# Patient Record
Sex: Male | Born: 1935 | Race: White | Hispanic: No | Smoking: Former smoker
Health system: Southern US, Community
[De-identification: ages and names within clinical notes are randomized; demographics above are authoritative.]

## PROBLEM LIST (undated history)

## (undated) DIAGNOSIS — K219 Gastro-esophageal reflux disease without esophagitis: Secondary | ICD-10-CM

## (undated) DIAGNOSIS — I639 Cerebral infarction, unspecified: Secondary | ICD-10-CM

## (undated) DIAGNOSIS — E559 Vitamin D deficiency, unspecified: Secondary | ICD-10-CM

## (undated) DIAGNOSIS — M1990 Unspecified osteoarthritis, unspecified site: Secondary | ICD-10-CM

## (undated) DIAGNOSIS — G459 Transient cerebral ischemic attack, unspecified: Secondary | ICD-10-CM

## (undated) DIAGNOSIS — E291 Testicular hypofunction: Secondary | ICD-10-CM

## (undated) DIAGNOSIS — E785 Hyperlipidemia, unspecified: Secondary | ICD-10-CM

## (undated) DIAGNOSIS — R410 Disorientation, unspecified: Secondary | ICD-10-CM

## (undated) DIAGNOSIS — I10 Essential (primary) hypertension: Secondary | ICD-10-CM

## (undated) DIAGNOSIS — Z87898 Personal history of other specified conditions: Secondary | ICD-10-CM

## (undated) DIAGNOSIS — I6340 Cerebral infarction due to embolism of unspecified cerebral artery: Secondary | ICD-10-CM

## (undated) HISTORY — PX: BACK SURGERY: SHX140

## (undated) HISTORY — DX: Hyperlipidemia, unspecified: E78.5

## (undated) HISTORY — DX: Testicular hypofunction: E29.1

## (undated) HISTORY — DX: Vitamin D deficiency, unspecified: E55.9

## (undated) HISTORY — PX: HERNIA REPAIR: SHX51

---
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
## 2002-02-09 ENCOUNTER — Encounter: Admission: RE | Admit: 2002-02-09 | Discharge: 2002-02-09 | Payer: Self-pay | Admitting: Orthopedic Surgery

## 2002-02-09 ENCOUNTER — Encounter: Payer: Self-pay | Admitting: Orthopedic Surgery

## 2002-02-16 ENCOUNTER — Ambulatory Visit (HOSPITAL_COMMUNITY): Admission: RE | Admit: 2002-02-16 | Discharge: 2002-02-16 | Payer: Self-pay | Admitting: Gastroenterology

## 2002-06-02 ENCOUNTER — Inpatient Hospital Stay (HOSPITAL_COMMUNITY): Admission: RE | Admit: 2002-06-02 | Discharge: 2002-06-03 | Payer: Self-pay | Admitting: Neurosurgery

## 2003-07-06 ENCOUNTER — Encounter: Admission: RE | Admit: 2003-07-06 | Discharge: 2003-07-06 | Payer: Self-pay | Admitting: Orthopedic Surgery

## 2004-05-01 ENCOUNTER — Ambulatory Visit (HOSPITAL_COMMUNITY): Admission: RE | Admit: 2004-05-01 | Discharge: 2004-05-01 | Payer: Self-pay | Admitting: Orthopedic Surgery

## 2004-05-01 ENCOUNTER — Ambulatory Visit (HOSPITAL_BASED_OUTPATIENT_CLINIC_OR_DEPARTMENT_OTHER): Admission: RE | Admit: 2004-05-01 | Discharge: 2004-05-01 | Payer: Self-pay | Admitting: Orthopedic Surgery

## 2005-03-18 ENCOUNTER — Encounter: Admission: RE | Admit: 2005-03-18 | Discharge: 2005-03-18 | Payer: Self-pay | Admitting: Orthopedic Surgery

## 2005-03-31 ENCOUNTER — Encounter: Admission: RE | Admit: 2005-03-31 | Discharge: 2005-03-31 | Payer: Self-pay | Admitting: Orthopedic Surgery

## 2005-04-02 ENCOUNTER — Ambulatory Visit (HOSPITAL_BASED_OUTPATIENT_CLINIC_OR_DEPARTMENT_OTHER): Admission: RE | Admit: 2005-04-02 | Discharge: 2005-04-02 | Payer: Self-pay | Admitting: Orthopedic Surgery

## 2006-10-31 IMAGING — CR DG CHEST 2V
2 series · 2 of 2 positions shown · non-contrast
Comparison: [HOSPITAL], 05/31/02.

CLINICAL DATA: Hypertension.  Preop respiratory exam for tendinopathy.
 TWO VIEW CHEST:

[w chest pa]
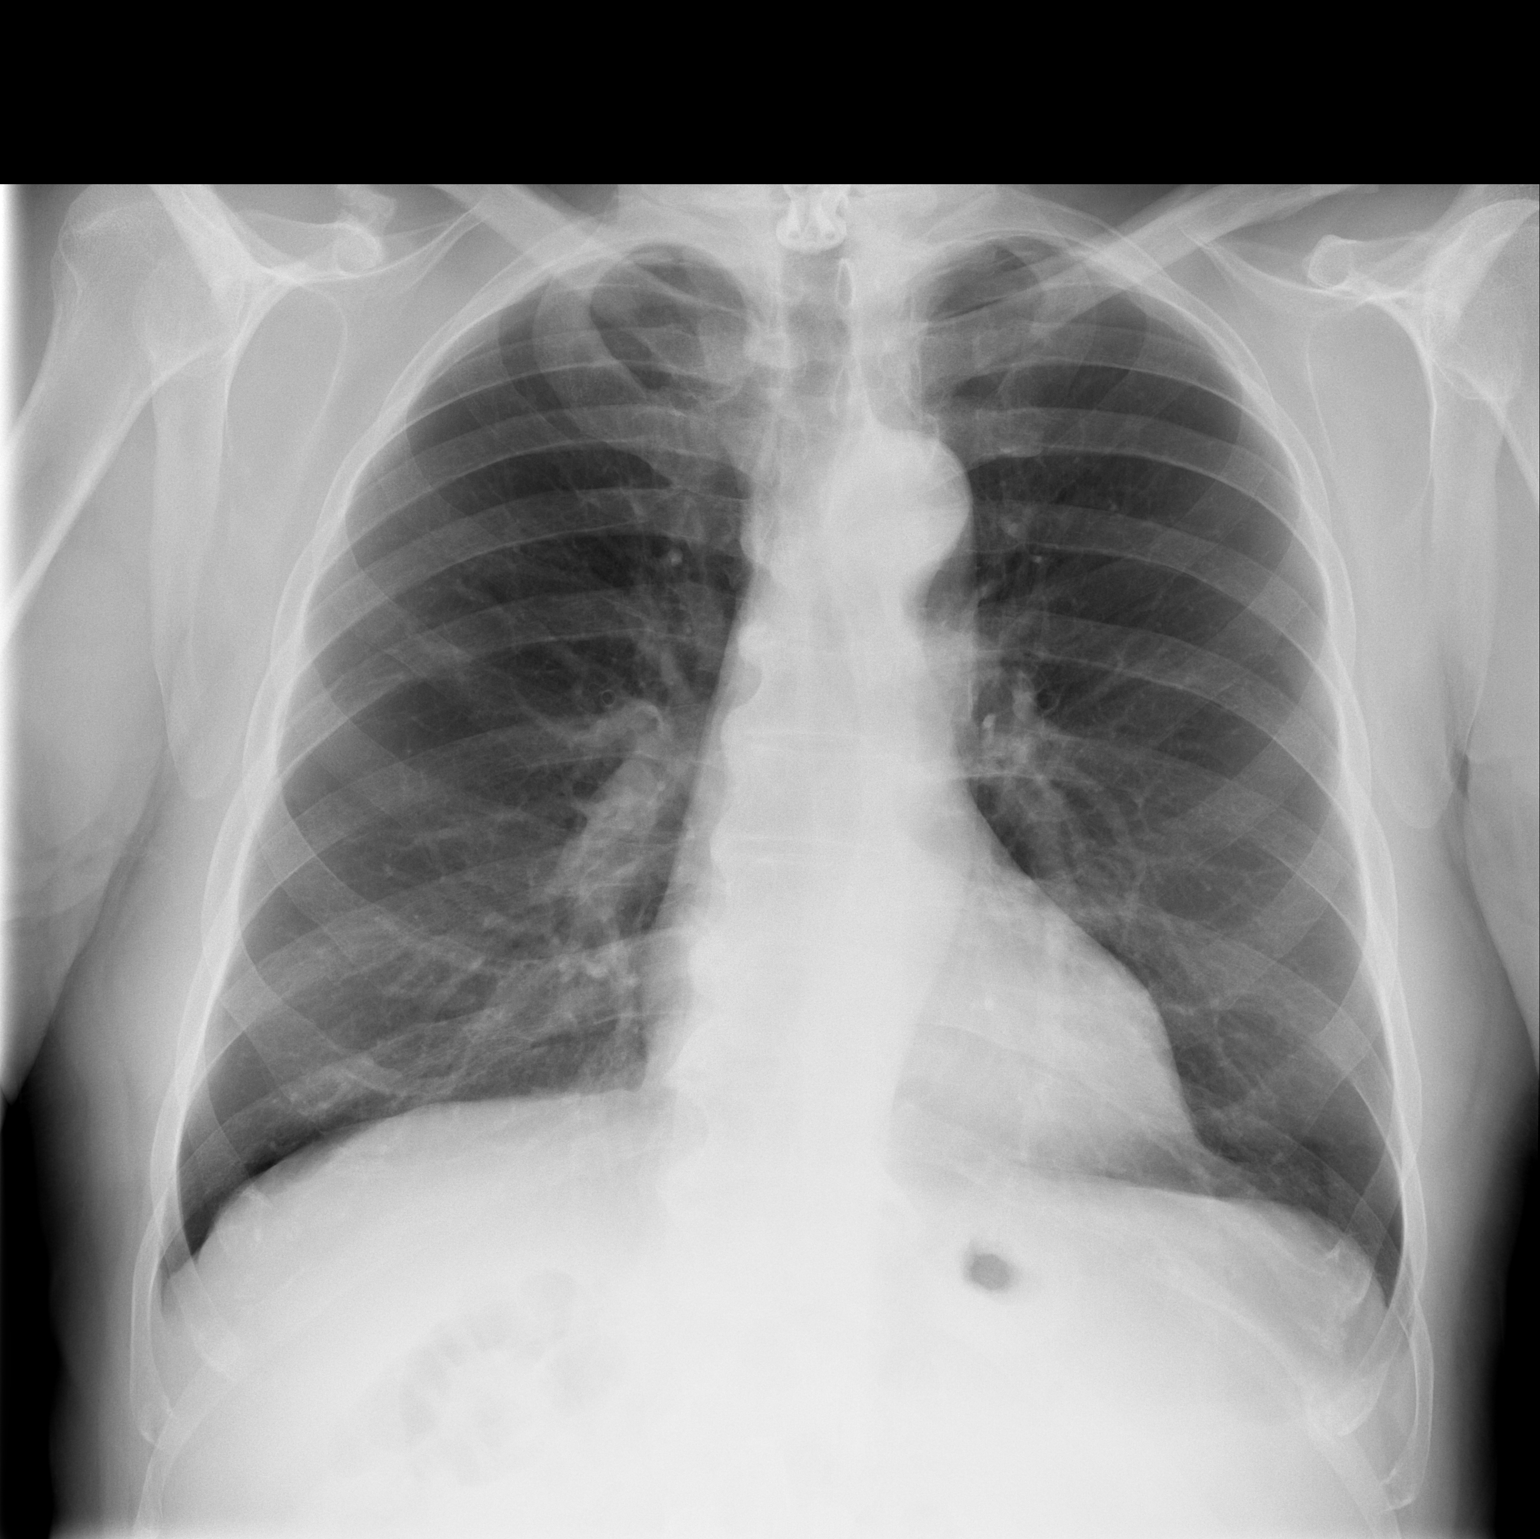

[w chest lat]
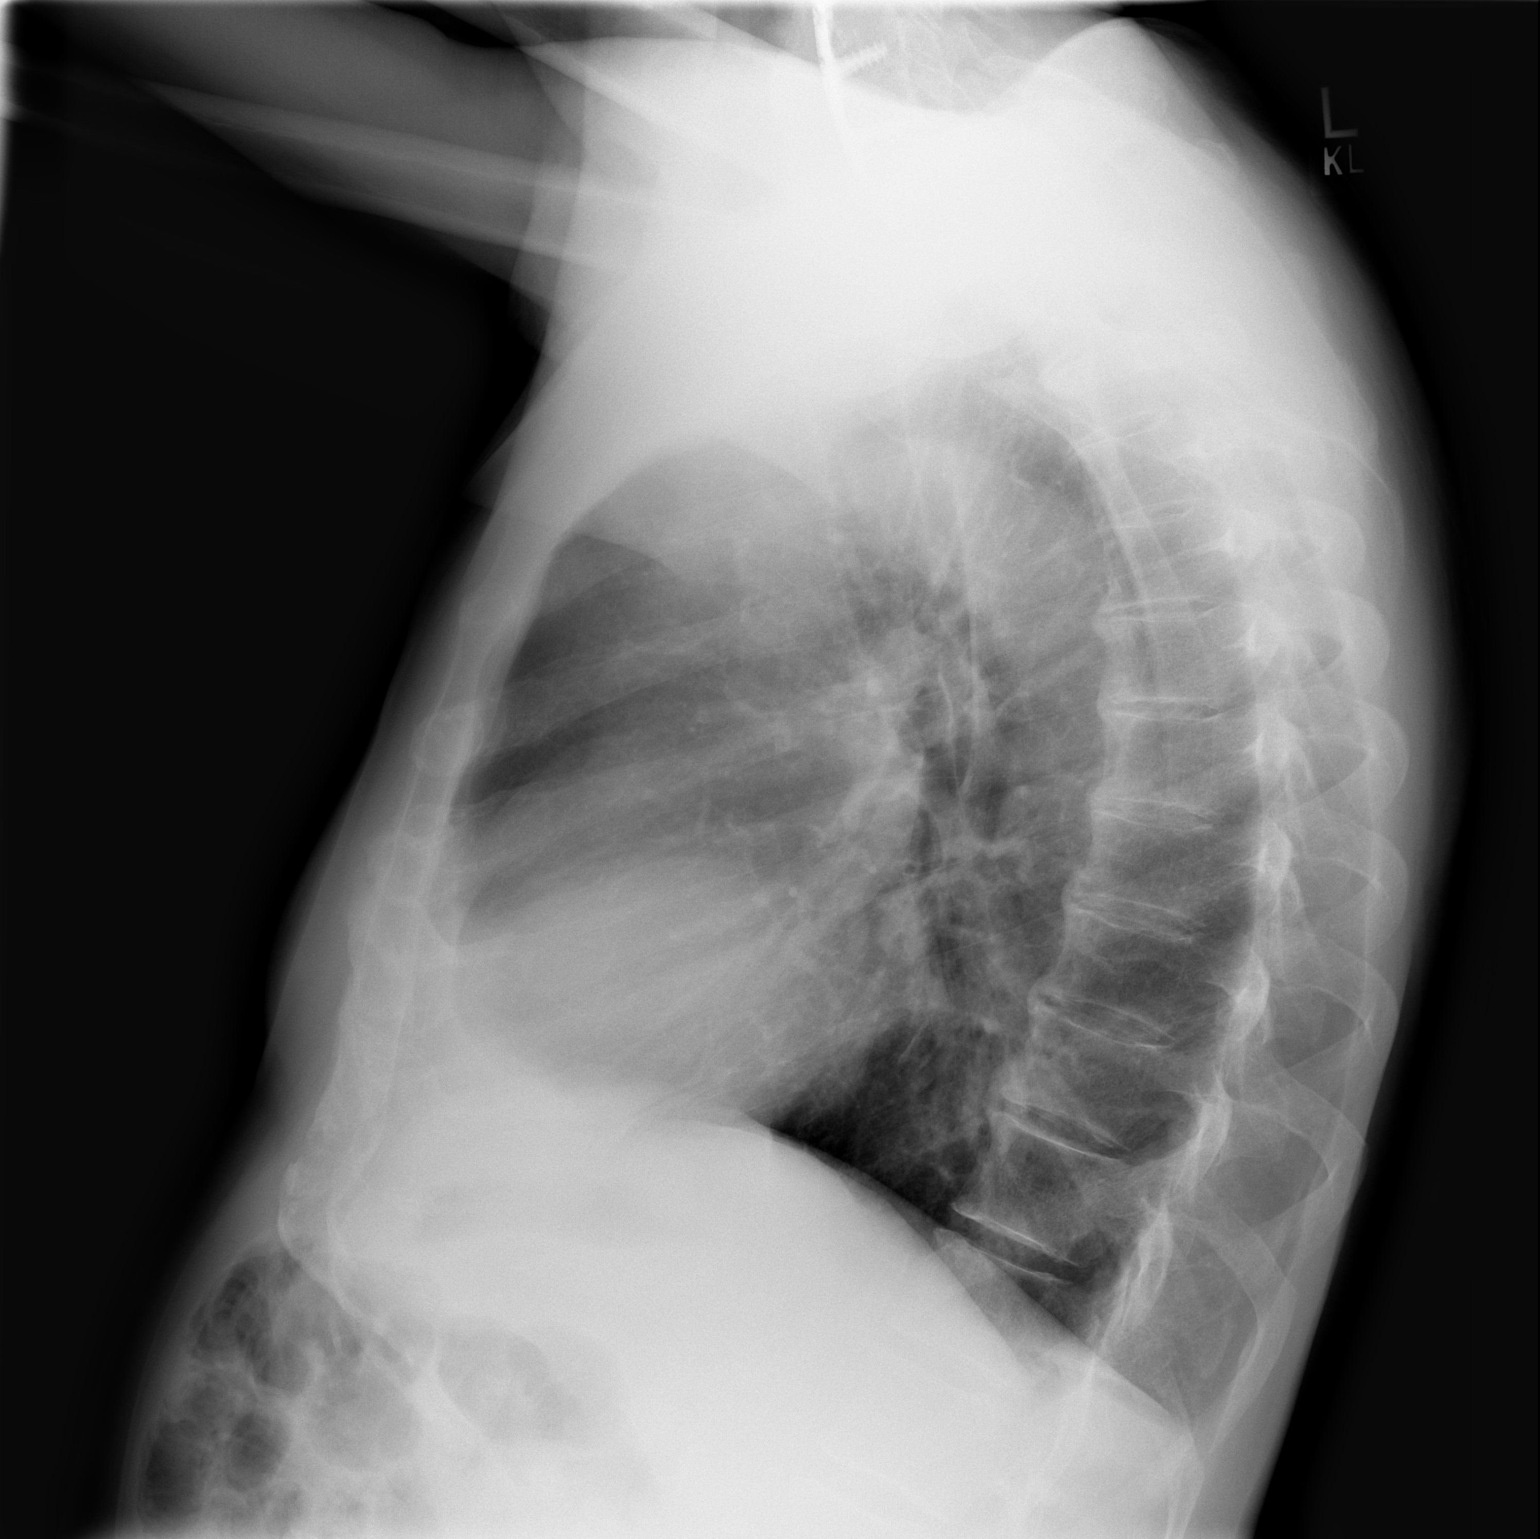

[2 of 2 positions shown; findings below may reference images not displayed]

The lungs are clear, and the heart and mediastinal structures are normal.  There has been a previous lower cervical fusion intervally.
IMPRESSION: No evidence for active chest disease.

## 2007-06-29 LAB — HM COLONOSCOPY

## 2009-08-16 ENCOUNTER — Encounter: Admission: RE | Admit: 2009-08-16 | Discharge: 2009-08-16 | Payer: Self-pay | Admitting: Orthopaedic Surgery

## 2010-03-31 ENCOUNTER — Encounter: Payer: Self-pay | Admitting: Orthopaedic Surgery

## 2010-07-26 NOTE — Op Note (Signed)
[name], Lance Ramirez               ACCOUNT NO.:  [id number]   MEDICAL RECORD NO.:  [id number]          PATIENT TYPE:  AMB   LOCATION:  [place]                          FACILITY:  [facility]   PHYSICIAN:  Estill Bamberg. Ronnie Derby, M.D. DATE OF BIRTH:  1933-05-30   DATE OF PROCEDURE:  05/01/2004  DATE OF DISCHARGE:                                 OPERATIVE REPORT   PREOPERATIVE DIAGNOSIS:  Right shoulder impingement syndrome with labral  tearing.   POSTOPERATIVE DIAGNOSIS:  Right shoulder impingement syndrome with labral  tearing.   OPERATION PERFORMED:  Right shoulder arthroscopy with glenohumeral  debridement, subacromial decompression and distal clavicle resection.   SURGEON:  Estill Bamberg. Ronnie Derby, M.D.   ASSISTANT:  None.   ANESTHESIA:  General plus preoperative interscalene block.   INDICATIONS FOR PROCEDURE:  The patient is a 77 year old white male with  failure of conservative management for shoulder impingement syndrome and MRI  evidence of shoulder pathology including labral tearing, rotator cuff  tendinosis and acromioclavicular joint changes.  Informed consent was  obtained.   DESCRIPTION OF PROCEDURE:  The patient was taken to the operating room and  administered general anesthesia after a preop interscalene block.  She was  then placed in the beach chair position.  After sterile prep and drape,  anterior, posterior and direct lateral portals were created with a #11  blade, blunt trocar and cannula.  Diagnostic arthroscopy revealed some  anterior labral tearing and detachment.  This was debrided through the  anterior portal and an ArthroCare debridement wand was used to __________  this back to a stable rim.  I did not feel a repair was necessary.  It was  more degenerative and torn from its attachment.  The undersurface of the  rotator cuff appeared normal.  There was some interval tearing which I  debrided as well.  I then redirected the scope from the posterior portal  into the  subacromial space and from the direct lateral portal performed a  bursectomy.  There was an incredible amount of impingement and spurring at  both the acromioclavicular joint and the anterolateral acromion.  A 4.0 mm  cylindrical bur was used to perform an anterolateral acromioplasty and a  distal clavicle resection.  This was very, very aggressive.  He had a very,  very broad coracoacromial ligament as well and this was debrided as well.  This made for an extreme amount of decompression.  After completing the  bursectomy and CA ligament resection and adequate burring, I then evacuated  the fluid and instruments and closed with interrupted nylon, dressed with  Xeroform dressing sponges, sterile Webril and 2 inch silk tape and simple  sling.   COMPLICATIONS:  None.   DRAINS:  None.      SDL/MEDQ  D:  05/01/2004  T:  05/01/2004  Job:  [id number]

## 2010-07-26 NOTE — Op Note (Signed)
Lance Ramirez, Lance Ramirez               ACCOUNT NO.:  [id number]   MEDICAL RECORD NO.:  [id number]          PATIENT TYPE:  AMB   LOCATION:  [place]                          FACILITY:  [facility]   PHYSICIAN:  Estill Bamberg. Ronnie Derby, M.D. DATE OF BIRTH:  1933-04-30   DATE OF PROCEDURE:  04/02/2005  DATE OF DISCHARGE:                                 OPERATIVE REPORT   SURGEON:  Estill Bamberg. Ronnie Derby, M.D.   ASSISTANT:  None.   PREOPERATIVE DIAGNOSIS:  Left shoulder impingement syndrome,  acromioclavicular arthritis, labral tear.   POSTOPERATIVE DIAGNOSIS:  Left shoulder impingement syndrome,  acromioclavicular arthritis, labral tear.   PROCEDURE:  Left shoulder arthroscopy, glenohumeral debridement, subacromial  decompression, distal clavicle resection.   INDICATIONS FOR PROCEDURE:  The patient is a 77 year old with an MRI showing  tendinosus, labral tearing, and radiographic evidence of AC joint arthritis  and clinical evidence of shoulder impingement syndrome.  Informed consent  was obtained.   DESCRIPTION OF PROCEDURE:  The patient was laid supine, administered general  anesthesia, then placed in the beach chair position.  He was administered  interscalene block in the pre-anesthesia holding area, as well.  The left  shoulder was prepped and draped in the usual sterile fashion.  Anterior, posterior, and direct portals were created with a number 11 blade,  blunt trocar, and cannula.  Diagnostic arthroscopy of the glenohumeral joint  revealed minimal arthritis, however, large anterior labral tear which was  very, very degenerative and not repairable.  The great white shaver was used  for the anterior portal to debride it and then the 3 mm capture wand was  used to further debride the tissue back to a blunt, stable rim.  I then went  into the subacromial space from posterior.  I performed a bursectomy, which  there was a lot of bursitis, and I did this with the 3.2 Cuda shaver.  I  then used the  ArthroCare debridement wand to debride the under surface of  the acromion, release the CA ligament, and debride the under surface of the  [facility] joint, as well.  I then used the 4 mm cylindrical bur to perform  aggressive anterolateral acromioplasty as well as a distal clavicle  resection.  I then completed the bursectomy, evaluated the rotator cuff,  there were no full thickness tears, just evidence of some partial tearing.  I then evacuated the subacromial space of fluid and instruments.  I closed  with 4-0 nylon sutures and dressed with Xeroform, dressing sponges, ABDs, 2  inch silk tape, and a simple sling.  Complications none.  Drains none.           ______________________________  Estill Bamberg. Ronnie Derby, M.D.     SDL/MEDQ  D:  04/02/2005  T:  04/02/2005  Job:  [id number]

## 2010-07-26 NOTE — Op Note (Signed)
NAMEBASILIO, SCHLICHTING NO.:  [id number]   MEDICAL RECORD NO.:  [id number]                   PATIENT TYPE:  INP   LOCATION:  3010                                 FACILITY:  [facility]   PHYSICIAN:  Ophelia Charter, M.D.            DATE OF BIRTH:  27-Feb-1934   DATE OF PROCEDURE:  DATE OF DISCHARGE:  06/03/2002                                 OPERATIVE REPORT   BRIEF HISTORY:  The patient is a 77 year old white male who has suffered  from neck and left shoulder and arm pain.  He failed medical management and  was worked up by cervical MRI which demonstrated spondylosis, herniated  disk, stenosis centered at C4-5, C5-6 and C6-7.  I discussed the various  treatment options with him including surgery.  The patient weighed the  risks, benefits and alternatives of surgery and decided to proceed with  anterior cervical discectomy, fusion and plating.   PREOPERATIVE DIAGNOSIS:  C4-5, C5-6 and C6-7 herniated nucleus pulposus with  spondylosis, stenosis, cervical radiculopathy and cervicalgia.   POSTOPERATIVE DIAGNOSIS:  C4-5, C5-6 and C6-7 herniated nucleus pulposus  with spondylosis, stenosis, cervical radiculopathy and cervicalgia.   PROCEDURE:  C4-5, C5-6 and C6-7 extensive anterior cervical  diskectomy/decompression; interbody iliac crest Allograft arthrodesis;  anterior cervical plating C4 to C7 with Premier titanium plate and screws.   SURGEON:  Ophelia Charter, M.D.   ASSISTANT:  None.   ANESTHESIA:  General endotracheal anesthesia.   ESTIMATED BLOOD LOSS:  150 mL.   SPECIMENS:  None.   DRAINS:  None.   COMPLICATIONS:  None.   DESCRIPTION OF PROCEDURE:  The patient was brought to the operating room by  the anesthesia team.  General endotracheal anesthesia was induced.  The  patient remained in the supine position.  His anterior cervical region was  then prepared with Betadine scrub and Betadine solution and sterile drapes  were applied.  I  then injected the area to be incised with Marcaine with  epinephrine solution. I used a scalpel to make a transverse incision in the  patient's left anterior neck.  I then used Metzenbaum scissors to divide the  platysma muscle and then dissected medial to the sternocleidomastoid muscle,  the jugular vein and carotid artery.  I bluntly dissected down towards the  anterior cervical spine.  I carefully identified the esophagus and retracted  it medially and then probed the soft tissue of the anterior cervical spine  using Kitner swabs.  I then inserted a bent spinal needle at the uppermost  interspace and took an intraoperative radiograph to confirm my location.   I then used electrocautery to detach the medial border of the longus coli  muscle bilaterally and entered the disk space at C4-5, C5-6 and C6-7 and  then inserted the Caspar self retaining retractor for exposure.  I began at  C5-6 and I incised the C5-6 intervertebral disk and performed  a partial  diskectomy using pituitary forceps and the Carlens curettes.  I then  inserted distraction screws at C5 and C6 and distracted the interspace and  then used a high speed drill to drill away some retrospondylosis, drill away  the intervertebral disk, decorticate the vertebral end plates, and then to  thin out the posterior longitudinal ligament and remove some post  spondylosis.  I then incised the ligament with the arachnoid knife and then  removed it with the Kerrison punch.  After cutting the vertebral end plates  at 075-GRM  and decompressing the thecal sac at this level, I then performed a  generous foraminotomy about the bilateral  5-6 nerve root completing  decompression at this level.   This procedure was repeated in an identical fashion first at C6-7 and at C4-  5 decompressing both of these levels.   I now turned my attention to the arthrodesis.  I obtained iliac crest  Allograft bone graft and fashioned it to these approximate  dimensions:  7 mm  in height, 1 cm in depth.  I inserted the first bone graft into the  distracted C4-5 interspace and then removed the distraction screw from C4  and placed it to C6, distracted the C5-6, placed the second bone graft at C5-  6 and then removed the distraction screw from C5, placed it in C7,  distracted the C6-7, placed a third bone graft in the C6-7 interspace.  I  then removed the distraction screws and there was a good snug fit of the  bone graft at each level.   I now turned my attention to the anterior __________.  I used the drill to  draw away some retrospondylosis from the vertebral end plates so that the  plate would lay down flat.  I then selected the appropriate size Premier  anterior cervical plate, laid it along the anterior aspect to lie from C4 to  C7 with two holes at each level and then secured the plate to the vertebral  bodies by placing two 14 mm screws at each level, i.e., to 4, 5, 6 and 7.  I  then slid the locking mechanism over the screws and secured it in place. I  then obtained an intraoperative radiograph that demonstrated good position  of the plates and screws.  There was limited visualization at the bottom  based screws because of the patient's body habitus, but looked good in vivo.  I then achieved hemostasis with bipolar electrocautery and Gelfoam.  I  copiously irrigated the wound out with antibiotic solution and removed the  solution and removed the Caspar self retaining retractor.  I then  reapproximated the patient's platysma muscle with interrupted 3-0 Vicryl  sutures, subcutaneous tissues with interrupted 3-0 Vicryl suture and the  skin with Steri-Strips and Benzoin.  The wound was then covered with  Bacitracin ointment and a sterile dressing was applied.  Anesthesia was  removed and the patient was subsequently extubated by the anesthesia team  and transferred to the post-anesthesia care unit in stable condition. Sponge, instrument  and needle counts were correct at the end of the case.                                                Ophelia Charter, M.D.    JDJ/MEDQ  D:  06/02/2002  T:  06/03/2002  Job:  [id number]

## 2010-07-26 NOTE — Op Note (Signed)
   Lance Ramirez, Lance Ramirez                           ACCOUNT NO.:  [id number]   MEDICAL RECORD NO.:  [id number]                   PATIENT TYPE:  AMB   LOCATION:  [place]                                 FACILITY:  [facility]   PHYSICIAN:  Ronald Lobo, M.D.                DATE OF BIRTH:  [birth year]   DATE OF PROCEDURE:  02/16/2002  DATE OF DISCHARGE:                                 OPERATIVE REPORT   PROCEDURE:  Colonoscopy.   INDICATIONS:  Follow-up of small adenomatous polyp removed five years ago.   FINDINGS:  Normal exam to the cecum.   DESCRIPTION OF PROCEDURE:  The nature, purpose, and risks of the procedure  were familiar to the patient from prior examination, and he provided written  consent.  Sedation was fentanyl 80 mcg and Versed 10 mg IV without  arrhythmias or desaturation.  The Olympus adult video colonoscope was  advanced to the cecum without significant difficulty, turning the patient  into the supine position to drop the tip of the scope into the cecum.   Digital exam of the prostate was unremarkable.   Sedation for this procedure was fentanyl 80 mg and Versed 10 mg IV without  arrhythmias or desaturation.   The exam was normal.  The quality of the prep was excellent, so it is felt  that all areas were well-seen, and I did not detect any polyps, cancer,  colitis, vascular malformations, or diverticulosis.  Retroflexion in the  rectum as well as reinspection of the rectum was unremarkable.   No biopsies were obtained.  The patient tolerated the procedure well, and  there were no apparent complications.   IMPRESSION:  Normal surveillance colonoscopy in a patient with a prior  history of a small colic adenoma.   PLAN:  Follow up colonoscopy in five years.                                               Ronald Lobo, M.D.    RB/MEDQ  D:  02/16/2002  T:  02/16/2002  Job:  [id number]   cc:   Unk Pinto, M.D.  [address], [place]  [place], [place] [address]  Fax: [phone]

## 2010-07-26 NOTE — Op Note (Signed)
Lance Ramirez, Lance Ramirez               ACCOUNT NO.:  [id number]   MEDICAL RECORD NO.:  [id number]          PATIENT TYPE:  AMB   LOCATION:  [place]                          FACILITY:  [facility]   PHYSICIAN:  Estill Bamberg. Ronnie Derby, M.D. DATE OF BIRTH:  [birth year]   DATE OF PROCEDURE:  04/21/2005  DATE OF DISCHARGE:  04/02/2005                                 OPERATIVE REPORT   PREOPERATIVE DIAGNOSIS:  Left shoulder impingement syndrome.   POSTOPERATIVE DIAGNOSIS:  Left shoulder impingement syndrome.   OPERATION PERFORMED:  Left shoulder arthroscopy, subacromial decompression  and debridement.   SURGEON:  Estill Bamberg. Ronnie Derby, M.D.   ASSISTANT:  None.   ANESTHESIA:  General.   INDICATIONS FOR PROCEDURE:  The patient is a 77 year old white male with  failure of conservative measures for shoulder impingement syndrome.  Informed consent was obtained.   DESCRIPTION OF PROCEDURE:  The patient was laid supine and administered  general anesthesia after preop interscalene block.  He was then set in the  beach chair position and the shoulder was prepped and draped in the usual  sterile fashion.  Anterior and posterior and direct lateral portals were  created after a sterile prep and drape.  This was done with a knife and  blunt trocars.  Glenohumeral arthroscopy revealed some chondromalacia with  some degenerative labral tearing and at the anterior portal, a small Cuda  shaver was inserted and debridement was performed.  I then went into the  subacromial space from the posterior port.  I performed bursectomy from the  lateral portal.  I then performed aggressive anterolateral acromioplasty and  CA ligament release with a 4.0 mm cylindrical bur and the ArthroCare  debridement wand sequentially.  I then debrided the undersurface of the [facility]  joint as well and this afforded excellent decompression.  I then irrigated  and closed with 4-0 nylon suture, dressed with Adaptic, 4 x 4s, ABDs, 2 inch  silk tape and a  simple sling.   COMPLICATIONS:  None.   DRAINS:  None.           ______________________________  Estill Bamberg. Ronnie Derby, M.D.     SDL/MEDQ  D:  04/21/2005  T:  04/21/2005  Job:  [id number]

## 2011-02-10 ENCOUNTER — Emergency Department (HOSPITAL_COMMUNITY): Payer: Medicare Other

## 2011-02-10 ENCOUNTER — Inpatient Hospital Stay (HOSPITAL_COMMUNITY)
Admission: EM | Admit: 2011-02-10 | Discharge: 2011-02-13 | DRG: 063 | Disposition: A | Discharge status: Home / Self Care | Payer: Medicare Other | Source: Ambulatory Visit | Attending: Neurology | Admitting: Neurology

## 2011-02-10 ENCOUNTER — Encounter: Payer: Self-pay | Admitting: Emergency Medicine

## 2011-02-10 ENCOUNTER — Other Ambulatory Visit: Payer: Self-pay | Admitting: Internal Medicine

## 2011-02-10 ENCOUNTER — Inpatient Hospital Stay (HOSPITAL_COMMUNITY): Payer: Medicare Other

## 2011-02-10 ENCOUNTER — Other Ambulatory Visit: Payer: Self-pay

## 2011-02-10 DIAGNOSIS — Z8673 Personal history of transient ischemic attack (TIA), and cerebral infarction without residual deficits: Secondary | ICD-10-CM

## 2011-02-10 DIAGNOSIS — I6340 Cerebral infarction due to embolism of unspecified cerebral artery: Secondary | ICD-10-CM

## 2011-02-10 DIAGNOSIS — I6350 Cerebral infarction due to unspecified occlusion or stenosis of unspecified cerebral artery: Principal | ICD-10-CM | POA: Diagnosis present

## 2011-02-10 DIAGNOSIS — E785 Hyperlipidemia, unspecified: Secondary | ICD-10-CM | POA: Diagnosis present

## 2011-02-10 DIAGNOSIS — R4701 Aphasia: Secondary | ICD-10-CM | POA: Diagnosis present

## 2011-02-10 DIAGNOSIS — Z7982 Long term (current) use of aspirin: Secondary | ICD-10-CM

## 2011-02-10 DIAGNOSIS — R200 Anesthesia of skin: Secondary | ICD-10-CM

## 2011-02-10 DIAGNOSIS — I10 Essential (primary) hypertension: Secondary | ICD-10-CM | POA: Diagnosis present

## 2011-02-10 DIAGNOSIS — E782 Mixed hyperlipidemia: Secondary | ICD-10-CM | POA: Diagnosis present

## 2011-02-10 HISTORY — DX: Unspecified osteoarthritis, unspecified site: M19.90

## 2011-02-10 HISTORY — DX: Cerebral infarction, unspecified: I63.9

## 2011-02-10 HISTORY — DX: Cerebral infarction due to embolism of unspecified cerebral artery: I63.40

## 2011-02-10 HISTORY — DX: Essential (primary) hypertension: I10

## 2011-02-10 HISTORY — DX: Transient cerebral ischemic attack, unspecified: G45.9

## 2011-02-10 HISTORY — DX: Gastro-esophageal reflux disease without esophagitis: K21.9

## 2011-02-10 LAB — COMPREHENSIVE METABOLIC PANEL
AST: 17 U/L (ref 0–37)
Albumin: 3.2 g/dL — ABNORMAL LOW (ref 3.5–5.2)
BUN: 16 mg/dL (ref 6–23)
CO2: 26 mEq/L (ref 19–32)
Calcium: 8.8 mg/dL (ref 8.4–10.5)
Chloride: 107 mEq/L (ref 96–112)
Creatinine, Ser: 1.14 mg/dL (ref 0.50–1.35)
GFR calc Af Amer: 71 mL/min — ABNORMAL LOW (ref >90)
Glucose, Bld: 150 mg/dL — ABNORMAL HIGH (ref 70–99)
Potassium: 3.6 mEq/L (ref 3.5–5.1)
Total Bilirubin: 0.4 mg/dL (ref 0.3–1.2)
Total Protein: 5.9 g/dL — ABNORMAL LOW (ref 6.0–8.3)

## 2011-02-10 LAB — PROTIME-INR: INR: 1.15 (ref 0.00–1.49)

## 2011-02-10 LAB — MRSA PCR SCREENING: MRSA by PCR: NEGATIVE

## 2011-02-10 LAB — DIFFERENTIAL
Basophils Absolute: 0 10*3/uL (ref 0.0–0.1)
Eosinophils Relative: 1 % (ref 0–5)
Lymphocytes Relative: 17 % (ref 12–46)
Monocytes Absolute: 0.4 10*3/uL (ref 0.1–1.0)
Monocytes Relative: 6 % (ref 3–12)
Neutrophils Relative %: 77 % (ref 43–77)

## 2011-02-10 LAB — CBC
HCT: 40.6 % (ref 39.0–52.0)
RBC: 4.75 MIL/uL (ref 4.22–5.81)
RDW: 14.2 % (ref 11.5–15.5)
WBC: 7.5 10*3/uL (ref 4.0–10.5)

## 2011-02-10 LAB — APTT: aPTT: 28 seconds (ref 24–37)

## 2011-02-10 MED ORDER — ACETAMINOPHEN 650 MG RE SUPP: 650.0000 mg | RECTAL | Status: DC | PRN

## 2011-02-10 MED ORDER — SENNOSIDES-DOCUSATE SODIUM 8.6-50 MG PO TABS
1.0000 | ORAL_TABLET | Freq: Every evening | ORAL | Status: DC | PRN
  Filled 2011-02-10: qty 1

## 2011-02-10 MED ORDER — LABETALOL HCL 5 MG/ML IV SOLN: 10.0000 mg | INTRAVENOUS | Status: DC | PRN

## 2011-02-10 MED ORDER — SENNOSIDES-DOCUSATE SODIUM 8.6-50 MG PO TABS: 1.0000 | ORAL_TABLET | Freq: Every evening | ORAL | Status: DC | PRN

## 2011-02-10 MED ORDER — SODIUM CHLORIDE 0.9 % IV SOLN: INTRAVENOUS | Status: DC

## 2011-02-10 MED ORDER — ALTEPLASE (STROKE) FULL DOSE INFUSION
0.9000 mg/kg | Freq: Once | INTRAVENOUS | Status: AC
  Administered 2011-02-10: 73 mg via INTRAVENOUS
  Filled 2011-02-10: qty 100

## 2011-02-10 MED ORDER — SODIUM CHLORIDE 0.9 % IV SOLN
INTRAVENOUS | Status: DC
  Administered 2011-02-10 – 2011-02-11 (×2): via INTRAVENOUS

## 2011-02-10 MED ORDER — ONDANSETRON HCL 4 MG/2ML IJ SOLN: 4.0000 mg | Freq: Four times a day (QID) | INTRAMUSCULAR | Status: DC | PRN

## 2011-02-10 MED ORDER — PANTOPRAZOLE SODIUM 40 MG IV SOLR: 40.0000 mg | Freq: Every day | INTRAVENOUS | Status: DC

## 2011-02-10 MED ORDER — LABETALOL HCL 5 MG/ML IV SOLN
10.0000 mg | INTRAVENOUS | Status: DC | PRN
  Filled 2011-02-10: qty 8

## 2011-02-10 MED ORDER — PANTOPRAZOLE SODIUM 40 MG IV SOLR
40.0000 mg | Freq: Every day | INTRAVENOUS | Status: DC
  Administered 2011-02-10 – 2011-02-11 (×2): 40 mg via INTRAVENOUS
  Filled 2011-02-10 (×3): qty 40

## 2011-02-10 MED ORDER — ACETAMINOPHEN 325 MG PO TABS: 650.0000 mg | ORAL_TABLET | ORAL | Status: DC | PRN

## 2011-02-10 NOTE — ED Notes (Signed)
ekg given to dr. Roxanne Mins.

## 2011-02-10 NOTE — H&P (Signed)
Admission H&P    Chief Complaint: Right sided weakness/difficulty with speech HPI: Lance Ramirez is an 77 y.o. male who one month ago had a spell of difficulty with speech and right upper extremity complaints while in the basement that resolved spontaneously.   Has a recurrent spell this am and went to see his PCP.  Patient was scheduled for a CD and when he returned home had recurrent symptoms.  EMS was called and while EMS was revaluating the patient the symptoms completely resolved.  Within a few minutes they recurred again and the patient was brought in as a code stroke.  Initial NIHSS was 16.  LSN: [id number] tPA Given: Yes  Past Medical History: Hyperlipidemia, Hypertension  Past Surgical History: Cervical disc surgery Right shoulder surgery  Family History: No history of stroke  Social History:  Quit smoking over 40 year s ago.  No history of alcohol or illicit drug abuse   Allergies: NKDA  Medications Prior to Admission:  Atenolol, Vitamin D, Omega-3, Flax seed, Cozaar, Loniten, Testosterone   ROS: History obtained from the patient and family  General ROS: negative for - chills, fatigue, fever, night sweats, weight gain or weight loss Psychological ROS: negative for - behavioral disorder, hallucinations, memory difficulties, mood swings or suicidal ideation Ophthalmic ROS: complains of vision loss intermittently with watching TV ENT ROS: negative for - epistaxis, nasal discharge, oral lesions, sore throat, tinnitus or vertigo Allergy and Immunology ROS: negative for - hives or itchy/watery eyes Hematological and Lymphatic ROS: negative for - bleeding problems, bruising or swollen lymph nodes Endocrine ROS: negative for - galactorrhea, hair pattern changes, polydipsia/polyuria or temperature intolerance Respiratory ROS: negative for - cough, hemoptysis, shortness of breath or wheezing Cardiovascular ROS: negative for - chest pain, dyspnea on exertion, edema or irregular  heartbeat Gastrointestinal ROS: negative for - abdominal pain, diarrhea, hematemesis, nausea/vomiting or stool incontinence Genito-Urinary ROS: negative for - dysuria, hematuria, incontinence or urinary frequency/urgency Musculoskeletal ROS: negative for - joint swelling or muscular weakness Neurological ROS: as noted in HPI Dermatological ROS: negative for rash and skin lesion changes  Physical Examination: Blood pressure 154/75, pulse 74, temperature 97.7 F (36.5 C), temperature source Oral, resp. rate 19, weight 81.647 kg (180 lb), SpO2 100.00%.  HEENT-  Normocephalic, no lesions, without obvious abnormality.  Normal external eye and conjunctiva.  Normal TM's bilaterally.  Normal auditory canals and external ears. Normal external nose, mucus membranes and septum.  Normal pharynx. Neck supple with no masses, nodes, nodules or enlargement. Cardiovascular - S1, S2 normal Lungs - chest clear, no wheezing, rales, normal symmetric air entry Abdomen - soft, non-tender; bowel sounds normal; no masses,  no organomegaly Extremities - no edema  Neurologic Examination: Mental Status: Alert. Speech unintelligible.  Follows simple commands.  Possibly some right neglect.  Able to follow 3 step commands without difficulty. Cranial Nerves: II: RHH, pupils equal, round, reactive to light and accommodation III,IV, VI: ptosis not present, patient unable to cross midline to the right V,VII: right facial droop, facial light touch sensation normal bilaterally VIII: hearing normal bilaterally IX,X: gag reflex present XI: decreased trapezius strength on the right XII: tongue strength normal  Motor: Right : Upper extremity   0/5    Left:     Upper extremity   5/5  Lower extremity   5-/5     Lower extremity   5/5 Tone and bulk:normal tone throughout; no atrophy noted Sensory: Pinprick and light touch decreased in the right upper extremity.  Intact otherwise  Deep Tendon Reflexes: 2+ and symmetric with  absent AJ's bilaterally Plantars: Right: upgoing   Left: upgoing Cerebellar: normal finger-to-nose on the left upper extemity.  Unable to test in other extremities.  Results for orders placed during the hospital encounter of 02/10/11 (from the past 48 hour(s))  CBC     Status: Abnormal   Collection Time   02/10/11  4:14 PM      Component Value Range Comment   WBC 7.5  4.0 - 10.5 (K/uL)    RBC 4.75  4.22 - 5.81 (MIL/uL)    Hemoglobin 13.5  13.0 - 17.0 (g/dL)    HCT 40.6  39.0 - 52.0 (%)    MCV 85.5  78.0 - 100.0 (fL)    MCH 28.4  26.0 - 34.0 (pg)    MCHC 33.3  30.0 - 36.0 (g/dL)    RDW 14.2  11.5 - 15.5 (%)    Platelets 121 (*) 150 - 400 (K/uL)   DIFFERENTIAL     Status: Normal   Collection Time   02/10/11  4:14 PM      Component Value Range Comment   Neutrophils Relative 77  43 - 77 (%)    Neutro Abs 5.8  1.7 - 7.7 (K/uL)    Lymphocytes Relative 17  12 - 46 (%)    Lymphs Abs 1.3  0.7 - 4.0 (K/uL)    Monocytes Relative 6  3 - 12 (%)    Monocytes Absolute 0.4  0.1 - 1.0 (K/uL)    Eosinophils Relative 1  0 - 5 (%)    Eosinophils Absolute 0.0  0.0 - 0.7 (K/uL)    Basophils Relative 0  0 - 1 (%)    Basophils Absolute 0.0  0.0 - 0.1 (K/uL)    Ct Head Wo Contrast  02/10/2011  *RADIOLOGY REPORT*  Clinical Data: Code stroke.  Sudden onset right-sided weakness and slurred speech.  CT HEAD WITHOUT CONTRAST  Technique:  Contiguous axial images were obtained from the base of the skull through the vertex without contrast.  Comparison: None.  Findings: No acute cortical infarct, hemorrhage, or mass lesion is evident.  Mild generalized atrophy is present.  Hypoattenuation within the pons and midbrain likely reflects white matter disease. This area is prone to artifact.  Mild periventricular white matter hypoattenuation is noted bilaterally adjacent to the frontal horns.  The ventricles are proportionate to the degree of atrophy.  No significant extra-axial fluid collections are present.  The  paranasal sinuses and mastoid air cells are clear.  The osseous skull is intact.  The  IMPRESSION:  1.  No acute intracranial abnormality. 2.  Mild atrophy and white matter disease. 3.  Hypoattenuation the brainstem likely reflects chronic white matter changes.  These results were called by telephone on 02/10/2011  at  04:24 p.m. to  Dr. Doy Mince, who verbally acknowledged these results.  Original Report Authenticated By: Resa Miner. MATTERN, M.D.    Assessment: 77 y.o. male who presents with crescendo TIA's.  Symptoms recurred in transit and id not resolve of difficulty with speech, right sided numbness/weakness, right neglect, right facial and [facility].  CT unremarkable.  TPA administration (risks and benefits) discussed with wife and verbal consent given. TPA administered.    Stroke Risk Factors - hyperlipidemia and hypertension  Plan: 1. HgbA1c, fasting lipid panel 2. MRI, MRA  of the brain without contrast 3. PT consult, OT consult, Speech consult 4. Echocardiogram 5. CTA of the head and neck to be performed to rule  out the possibility of large vessel occlusion and determine the possible benefit of thrombectomy. 6. Risk factor modification 7. Repeat head CT in 24 hours  This patient is critically ill and at significant risk of neurological worsening, death and care requires constant monitoring of vital signs, hemodynamics,respiratory and cardiac monitoring, neurological assessment, discussion with family, other specialists and medical decision making of high complexity. I spent 90 minutes of neurocritical care time  in the care of  this patient.   Alexis Goodell, MD [facility] [phone] 02/10/2011, 4:43 PM

## 2011-02-10 NOTE — ED Provider Notes (Signed)
History     CSN: [id number] Arrival date & time: 02/10/2011  4:04 PM   First MD Initiated Contact with Patient 02/10/11 1610      Chief Complaint  Patient presents with  . Cerebrovascular Accident    (Consider location/radiation/quality/duration/timing/severity/associated sxs/prior treatment) Patient is a 77 y.o. male presenting with neurologic complaint. The history is provided by the patient and the EMS personnel.  Neurologic Problem The primary symptoms include focal weakness and speech change. Primary symptoms do not include headaches, syncope, loss of consciousness, seizures, dizziness, visual change, fever, nausea or vomiting. The symptoms began less than 1 hour ago (at 1530). The episode lasted 20 minutes. The symptoms are waxing and waning. The neurological symptoms are focal.  Weakness began less than 1 hour ago. The weakness is worsening. There is decreased muscle function with maximum physical effort.  There is weakness in these regions/motions: facial muscles (R arm). There is impairment of the following actions: moving eyes and articulating words.  Change in speech began less than 1 hour ago. The speech change is unchanged. Features of the speech change include inability to speak fluently.  Additional symptoms include weakness. Additional symptoms do not include neck stiffness. Medical issues also include hypertension.    No past medical history on file.  No past surgical history on file.  No family history on file.  History  Substance Use Topics  . Smoking status: Not on file  . Smokeless tobacco: Not on file  . Alcohol Use: Not on file      Review of Systems  Unable to perform ROS Constitutional: Negative for fever and chills.  HENT: Negative for neck pain and neck stiffness.   Eyes: Negative for visual disturbance.  Respiratory: Negative for cough and shortness of breath.   Cardiovascular: Negative for chest pain, palpitations and syncope.    Gastrointestinal: Negative for nausea and vomiting.  Musculoskeletal: Negative for myalgias and arthralgias.  Skin: Negative for color change and rash.  Neurological: Positive for speech change, focal weakness, facial asymmetry and weakness. Negative for dizziness, seizures, loss of consciousness, light-headedness and headaches.  Psychiatric/Behavioral: Negative for confusion.  All other systems reviewed and are negative.    Allergies  Review of patient's allergies indicates not on file.  Home Medications  No current outpatient prescriptions on file.  Wt 180 lb (81.647 kg)  Physical Exam  Nursing note and vitals reviewed. Constitutional: He appears well-developed and well-nourished.  HENT:  Head: Normocephalic and atraumatic.  Eyes: EOM are normal. Pupils are equal, round, and reactive to light.  Cardiovascular: Normal rate and regular rhythm.   Pulmonary/Chest: Effort normal and breath sounds normal. No respiratory distress.  Abdominal: Soft. There is no tenderness.  Neurological: He is alert. A cranial nerve deficit (R facial droop, unable to look past midline towards R) is present. He exhibits abnormal muscle tone (No strength R arm, mildly decreased R leg). GCS eye subscore is 4. GCS verbal subscore is 4. GCS motor subscore is 6.  Skin: Skin is warm and dry.  Psychiatric: He has a normal mood and affect.    ED Course  Procedures (including critical care time)  Labs Reviewed  CBC - Abnormal; Notable for the following:    Platelets 121 (*)    All other components within normal limits  COMPREHENSIVE METABOLIC PANEL - Abnormal; Notable for the following:    Glucose, Bld 150 (*)    Total Protein 5.9 (*)    Albumin 3.2 (*)    GFR calc non  Af Amer 61 (*)    GFR calc Af Amer 71 (*)    All other components within normal limits  PROTIME-INR  APTT  DIFFERENTIAL  MRSA PCR SCREENING  HEMOGLOBIN A1C  LIPID PANEL   Ct Head Wo Contrast  02/10/2011  *RADIOLOGY REPORT*   Clinical Data: Headache following TPA  CT HEAD WITHOUT CONTRAST  Technique:  Contiguous axial images were obtained from the base of the skull through the vertex without contrast.  Comparison: Repeat exam 1826 hours compared to earlier study of 12/03/2012at 1608 hours  Findings: Generalized atrophy. Normal ventricular morphology. No midline shift or mass effect. Minimal small vessel chronic ischemic changes of deep cerebral white matter. No intracranial hemorrhage, mass lesion, or evidence of acute infarction. No new extra-axial fluid collections. Atherosclerotic calcification within internal carotid and vertebral arteries at skull base. Visualized paranasal sinuses and mastoid air cells clear. Bones unremarkable peri  IMPRESSION: Atrophy with minimal small vessel chronic ischemic changes of deep cerebral white matter. No acute intracranial abnormalities.  Original Report Authenticated By: Burnetta Sabin, M.D.   Ct Head Wo Contrast  02/10/2011  *RADIOLOGY REPORT*  Clinical Data: Code stroke.  Sudden onset right-sided weakness and slurred speech.  CT HEAD WITHOUT CONTRAST  Technique:  Contiguous axial images were obtained from the base of the skull through the vertex without contrast.  Comparison: None.  Findings: No acute cortical infarct, hemorrhage, or mass lesion is evident.  Mild generalized atrophy is present.  Hypoattenuation within the pons and midbrain likely reflects white matter disease. This area is prone to artifact.  Mild periventricular white matter hypoattenuation is noted bilaterally adjacent to the frontal horns.  The ventricles are proportionate to the degree of atrophy.  No significant extra-axial fluid collections are present.  The paranasal sinuses and mastoid air cells are clear.  The osseous skull is intact.  The  IMPRESSION:  1.  No acute intracranial abnormality. 2.  Mild atrophy and white matter disease. 3.  Hypoattenuation the brainstem likely reflects chronic white matter changes.  These  results were called by telephone on 02/10/2011  at  04:24 p.m. to  Dr. Doy Mince, who verbally acknowledged these results.  Original Report Authenticated By: Resa Miner. MATTERN, M.D.   Dg Chest Portable 1 View  02/11/2011  *RADIOLOGY REPORT*  Clinical Data: Stroke.  PORTABLE CHEST - 1 VIEW  Comparison: 03/31/2005 and 05/31/2002.  Findings: 2200 hours.  There are low lung volumes with mild resulting bibasilar atelectasis.  No edema, confluent airspace opacity, pleural effusion or pneumothorax is present.  The heart size and mediastinal contours are stable.  There are stable postsurgical changes status post lower cervical fusion.  There are stable post-traumatic findings at the right coracoclavicular ligament.  IMPRESSION: Mild bibasilar atelectasis.  No acute cardiopulmonary process identified.  Original Report Authenticated By: Vivia Ewing, M.D.     Date: 02/10/2011  Rate:77  Rhythm: normal sinus rhythm  QRS Axis: normal  Intervals: normal  ST/T Wave abnormalities: nonspecific T wave changes  Conduction Disutrbances:none  Narrative Interpretation: NSR, no signs acute ischemia  Old EKG Reviewed: changes noted some T wave flattening in V5/V6  Diagnosis: Stroke     MDM  77 yo M presents with sudden onset R sided weakness, facial droop, dysphasia at 1530. States he has had several similar episodes in the past, the first one approximately one month ago, which resolved after several minutes. He had sudden onset of the symptoms at about 1530 today, and according to EMS had near  complete resolution of his symptoms, for approximately 5-10 minutes, before having recurrence of his deficits. Since that time, he has not had any improvement in his symptoms. Nerve exam is remarkable for right-sided facial droop, no strength in the right upper extremity, mildly decreased strength in the right lower extremity, and significant dysarthria, making it hard to understand the patient's speech. CT scan was  done and showed no signs of acute intracranial bleed. The neurology team was at bedside, and had a discussion with the patient and family regarding TPA, and the decision was made to proceed with this treatment. The patient was admitted to the neurology service.        Marcelino Scot, MD 02/11/11 [phone]

## 2011-02-10 NOTE — Code Documentation (Signed)
77 year old patient arrived to ED via EMS with stuttering neuro changes  - LSW 1545 patient was at restaurant and had sudden onset right side weakness - EMS stated on way to ED patient had stuttering symptoms of difficult speech and right side weakness - patient arrived to ED 1601  EDP exam [place]  Stroke team arrival 1602 patient to CT scan 1606. tPA delivered to bedside at 1645     .  Patients wife in room - speaking with DR. Reynolds. Wife wishes to wait for more family members to make decision re: tPA.  tPA started at 1658.

## 2011-02-10 NOTE — ED Notes (Signed)
Pt eating at [employer] with his wife when he started slurring his speech and having right sided weakness, went away and then came back 10 minutes later.  ems arrival was not moving at all and then in the ambulance said that it was weird to them that he could hear them but not respond.  Normal for 5- 10 minutes then went back to slurred speech and right sided deficit

## 2011-02-10 NOTE — Progress Notes (Signed)
77 year old male who presented to the emergency department as a code stroke go with onset about 30 minutes prior to arrival of a aphasia and right-sided sided weakness. He was evaluated by the acute stroke team and given thrombolytics and admitted to the hospital.  CRITICAL CARE Performed by: [id number]   Total critical care time: 35 minutes  Critical care time was exclusive of separately billable procedures and treating other patients.  Critical care was necessary to treat or prevent imminent or life-threatening deterioration.  Critical care was time spent personally by me on the following activities: development of treatment plan with patient and/or surrogate as well as nursing, discussions with consultants, evaluation of patient's response to treatment, examination of patient, obtaining history from patient or surrogate, ordering and performing treatments and interventions, ordering and review of laboratory studies, ordering and review of radiographic studies, pulse oximetry and re-evaluation of patient's condition.

## 2011-02-11 ENCOUNTER — Encounter (HOSPITAL_COMMUNITY): Payer: Self-pay | Admitting: Radiology

## 2011-02-11 ENCOUNTER — Inpatient Hospital Stay (HOSPITAL_COMMUNITY): Payer: Medicare Other

## 2011-02-11 DIAGNOSIS — G459 Transient cerebral ischemic attack, unspecified: Secondary | ICD-10-CM

## 2011-02-11 LAB — LIPID PANEL
HDL: 51 mg/dL (ref >39)
LDL Cholesterol: 87 mg/dL (ref 0–99)
Triglycerides: 197 mg/dL — ABNORMAL HIGH (ref <150)
VLDL: 39 mg/dL (ref 0–40)

## 2011-02-11 LAB — HEMOGLOBIN A1C: Hgb A1c MFr Bld: 6 % — ABNORMAL HIGH (ref <5.7)

## 2011-02-11 MED ORDER — ASPIRIN 325 MG PO TABS
325.0000 mg | ORAL_TABLET | Freq: Every day | ORAL | Status: DC
  Administered 2011-02-11 – 2011-02-13 (×3): 325 mg via ORAL
  Filled 2011-02-11 (×3): qty 1

## 2011-02-11 MED ORDER — GADOBENATE DIMEGLUMINE 529 MG/ML IV SOLN
10.0000 mL | Freq: Once | INTRAVENOUS | Status: AC | PRN
  Administered 2011-02-11: 10 mL via INTRAVENOUS

## 2011-02-11 NOTE — Progress Notes (Signed)
0908: Pt's diet recently canceled d/t neuro changes and pt was educated regarding NPO status; however tray received in error. Pt ate small amount. RN removed tray immediately upon recognition, confirmed diet cancellation, and educated pt regarding NPO status.  SLP to perform swallow evaluation. Pt reported no difficulty with eating. Will continue to monitor. NP aware.   Latrelle Dodrill

## 2011-02-11 NOTE — Progress Notes (Signed)
PRELIMINARY  PRELIMINARY  PRELIMINARY  PRELIMINARY  Carotid Duplex completed.    Preliminary report:  Bilateral:  No evidence of hemodynamically significant internal carotid artery stenosis.  Vertebral artery flow is antegrade.     Ralene Cork [facility] 02/11/2011 10:29 AM

## 2011-02-11 NOTE — Progress Notes (Signed)
Speech Language/Pathology Clinical/Bedside Swallow Evaluation Patient Details  Name: Lance Ramirez MRN: [id number] DOB: 08/03/1933 Today's Date: 02/11/2011  Past Medical History:  Past Medical History  Diagnosis Date  . Hypertension   . Stroke   . TIA (transient ischemic attack)     last event approx 3wks ago  . GERD (gastroesophageal reflux disease)   . Arthritis    Past Surgical History:  Past Surgical History  Procedure Date  . Hernia repair   . Back surgery   . 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 did not exhibit any overt s/s of aspiration during assessment.  Observed  throat clear suggestive of decreased airway protection, however, pt. impulsive, and taking large sips.  Wet voicing x1 and slight throat clears due to premature vocalization immediately after swallow.  Pt. appears safe with Regular diet, thin liquids with full supervision to ensure small bites/sips and a slow rate due to impulsivity  Risk for Aspiration: Mild Other Related Risk Factors: Lethargy;Cognitive impairment  Recommendations Solid Consistency: Regular Liquid Consistency: Thin Liquid Administration via: No straw;Cup Medication Administration: Whole meds with puree Supervision: Full supervision/cueing for compensatory strategies Compensations: Small sips/bites;Slow rate Postural Changes and/or Swallow Maneuvers: Seated upright 90 degrees  Prognosis Prognosis for Safe Diet Advancement: Good Barriers to Reach Goals: Cognitive deficits;Language deficits;Behavior  Individuals Consulted Consulted and Agree with Results and Recommendations: Patient;Family member/caregiver  Swallow Study Goals   Pt. Will consume a regular texture diet and thin liquids with min verbal cues for strategies  Swallow Study Prior Functional Status   Pt./family did not report swallow difficulties during last  month's episodes of physical changes/TIA's  General  Date of Onset: 02/10/11 Type of Study: Bedside swallow evaluation Diet Prior to this Study: NPO Behavior/Cognition: Alert;Confused;Lethargic;Cooperative;Impulsive Oral Cavity - Dentition: Adequate natural dentition Vision: Functional for self-feeding Patient Positioning: Upright in bed Baseline Vocal Quality: Normal Volitional Cough: Strong Volitional Swallow: Able to elicit Ice chips: Not tested  Oral Motor/Sensory Function  Labial ROM: Within Functional Limits Labial Symmetry: Within Functional Limits Labial Strength: Within Functional Limits Lingual ROM: Reduced left Lingual Symmetry: Abnormal symmetry left Lingual Strength: Reduced Facial ROM: Within Functional Limits Facial Symmetry: Within Functional Limits Facial Strength: Within Functional Limits Velum: Within Functional Limits Mandible: Within Functional Limits  Consistency Results  Ice Chips Ice chips: Not tested  Thin Liquid Thin Liquid: Impaired Presentation: Self Fed;Cup Pharyngeal  Phase Impairments: Throat Clearing - Immediate  Nectar Thick Liquid Nectar Thick Liquid: Not tested  Honey Thick Liquid Honey Thick Liquid: Not tested  Puree Puree: Within functional limits  Solid Solid: Within functional limits   Dolphus Jenny, SLP student  Orbie Pyo Colvin Caroli.Ed [employer] [phone]  02/11/2011

## 2011-02-11 NOTE — Progress Notes (Signed)
Stroke Team Progress Note  SUBJECTIVE Lance Ramirez is an 77 y.o. male who one month ago had a spell of difficulty with speech and right upper extremity complaints while in the basement that resolved spontaneously. Has a recurrent spell this am and went to see his PCP. Patient was scheduled for a CD and when he returned home had recurrent symptoms. EMS was called and while EMS was revaluating the patient the symptoms completely resolved. Within a few minutes they recurred again and the patient was brought in as a code stroke. Initial NIHSS was 16. And recovered very well after iv TPA. He had recurrent symptoms around 4am today and had stat head CT which was negative for bleed.. No family at the bedside. Overall he feels his condition has worsened this am.. He was made NPO after neuro worsening. Will need ST swallow eval.  OBJECTIVE Most recent Vital Signs: Temp: 97.3 F (36.3 C) (12/04 0400) Temp src: Oral (12/04 0400) BP: 140/61 mmHg (12/04 0800) Pulse Rate: 70  (12/04 0800) Respiratory Rate: 15 O2 Saturdation: 99%  CBG (last 3)  No results found for this basename: GLUCAP:3 in the last 72 hours Intake/Output from previous day: 12/03 0701 - 12/04 0700 In: 1044.7 [I.V.:1044.7] Out: 1300 [Urine:1300]  IV Fluid Intake:     . sodium chloride 75 mL/hr at 02/11/11 0800  . DISCONTD: sodium chloride     Diet:  Cardiac thin liquids  Activity:  Bedrest  DVT Prophylaxis:  SCDs   Studies: CBC    Component Value Date/Time   WBC 7.5 02/10/2011 1614   RBC 4.75 02/10/2011 1614   HGB 13.5 02/10/2011 1614   HCT 40.6 02/10/2011 1614   PLT 121* 02/10/2011 1614   MCV 85.5 02/10/2011 1614   MCH 28.4 02/10/2011 1614   MCHC 33.3 02/10/2011 1614   RDW 14.2 02/10/2011 1614   LYMPHSABS 1.3 02/10/2011 1614   MONOABS 0.4 02/10/2011 1614   EOSABS 0.0 02/10/2011 1614   BASOSABS 0.0 02/10/2011 1614   CMP    Component Value Date/Time   NA 142 02/10/2011 1614   K 3.6 02/10/2011 1614   CL 107 02/10/2011 1614   CO2 26 02/10/2011 1614   GLUCOSE 150* 02/10/2011 1614   BUN 16 02/10/2011 1614   CREATININE 1.14 02/10/2011 1614   CALCIUM 8.8 02/10/2011 1614   PROT 5.9* 02/10/2011 1614   ALBUMIN 3.2* 02/10/2011 1614   AST 17 02/10/2011 1614   ALT 16 02/10/2011 1614   ALKPHOS 43 02/10/2011 1614   BILITOT 0.4 02/10/2011 1614   GFRNONAA 61* 02/10/2011 1614   GFRAA 71* 02/10/2011 1614   COAGS Lab Results  Component Value Date   INR 1.15 02/10/2011   Lipid Panel    Component Value Date/Time   CHOL 177 02/11/2011 0455   TRIG 197* 02/11/2011 0455   HDL 51 02/11/2011 0455   CHOLHDL 3.5 02/11/2011 0455   VLDL 39 02/11/2011 0455   LDLCALC 87 02/11/2011 0455   HgbA1C  No results found for this basename: HGBA1C   Urine Drug Screen  No results found for this basename: labopia, cocainscrnur, labbenz, amphetmu, thcu, labbarb    Alcohol Level No results found for this basename: eth   CT of the brain  02/11/2011   1.  No acute intracranial pathology seen on CT. 2.  Mild cortical volume loss and scattered small vessel ischemic microangiopathy.   02/10/2011 Atrophy with minimal small vessel chronic ischemic changes of deep cerebral white matter. No acute intracranial abnormalities.  02/10/2011 1.  No acute intracranial abnormality. 2.  Mild atrophy and white matter disease. 3.  Hypoattenuation the brainstem likely reflects chronic white matter changes.   MRI of the brain  ordered  MRA of the brain  ordered   2D Echocardiogram  ordered   Carotid Doppler  ordered   CXR   Mild bibasilar atelectasis.  No acute cardiopulmonary process identified.   EKG  Not ordered   Physical Exam    Present middle-aged Caucasian male currently not in distress. Afebrile. Cardiac exam no murmur or gallop. Lungs clear to auscultation. Neck is supple without bruit. Hearing appears normal. Distal pulses are well felt. There is no pedal edema.  Neurological exam awake alert oriented x3. Moderate expressive aphasia with significant word  finding difficulties and nonfluent speech. Good comprehension and repetition. Not able to name parts of objects. I moments are full range without nystagmus. Visual fields seem full to confrontational testing. There is mild right lower facial weakness. Tongue is midline. Motor system exam reveals no upper or lower extremity drift but fine finger movements are diminished on the right. There is mild right with weakness. Orbits left or right upper extremity. Sensation appears intact. There is mild right sensory inattention. Gait was not tested. ASSESSMENT [name] is a 77 y.o. male with crescendo TIA's and likely small left middle cerebral artery branch infarct status post IV TPA with initial improvement but now return of deficits and  CT shows no post TPA hemorrhage.. TPA administered 02/10/11 at 1658.   Stroke risk factors:  hyperlipidemia and hypertension  Hospital day # 1  TREATMENT/PLAN MRI/MRA now and echocardiogram, Doppler studies later . NPO. ST check swallowing. Keep on Bedrest until MRI reviewed. Check EKG. Will need aspirin prior to 12 midnight if MRI negative for hemorrhage.I had a long discussion with the patient's wife about his condition and treatment plan and answered questions.This patient is critically ill and at significant risk of neurological worsening, death and care requires constant monitoring of vital signs, hemodynamics,respiratory and cardiac monitoring, neurological assessment, discussion with family, other specialists and medical decision making of high complexity. I spent 30 minutes of neurocritical care time  in the care of  this patient.   Steffanie Rainwater, ANP-BC, GNP-BC [facility] Pager: [phone] 02/11/2011 8:27 AM  Dr. Antony Contras, [place] Director, has personally reviewed chart, pertinent data, examined the patient and developed the plan of care.

## 2011-02-11 NOTE — Plan of Care (Signed)
Problem: Phase II Progression Outcomes Goal: Tolerating diet Completed Bedside Swallow Evaluation. Recommend Regular, Thin diet no straws

## 2011-02-11 NOTE — Progress Notes (Signed)
02/11/11  9:50 PT Note: Spoke with Burnetta Sabin, NP, who reports pt to remain on bedrest until MRI reviewed.  Will hold PT evaluation for now and follow-up as able.  02/11/2011 Cyndia Bent, PT, DPT [phone]

## 2011-02-11 NOTE — Clinical Documentation Improvement (Signed)
GENERIC DOCUMENTATION CLARIFICATION QUERY  THIS DOCUMENT IS NOT A PERMANENT PART OF THE MEDICAL RECORD  TO RESPOND TO THE THIS QUERY, FOLLOW THE INSTRUCTIONS BELOW:  1. If needed, update documentation for the patient's encounter via the notes activity.  2. Access this query again and click edit on the [employer].  3. After updating, or not, click F2 to complete all highlighted (required) fields concerning your review. Select "additional documentation in the medical record" OR "no additional documentation provided".  4. Click Sign note button.  5. The deficiency will fall out of your InBasket *Please let us know if you are not able to compete this workflow by phone or e-mail (listed below).  Please update your documentation within the medical record to reflect your response to this query.                                                                                        02/11/11   Dear Dr.Elantra Caprara / Associates,  In a better effort to capture your patient's severity of illness, reflect appropriate length of stay and utilization of resources, a review of the patient medical record has revealed the following indicators.   Based on your clinical judgment, please clarify and document in a progress note and/or discharge summary the clinical condition associated with the following supporting information: In responding to this query please exercise your independent judgment.  The fact that a query is asked, does not imply that any particular answer is desired or expected.   "right-sided sided weakness" is documented throughout the patient's record/prgress notes. Please consider the below (if your clinical findings/judgment agree) as you document the patient's diagnosis/condition(s) in the progress note and discharge summary. Thank you!  Possible Clinical Conditions?                                            ("resolved" - may be added when appropriate)  - RT Hemiparesis  - RT  Hemiplegia  - Other condition (please document in the progress notes and/or discharge summary)  - Cannot Clinically determine at this time   Supporting Information:  - Diagnosis: left middle cerebral artery branch infarct  (progress note 12/4)  - H&P: Motor:  Right : Upper extremity 0/5        Left: Upper extremity 5/5              Lower extremity 5-/5              Lower extremity 5/5  - "status post IV TPA with initial improvement but now return of deficits" progress note 12/4   **You may use possible, probable, or suspect with inpatient documentation. possible, probable, suspected diagnoses MUST be documented at the time of discharge   Reviewed: additional documentation in the medical record   Thank You,  Hartley Barefoot ,RN  Clinical Documentation Specialist Health Information Management [place] Email: [email or web address]

## 2011-02-11 NOTE — Plan of Care (Signed)
MD reviewed post-tpa MRI. Per MD, ok to give aspirin.   Lance Ramirez

## 2011-02-11 NOTE — Progress Notes (Signed)
Pt with new neuro changes of slurred/garbled speech, RUE drift, and lethargic hard to arouse. Dr Dohmeier notified and orders given for stat head CT. Pt taken and tolerated well. Will continue to monitor. [place], [place] M

## 2011-02-11 NOTE — ED Provider Notes (Signed)
I saw and evaluated the patient, reviewed the resident's note and I agree with the findings and plan. I reviewed the resident's ECG interpretation and agree with it.  Delora Fuel, MD [id number] [phone]

## 2011-02-12 DIAGNOSIS — I319 Disease of pericardium, unspecified: Secondary | ICD-10-CM

## 2011-02-12 LAB — GLUCOSE, CAPILLARY

## 2011-02-12 MED ORDER — PANTOPRAZOLE SODIUM 40 MG PO TBEC
40.0000 mg | DELAYED_RELEASE_TABLET | Freq: Every day | ORAL | Status: DC
  Administered 2011-02-12: 40 mg via ORAL
  Filled 2011-02-12 (×2): qty 1

## 2011-02-12 MED ORDER — MINOXIDIL 10 MG PO TABS
10.0000 mg | ORAL_TABLET | Freq: Every day | ORAL | Status: DC
  Administered 2011-02-12: 10 mg via ORAL
  Filled 2011-02-12 (×2): qty 1

## 2011-02-12 MED ORDER — LOSARTAN POTASSIUM 50 MG PO TABS
100.0000 mg | ORAL_TABLET | Freq: Every day | ORAL | Status: DC
  Administered 2011-02-12 – 2011-02-13 (×2): 100 mg via ORAL
  Filled 2011-02-12 (×2): qty 2

## 2011-02-12 MED ORDER — ATENOLOL 100 MG PO TABS
100.0000 mg | ORAL_TABLET | Freq: Every day | ORAL | Status: DC
  Administered 2011-02-12 – 2011-02-13 (×2): 100 mg via ORAL
  Filled 2011-02-12 (×2): qty 1

## 2011-02-12 MED ORDER — STROKE: EARLY STAGES OF RECOVERY BOOK
Freq: Once | Status: AC
  Administered 2011-02-12: 10:00:00
  Filled 2011-02-12: qty 1

## 2011-02-12 MED ORDER — OMEGA-3 FATTY ACIDS 1000 MG PO CAPS
2.0000 g | ORAL_CAPSULE | Freq: Every day | ORAL | Status: DC
  Administered 2011-02-12: 2 g via ORAL

## 2011-02-12 MED ORDER — OMEGA-3-ACID ETHYL ESTERS 1 G PO CAPS
1.0000 g | ORAL_CAPSULE | Freq: Every day | ORAL | Status: DC
  Administered 2011-02-12 – 2011-02-13 (×2): 1 g via ORAL
  Filled 2011-02-12 (×2): qty 1

## 2011-02-12 NOTE — Progress Notes (Signed)
Physical Therapy Evaluation Patient Details Name: Keino Wates MRN: [id number] DOB: [birth year] Today's Date: 02/12/2011  PT Assessment/Plan  PT - Assessment/Plan PT Frequency: Min 4X/week Follow Up Recommendations: ? Outpatient PT (will further assess 12/6) Equipment Recommended: None recommended by PT PT Goals  Acute Rehab PT Goals PT Goal Formulation: With patient Time For Goal Achievement: 7 days Pt will Ambulate: >150 feet;Independently;with gait velocity >(comment) ft/second (velocity > 1.9 ft/sec) PT Goal: Ambulate - Progress: Other (comment) Pt will Go Up / Down Stairs: 3-5 stairs;with supervision (no UE assist) PT Goal: Up/Down Stairs - Progress: Other (comment) Additional Goals Additional Goal #1: Score >36 on complete Berg and/or >19 on DGI to demonstrate decr fall risk PT Goal: Additional Goal #1 - Progress: Other (comment)  PT Treatment Precautions/Restrictions  Precautions Precautions: Fall Restrictions Other Position/Activity Restrictions: Instructed pt to call for assist prior to getting up/walking.  Vital Signs See also Vital Signs section; BP max 177/78 after ambulation Mobility (including Balance) Bed Mobility Bed Mobility: Yes Supine to Sit: 6: Modified independent (Device/Increase time);HOB flat ("pumps" legs to gain momentum to come to sit) Transfers Transfers: Yes Sit to Stand: 7: Independent;From bed;From chair/3-in-1;Without upper extremity assist Stand to Sit: 7: Independent;Without upper extremity assist;To chair/3-in-1 Ambulation/Gait Ambulation/Gait: Yes Ambulation/Gait Assistance: 4: Min assist Ambulation/Gait Assistance Details (indicate cue type and reason): min-guard assist; slight veering to left when performing head turns Ambulation Distance (Feet): 175 Feet Assistive device: None Gait Pattern: Within Functional Limits  Posture/Postural Control Posture/Postural Control: Postural limitations Postural Limitations: Static standing pt  initially with majority of weight through his heels/slight posterior lean Berg Balance Test Sit to Stand: Able to stand without using hands and stabilize independently Standing Unsupported: Able to stand safely 2 minutes Sitting with Back Unsupported but Feet Supported on Floor or Stool: Able to sit safely and securely 2 minutes Stand to Sit: Sits safely with minimal use of hands Transfers: Able to transfer safely, minor use of hands Standing Unsupported with Eyes Closed: Able to stand 10 seconds with supervision (slight incr anterior-posterior sway) Standing Ubsupported with Feet Together: Able to place feet together independently and stand 1 minute safely Standing Unsupported, Alternately Place Feet on Step/Stool: Able to complete >2 steps/needs minimal assist (pt with very slow velocity & with LOB post. when lifting LLE) Standing Unsupported, One Foot in Front: Needs help to step but can hold 15 seconds Exercise    End of Session PT - End of Session Equipment Utilized During Treatment: Gait belt Activity Tolerance: Treatment limited secondary to medical complications (Comment) (elevated BP with incr activity) Patient left: in chair;with call bell in reach;with family/visitor present Nurse Communication: Mobility status for ambulation General Behavior During Session: [facility] for tasks performed  Aleysha Meckler 02/12/2011, 11:47 AM Pager [phone]

## 2011-02-12 NOTE — Progress Notes (Signed)
  Echocardiogram 2D Echocardiogram has been performed.  Wyatt Mage, RDCS 02/12/2011, 12:09 PM

## 2011-02-12 NOTE — Plan of Care (Signed)
Problem: Phase II Progression Outcomes Goal: Able to communicate Speech-language-cognitive eval completed.  See full report for details  Cranford Mon.Ed [employer] [phone]  02/12/2011

## 2011-02-12 NOTE — Progress Notes (Signed)
Speech Language/Pathology Speech Language Pathology Evaluation Patient Details Name: Lance Ramirez MRN: [id number] DOB: 30-Jan-1934 Today's Date: 02/12/2011  Problem List:  Patient Active Problem List  Diagnoses  . Cerebral embolism with cerebral infarction  . Hypertension  . Hyperlipidemia    Past Medical History:  Past Medical History  Diagnosis Date  . Hypertension   . Stroke   . TIA (transient ischemic attack)     last event approx 3wks ago  . GERD (gastroesophageal reflux disease)   . Arthritis    Past Surgical History:  Past Surgical History  Procedure Date  . Hernia repair   . Back surgery   . 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  This impairment impacts verbal expression resulted in semantic paraphasias and anomia.  Cognitive deficits marked by impairments in problem solving, working memory, and awareness.   Pt's demonstrates mild awareness of expressive impairments.  Pt. will benefit from ST in the acute care setting to improve verbal expression and cognition. SLP Recommendation/Assessment: Patient will need skilled Speech Lanaguage Pathology Services in the acute care venue to address identified deficits Problem List: Verbal expression;Memory;Thought organization;Problem Solving;Reasoning Therapy Diagnosis: Aphasia;Cognitive Impairments;Speech and Language deficits Type of Aphasia: Broca's (expressive) Plan Speech Therapy Frequency: min 3x week Duration: 2 weeks Treatment/Interventions: Language facilitation;Environmental controls;Cueing hierarchy;Cognitive reorganization;Internal/external aids;Functional tasks;SLP instruction and feedback;Compensatory strategies;Patient/family education Potential to Achieve Goals: Good Recommendation Recommendations for Other  Services: OT consult;PT consult Follow up Recommendations: Inpatient Rehab Equipment Recommended: None recommended by PT Individuals Consulted Consulted and Agree with Results and Recommendations: Patient;Family member/caregiver  SLP Goals    SLP Evaluation Prior Functioning  Prior Functional Status Cognitive/Linguistic Baseline: Within functional limits Type of Home: House Lives With: Spouse Receives Help From: Family Vocation: Retired Public librarian Overall Cognitive Status: Impaired Arousal/Alertness: Awake/alert Orientation Level: Oriented X4 Attention: Focused;Sustained Focused Attention: Appears intact Sustained Attention: Appears intact Memory: Impaired Memory Impairment: Retrieval deficit;Decreased long term memory;Decreased recall of new information;Decreased short term memory Decreased Short Term Memory: Verbal basic;Verbal complex Awareness: Impaired Awareness Impairment: Intellectual impairment;Emergent impairment;Anticipatory impairment Problem Solving: Impaired Problem Solving Impairment: Verbal basic;Functional complex Executive Function: Self Monitoring;Self Correcting;Decision Making Self Monitoring: Impaired Self Monitoring Impairment: Verbal basic Self Correcting: Impaired Self Correcting Impairment: Verbal basic Behaviors: Impulsive Safety/Judgment: Impaired Comprehension  Auditory Comprehension Yes/No Questions: Not tested Commands: Within Functional Limits Conversation: Simple Interfering Components: Processing speed;Working Field seismologist: Barista: Not tested Reading Comprehension Reading Status: Not tested Expression  Expression Primary Mode of Expression: Verbal Verbal Expression Initiation: No impairment Level of Generative/Spontaneous Verbalization: Conversation Naming: Impairment Responsive: 0-25% accurate Confrontation:  Impaired Verbal Errors: Semantic paraphasias Pragmatics: No impairment Effective Techniques: Semantic cues;Phonemic cues Non-Verbal Means of Communication: Not applicable Written Expression Written Expression: Not tested Oral/Motor  Oral Motor/Sensory Function Labial ROM: Within Functional Limits Labial Symmetry: Within Functional Limits Labial Strength: Within Functional Limits Lingual ROM: Reduced left Lingual Symmetry: Abnormal symmetry left Lingual Strength: Reduced Facial ROM: Within Functional Limits Facial Symmetry: Within Functional Limits Facial Strength: Within Functional Limits Velum: Within Functional Limits Mandible: Within Functional Limits  Dolphus Jenny, SLP student  Orbie Pyo Colvin Caroli.Ed [employer] [phone]  02/12/2011

## 2011-02-12 NOTE — Progress Notes (Signed)
Occupational Therapy Evaluation Patient Details Name: Lance Ramirez MRN: [id number] DOB: 1933-05-13 Today's Date: 02/12/2011  Problem List:  Patient Active Problem List  Diagnoses  . Cerebral embolism with cerebral infarction  . Hypertension  . Hyperlipidemia    Past Medical History:  Past Medical History  Diagnosis Date  . Hypertension   . Stroke   . TIA (transient ischemic attack)     last event approx 3wks ago  . GERD (gastroesophageal reflux disease)   . Arthritis    Past Surgical History:  Past Surgical History  Procedure Date  . Hernia repair   . Back surgery   . Amputation 1952    shot himself in toe on accident    OT Assessment/Plan/Recommendation OT Assessment Clinical Impression Statement: Patient physically functioning at or near baseline but continues with expressive difficulties and possibly cognitive deficits impacting safety. Will see patient again tomorrow. OT Recommendation/Assessment: Patient will need skilled OT in the acute care venue OT Problem List: Decreased activity tolerance;Impaired balance (sitting and/or standing);Decreased safety awareness;Decreased cognition OT Therapy Diagnosis : Cognitive deficits OT Plan OT Frequency: Min 2X/week OT Treatment/Interventions: Self-care/ADL training;Cognitive remediation/compensation;Patient/family education OT Recommendation Follow Up Recommendations: None Equipment Recommended: None recommended by OT Individuals Consulted Consulted and Agree with Results and Recommendations: Patient;Family member/caregiver Family Member Consulted: son OT Goals Acute Rehab OT Goals OT Goal Formulation: With patient/family Time For Goal Achievement: 7 days ADL Goals Pt Will Transfer to Toilet: Independently;Regular height toilet ADL Goal: Toilet Transfer - Progress: Other (comment) Pt Will Perform Tub/Shower Transfer: Tub transfer;with modified independence;Ambulation ADL Goal: Tub/Shower Transfer - Progress: Other  (comment) ADL Goal: Additional Goal #1 - Progress: Other (comment) Additional ADL Goal #2: Pt. will independently respond appropriately to 3/3 safety scenarios.  OT Evaluation Precautions/Restrictions  Precautions Precautions: Fall Restrictions Other Position/Activity Restrictions: Instructed pt to call for assist prior to getting up/walking. ADL ADL Eating/Feeding: Simulated Where Assessed - Eating/Feeding: Chair Grooming: Simulated;Supervision/safety Grooming Details (indicate cue type and reason): supervision for safety Where Assessed - Grooming: Standing at sink Upper Body Dressing: Set up;Performed Where Assessed - Upper Body Dressing: Sitting, chair Lower Body Dressing: Supervision/safety;Simulated Where Assessed - Lower Body Dressing: Sit to stand from chair Toilet Transfer: Simulated Toilet Transfer Method: Ambulating Toilet Transfer Equipment:  (simulated EOB to chair with armrests) ADL Comments: patient physically near baseline functioning with mild balance defictis (see PT note for more details). Question full impact of cognitive deficts on safety. Vision/Perception  Vision - History Patient Visual Report: No change from baseline Vision - Assessment Eye Alignment: Within Functional Limits Vision Assessment: Vision tested Additional Comments: Tracking and saccades [facility] Cognition Cognition Arousal/Alertness: Awake/alert Overall Cognitive Status:  (question safety?) Orientation Level: Oriented X4 Sensation/Coordination Sensation Light Touch: Appears Intact Coordination Gross Motor Movements are Fluid and Coordinated: Yes Fine Motor Movements are Fluid and Coordinated: Yes Extremity Assessment RUE Assessment RUE Assessment: Within Functional Limits LUE Assessment LUE Assessment: Within Functional Limits Mobility  Bed Mobility Supine to Sit: 6: Modified independent (Device/Increase time);HOB flat Transfers Sit to Stand: 7: Independent;From bed;From  chair/3-in-1 Stand to Sit: 7: Independent;To chair/3-in-1;Without upper extremity assist End of Session OT - End of Session Equipment Utilized During Treatment: Gait belt Activity Tolerance: Patient limited by fatigue Patient left: in chair;with call bell in reach;with family/visitor present Nurse Communication: Mobility status for transfers;Mobility status for ambulation General Behavior During Session: [facility] for tasks performed   Clemma Johnsen 02/12/2011, 3:34 PM

## 2011-02-12 NOTE — Progress Notes (Signed)
Stroke Team Progress Note  SUBJECTIVE  Lance Ramirez is a 77 y.o. male whose stroke symptoms occurred 2 days ago. He presented with symptoms of difficulty understanding and extremity weakness .  His wife is at the bedside. Overall he feels his condition is gradually improving.   OBJECTIVE Most recent Vital Signs: Temp: 98 F (36.7 C) (12/05 0700) Temp src: Oral (12/05 0700) BP: 150/63 mmHg (12/05 0700) Pulse Rate: 58  (12/05 0700) Respiratory Rate: 20 O2 Saturdation: 96%  CBG (last 3)  No results found for this basename: GLUCAP:3 in the last 72 hours Intake/Output from previous day: 12/04 0701 - 12/05 0700 In: 2357.5 [P.O.:520; I.V.:1837.5] Out: 2560 [Urine:2560]  IV Fluid Intake:     . sodium chloride 75 mL/hr at 02/12/11 0700   Diet:  Cardiac thin liquids  Activity:  bedrest  DVT Prophylaxis:  SCDs   Studies: CBC    Component Value Date/Time   WBC 7.5 02/10/2011 1614   RBC 4.75 02/10/2011 1614   HGB 13.5 02/10/2011 1614   HCT 40.6 02/10/2011 1614   PLT 121* 02/10/2011 1614   MCV 85.5 02/10/2011 1614   MCH 28.4 02/10/2011 1614   MCHC 33.3 02/10/2011 1614   RDW 14.2 02/10/2011 1614   LYMPHSABS 1.3 02/10/2011 1614   MONOABS 0.4 02/10/2011 1614   EOSABS 0.0 02/10/2011 1614   BASOSABS 0.0 02/10/2011 1614   CMP    Component Value Date/Time   NA 142 02/10/2011 1614   K 3.6 02/10/2011 1614   CL 107 02/10/2011 1614   CO2 26 02/10/2011 1614   GLUCOSE 150* 02/10/2011 1614   BUN 16 02/10/2011 1614   CREATININE 1.14 02/10/2011 1614   CALCIUM 8.8 02/10/2011 1614   PROT 5.9* 02/10/2011 1614   ALBUMIN 3.2* 02/10/2011 1614   AST 17 02/10/2011 1614   ALT 16 02/10/2011 1614   ALKPHOS 43 02/10/2011 1614   BILITOT 0.4 02/10/2011 1614   GFRNONAA 61* 02/10/2011 1614   GFRAA 71* 02/10/2011 1614   COAGS Lab Results  Component Value Date   INR 1.15 02/10/2011   Lipid Panel    Component Value Date/Time   CHOL 177 02/11/2011 0455   TRIG 197* 02/11/2011 0455   HDL 51 02/11/2011 0455   CHOLHDL 3.5  02/11/2011 0455   VLDL 39 02/11/2011 0455   LDLCALC 87 02/11/2011 0455   HgbA1C  Lab Results  Component Value Date   HGBA1C 6.0* 02/11/2011   Urine Drug Screen  No results found for this basename: labopia, cocainscrnur, labbenz, amphetmu, thcu, labbarb    Alcohol Level No results found for this basename: eth   CT of the brain  02/11/2011 1. No acute intracranial pathology seen on CT. 2. Mild cortical volume loss and scattered small vessel ischemic microangiopathy.  02/10/2011 Atrophy with minimal small vessel chronic ischemic changes of deep cerebral white matter. No acute intracranial abnormalities.  02/10/2011 1. No acute intracranial abnormality. 2. Mild atrophy and white matter disease. 3. Hypoattenuation the brainstem likely reflects chronic white matter changes.   MRI of the brain   1 x 2 cm area of acute infarction affects the left thalamus and posterior limb internal capsule on the left.  No visible hemorrhage.  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 Severe multifocal disease elsewhere as described.   MRA of the neck  Unremarkable MR angiography of the extracranial circulation.   2D Echocardiogram  ordered  Carotid Doppler  No internal carotid artery stenosis bilaterally. Vertebrals  with antegrade flow bilaterally.  CXR  Mild bibasilar atelectasis.  No acute cardiopulmonary process identified.  EKG  ordered.   Physical Exam  Present middle-aged Caucasian male currently not in distress. Afebrile. Cardiac exam no murmur or gallop. Lungs clear to auscultation. Neck is supple without bruit. Hearing appears normal. Distal pulses are well felt. There is no pedal edema.  Neurological exam awake alert oriented x3. Mild expressive aphasia with now only occasional word finding difficulties and nonfluent speech. Good comprehension and repetition. Not able to name parts of objects. Eye moments are full range without nystagmus. Visual fields seem full to  confrontational testing. There is minimal right lower facial weakness. Tongue is midline. Motor system exam reveals no upper or lower extremity drift but fine finger movements are diminished on the right. There is mild right with weakness. Orbits left or right upper extremity. Sensation appears intact. There is mild right sensory inattention. Gait was not tested.  ASSESSMENT [name] is a 77 y.o. male with a left thalamic infarct, secondary to small vessel disease , on aspirin 325 mg orally every day for secondary stroke prevention.  Stroke risk factors:  hyperlipidemia and hypertension  Hospital day # 2  TREATMENT/PLAN Continue aspirin 325 mg orally every day for secondary stroke prevention. Complete 2D. Transfer to the floor. OOB. Therapy evals. D/C IVF. Consider IRIS trial. D/W patient and wife.DC home if ok with therapy.  Steffanie Rainwater, ANP-BC, GNP-BC [facility] Pager: [phone] 02/12/2011 8:06 AM  Dr. Antony Contras, [facility] Director, has personally reviewed chart, pertinent data, examined the patient and developed the plan of care.

## 2011-02-13 ENCOUNTER — Other Ambulatory Visit: Payer: Self-pay

## 2011-02-13 LAB — GLUCOSE, CAPILLARY: Glucose-Capillary: 88 mg/dL (ref 70–99)

## 2011-02-13 MED ORDER — ASPIRIN 325 MG PO TABS: 325.0000 mg | ORAL_TABLET | Freq: Every day | ORAL | Status: AC

## 2011-02-13 NOTE — Discharge Summary (Signed)
Stroke Discharge Summary  Patient ID: Lance Ramirez   MRN: [id number]      DOB: 1933-10-12  Date of Admission: 02/10/2011 Date of Discharge: 02/13/2011  Attending Physician:  Suzzanne Cloud, MD  Consulting Physician(s):     none  Patient's PCP:  No primary provider on file.  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 Hypertension   . Stroke   . TIA (transient ischemic attack)     last event approx 3wks ago  . GERD (gastroesophageal reflux disease)   . Arthritis    Past Surgical History  Procedure Date  . Hernia repair   . Back surgery   . 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   Current Discharge Medication List    START taking these medications   Details  aspirin 325 MG tablet Take 1 tablet (325 mg total) by mouth daily. Qty: 30 tablet, Refills: 12      CONTINUE these medications which have NOT CHANGED   Details  atenolol (TENORMIN) 100 MG tablet Take 100 mg by mouth daily.      Cholecalciferol (VITAMIN D PO) Take 1 tablet by mouth daily.      fish oil-omega-3 fatty acids 1000 MG capsule Take 1 g by mouth daily.      losartan (COZAAR) 100 MG tablet Take 100 mg by mouth daily.      minoxidil (LONITEN) 10 MG tablet Take 10 mg by mouth at bedtime.      testosterone cypionate (DEPOTESTOTERONE CYPIONATE) 100 MG/ML injection Inject 200 mg into the muscle every 6 (six) weeks. For IM use only     Flaxseed, Linseed, (FLAX SEED OIL PO) Take 1 tablet by mouth daily.          Significant Diagnostic Studies:   CT of the brain  02/11/2011 1. No acute intracranial pathology seen on CT. 2. Mild cortical volume loss and scattered small vessel ischemic microangiopathy.    02/10/2011 Atrophy with minimal small vessel chronic ischemic changes of deep cerebral white matter. No acute intracranial abnormalities.  02/10/2011 1. No acute intracranial abnormality. 2. Mild atrophy and white matter disease. 3. Hypoattenuation the brainstem likely reflects chronic white matter changes.   MRI of the brain 1 x 2 cm area of acute infarction affects the left thalamus and posterior limb internal capsule on the left. No visible hemorrhage.   MRA of the brain Complete occlusion of the left posterior cerebral artery. Severe multifocal disease elsewhere as described.   MRA of the neck Unremarkable MR angiography of the extracranial circulation.   2D Echocardiogram EF 55-60% with no source of embolus.   Carotid Doppler No internal carotid artery stenosis bilaterally. Vertebrals with antegrade flow bilaterally.   CXR Mild bibasilar atelectasis. No acute cardiopulmonary process identified.   EKG normal sinus rhythm  Laboratory Studies CBC    Component Value Date/Time   WBC 7.5 02/10/2011 1614   RBC 4.75 02/10/2011 1614   HGB 13.5 02/10/2011 1614   HCT 40.6 02/10/2011 1614   PLT 121* 02/10/2011 1614   MCV 85.5 02/10/2011 1614   MCH 28.4 02/10/2011 1614   MCHC 33.3 02/10/2011 1614   RDW 14.2 02/10/2011 1614   LYMPHSABS  1.3 02/10/2011 1614   MONOABS 0.4 02/10/2011 1614   EOSABS 0.0 02/10/2011 1614   BASOSABS 0.0 02/10/2011 1614   CMP    Component Value Date/Time   NA 142 02/10/2011 1614   K 3.6 02/10/2011 1614   CL 107 02/10/2011 1614   CO2 26 02/10/2011 1614   GLUCOSE 150* 02/10/2011 1614   BUN 16 02/10/2011 1614   CREATININE 1.14 02/10/2011 1614   CALCIUM 8.8 02/10/2011 1614   PROT 5.9* 02/10/2011 1614   ALBUMIN 3.2* 02/10/2011 1614   AST 17 02/10/2011 1614   ALT 16 02/10/2011 1614   ALKPHOS 43 02/10/2011 1614   BILITOT 0.4 02/10/2011 1614   GFRNONAA 61* 02/10/2011 1614   GFRAA 71* 02/10/2011 1614   COAGS Lab Results  Component Value Date   INR 1.15 02/10/2011   Lipid Panel     Component Value Date/Time   CHOL 177 02/11/2011 0455   TRIG 197* 02/11/2011 0455   HDL 51 02/11/2011 0455   CHOLHDL 3.5 02/11/2011 0455   VLDL 39 02/11/2011 0455   LDLCALC 87 02/11/2011 0455   HgbA1C  Lab Results  Component Value Date   HGBA1C 6.0* 02/11/2011   Urine Drug Screen  No results found for this basename: labopia, cocainscrnur, labbenz, amphetmu, thcu, labbarb    Alcohol Level No results found for this basename: eth   History of Present Illness  Lance Ramirez is an 77 y.o. male who one month ago had a spell of difficulty with speech and right upper extremity complaints while in the basement that resolved spontaneously. Has a recurrent spell this am and went to see his PCP. Patient was scheduled for a CD and when he returned home had recurrent symptoms. EMS was called and while EMS was revaluating the patient the symptoms completely resolved. Within a few minutes they recurred again and the patient was brought in as a code stroke. Initial NIHSS was 16. Patient qualified for TPA and it was administered. He was admitted to the neuro ICU for further evaluation.    Hospital Course: The patient tolerated TPA without difficulty. Post tPA imaging showed no acute hemorrhage. He was started on aspirin for secondary stroke prevention. MRI revealed a left thalamic infarct which was felt to be secondary to small vessel disease.   PT OT and speech therapy all evaluated the patient. He has resultant mild expressive aphasia as well as right fine motor and coordination. He has no physical therapy needs post hospitalization. Will arrange outpatient PT and OT.  Patient has stroke risk factors of hyperlipidemia and hypertension (his LDL is within normal limits, his triglycerides are high. He was on Lovaza prior to admission and will be continued on at discharge)  Discharge Exam  Blood pressure 146/81, pulse 68, temperature 97.8 F (36.6 C), temperature source Oral, resp. rate 18, height 5\' 9"  (1.753  m), weight 91 kg (200 lb 9.9 oz), SpO2 93.00%.Present middle-aged Caucasian male currently not in distress. Afebrile. Cardiac exam no murmur or gallop. Lungs clear to auscultation. Neck is supple without bruit. Hearing appears normal. Distal pulses are well felt. There is no pedal edema.  Neurological exam awake alert oriented x3. Mild expressive aphasia with now only occasional word finding difficulties and nonfluent speech. Good comprehension and repetition. Not able to name parts of objects. Eye moments are full range without nystagmus. Visual fields seem full to confrontational testing. There is minimal right lower facial weakness. Tongue is midline. Motor system exam reveals no upper or lower extremity drift  but fine finger movements are diminished on the right. There is mild right with weakness. Orbits left or right upper extremity. Sensation appears intact. There is mild right sensory inattention. Gait steady.  Discharge Diet   Cardiac thin liquids  Discharge Plan   - Disposition:  home - aspirin 325 mg orally every day for secondary stroke prevention. - Ongoing risk factor control by Primary Care Physician. - Risk factor recommendations:  Hypertension target range 130-140/70-80  - Follow-up primary care physician in 1 month. Obtain a primary care physician if there is none - Follow-up with Dr. Antony Contras in 2 months.  Signed Burnetta Sabin, AVNP, ANP-BC, [facility] Nurse Practitioner 02/13/2011, 12:11 PM  Dr. Antony Contras, [place] Director, has personally reviewed chart, pertinent data, examined the patient and developed the plan of care.

## 2011-02-13 NOTE — Progress Notes (Signed)
Occupational Therapy Treatment and Discharge Patient Details Name: Lance Ramirez MRN: [id number] DOB: [birth year] Today's Date: 02/13/2011  OT Assessment/Plan OT Assessment/Plan OT Plan: All goals met and education completed, patient discharged from OT services Equipment Recommended: None recommended by OT OT Goals ADL Goals ADL Goal: Toilet Transfer - Progress: Met ADL Goal: Tub/Shower Transfer - Progress: Met ADL Goal: Additional Goal #1 - Progress: Met  OT Treatment ADL ADL Grooming: Performed;Wash/dry hands;Independent Where Assessed - Grooming: Standing at sink Toilet Transfer: Performed;Independent Toilet Transfer Method: Counselling psychologist: Regular height toilet Toileting - Clothing Manipulation: Performed;Independent Where Assessed - Toileting Clothing Manipulation: Standing Toileting - Hygiene: Performed;Independent Where Assessed - Toileting Hygiene: Standing Tub/Shower Transfer: Performed;Modified independent (steady self on wall) Tub/Shower Transfer Method: Ambulating Tub/Shower Transfer Equipment: Walk in shower Mobility  Bed Mobility Supine to Sit: 7: Independent;HOB flat Transfers Sit to Stand: 7: Independent;From bed;From toilet Stand to Sit: 7: Independent;To bed;To toilet Exercises Patient able to locate map, locate various areas on map and verbalize to therapist how best to get to these areas.   End of Session OT - End of Session Equipment Utilized During Treatment: Gait belt Activity Tolerance: Patient tolerated treatment well (c/o stiffness in bilateral hips- states is from dec activity) Patient left: in bed;with call bell in reach General Behavior During Session: [facility] for tasks performed Cognition: [facility] for tasks performed (required min vc for problem solving when donning socks)  Lance Ramirez  02/13/2011, 1:23 PM

## 2011-02-13 NOTE — Progress Notes (Signed)
D/C instructions provided. Pt verbalized understanding. Pt under no s/s distress.

## 2011-02-24 ENCOUNTER — Ambulatory Visit: Payer: Medicare Other

## 2011-02-24 ENCOUNTER — Ambulatory Visit: Payer: Medicare Other | Admitting: Occupational Therapy

## 2012-02-09 ENCOUNTER — Ambulatory Visit: Payer: Medicare Other | Admitting: Cardiology

## 2012-02-13 ENCOUNTER — Other Ambulatory Visit: Payer: Self-pay | Admitting: Physician Assistant

## 2012-02-13 DIAGNOSIS — I10 Essential (primary) hypertension: Secondary | ICD-10-CM

## 2012-02-19 ENCOUNTER — Ambulatory Visit
Admission: RE | Admit: 2012-02-19 | Discharge: 2012-02-19 | Disposition: A | Discharge status: Home / Self Care | Payer: Medicare Other | Source: Ambulatory Visit | Attending: Physician Assistant | Admitting: Physician Assistant

## 2012-02-19 DIAGNOSIS — I10 Essential (primary) hypertension: Secondary | ICD-10-CM

## 2012-05-08 DIAGNOSIS — I639 Cerebral infarction, unspecified: Secondary | ICD-10-CM

## 2012-05-08 HISTORY — DX: Cerebral infarction, unspecified: I63.9

## 2012-05-14 ENCOUNTER — Encounter (HOSPITAL_COMMUNITY): Payer: Self-pay | Admitting: *Deleted

## 2012-05-14 ENCOUNTER — Emergency Department (HOSPITAL_COMMUNITY): Payer: Medicare Other

## 2012-05-14 ENCOUNTER — Inpatient Hospital Stay (HOSPITAL_COMMUNITY)
Admission: EM | Admit: 2012-05-14 | Discharge: 2012-05-17 | DRG: 066 | Disposition: A | Discharge status: Home / Self Care | Payer: Medicare Other | Attending: Internal Medicine | Admitting: Internal Medicine

## 2012-05-14 DIAGNOSIS — I639 Cerebral infarction, unspecified: Secondary | ICD-10-CM

## 2012-05-14 DIAGNOSIS — I10 Essential (primary) hypertension: Secondary | ICD-10-CM | POA: Diagnosis present

## 2012-05-14 DIAGNOSIS — Z8673 Personal history of transient ischemic attack (TIA), and cerebral infarction without residual deficits: Secondary | ICD-10-CM

## 2012-05-14 DIAGNOSIS — E785 Hyperlipidemia, unspecified: Secondary | ICD-10-CM | POA: Diagnosis present

## 2012-05-14 DIAGNOSIS — F0390 Unspecified dementia without behavioral disturbance: Secondary | ICD-10-CM | POA: Diagnosis present

## 2012-05-14 DIAGNOSIS — R29898 Other symptoms and signs involving the musculoskeletal system: Secondary | ICD-10-CM | POA: Diagnosis present

## 2012-05-14 DIAGNOSIS — I6350 Cerebral infarction due to unspecified occlusion or stenosis of unspecified cerebral artery: Principal | ICD-10-CM | POA: Diagnosis present

## 2012-05-14 DIAGNOSIS — Z87891 Personal history of nicotine dependence: Secondary | ICD-10-CM

## 2012-05-14 DIAGNOSIS — K219 Gastro-esophageal reflux disease without esophagitis: Secondary | ICD-10-CM | POA: Diagnosis present

## 2012-05-14 DIAGNOSIS — IMO0002 Reserved for concepts with insufficient information to code with codable children: Secondary | ICD-10-CM

## 2012-05-14 HISTORY — DX: Cerebral infarction due to embolism of unspecified cerebral artery: I63.40

## 2012-05-14 LAB — COMPREHENSIVE METABOLIC PANEL
ALT: 15 U/L (ref 0–53)
AST: 16 U/L (ref 0–37)
Albumin: 3.6 g/dL (ref 3.5–5.2)
Alkaline Phosphatase: 58 U/L (ref 39–117)
CO2: 28 mEq/L (ref 19–32)
Chloride: 104 mEq/L (ref 96–112)
Creatinine, Ser: 1.19 mg/dL (ref 0.50–1.35)
GFR calc non Af Amer: 58 mL/min — ABNORMAL LOW (ref >90)
Potassium: 3.7 mEq/L (ref 3.5–5.1)
Sodium: 142 mEq/L (ref 135–145)
Total Bilirubin: 0.5 mg/dL (ref 0.3–1.2)

## 2012-05-14 LAB — GLUCOSE, CAPILLARY: Glucose-Capillary: 114 mg/dL — ABNORMAL HIGH (ref 70–99)

## 2012-05-14 LAB — CBC
HCT: 40.3 % (ref 39.0–52.0)
Hemoglobin: 14.1 g/dL (ref 13.0–17.0)
MCHC: 35 g/dL (ref 30.0–36.0)
MCV: 83.4 fL (ref 78.0–100.0)
Platelets: 165 10*3/uL (ref 150–400)
RBC: 4.81 MIL/uL (ref 4.22–5.81)
RDW: 13.4 % (ref 11.5–15.5)
WBC: 4.3 10*3/uL (ref 4.0–10.5)

## 2012-05-14 LAB — CREATININE, SERUM
Creatinine, Ser: 1.23 mg/dL (ref 0.50–1.35)
GFR calc non Af Amer: 55 mL/min — ABNORMAL LOW (ref >90)

## 2012-05-14 MED ORDER — ATORVASTATIN CALCIUM 10 MG PO TABS
10.0000 mg | ORAL_TABLET | Freq: Every day | ORAL | Status: DC
  Administered 2012-05-15 – 2012-05-16 (×2): 10 mg via ORAL
  Filled 2012-05-14 (×3): qty 1

## 2012-05-14 MED ORDER — SENNOSIDES-DOCUSATE SODIUM 8.6-50 MG PO TABS
1.0000 | ORAL_TABLET | Freq: Every evening | ORAL | Status: DC | PRN
  Administered 2012-05-14: 1 via ORAL
  Filled 2012-05-14: qty 1

## 2012-05-14 MED ORDER — ONDANSETRON HCL 4 MG/2ML IJ SOLN: 4.0000 mg | Freq: Four times a day (QID) | INTRAMUSCULAR | Status: DC | PRN

## 2012-05-14 MED ORDER — ASPIRIN 300 MG RE SUPP
300.0000 mg | Freq: Every day | RECTAL | Status: DC
  Filled 2012-05-14 (×2): qty 1

## 2012-05-14 MED ORDER — BUPROPION HCL 75 MG PO TABS
75.0000 mg | ORAL_TABLET | Freq: Every day | ORAL | Status: DC
  Administered 2012-05-15 – 2012-05-17 (×3): 75 mg via ORAL
  Filled 2012-05-14 (×4): qty 1

## 2012-05-14 MED ORDER — ATENOLOL 100 MG PO TABS
100.0000 mg | ORAL_TABLET | Freq: Every day | ORAL | Status: DC
  Administered 2012-05-14 – 2012-05-17 (×4): 100 mg via ORAL
  Filled 2012-05-14 (×4): qty 1

## 2012-05-14 MED ORDER — ASPIRIN 325 MG PO TABS
325.0000 mg | ORAL_TABLET | Freq: Every day | ORAL | Status: DC
  Administered 2012-05-14 – 2012-05-15 (×2): 325 mg via ORAL
  Filled 2012-05-14 (×3): qty 1

## 2012-05-14 MED ORDER — GEMFIBROZIL 600 MG PO TABS
600.0000 mg | ORAL_TABLET | Freq: Two times a day (BID) | ORAL | Status: DC
  Administered 2012-05-15 (×2): 600 mg via ORAL
  Filled 2012-05-14 (×3): qty 1

## 2012-05-14 MED ORDER — HEPARIN SODIUM (PORCINE) 5000 UNIT/ML IJ SOLN
5000.0000 [IU] | Freq: Three times a day (TID) | INTRAMUSCULAR | Status: DC
  Administered 2012-05-14 – 2012-05-17 (×8): 5000 [IU] via SUBCUTANEOUS
  Filled 2012-05-14 (×11): qty 1

## 2012-05-14 MED ORDER — SODIUM CHLORIDE 0.9 % IV SOLN: 250.0000 mL | INTRAVENOUS | Status: DC | PRN

## 2012-05-14 MED ORDER — SODIUM CHLORIDE 0.9 % IJ SOLN: 3.0000 mL | INTRAMUSCULAR | Status: DC | PRN

## 2012-05-14 MED ORDER — BUPROPION HCL 75 MG PO TABS
37.5000 mg | ORAL_TABLET | Freq: Every day | ORAL | Status: DC
  Administered 2012-05-15 – 2012-05-17 (×3): 37.5 mg via ORAL
  Filled 2012-05-14 (×3): qty 0.5

## 2012-05-14 MED ORDER — TESTOSTERONE CYPIONATE 100 MG/ML IM SOLN: 200.0000 mg | INTRAMUSCULAR | Status: DC

## 2012-05-14 MED ORDER — CLOPIDOGREL BISULFATE 75 MG PO TABS
75.0000 mg | ORAL_TABLET | Freq: Every day | ORAL | Status: DC
  Administered 2012-05-14 – 2012-05-17 (×4): 75 mg via ORAL
  Filled 2012-05-14 (×5): qty 1

## 2012-05-14 MED ORDER — LOSARTAN POTASSIUM 50 MG PO TABS
100.0000 mg | ORAL_TABLET | Freq: Every day | ORAL | Status: DC
  Administered 2012-05-14 – 2012-05-16 (×3): 100 mg via ORAL
  Filled 2012-05-14 (×4): qty 2

## 2012-05-14 MED ORDER — ONDANSETRON HCL 4 MG/2ML IJ SOLN: 4.0000 mg | Freq: Three times a day (TID) | INTRAMUSCULAR | Status: DC | PRN

## 2012-05-14 MED ORDER — SODIUM CHLORIDE 0.9 % IJ SOLN
3.0000 mL | Freq: Two times a day (BID) | INTRAMUSCULAR | Status: DC
  Administered 2012-05-14 – 2012-05-16 (×5): 3 mL via INTRAVENOUS

## 2012-05-14 MED ORDER — AMPHETAMINE-DEXTROAMPHETAMINE 10 MG PO TABS
20.0000 mg | ORAL_TABLET | Freq: Two times a day (BID) | ORAL | Status: DC
  Administered 2012-05-15 – 2012-05-17 (×5): 20 mg via ORAL
  Filled 2012-05-14 (×5): qty 2

## 2012-05-14 NOTE — Consult Note (Signed)
Referring Physician: Dr. Sabra Heck    Chief Complaint: visual changes, mental status changes, left sided weakness  HPI: Lance Ramirez is an 79 y.o. male who LKW Tuesday 05/11/2012. That evening he started having visual changes bilaterally that lasted about a half hour. He then went to bed. The next day he remained sleepy all day. He did go to his doctor and was told that his exam was good. Later that evening he showed signs of confusion. At baseline, he does have mild dementia according to the family. On 05/12/04 he was upset that his family did not tell him that a dear friend had died 2 weeks earlier; however the family had told him. Again today, he did not remember this as we discussed this in the room. He knows his age, but not the month. History of previous stroke with question of mild chronic left sided weakness per family. Most of history provided by family. Patient very tearful. Family denies any seizure type events.  Date last known well: 05/11/2012 Time last known well: 2100  tPA Given: No: out of window  Past Medical History  Diagnosis Date  . Hypertension   . Stroke   . TIA (transient ischemic attack)     last event approx 3wks ago  . GERD (gastroesophageal reflux disease)   . Arthritis   . Cerebral embolism with cerebral infarction 02/10/2011    Past Surgical History  Procedure Laterality Date  . Hernia repair    . Back surgery    . 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 Social History:  reports that he quit smoking about 34 years ago. His smoking use included Cigarettes. He smoked 0.00 packs per day. He quit smokeless tobacco use about 34 years ago. His smokeless tobacco use included Chew. He reports that he does not drink alcohol or use illicit drugs.  Allergies: No Known Allergies  No current facility-administered medications for this encounter.   Current Outpatient Prescriptions  Medication Sig Dispense Refill  .  amphetamine-dextroamphetamine (ADDERALL) 20 MG tablet Take 20 mg by mouth 2 (two) times daily.      Marland Kitchen aspirin EC 81 MG tablet Take 81 mg by mouth daily.      Marland Kitchen atenolol (TENORMIN) 100 MG tablet Take 100 mg by mouth daily.        . BUPROPION HCL PO Take 0.5-1 tablets by mouth 2 (two) times daily. 1 tablet in the morning and 0.5 tablet with lunch      . Cholecalciferol (VITAMIN D PO) Take 5,000 Units by mouth daily.       . clopidogrel (PLAVIX) 75 MG tablet Take 75 mg by mouth daily.      Marland Kitchen doxazosin (CARDURA) 4 MG tablet Take 4 mg by mouth at bedtime.      Marland Kitchen gemfibrozil (LOPID) 600 MG tablet Take 600 mg by mouth 2 (two) times daily before a meal.      . hydrochlorothiazide (HYDRODIURIL) 25 MG tablet Take 25 mg by mouth daily.      Marland Kitchen losartan (COZAAR) 100 MG tablet Take 100 mg by mouth at bedtime.       . minoxidil (LONITEN) 10 MG tablet Take 10 mg by mouth at bedtime.       Marland Kitchen testosterone cypionate (DEPOTESTOTERONE CYPIONATE) 100 MG/ML injection Inject 200 mg into the muscle every 6 (six) weeks. For IM use only          ROS:  History obtained from spouse and child, chart review and the patient however he has history of memory problems and is currently tearful  General ROS: negative for - chills, fatigue, fever, night sweats, weight gain or weight loss Psychological ROS: negative for - behavioral disorder, hallucinations, , mood swings or suicidal ideation, +memory difficulties Ophthalmic ROS: negative for - blurry vision, double vision, eye pain,  + visual changes on 05/11/2012 ENT ROS: negative for - epistaxis, nasal discharge, oral lesions, sore throat, tinnitus or vertigo Allergy and Immunology ROS: negative for - hives or itchy/watery eyes Hematological and Lymphatic ROS: negative for - bleeding problems, bruising or swollen lymph nodes Endocrine ROS: negative for - galactorrhea, hair pattern changes, polydipsia/polyuria or temperature intolerance Respiratory ROS: negative for - cough,  hemoptysis, shortness of breath or wheezing Cardiovascular ROS: negative for - chest pain, dyspnea on exertion, edema or irregular heartbeat Gastrointestinal ROS: negative for - abdominal pain, diarrhea, hematemesis, nausea/vomiting or stool incontinence Genito-Urinary ROS: negative for - dysuria, hematuria, incontinence or urinary frequency/urgency Musculoskeletal ROS: negative for - joint swelling or muscular weakness Neurological ROS: as noted in HPI Dermatological ROS: negative for rash and skin lesion changes   Physical Examination: Blood pressure 134/52, pulse 72, temperature 98.8 F (37.1 C), temperature source Oral, resp. rate 18, SpO2 98.00%.  Neurologic Examination: Mental Status: Alert. Knows age. Does not know month.  Speech fluent without evidence of aphasia.  Able to follow 3 step commands without difficulty. Cranial Nerves: II: visual fields grossly normal, pupils equal, round, reactive to light and accommodation III,IV, VI: ptosis not present, extra-ocular motions intact bilaterally V,VII: smile symmetric, facial light touch sensation normal bilaterally VIII: hearing normal bilaterally IX,X: gag reflex present XI: trapezius strength/neck flexion strength normal bilaterally XII: tongue strength normal  Motor: Right : Upper extremity   5/5    Left:     Upper extremity   5/5  Lower extremity   5/5     Lower extremity   5/5 Tone and bulk:normal tone throughout; no atrophy noted Sensory: Pinprick and light touch intact throughout, bilaterally Deep Tendon Reflexes: 2+ and symmetric throughout Plantars: Right: downgoing   Left: downgoing Cerebellar:  normal heel-to-shin test   Results for orders placed during the hospital encounter of 05/14/12 (from the past 48 hour(s))  GLUCOSE, CAPILLARY     Status: Abnormal   Collection Time    05/14/12  1:51 PM      Result Value Range   Glucose-Capillary 114 (*) 70 - 99 mg/dL   Comment 1 Documented in Chart     Comment 2 Notify  RN    CBC     Status: None   Collection Time    05/14/12  2:00 PM      Result Value Range   WBC 4.3  4.0 - 10.5 K/uL   RBC 5.00  4.22 - 5.81 MIL/uL   Hemoglobin 14.8  13.0 - 17.0 g/dL   HCT 41.7  39.0 - 52.0 %   MCV 83.4  78.0 - 100.0 fL   MCH 29.6  26.0 - 34.0 pg   MCHC 35.5  30.0 - 36.0 g/dL   RDW 13.4  11.5 - 15.5 %   Platelets 165  150 - 400 K/uL  COMPREHENSIVE METABOLIC PANEL     Status: Abnormal   Collection Time    05/14/12  2:00 PM      Result Value Range   Sodium 142  135 - 145 mEq/L   Potassium 3.7  3.5 - 5.1  mEq/L   Chloride 104  96 - 112 mEq/L   CO2 28  19 - 32 mEq/L   Glucose, Bld 116 (*) 70 - 99 mg/dL   BUN 16  6 - 23 mg/dL   Creatinine, Ser 1.19  0.50 - 1.35 mg/dL   Calcium 9.5  8.4 - 10.5 mg/dL   Total Protein 6.5  6.0 - 8.3 g/dL   Albumin 3.6  3.5 - 5.2 g/dL   AST 16  0 - 37 U/L   ALT 15  0 - 53 U/L   Alkaline Phosphatase 58  39 - 117 U/L   Total Bilirubin 0.5  0.3 - 1.2 mg/dL   GFR calc non Af Amer 58 (*) >90 mL/min   GFR calc Af Amer 67 (*) >90 mL/min   Comment:            The eGFR has been calculated     using the CKD EPI equation.     This calculation has not been     validated in all clinical     situations.     eGFR's persistently     <90 mL/min signify     possible Chronic Kidney Disease.  PROTIME-INR     Status: None   Collection Time    05/14/12  2:00 PM      Result Value Range   Prothrombin Time 13.8  11.6 - 15.2 seconds   INR 1.07  0.00 - 1.49  POCT I-STAT TROPONIN I     Status: None   Collection Time    05/14/12  2:23 PM      Result Value Range   Troponin i, poc 0.01  0.00 - 0.08 ng/mL   Comment 3            Comment: Due to the release kinetics of cTnI,     a negative result within the first hours     of the onset of symptoms does not rule out     myocardial infarction with certainty.     If myocardial infarction is still suspected,     repeat the test at appropriate intervals.   Ct Head Wo Contrast (only If Suspected Head  Trauma And/or Pt Is On Anticoagulant)  05/14/2012  *RADIOLOGY REPORT*  Clinical Data: Left-sided numbness, history of TIA/stroke  CT HEAD WITHOUT CONTRAST  Technique:  Contiguous axial images were obtained from the base of the skull through the vertex without contrast.  Comparison: MRI brain dated 02/11/2011  Findings: No evidence of parenchymal hemorrhage or extra-axial fluid collection. No mass lesion, mass effect, or midline shift.  No CT evidence of acute infarction.  Old left thalamic lacunar infarct.  Subcortical white matter and periventricular small vessel ischemic changes.  Intracranial atherosclerosis.  Mild global cortical atrophy.  No ventriculomegaly.  The visualized paranasal sinuses are essentially clear. The mastoid air cells are unopacified.  No evidence of calvarial fracture.  IMPRESSION: No evidence of acute intracranial abnormality.  Old left thalamic lacunar infarct.  Atrophy with small vessel ischemic changes and intracranial atherosclerosis.   Original Report Authenticated By: Julian Hy, M.D.     Assessment: 79 y.o. male prob. TIA, as well as confusion. Recommend stroke workup as described. EEG.   Stroke Risk Factors - hypertension  Plan: 1. HgbA1c, fasting lipid panel ( unless done within past month) 2. MRI, MRA  of the brain without contrast 3. PT consult, OT consult, Speech consult 4. Echocardiogram 5. Carotid dopplers 6. Prophylactic therapy-Antiplatelet med: Plavix -  dose 75mg  daily + Aspirin 325mg  daily 7. Risk factor modification 8. Telemetry monitoring 9. Frequent neuro checks  Joesphine Bare PA-C, MBA, MHA [facility] Pager [phone]  And personally participated in this patient's care, including formulation of the above clinical assessment and management recommendations.  Rush Farmer M.D. [facility] [phone] 05/14/2012, 3:49 PM

## 2012-05-14 NOTE — ED Notes (Signed)
Patient transported to CT 

## 2012-05-14 NOTE — H&P (Signed)
[facility] History and Physical  Aron Wolffe [id number] DOB: [birth year] DOA: 05/14/2012  Referring physician: Dr. Sabra Heck PCP: Alesia Richards, MD  Specialists: Neurohospitalist  Chief Complaint: Mental status change, and left arm numbness  HPI: Lance Ramirez is a 79 y.o. 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 Went and saw his primary care doctor who examined him and found no acute findings on physical exam. On the day of admission he started developing mild confusion and a mild facial droop noticed by his family. He also started complaining of hand numbness. So he was brought here into the emergency room. In the emergency room CT scan of the head was done that was negative for any bleed. Neurology was consulted they recommended admission for further evaluation and he is not a candidate for TPA.  Review of Systems: The patient denies anorexia, fever, weight loss, decreased hearing, hoarseness, chest pain, syncope, dyspnea on exertion, peripheral edema, balance deficits, hemoptysis, abdominal pain, melena, hematochezia, severe indigestion/heartburn, hematuria, incontinence, genital sores, muscle weakness, suspicious skin lesions, transient blindness, difficulty walking, depression, unusual weight change, abnormal bleeding, enlarged lymph nodes, angioedema, and breast masses.    Past Medical History  Diagnosis Date  . Hypertension   . Stroke   . TIA (transient ischemic attack)     last event approx 3wks ago  . GERD (gastroesophageal reflux disease)   . Arthritis   . Cerebral embolism with cerebral infarction 02/10/2011   Past Surgical History  Procedure Laterality Date  . Hernia repair    . Back surgery    . 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 His smoking use included Cigarettes. He has a 30 pack-year smoking history. He quit smokeless tobacco use about 34 years ago. His smokeless tobacco use included Chew. He reports that he does not drink alcohol or use illicit drugs. Lives at home with wife can perform most of his ADLs  No Known Allergies  Family History  Problem Relation Age of Onset  . Sudden death Mother   . Other Father     Prior to Admission medications   Medication Sig Start Date End Date Taking? Authorizing Provider  amphetamine-dextroamphetamine (ADDERALL) 20 MG tablet Take 20 mg by mouth 2 (two) times daily.   Yes Historical Provider, MD  aspirin EC 81 MG tablet Take 81 mg by mouth daily.   Yes Historical Provider, MD  atenolol (TENORMIN) 100 MG tablet Take 100 mg by mouth daily.     Yes Historical Provider, MD  BUPROPION HCL PO Take 0.5-1 tablets by mouth 2 (two) times daily. 1 tablet in the morning and 0.5 tablet with lunch   Yes Historical Provider, MD  Cholecalciferol (VITAMIN D PO) Take 5,000 Units by mouth daily.    Yes Historical Provider, MD  clopidogrel (PLAVIX) 75 MG tablet Take 75 mg by mouth daily.   Yes Historical Provider, MD  doxazosin (CARDURA) 4 MG tablet Take 4 mg by mouth at bedtime.   Yes Historical Provider, MD  gemfibrozil (LOPID) 600 MG tablet Take 600 mg by mouth 2 (two) times daily before a meal.   Yes Historical Provider, MD  hydrochlorothiazide (HYDRODIURIL) 25 MG tablet Take 25 mg by mouth daily.  Yes Historical Provider, MD  losartan (COZAAR) 100 MG tablet Take 100 mg by mouth at bedtime.    Yes Historical Provider, MD  minoxidil (LONITEN) 10 MG tablet Take 10 mg by mouth at bedtime.    Yes Historical Provider, MD  testosterone cypionate (DEPOTESTOTERONE CYPIONATE) 100 MG/ML injection Inject 200 mg into the muscle every 6 (six) weeks. For IM use only     Historical Provider, MD   Physical Exam: Filed Vitals:    05/14/12 1348 05/14/12 1523  BP: 144/73 134/52  Pulse: 81 72  Temp: 98.3 F (36.8 C) 98.8 F (37.1 C)  TempSrc: Oral Oral  Resp: 20 18  SpO2: 98% 98%    BP 134/52  Pulse 72  Temp(Src) 98.8 F (37.1 C) (Oral)  Resp 18  SpO2 98%  General Appearance:    Alert, cooperative, no distress, appears stated age  Head:    Normocephalic, without obvious abnormality, atraumatic  Eyes:    PERRL, conjunctiva/corneas clear, EOM's intact, fundi    benign, both eyes             Throat:   Lips, mucosa, and tongue normal; teeth and gums normal  Neck:   Supple, symmetrical, trachea midline, no adenopathy;       thyroid:  No enlargement/tenderness/nodules; no carotid   bruit or JVD  Back:     Symmetric, no curvature, ROM normal, no CVA tenderness  Lungs:     Clear to auscultation bilaterally, respirations unlabored  Chest wall:    No tenderness or deformity  Heart:    Regular rate and rhythm, S1 and S2 normal, no murmur, rub   or gallop  Abdomen:     Soft, non-tender, bowel sounds active all four quadrants,    no masses, no organomegaly        Extremities:   Extremities normal, atraumatic, no cyanosis or edema  Pulses:   2+ and symmetric all extremities  Skin:   Skin color, texture, turgor normal, no rashes or lesions  Lymph nodes:   Cervical, supraclavicular, and axillary nodes normal  Neurologic:   III,IV, VI: extra-ocular motions intact bilaterally,V,VII: smile symmetric, facial light touch asymmetrical no sensation on the left side of his face. VIII: hearing normal bilaterally, IX,X: gag reflex present  XI: trapezius strength/neck flexion strength normal bilaterally  XII: tongue strength normal, Right : Upper extremity 5/5 Left: Upper extremity 5/5 Lower extremity 5/5 Lower extremity 5/5, sensation on his left side upper extremity is decreased to light touch compared to the right. Fortunately sensation is intact to light touch. Tone is normal deep tendon reflexes 2+ bilaterally and plantar  reflexes are down going bilaterally and symmetrical. Finger to nose is normal.     Labs on Admission:  Basic Metabolic Panel:  Recent Labs Lab 05/14/12 1400  NA 142  K 3.7  CL 104  CO2 28  GLUCOSE 116*  BUN 16  CREATININE 1.19  CALCIUM 9.5   Liver Function Tests:  Recent Labs Lab 05/14/12 1400  AST 16  ALT 15  ALKPHOS 58  BILITOT 0.5  PROT 6.5  ALBUMIN 3.6   No results found for this basename: LIPASE, AMYLASE,  in the last 168 hours No results found for this basename: AMMONIA,  in the last 168 hours CBC:  Recent Labs Lab 05/14/12 1400  WBC 4.3  HGB 14.8  HCT 41.7  MCV 83.4  PLT 165   Cardiac Enzymes: No results found for this basename: CKTOTAL, CKMB, CKMBINDEX, TROPONINI,  in the  last 168 hours  BNP (last 3 results) No results found for this basename: PROBNP,  in the last 8760 hours CBG:  Recent Labs Lab 05/14/12 1351  GLUCAP 114*    Radiological Exams on Admission: Ct Head Wo Contrast (only If Suspected Head Trauma And/or Pt Is On Anticoagulant)  05/14/2012  *RADIOLOGY REPORT*  Clinical Data: Left-sided numbness, history of TIA/stroke  CT HEAD WITHOUT CONTRAST  Technique:  Contiguous axial images were obtained from the base of the skull through the vertex without contrast.  Comparison: MRI brain dated 02/11/2011  Findings: No evidence of parenchymal hemorrhage or extra-axial fluid collection. No mass lesion, mass effect, or midline shift.  No CT evidence of acute infarction.  Old left thalamic lacunar infarct.  Subcortical white matter and periventricular small vessel ischemic changes.  Intracranial atherosclerosis.  Mild global cortical atrophy.  No ventriculomegaly.  The visualized paranasal sinuses are essentially clear. The mastoid air cells are unopacified.  No evidence of calvarial fracture.  IMPRESSION: No evidence of acute intracranial abnormality.  Old left thalamic lacunar infarct.  Atrophy with small vessel ischemic changes and intracranial  atherosclerosis.   Original Report Authenticated By: Julian Hy, M.D.     EKG: Independently reviewed. Sinus rhythm no T wave inversions extrasystole normal axis  Assessment/Plan  CVA (cerebral infarction): - HgbA1c, fasting lipid panel  - MRI, MRA of the brain without contrast  - PT consult, OT consult, Speech consult  - Echocardiogram  - Carotid dopplers  - Prophylactic therapy-we'll have to discuss with neurology probably a good candidate for Aggrenox as he has been - previously on aspirin and Plavix. - risk factor modification  - Cardiac Monitoring  - Neurochecks q4h  - Keep MAP 70   Hypertension: - Rule out some degree of permissiv hypertension. I will go ahead and hold his minoxidil. Continues his hydrochlorothiazide and will start him to    Hyperlipidemia: - Continue gemfibrozil, start him on statin low-dose. A fasting lipid panel.  Neuro hospitalist  Code Status: Full code Family Communication: Granddaughter and wife Disposition Plan: inpatient  Time spent: 72 minutes  Charlynne Cousins [facility] Pager [phone]  If 7PM-7AM, please contact night-coverage [email or web address] Password [facility] 05/14/2012, 4:38 PM

## 2012-05-14 NOTE — ED Provider Notes (Signed)
History     CSN: [id number]  Arrival date & time 05/14/12  1346   First MD Initiated Contact with Patient 05/14/12 1441      Chief Complaint  Patient presents with  . Numbness    (Consider location/radiation/quality/duration/timing/severity/associated sxs/prior treatment) HPI Comments: 79 year old male with a history of stroke, frequent TIAs, hypertension who presents with a complaint of new onset left upper extremity numbness, intermittent stool changes, some difficulty ambulating and some memory problems. He seemed to wax and wane with his symptoms, but becomes very emotional during the exam. The symptoms are mild to moderate, they have almost completely resolved at this time and are not associated with fevers chills nausea vomiting chest pain cough or shortness of breath. The patient takes aspirin and Plavix. Several years ago he had a stroke that required thrombolytic therapy with good resolution of the left-sided weakness at that time  The history is provided by the patient and a relative.    Past Medical History  Diagnosis Date  . Hypertension   . Stroke   . TIA (transient ischemic attack)     last event approx 3wks ago  . GERD (gastroesophageal reflux disease)   . Arthritis   . Cerebral embolism with cerebral infarction 02/10/2011    Past Surgical History  Procedure Laterality Date  . Hernia repair    . Back surgery    . 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  History  Substance Use Topics  . Smoking status: Former Smoker    Types: Cigarettes    Quit date: 03/12/1978  . Smokeless tobacco: Former Systems developer    Types: Chew    Quit date: 03/12/1978  . Alcohol Use: No      Review of Systems  All other systems reviewed and are negative.    Allergies  Review of patient's allergies indicates no known allergies.  Home Medications   Current Outpatient Rx  Name  Route  Sig  Dispense  Refill  .  amphetamine-dextroamphetamine (ADDERALL) 20 MG tablet   Oral   Take 20 mg by mouth 2 (two) times daily.         Marland Kitchen aspirin EC 81 MG tablet   Oral   Take 81 mg by mouth daily.         Marland Kitchen atenolol (TENORMIN) 100 MG tablet   Oral   Take 100 mg by mouth daily.           . BUPROPION HCL PO   Oral   Take 0.5-1 tablets by mouth 2 (two) times daily. 1 tablet in the morning and 0.5 tablet with lunch         . Cholecalciferol (VITAMIN D PO)   Oral   Take 5,000 Units by mouth daily.          . clopidogrel (PLAVIX) 75 MG tablet   Oral   Take 75 mg by mouth daily.         Marland Kitchen doxazosin (CARDURA) 4 MG tablet   Oral   Take 4 mg by mouth at bedtime.         Marland Kitchen gemfibrozil (LOPID) 600 MG tablet   Oral   Take 600 mg by mouth 2 (two) times daily before a meal.         . hydrochlorothiazide (HYDRODIURIL) 25 MG tablet   Oral   Take 25 mg by mouth daily.         Marland Kitchen  losartan (COZAAR) 100 MG tablet   Oral   Take 100 mg by mouth at bedtime.          . minoxidil (LONITEN) 10 MG tablet   Oral   Take 10 mg by mouth at bedtime.          Marland Kitchen testosterone cypionate (DEPOTESTOTERONE CYPIONATE) 100 MG/ML injection   Intramuscular   Inject 200 mg into the muscle every 6 (six) weeks. For IM use only            BP 134/52  Pulse 72  Temp(Src) 98.8 F (37.1 C) (Oral)  Resp 18  SpO2 98%  Physical Exam  Nursing note and vitals reviewed. Constitutional: He appears well-developed and well-nourished. No distress.  HENT:  Head: Normocephalic and atraumatic.  Mouth/Throat: Oropharynx is clear and moist. No oropharyngeal exudate.  Eyes: Conjunctivae and EOM are normal. Pupils are equal, round, and reactive to light. Right eye exhibits no discharge. Left eye exhibits no discharge. No scleral icterus.  Neck: Normal range of motion. Neck supple. No JVD present. No thyromegaly present.  Cardiovascular: Normal rate, regular rhythm, normal heart sounds and intact distal pulses.  Exam  reveals no gallop and no friction rub.   No murmur heard. No carotid bruit  Pulmonary/Chest: Effort normal and breath sounds normal. No respiratory distress. He has no wheezes. He has no rales.  Abdominal: Soft. Bowel sounds are normal. He exhibits no distension and no mass. There is no tenderness.  Musculoskeletal: Normal range of motion. He exhibits no edema and no tenderness.  Lymphadenopathy:    He has no cervical adenopathy.  Neurological: He is alert. Coordination normal.  Cranial nerves III through XII are intact, speech is clear, memory is clear, moves all extremities x4 with normal strength in all 4 extremities, normal reflexes at the patellar tendons bilaterally, decreased sensation to light touch and pinprick to the left upper extremity.  Skin: Skin is warm and dry. No rash noted. No erythema.  Psychiatric: He has a normal mood and affect. His behavior is normal.    ED Course  Procedures (including critical care time)  Labs Reviewed  GLUCOSE, CAPILLARY - Abnormal; Notable for the following:    Glucose-Capillary 114 (*)    All other components within normal limits  COMPREHENSIVE METABOLIC PANEL - Abnormal; Notable for the following:    Glucose, Bld 116 (*)    GFR calc non Af Amer 58 (*)    GFR calc Af Amer 67 (*)    All other components within normal limits  CBC  PROTIME-INR  URINALYSIS, ROUTINE W REFLEX MICROSCOPIC  POCT I-STAT TROPONIN I   Ct Head Wo Contrast (only If Suspected Head Trauma And/or Pt Is On Anticoagulant)  05/14/2012  *RADIOLOGY REPORT*  Clinical Data: Left-sided numbness, history of TIA/stroke  CT HEAD WITHOUT CONTRAST  Technique:  Contiguous axial images were obtained from the base of the skull through the vertex without contrast.  Comparison: MRI brain dated 02/11/2011  Findings: No evidence of parenchymal hemorrhage or extra-axial fluid collection. No mass lesion, mass effect, or midline shift.  No CT evidence of acute infarction.  Old left thalamic  lacunar infarct.  Subcortical white matter and periventricular small vessel ischemic changes.  Intracranial atherosclerosis.  Mild global cortical atrophy.  No ventriculomegaly.  The visualized paranasal sinuses are essentially clear. The mastoid air cells are unopacified.  No evidence of calvarial fracture.  IMPRESSION: No evidence of acute intracranial abnormality.  Old left thalamic lacunar infarct.  Atrophy with  small vessel ischemic changes and intracranial atherosclerosis.   Original Report Authenticated By: Julian Hy, M.D.      1. TIA (transient ischemic attack)       MDM  Overall the patient appears well at this time though he has been having waxing and waning symptoms are concerning for TIAs which appear more frequently. I have discussed his care with the neurologist Dr. Nicole Kindred who will see the patient in consultation and requests admission to medical service. EKG did not show any arrhythmias, no atrial fibrillation and no ischemia  CT scan shows old infarct but nothing new, normal troponin, normal CBC, coagulation studies and a compressive metabolic panel which is unremarkable. The patient will be admitted to medical service   ED ECG REPORT  I personally interpreted this EKG   Date: 05/14/2012   Rate: 73  Rhythm: normal sinus rhythm  QRS Axis: normal  Intervals: normal  ST/T Wave abnormalities: normal  Conduction Disutrbances:none  Narrative Interpretation:   Old EKG Reviewed: Compared with 02/10/2011, no significant changes  Discussed with neurology, Dr. Nicole Kindred agrees with admission to the hospital.  Discussed with Dr. Venetia Constable who will admit.       Johnna Acosta, MD 05/14/12 1556

## 2012-05-14 NOTE — ED Notes (Signed)
MD at bedside. Neurology

## 2012-05-14 NOTE — ED Notes (Signed)
PT with hx of tia's and stroke last Thanksgiving to ED c/o L sided numbness at 1320.  Pt AO per norm.  No neuro deficits noted.  Pt c/o L arm numbness at this time.  Pt on plavix and asa.  Dr Sabra Heck notified and stated not to call code stroke.  CBG 114.

## 2012-05-15 ENCOUNTER — Inpatient Hospital Stay (HOSPITAL_COMMUNITY): Payer: Medicare Other

## 2012-05-15 LAB — LIPID PANEL
Cholesterol: 191 mg/dL (ref 0–200)
LDL Cholesterol: 126 mg/dL — ABNORMAL HIGH (ref 0–99)
Triglycerides: 75 mg/dL (ref <150)
VLDL: 15 mg/dL (ref 0–40)

## 2012-05-15 LAB — GLUCOSE, CAPILLARY

## 2012-05-15 MED ORDER — ASPIRIN EC 81 MG PO TBEC
81.0000 mg | DELAYED_RELEASE_TABLET | Freq: Every day | ORAL | Status: DC
  Administered 2012-05-16 – 2012-05-17 (×2): 81 mg via ORAL
  Filled 2012-05-15 (×2): qty 1

## 2012-05-15 MED ORDER — FENOFIBRATE 160 MG PO TABS
160.0000 mg | ORAL_TABLET | Freq: Every day | ORAL | Status: DC
  Administered 2012-05-15 – 2012-05-17 (×3): 160 mg via ORAL
  Filled 2012-05-15 (×3): qty 1

## 2012-05-15 NOTE — Progress Notes (Signed)
Physical Therapy Evaluation Patient Details Name: Lance Ramirez MRN: [id number] DOB: Jun 15, 1933 Today's Date: 05/15/2012 Time: [id number] PT Time Calculation (min): 35 min  PT Assessment / Plan / Recommendation Clinical Impression  Pt is a pleasent 79 y.o. male s/p possible CVA with hx of CVA.  Patient demonstrates deficits in cognition and safety at this time. Patient may benefit from continued PT to ensure safety and maximize mobility for discharge home.  Rec continued PT acutely to perform stair negotiation and recommend 24/7 supervision when discharged secondary to cognitive deficits.     PT Assessment  Patient needs continued PT services    Follow Up Recommendations  Supervision/Assistance - 24 hour    Does the patient have the potential to tolerate intense rehabilitation      Barriers to Discharge None      Equipment Recommendations  None recommended by PT       Frequency Min 2X/week    Precautions / Restrictions Restrictions Weight Bearing Restrictions: No   Pertinent Vitals/Pain No pain      Mobility  Bed Mobility Bed Mobility: Supine to Sit;Sitting - Scoot to Edge of Bed Supine to Sit: 5: Supervision Sitting - Scoot to [employer] of Bed: 5: Supervision Transfers Transfers: Sit to Stand;Stand to Sit Sit to Stand: 5: Supervision Stand to Sit: 5: Supervision Details for Transfer Assistance: Very impulsive Ambulation/Gait Ambulation/Gait Assistance: 5: Supervision;4: Min guard Ambulation Distance (Feet): 300 Feet Assistive device: None Ambulation/Gait Assistance Details: Guard for turns secondary to increased baseline speed of gait Gait Pattern: Within Functional Limits Gait velocity: increased General Gait Details: patient ambulated quickly at baseline; narrow base of support Stairs: No Modified Rankin (Stroke Patients Only) Pre-Morbid Rankin Score: Slight disability Modified Rankin: Slight disability    Exercises     PT Diagnosis: Altered mental status  PT  Problem List: Decreased mobility;Decreased cognition PT Treatment Interventions: Gait training;Stair training;Functional mobility training;Therapeutic activities;Therapeutic exercise   PT Goals Acute Rehab PT Goals PT Goal Formulation: With patient/family Time For Goal Achievement: 05/22/12 Potential to Achieve Goals: Good Pt will Ambulate: >150 feet;with supervision PT Goal: Ambulate - Progress: Goal set today Pt will Go Up / Down Stairs: 3-5 stairs;with supervision PT Goal: Up/Down Stairs - Progress: Goal set today  Visit Information  Last PT Received On: 05/15/12 Assistance Needed: +1    Subjective Data  [facility] 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   Prior [place] Lives With: Spouse Available Help at Discharge: Family Type of Home: House Home Access: Stairs to enter Technical brewer of Steps: 3 Entrance Stairs-Rails: Right Home Layout: One level Bathroom Shower/Tub: Multimedia programmer: [place]: Environmental consultant - rolling Prior Function Level of Independence: Independent Able to Take Stairs?: Yes Driving: Yes Vocation: Retired Corporate investment banker: No difficulties    Solicitor Overall Cognitive Status: Impaired Area of Impairment: Memory;Safety/judgement;Awareness of errors;Problem solving Arousal/Alertness: Awake/alert Orientation Level: Other (comment) (knew in hospital but said he was at [facility]; knew yr not day) Behavior During Session: [facility] for tasks performed Memory Deficits: unable to recall home setup; who lives with him, events (wife there to confirm)  Safety/Judgement: Impulsive Awareness of Errors: Assistance required to identify errors made;Assistance required to correct errors made    Extremity/Trunk Assessment Right Upper Extremity Assessment RUE ROM/Strength/Tone: [facility] for tasks assessed Left Upper Extremity Assessment LUE ROM/Strength/Tone:  [facility] for tasks assessed Right Lower Extremity Assessment RLE ROM/Strength/Tone: [facility] for tasks assessed RLE Sensation: [facility] -  Light Touch;WFL - Proprioception Left Lower Extremity Assessment LLE ROM/Strength/Tone: WFL for tasks assessed LLE Sensation: WFL - Light Touch;WFL - Proprioception   Balance Balance Balance Assessed: Yes Static Standing Balance Static Standing - Balance Support: No upper extremity supported Static Standing - Level of Assistance: 7: Independent Static Standing - Comment/# of Minutes: 4  End of Session PT - End of Session Equipment Utilized During Treatment: Gait belt Activity Tolerance: Patient tolerated treatment well Patient left: in chair;with call bell/phone within reach;with family/visitor present Nurse Communication: Mobility status  GP     Duncan Dull 05/15/2012, 3:07 PM Alben Deeds, [place] DPT  [phone]

## 2012-05-15 NOTE — Evaluation (Signed)
Occupational Therapy Evaluation Patient Details Name: Lance Ramirez MRN: [id number] DOB: [birth year] Today's Date: 05/15/2012 Time: [id number] OT Time Calculation (min): 34 min  OT Assessment / Plan / Recommendation Clinical Impression  Pt admitted with confusion. Stroke work up under way.  Pt will benefit from acute OT services to address below problem list. Recommending 24/7 supervision at home from family.    OT Assessment  Patient needs continued OT Services    Follow Up Recommendations  No OT follow up;Supervision/Assistance - 24 hour    Barriers to Discharge None    Equipment Recommendations  None recommended by OT    Recommendations for Other Services Speech consult (speech for cognition)  Frequency  Min 3X/week    Precautions / Restrictions Restrictions Weight Bearing Restrictions: No   Pertinent Vitals/Pain See vitals    ADL  Eating/Feeding: Performed;Independent Where Assessed - Eating/Feeding: Edge of bed Grooming: Performed;Wash/dry hands;Supervision/safety Where Assessed - Grooming: Unsupported standing Toilet Transfer: Chartered loss adjuster Method: Sit to Loss adjuster, chartered: Comfort height toilet (stood to urinate at toilet) [place] and Hygiene: Performed;Independent Where Assessed - Toileting Clothing Manipulation and Hygiene: Standing Equipment Used: Gait belt Transfers/Ambulation Related to ADLs: Pt is supervision within room.  When ambulating in hallway, pt required close min guard due to increased gait speed and making sharp turns.  Wife reports he walks briskly at baseline. ADL Comments: Educated pt and wife on need for 24/7 supervision intially due to cognitive deficits. Also recommended that pt wait until cleared by MD to resume driving. Pt and wife verbalized understanding.    OT Diagnosis:    OT Problem List: Decreased cognition;Decreased safety awareness OT Treatment Interventions:  Self-care/ADL training;Cognitive remediation/compensation;Patient/family education   OT Goals Acute Rehab OT Goals OT Goal Formulation: With patient/family Time For Goal Achievement: 05/22/12 Potential to Achieve Goals: Good Miscellaneous OT Goals Miscellaneous OT Goal #1: Pt will locate 5/5 objects in unit/hallway independently using mapping strategies and environmental cues OT Goal: Miscellaneous Goal #1 - Progress: Goal set today Miscellaneous OT Goal #2: Pt will perform functional mobility at mod I level. 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   Prior [place] Lives With: Spouse Available Help at Discharge: Family Type of Home: House Home Access: Stairs to enter Technical brewer of Steps: 3 Entrance Stairs-Rails: Right Home Layout: One level Bathroom Shower/Tub: Multimedia programmer: [place]: Environmental consultant - rolling Prior Function Level of Independence: Independent Able to Take Stairs?: Yes Driving: Yes Vocation: Retired Corporate investment banker: No difficulties Dominant Hand: Right         Vision/Perception Vision - History Baseline Vision: Wears glasses only for reading Patient Visual Report: No change from baseline   Cognition  Cognition Overall Cognitive Status: Impaired Area of Impairment: Memory;Safety/judgement;Awareness of errors;Problem solving Arousal/Alertness: Awake/alert Orientation Level: Other (comment) (knew in hospital but said he was at [facility]; knew yr not day) Behavior During Session: [facility] for tasks performed Memory Deficits: unable to recall home setup; who lives with him, events (wife there to confirm)  Safety/Judgement:  Impulsive Awareness of Errors: Assistance required to identify errors made;Assistance required to correct errors made Cognition - Other Comments: Required min-mod verbal cues during cognitive activities/mapping tasks in hall to  locate rooms in unit,    Extremity/Trunk Assessment Right Upper Extremity Assessment RUE ROM/Strength/Tone: Within functional levels RUE Sensation: WFL - Light Touch;WFL - Proprioception RUE Coordination: WFL - gross/fine motor Left Upper Extremity Assessment LUE ROM/Strength/Tone: WFL for tasks assessed (4/5 throughout) LUE Sensation: Deficits LUE Sensation Deficits: numbness on dorsum of hand LUE Coordination: WFL - gross/fine motor Right Lower Extremity Assessment RLE ROM/Strength/Tone: WFL for tasks assessed RLE Sensation: WFL - Light Touch;WFL - Proprioception Left Lower Extremity Assessment LLE ROM/Strength/Tone: WFL for tasks assessed LLE Sensation: WFL - Light Touch;WFL - Proprioception     Mobility Bed Mobility Bed Mobility: Supine to Sit;Sitting - Scoot to Edge of Bed Supine to Sit: 5: Supervision Sitting - Scoot to Edge of Bed: 5: Supervision Transfers Transfers: Sit to Stand;Stand to Sit Sit to Stand: 5: Supervision Stand to Sit: 5: Supervision Details for Transfer Assistance: Very impulsive     Exercise     Balance Balance Balance Assessed: Yes Static Standing Balance Static Standing - Balance Support: No upper extremity supported Static Standing - Level of Assistance: 7: Independent Static Standing - Comment/# of Minutes: 4   End of Session OT - End of Session Equipment Utilized During Treatment: Gait belt Activity Tolerance: Patient tolerated treatment well Patient left: in chair;with call bell/phone within reach;with family/visitor present Nurse Communication: Mobility status  GO   05/15/2012 Darrol Jump OTR/L Pager [phone] Office [phone]   Darrol Jump 05/15/2012, 3:37 PM

## 2012-05-15 NOTE — Progress Notes (Signed)
[facility] Acute Rehabilitation [phone] (pager)

## 2012-05-15 NOTE — Progress Notes (Signed)
[facility] PROGRESS NOTE  Lance Ramirez [id number] DOB: February 22, 1934 DOA: 05/14/2012 PCP: Alesia Richards, MD  Assessment/Plan:  CVA (cerebral infarction):  - HgbA1c, fasting lipid panel  - MRI, MRA of the brain without contrast  Showed PCA artery infarct.  - PT consult, OT consult, Speech consult  - Echocardiogram pending - Carotid dopplers pending.  - Prophylactic therapy-we'll have to discuss with neurology probably a good candidate for Aggrenox as he has been - previously on aspirin and Plavix.  - risk factor modification  - Cardiac Monitoring  - Neurochecks q4h  - Keep MAP 70  Hypertension:  - Rule out some degree of permissiv hypertension. I will go ahead and hold his minoxidil. Continues his hydrochlorothiazide and will start him to  Hyperlipidemia:  - Continue gemfibrozil, start him on statin low-dose. A fasting lipid panel.  Neuro hospitalist  Code Status: Full code  Family Communication: Granddaughter and wife  Disposition Plan: inpatient   Consultants:  neuro  HPI/Subjective: Comfortable lying in bed no new complaints .   Objective: Filed Vitals:   05/15/12 0036 05/15/12 0243 05/15/12 0602 05/15/12 1028  BP: 121/57 136/71 116/74 135/69  Pulse: 68 68 70 72  Temp: 97.9 F (36.6 C) 98.6 F (37 C) 98.3 F (36.8 C) 98.2 F (36.8 C)  TempSrc: Oral Oral Oral Oral  Resp: 20 20 16 18   Height:      Weight:      SpO2: 98% 98% 97% 98%    Intake/Output Summary (Last 24 hours) at 05/15/12 1801 Last data filed at 05/15/12 1300  Gross per 24 hour  Intake      0 ml  Output      1 ml  Net     -1 ml   Filed Weights   05/14/12 2056  Weight: 83.462 kg (184 lb)    Lungs: Clear to auscultation bilaterally, respirations unlabored Heart: Regular rate and rhythm, S1 and S2 normal, no murmur, rub or gallop  Abdomen: Soft, non-tender, bowel sounds active all four quadrants,  no masses, no organomegaly  Extremities: Extremities normal, atraumatic, no  cyanosis or edema    Data Reviewed: Basic Metabolic Panel:  Recent Labs Lab 05/14/12 1400 05/14/12 2051  NA 142  --   K 3.7  --   CL 104  --   CO2 28  --   GLUCOSE 116*  --   BUN 16  --   CREATININE 1.19 1.23  CALCIUM 9.5  --    Liver Function Tests:  Recent Labs Lab 05/14/12 1400  AST 16  ALT 15  ALKPHOS 58  BILITOT 0.5  PROT 6.5  ALBUMIN 3.6   No results found for this basename: LIPASE, AMYLASE,  in the last 168 hours No results found for this basename: AMMONIA,  in the last 168 hours CBC:  Recent Labs Lab 05/14/12 1400 05/14/12 2051  WBC 4.3 4.9  HGB 14.8 14.1  HCT 41.7 40.3  MCV 83.4 83.8  PLT 165 155   Cardiac Enzymes: No results found for this basename: CKTOTAL, CKMB, CKMBINDEX, TROPONINI,  in the last 168 hours BNP (last 3 results) No results found for this basename: PROBNP,  in the last 8760 hours CBG:  Recent Labs Lab 05/14/12 1351 05/14/12 2212 05/15/12 0644  GLUCAP 114* 131* 102*    No results found for this or any previous visit (from the past 240 hour(s)).   Studies: Dg Chest 2 View  05/15/2012  *RADIOLOGY REPORT*  Clinical Data: Stroke  CHEST -  2 VIEW  Comparison: Prior study 02/10/2011  Findings: The lungs are clear.  Unchanged cardiomegaly with left ventricular prominence.  The thoracic aorta is tortuous, ectatic and atherosclerotic.  Coarse calcifications are noted on the mitral valve annulus.  Rounded density in the right paratracheal region likely reflects an underlying vascular structure.  This is similar to prior.  Incompletely imaged anterior cervical fusion hardware. Degenerative changes of the right acromioclavicular joint with calcification of the coracoclavicular ligament suggesting prior AC joint injury.  No acute osseous abnormality.  IMPRESSION: No acute cardiopulmonary disease.   Original Report Authenticated By: Jacqulynn Cadet, M.D.    Ct Head Wo Contrast (only If Suspected Head Trauma And/or Pt Is On  Anticoagulant)  05/14/2012  *RADIOLOGY REPORT*  Clinical Data: Left-sided numbness, history of TIA/stroke  CT HEAD WITHOUT CONTRAST  Technique:  Contiguous axial images were obtained from the base of the skull through the vertex without contrast.  Comparison: MRI brain dated 02/11/2011  Findings: No evidence of parenchymal hemorrhage or extra-axial fluid collection. No mass lesion, mass effect, or midline shift.  No CT evidence of acute infarction.  Old left thalamic lacunar infarct.  Subcortical white matter and periventricular small vessel ischemic changes.  Intracranial atherosclerosis.  Mild global cortical atrophy.  No ventriculomegaly.  The visualized paranasal sinuses are essentially clear. The mastoid air cells are unopacified.  No evidence of calvarial fracture.  IMPRESSION: No evidence of acute intracranial abnormality.  Old left thalamic lacunar infarct.  Atrophy with small vessel ischemic changes and intracranial atherosclerosis.   Original Report Authenticated By: Julian Hy, M.D.    Mr Brain Wo Contrast  05/15/2012  *RADIOLOGY REPORT*  Clinical Data:  Mental status change.  Left arm numbness  MRI HEAD WITHOUT CONTRAST MRA HEAD WITHOUT CONTRAST  Technique:  Multiplanar, multiecho pulse sequences of the brain and surrounding structures were obtained without intravenous contrast. Angiographic images of the head were obtained using MRA technique without contrast.  Comparison:  CT 05/14/2012  MRI HEAD  Findings:  Acute infarct in the right posterior cerebral artery territory.  Acute infarct in the right hippocampus, right occipital white matter, and a small area of acute infarct in the right thalamus.  Generalized atrophy.  Chronic microvascular ischemic changes in the cerebral white matter.  Chronic lacunar infarction left thalamus. Brainstem and cerebellum are intact.  Negative for hemorrhage or mass.  Ventricle size is not enlarged and there is no midline shift.  IMPRESSION: Acute infarct right  PCA territory  MRA HEAD  Findings: Both vertebral arteries are patent to the basilar.  The basilar is patent.  Superior cerebellar arteries are patent bilaterally.  There is occlusion of the right posterior cerebral artery at the origin.  There is a high-grade stenosis of the proximal left PCA which then occludes.  This is unchanged from 02/11/2011.  Atherosclerotic irregularity in the cavernous carotid bilaterally without significant stenosis.  Atherosclerotic disease is present in the M1 segment bilaterally without flow limiting stenosis. Middle cerebral artery branches are patent bilaterally.  Both anterior cerebral arteries are patent.  Negative for aneurysm.  IMPRESSION: Occlusion of the right posterior cerebral artery at the origin compatible with acute infarct.  High-grade stenosis followed by occlusion of the left posterior cerebral artery, unchanged from 2012.  Mild atherosclerotic disease in the cavernous carotid and M1 segments bilaterally.   Original Report Authenticated By: Carl Best, M.D.    Mr Mra Head/brain Wo Cm  05/15/2012  *RADIOLOGY REPORT*  Clinical Data:  Mental status change.  Left  arm numbness  MRI HEAD WITHOUT CONTRAST MRA HEAD WITHOUT CONTRAST  Technique:  Multiplanar, multiecho pulse sequences of the brain and surrounding structures were obtained without intravenous contrast. Angiographic images of the head were obtained using MRA technique without contrast.  Comparison:  CT 05/14/2012  MRI HEAD  Findings:  Acute infarct in the right posterior cerebral artery territory.  Acute infarct in the right hippocampus, right occipital white matter, and a small area of acute infarct in the right thalamus.  Generalized atrophy.  Chronic microvascular ischemic changes in the cerebral white matter.  Chronic lacunar infarction left thalamus. Brainstem and cerebellum are intact.  Negative for hemorrhage or mass.  Ventricle size is not enlarged and there is no midline shift.  IMPRESSION: Acute infarct  right PCA territory  MRA HEAD  Findings: Both vertebral arteries are patent to the basilar.  The basilar is patent.  Superior cerebellar arteries are patent bilaterally.  There is occlusion of the right posterior cerebral artery at the origin.  There is a high-grade stenosis of the proximal left PCA which then occludes.  This is unchanged from 02/11/2011.  Atherosclerotic irregularity in the cavernous carotid bilaterally without significant stenosis.  Atherosclerotic disease is present in the M1 segment bilaterally without flow limiting stenosis. Middle cerebral artery branches are patent bilaterally.  Both anterior cerebral arteries are patent.  Negative for aneurysm.  IMPRESSION: Occlusion of the right posterior cerebral artery at the origin compatible with acute infarct.  High-grade stenosis followed by occlusion of the left posterior cerebral artery, unchanged from 2012.  Mild atherosclerotic disease in the cavernous carotid and M1 segments bilaterally.   Original Report Authenticated By: Carl Best, M.D.     Scheduled Meds: . amphetamine-dextroamphetamine  20 mg Oral BID  . [START ON 05/16/2012] aspirin EC  81 mg Oral Daily  . atenolol  100 mg Oral Daily  . atorvastatin  10 mg Oral q1800  . buPROPion  37.5 mg Oral Q1200  . buPROPion  75 mg Oral QAC breakfast  . clopidogrel  75 mg Oral Daily  . gemfibrozil  600 mg Oral BID AC  . heparin  5,000 Units Subcutaneous Q8H  . losartan  100 mg Oral QHS  . sodium chloride  3 mL Intravenous Q12H  . testosterone cypionate  200 mg Intramuscular Q6 weeks   Continuous Infusions:   Principal Problem:   CVA (cerebral infarction) Active Problems:   Hypertension   Hyperlipidemia       Maylin [place]burg  [facility] Pager [phone]. If 7PM-7AM, please contact night-coverage at [email or web address], password [facility] 05/15/2012, 6:01 PM  LOS: 1 day

## 2012-05-15 NOTE — Progress Notes (Signed)
Stroke Team Progress Note  HISTORY Lance Ramirez is a 79 y.o. male who LKW Tuesday 05/11/2012. That evening he started having visual changes bilaterally that lasted about a half hour. He then went to bed. The next day he remained sleepy all day. He did go to his doctor and was told that his exam was good. Later that evening he showed signs of confusion. At baseline, he does have mild dementia according to the family. On 05/12/04 he was upset that his family did not tell him that a dear friend had died 2 weeks earlier; however the family had told him. Again on 05/14/12, he did not remember this as Dr. Nicole Kindred discussed this in the room. He knew his age, but not the month. History of previous stroke with question of mild chronic left sided weakness per family. Most of history provided by family. Patient very tearful. Family denies any seizure type events.   Date last known well: 05/11/2012  Time last known well: 2100  tPA Given: No: out of window  SUBJECTIVE The patient's granddaughter is in the room this morning. She reports that other family members are on the way. The patient remains confused but is without complaints.  OBJECTIVE Most recent Vital Signs: Filed Vitals:   05/14/12 2236 05/15/12 0036 05/15/12 0243 05/15/12 0602  BP: 128/68 121/57 136/71 116/74  Pulse: 75 68 68 70  Temp: 98.3 F (36.8 C) 97.9 F (36.6 C) 98.6 F (37 C) 98.3 F (36.8 C)  TempSrc: Oral Oral Oral Oral  Resp: 20 20 20 16   Height:      Weight:      SpO2: 99% 98% 98% 97%   CBG (last 3)   Recent Labs  05/14/12 1351 05/14/12 2212 05/15/12 0644  GLUCAP 114* 131* 102*    IV Fluid Intake:     MEDICATIONS  . amphetamine-dextroamphetamine  20 mg Oral BID  . aspirin  300 mg Rectal Daily   Or  . aspirin  325 mg Oral Daily  . atenolol  100 mg Oral Daily  . atorvastatin  10 mg Oral q1800  . buPROPion  37.5 mg Oral Q1200  . buPROPion  75 mg Oral QAC breakfast  . clopidogrel  75 mg Oral Daily  . gemfibrozil   600 mg Oral BID AC  . heparin  5,000 Units Subcutaneous Q8H  . losartan  100 mg Oral QHS  . sodium chloride  3 mL Intravenous Q12H  . testosterone cypionate  200 mg Intramuscular Q6 weeks   PRN:  sodium chloride, ondansetron (ZOFRAN) IV, senna-docusate, sodium chloride  Diet:  Carb Control with thin liquids. Activity:  Bedrest with bathroom privileges DVT Prophylaxis:  Subcutaneous heparin  CLINICALLY SIGNIFICANT STUDIES Basic Metabolic Panel:  Recent Labs Lab 05/14/12 1400 05/14/12 2051  NA 142  --   K 3.7  --   CL 104  --   CO2 28  --   GLUCOSE 116*  --   BUN 16  --   CREATININE 1.19 1.23  CALCIUM 9.5  --    Liver Function Tests:  Recent Labs Lab 05/14/12 1400  AST 16  ALT 15  ALKPHOS 58  BILITOT 0.5  PROT 6.5  ALBUMIN 3.6   CBC:  Recent Labs Lab 05/14/12 1400 05/14/12 2051  WBC 4.3 4.9  HGB 14.8 14.1  HCT 41.7 40.3  MCV 83.4 83.8  PLT 165 155   Coagulation:  Recent Labs Lab 05/14/12 1400  LABPROT 13.8  INR 1.07   Cardiac Enzymes:  No results found for this basename: CKTOTAL, CKMB, CKMBINDEX, TROPONINI,  in the last 168 hours Urinalysis: No results found for this basename: COLORURINE, APPERANCEUR, LABSPEC, PHURINE, GLUCOSEU, HGBUR, BILIRUBINUR, KETONESUR, PROTEINUR, UROBILINOGEN, NITRITE, LEUKOCYTESUR,  in the last 168 hours Lipid Panel    Component Value Date/Time   CHOL 191 05/15/2012 0555   TRIG 75 05/15/2012 0555   HDL 50 05/15/2012 0555   CHOLHDL 3.8 05/15/2012 0555   VLDL 15 05/15/2012 0555   LDLCALC 126* 05/15/2012 0555   HgbA1C  Lab Results  Component Value Date   HGBA1C 6.0* 02/11/2011    Urine Drug Screen:   No results found for this basename: labopia, cocainscrnur, labbenz, amphetmu, thcu, labbarb    Alcohol Level: No results found for this basename: ETH,  in the last 168 hours  Dg Chest 2 View 05/15/12 IMPRESSION: No acute cardiopulmonary disease.    Ct Head Wo Contrast (only If Suspected Head Trauma And/or Pt Is On  Anticoagulant) 05/14/12 IMPRESSION: No evidence of acute intracranial abnormality.  Old left thalamic lacunar infarct.  Atrophy with small vessel ischemic changes and intracranial atherosclerosis.    MRI of the brain  Acute infarct right PCA territory.   MRA of the brain  Occlusion of the right posterior cerebral artery at the origin  compatible with acute infarct. High-grade stenosis followed by occlusion of the left posterior  cerebral artery, unchanged from 2012.  2D Echocardiogram  pending  Carotid Doppler  pending  EKG  sinus rhythm rate 73 beats per minute.  Therapy Recommendations pending  Physical Exam   General - this is a pleasant 79 year old male in bed in no acute distress. Heart - Regular rate and rhythm - no murmer Lungs - Clear to auscultation Abdomen - Soft - non tender Extremities - Distal pulses intact - no edema Skin - Warm and dry  NEUROLOGIC:   MENTAL STATUS: awake, alert, Language fluent Follows simple commands. The patient knows he is in the hospital with a stroke. He is not sure which hospital and does not know the date. CRANIAL NERVES: pupils equal and reactive to light,extraocular muscles intact, facial sensation and strength symmetric, uvula midlinec, tongue midline MOTOR: normal bulk and tone - motor strength 5 over 5 throughout. SENSORY: normal and symmetric to light touch  COORDINATION: finger-nose-finger normal     ASSESSMENT [name] is a 79 y.o. male presenting with visual changes and altered mental status. No t-PA as the patient was out of the therapeutic window. A CT scan showed an old thalamic infarct but no acute infarct. An MRI is pending.   On clopidogrel 75 mg orally every day and ASA per family.prior to admission. Now on clopidogrel 75 mg orally every day and ASA for secondary stroke prevention.  Patient with resultant continued confusion. Work up underway.   History of dementia  LDL 126 - on Lipitor and  Lopid.  Hemoglobin A1c - 6.0  Hypertension history  Previous CVA 02/10/2011  Previous TIA  Hospital day # 1  TREATMENT/PLAN  Continue Plavix for secondary stroke prevention.  Await therapists evaluations  Await MRI  Await 2-D echo and carotid Dopplers.  Mikey Bussing PA-C [facility] Pager [phone] 05/15/2012, 9:35 AM  I have personally obtained a history, examined the patient, evaluated imaging results, and formulated the assessment and plan of care. I agree with the above.

## 2012-05-15 NOTE — Progress Notes (Signed)
  Echocardiogram 2D Echocardiogram has been performed.  Lance Ramirez 05/15/2012, 11:42 AM

## 2012-05-15 NOTE — Progress Notes (Signed)
VASCULAR LAB PRELIMINARY  PRELIMINARY  PRELIMINARY  PRELIMINARY  Carotid Dopplers completed.    Preliminary report:  There is no ICA stenosis.  Vertebral artery flow is antegrade.  KANADY, CANDACE, RVT 05/15/2012, 10:07 AM

## 2012-05-15 NOTE — Progress Notes (Addendum)
PT/OT Cancellation Note  Patient Details Name: Lance Ramirez MRN: [id number] DOB: [birth year]   Cancelled Treatment:    Reason Eval/Treat Not Completed: Patient at procedure or test/ unavailable (MRI). Will re-attempt next date.  05/15/2012 Darrol Jump OTR/L Pager [phone] Office [phone]

## 2012-05-16 LAB — HEMOGLOBIN A1C: Hgb A1c MFr Bld: 6.2 % — ABNORMAL HIGH (ref <5.7)

## 2012-05-16 NOTE — Progress Notes (Signed)
[facility] PROGRESS NOTE  Lance Ramirez [id number] DOB: 30-Mar-1933 DOA: 05/14/2012 PCP: Alesia Richards, MD  Assessment/Plan:  CVA (cerebral infarction):  - HgbA1c, fasting lipid panel  - MRI, MRA of the brain without contrast  Showed PCA artery infarct.  - PT consult, OT consult, Speech consult recommended no outpatient follow up.  - Echocardiogram pending - Carotid dopplers negative for significant stenosis.  - Prophylactic therapy-we'll have to discuss with neurology probably a good candidate for Aggrenox as he has been - previously on aspirin and Plavix.  - risk factor modification  - Cardiac Monitoring  - Neurochecks q4h  - Keep MAP 70  Hypertension:  Allow some degree of permissive hypertension. Hyperlipidemia:  - statin and fenofibrate .  Neuro hospitalist  Code Status: Full code  Family Communication: none at bedside.  Disposition Plan: can discharge home if ECHO is normal.    Consultants:  neuro  HPI/Subjective: Comfortable lying in bed no new complaints .   Objective: Filed Vitals:   05/16/12 0345 05/16/12 0621 05/16/12 1020 05/16/12 1837  BP: 130/65 126/60 148/75 146/75  Pulse: 66 65 70 76  Temp: 97.5 F (36.4 C) 97.8 F (36.6 C) 98.2 F (36.8 C) 98.7 F (37.1 C)  TempSrc: Oral Oral    Resp: 20 18 20 20   Height:      Weight:      SpO2: 99% 98% 100% 100%    Intake/Output Summary (Last 24 hours) at 05/16/12 1911 Last data filed at 05/15/12 2100  Gross per 24 hour  Intake    120 ml  Output      0 ml  Net    120 ml   Filed Weights   05/14/12 2056  Weight: 83.462 kg (184 lb)    Lungs: Clear to auscultation bilaterally, respirations unlabored Heart: Regular rate and rhythm, S1 and S2 normal, no murmur, rub or gallop  Abdomen: Soft, non-tender, bowel sounds active all four quadrants,  no masses, no organomegaly  Extremities: Extremities normal, atraumatic, no cyanosis or edema    Data Reviewed: Basic Metabolic  Panel:  Recent Labs Lab 05/14/12 1400 05/14/12 2051  NA 142  --   K 3.7  --   CL 104  --   CO2 28  --   GLUCOSE 116*  --   BUN 16  --   CREATININE 1.19 1.23  CALCIUM 9.5  --    Liver Function Tests:  Recent Labs Lab 05/14/12 1400  AST 16  ALT 15  ALKPHOS 58  BILITOT 0.5  PROT 6.5  ALBUMIN 3.6   No results found for this basename: LIPASE, AMYLASE,  in the last 168 hours No results found for this basename: AMMONIA,  in the last 168 hours CBC:  Recent Labs Lab 05/14/12 1400 05/14/12 2051  WBC 4.3 4.9  HGB 14.8 14.1  HCT 41.7 40.3  MCV 83.4 83.8  PLT 165 155   Cardiac Enzymes: No results found for this basename: CKTOTAL, CKMB, CKMBINDEX, TROPONINI,  in the last 168 hours BNP (last 3 results) No results found for this basename: PROBNP,  in the last 8760 hours CBG:  Recent Labs Lab 05/14/12 1351 05/14/12 2212 05/15/12 0644  GLUCAP 114* 131* 102*    No results found for this or any previous visit (from the past 240 hour(s)).   Studies: Dg Chest 2 View  05/15/2012  *RADIOLOGY REPORT*  Clinical Data: Stroke  CHEST - 2 VIEW  Comparison: Prior study 02/10/2011  Findings: The lungs are clear.  Unchanged cardiomegaly with left ventricular prominence.  The thoracic aorta is tortuous, ectatic and atherosclerotic.  Coarse calcifications are noted on the mitral valve annulus.  Rounded density in the right paratracheal region likely reflects an underlying vascular structure.  This is similar to prior.  Incompletely imaged anterior cervical fusion hardware. Degenerative changes of the right acromioclavicular joint with calcification of the coracoclavicular ligament suggesting prior AC joint injury.  No acute osseous abnormality.  IMPRESSION: No acute cardiopulmonary disease.   Original Report Authenticated By: Jacqulynn Cadet, M.D.    Mr Brain Wo Contrast  05/15/2012  *RADIOLOGY REPORT*  Clinical Data:  Mental status change.  Left arm numbness  MRI HEAD WITHOUT CONTRAST MRA  HEAD WITHOUT CONTRAST  Technique:  Multiplanar, multiecho pulse sequences of the brain and surrounding structures were obtained without intravenous contrast. Angiographic images of the head were obtained using MRA technique without contrast.  Comparison:  CT 05/14/2012  MRI HEAD  Findings:  Acute infarct in the right posterior cerebral artery territory.  Acute infarct in the right hippocampus, right occipital white matter, and a small area of acute infarct in the right thalamus.  Generalized atrophy.  Chronic microvascular ischemic changes in the cerebral white matter.  Chronic lacunar infarction left thalamus. Brainstem and cerebellum are intact.  Negative for hemorrhage or mass.  Ventricle size is not enlarged and there is no midline shift.  IMPRESSION: Acute infarct right PCA territory  MRA HEAD  Findings: Both vertebral arteries are patent to the basilar.  The basilar is patent.  Superior cerebellar arteries are patent bilaterally.  There is occlusion of the right posterior cerebral artery at the origin.  There is a high-grade stenosis of the proximal left PCA which then occludes.  This is unchanged from 02/11/2011.  Atherosclerotic irregularity in the cavernous carotid bilaterally without significant stenosis.  Atherosclerotic disease is present in the M1 segment bilaterally without flow limiting stenosis. Middle cerebral artery branches are patent bilaterally.  Both anterior cerebral arteries are patent.  Negative for aneurysm.  IMPRESSION: Occlusion of the right posterior cerebral artery at the origin compatible with acute infarct.  High-grade stenosis followed by occlusion of the left posterior cerebral artery, unchanged from 2012.  Mild atherosclerotic disease in the cavernous carotid and M1 segments bilaterally.   Original Report Authenticated By: Carl Best, M.D.    Mr Mra Head/brain Wo Cm  05/15/2012  *RADIOLOGY REPORT*  Clinical Data:  Mental status change.  Left arm numbness  MRI HEAD WITHOUT  CONTRAST MRA HEAD WITHOUT CONTRAST  Technique:  Multiplanar, multiecho pulse sequences of the brain and surrounding structures were obtained without intravenous contrast. Angiographic images of the head were obtained using MRA technique without contrast.  Comparison:  CT 05/14/2012  MRI HEAD  Findings:  Acute infarct in the right posterior cerebral artery territory.  Acute infarct in the right hippocampus, right occipital white matter, and a small area of acute infarct in the right thalamus.  Generalized atrophy.  Chronic microvascular ischemic changes in the cerebral white matter.  Chronic lacunar infarction left thalamus. Brainstem and cerebellum are intact.  Negative for hemorrhage or mass.  Ventricle size is not enlarged and there is no midline shift.  IMPRESSION: Acute infarct right PCA territory  MRA HEAD  Findings: Both vertebral arteries are patent to the basilar.  The basilar is patent.  Superior cerebellar arteries are patent bilaterally.  There is occlusion of the right posterior cerebral artery at the origin.  There is a high-grade stenosis of the proximal left  PCA which then occludes.  This is unchanged from 02/11/2011.  Atherosclerotic irregularity in the cavernous carotid bilaterally without significant stenosis.  Atherosclerotic disease is present in the M1 segment bilaterally without flow limiting stenosis. Middle cerebral artery branches are patent bilaterally.  Both anterior cerebral arteries are patent.  Negative for aneurysm.  IMPRESSION: Occlusion of the right posterior cerebral artery at the origin compatible with acute infarct.  High-grade stenosis followed by occlusion of the left posterior cerebral artery, unchanged from 2012.  Mild atherosclerotic disease in the cavernous carotid and M1 segments bilaterally.   Original Report Authenticated By: Carl Best, M.D.     Scheduled Meds: . amphetamine-dextroamphetamine  20 mg Oral BID  . aspirin EC  81 mg Oral Daily  . atenolol  100 mg Oral  Daily  . atorvastatin  10 mg Oral q1800  . buPROPion  37.5 mg Oral Q1200  . buPROPion  75 mg Oral QAC breakfast  . clopidogrel  75 mg Oral Daily  . fenofibrate  160 mg Oral Daily  . heparin  5,000 Units Subcutaneous Q8H  . losartan  100 mg Oral QHS  . sodium chloride  3 mL Intravenous Q12H  . testosterone cypionate  200 mg Intramuscular Q6 weeks   Continuous Infusions:   Principal Problem:   CVA (cerebral infarction) Active Problems:   Hypertension   Hyperlipidemia       Vedha Tercero  [facility] Pager [phone]. If 7PM-7AM, please contact night-coverage at [email or web address], password [facility] 05/16/2012, 7:11 PM  LOS: 2 days

## 2012-05-16 NOTE — Progress Notes (Signed)
Stroke Team Progress Note  HISTORY Lance Ramirez is a 79 y.o. male who LKW Tuesday 05/11/2012. That evening he started having visual changes bilaterally that lasted about a half hour. He then went to bed. The next day he remained sleepy all day. He did go to his doctor and was told that his exam was good. Later that evening he showed signs of confusion. At baseline, he does have mild dementia according to the family. On 05/12/04 he was upset that his family did not tell him that a dear friend had died 2 weeks earlier; however the family had told him. Again on 05/14/12, he did not remember this as Dr. Nicole Ramirez discussed this in the room. He knew his age, but not the month. History of previous stroke with question of mild chronic left sided weakness per family. Most of history provided by family. Patient very tearful. Family denies any seizure type events.   Date last known well: 05/11/2012  Time last known well: 2100  tPA Given: No: out of window  SUBJECTIVE There no family members present this morning. The patient remains very confused and demonstrates unsafe behaviors in moving about the room.   OBJECTIVE Most recent Vital Signs: Filed Vitals:   05/15/12 1816 05/15/12 2102 05/16/12 0345 05/16/12 0621  BP: 143/64 153/79 130/65 126/60  Pulse: 76 76 66 65  Temp: 98.7 F (37.1 C) 98.5 F (36.9 C) 97.5 F (36.4 C) 97.8 F (36.6 C)  TempSrc: Oral Oral Oral Oral  Resp: 18 18 20 18   Height:      Weight:      SpO2: 97% 98% 99% 98%   CBG (last 3)   Recent Labs  05/14/12 1351 05/14/12 2212 05/15/12 0644  GLUCAP 114* 131* 102*    IV Fluid Intake:     MEDICATIONS  . amphetamine-dextroamphetamine  20 mg Oral BID  . aspirin EC  81 mg Oral Daily  . atenolol  100 mg Oral Daily  . atorvastatin  10 mg Oral q1800  . buPROPion  37.5 mg Oral Q1200  . buPROPion  75 mg Oral QAC breakfast  . clopidogrel  75 mg Oral Daily  . fenofibrate  160 mg Oral Daily  . heparin  5,000 Units Subcutaneous  Q8H  . losartan  100 mg Oral QHS  . sodium chloride  3 mL Intravenous Q12H  . testosterone cypionate  200 mg Intramuscular Q6 weeks   PRN:  sodium chloride, ondansetron (ZOFRAN) IV, senna-docusate, sodium chloride  Diet:  Cardiac with thin liquids. Activity:  Bedrest with bathroom privileges DVT Prophylaxis:  Subcutaneous heparin  CLINICALLY SIGNIFICANT STUDIES Basic Metabolic Panel:   Recent Labs Lab 05/14/12 1400 05/14/12 2051  NA 142  --   K 3.7  --   CL 104  --   CO2 28  --   GLUCOSE 116*  --   BUN 16  --   CREATININE 1.19 1.23  CALCIUM 9.5  --    Liver Function Tests:   Recent Labs Lab 05/14/12 1400  AST 16  ALT 15  ALKPHOS 58  BILITOT 0.5  PROT 6.5  ALBUMIN 3.6   CBC:   Recent Labs Lab 05/14/12 1400 05/14/12 2051  WBC 4.3 4.9  HGB 14.8 14.1  HCT 41.7 40.3  MCV 83.4 83.8  PLT 165 155   Coagulation:   Recent Labs Lab 05/14/12 1400  LABPROT 13.8  INR 1.07   Cardiac Enzymes: No results found for this basename: CKTOTAL, CKMB, CKMBINDEX, TROPONINI,  in the  last 168 hours Urinalysis: No results found for this basename: COLORURINE, APPERANCEUR, LABSPEC, [place], GLUCOSEU, HGBUR, BILIRUBINUR, KETONESUR, PROTEINUR, UROBILINOGEN, NITRITE, LEUKOCYTESUR,  in the last 168 hours Lipid Panel    Component Value Date/Time   CHOL 191 05/15/2012 0555   TRIG 75 05/15/2012 0555   HDL 50 05/15/2012 0555   CHOLHDL 3.8 05/15/2012 0555   VLDL 15 05/15/2012 0555   LDLCALC 126* 05/15/2012 0555   HgbA1C  Lab Results  Component Value Date   HGBA1C 6.0* 02/11/2011    Urine Drug Screen:   No results found for this basename: labopia,  cocainscrnur,  labbenz,  amphetmu,  thcu,  labbarb    Alcohol Level: No results found for this basename: ETH,  in the last 168 hours  Dg Chest 2 View 05/15/12 IMPRESSION: No acute cardiopulmonary disease.    Ct Head Wo Contrast (only If Suspected Head Trauma And/or Pt Is On Anticoagulant) 05/14/12 IMPRESSION: No evidence of acute intracranial  abnormality.  Old left thalamic lacunar infarct.  Atrophy with small vessel ischemic changes and intracranial atherosclerosis.    MRI of the brain  Acute infarct right PCA territory.   MRA of the brain  Occlusion of the right posterior cerebral artery at the origin  compatible with acute infarct. High-grade stenosis followed by occlusion of the left posterior  cerebral artery, unchanged from 2012.  2D Echocardiogram  pending  Carotid Doppler  Preliminary report: There is no ICA stenosis. Vertebral artery flow is antegrade.  EKG  sinus rhythm rate 73 beats per minute.  Therapy Recommendations no followup therapy - 24-hour per day supervision recommended  Physical Exam   General -  Heart - Regular rate and rhythm - no murmer Lungs - Clear to auscultation Abdomen - Soft - non tender Extremities - Distal pulses intact - no edema Skin - Warm and dry  NEUROLOGIC:   MENTAL STATUS: awake, alert, Language fluent Follows simple commands. CRANIAL NERVES: pupils equal and reactive to light,extraocular muscles intact, facial sensation and strength symmetric, uvula midlinec, tongue midline MOTOR: normal bulk and tone - motor strength 5 over 5 throughout. SENSORY: normal and symmetric to light touch  COORDINATION: finger-nose-finger normal     ASSESSMENT  [name] is a 79 y.o. male presenting with visual changes and altered mental status.  No t-PA as the patient was out of the therapeutic window.  A CT scan showed an old thalamic infarct but no acute infarct.   MRI - Acute infarct right PCA territory.  MRA - Occlusion of the right posterior cerebral artery at the origin        compatible with acute infarct. High-grade stenosis followed by occlusion of the left posterior cerebral artery.  On clopidogrel 75 mg orally every day and ASA per family.prior to admission. Now on clopidogrel 75 mg orally every day and ASA 81 mg for secondary stroke prevention.   Patient with  resultant continued confusion.  Work up completed except for pending 2-D echo   History of dementia  LDL 126 - on Lipitor and Lopid.  Hemoglobin A1c - 6.0  Hypertension history  Previous CVA 02/10/2011  Previous TIA  Hospital day # 2  TREATMENT/PLAN  Therapy recommendations - 24-hour per day supervision but no further physical or occupational therapy.  Continue Plavix and aspirin 81 mg for secondary stroke prevention.  Await 2-D echo  Pt okay for discharge today.  Followup Dr. Leonie Man in one month.  Mikey Bussing PA-C [facility] Pager [phone] 05/16/2012, 9:19  AM  I have personally obtained a history, examined the patient, evaluated imaging results, and formulated the assessment and plan of care. I agree with the above.

## 2012-05-17 MED ORDER — ATORVASTATIN CALCIUM 10 MG PO TABS: 10.0000 mg | ORAL_TABLET | Freq: Every day | ORAL | Status: DC

## 2012-05-17 MED ORDER — FENOFIBRATE 160 MG PO TABS: 160.0000 mg | ORAL_TABLET | Freq: Every day | ORAL | Status: DC

## 2012-05-17 MED ORDER — SENNOSIDES-DOCUSATE SODIUM 8.6-50 MG PO TABS: 1.0000 | ORAL_TABLET | Freq: Every evening | ORAL | Status: DC | PRN

## 2012-05-17 NOTE — Discharge Summary (Signed)
Physician Discharge Summary  Lance Ramirez [id number] DOB: 1933/06/22 DOA: 05/14/2012  PCP: Alesia Richards, MD  Admit date: 05/14/2012 Discharge date: 05/17/2012  Time spent:  32 minutes  Recommendations for Outpatient Follow-up:  1. Follow up with neurology as recommended.  Discharge Diagnoses:  Principal Problem:   CVA (cerebral infarction) Active Problems:   Hypertension   Hyperlipidemia   Discharge Condition: fair.   Diet recommendation: low salt diet   Filed Weights   05/14/12 2056  Weight: 83.462 kg (184 lb)    History of present illness:  Lance Ramirez is a 79 y.o. male who LKW Tuesday 05/11/2012. That evening he started having visual changes bilaterally that lasted about a half hour. He then went to bed. The next day he remained sleepy all day. He did go to his doctor and was told that his exam was good. Later that evening he showed signs of confusion. At baseline, he does have mild dementia according to the family. On 05/12/04 he was upset that his family did not tell him that a dear friend had died 2 weeks earlier; however the family had told him. Again on 05/14/12, he did not remember this as Dr. Nicole Kindred discussed this in the room. He knew his age, but not the month. History of previous stroke with question of mild chronic left sided weakness per family.   Hospital Course:    CVA (cerebral infarction): - she was admitted to telemetry and  MRI, MRA of the brain without contrast showed Showed PCA artery infarct.  - HgbA1c is 6 , fasting lipid panel showed LDL of 126 on statin and fenofibrate.   - PT consult, OT consult, Speech consult recommended no outpatient follow up.  - Echocardiogram was insignificant for intracardiac embolus.  - Carotid dopplers negative for significant stenosis.  She was started on plavix and discharged on the same.    Hypertension: resume home medications.   Consultations:  Neurology consult.   Discharge Exam: Filed Vitals:    05/16/12 2154 05/17/12 0151 05/17/12 0503 05/17/12 1040  BP: 147/83 155/67 149/64 175/72  Pulse: 75 67 61 67  Temp: 98.7 F (37.1 C) 97.4 F (36.3 C) 97.7 F (36.5 C) 97.7 F (36.5 C)  TempSrc: Oral Oral Oral Oral  Resp: 18 18 17 18   Height:      Weight:      SpO2: 100% 98% 100% 99%   Lungs: Clear to auscultation bilaterally, respirations unlabored  Heart: Regular rate and rhythm, S1 and S2 normal, no murmur, rub or gallop  Abdomen: Soft, non-tender, bowel sounds active all four quadrants,  no masses, no organomegaly  Extremities: Extremities normal, atraumatic, no cyanosis or edema    Discharge Instructions  Discharge Orders   Future Appointments [facility] Department Dept Phone   05/19/2012 3:45 PM Wellington Hampshire, MD [place]) [phone]   Future Orders Complete By Expires     Activity as tolerated - No restrictions  As directed     Call MD for:  extreme fatigue  As directed     Call MD for:  persistant dizziness or light-headedness  As directed     Call MD for:  severe uncontrolled pain  As directed     Diet - low sodium heart healthy  As directed     Discharge instructions  As directed     Comments:      Follow up with PCP AND cardiology as scheduled.        Medication List  STOP taking these medications       gemfibrozil 600 MG tablet  Commonly known as:  LOPID      TAKE these medications       amphetamine-dextroamphetamine 20 MG tablet  Commonly known as:  ADDERALL  Take 20 mg by mouth 2 (two) times daily.     aspirin EC 81 MG tablet  Take 81 mg by mouth daily.     atenolol 100 MG tablet  Commonly known as:  TENORMIN  Take 100 mg by mouth daily.     atorvastatin 10 MG tablet  Commonly known as:  LIPITOR  Take 1 tablet (10 mg total) by mouth daily at 6 PM.     BUPROPION HCL PO  Take 0.5-1 tablets by mouth 2 (two) times daily. 1 tablet in the morning and 0.5 tablet with lunch     clopidogrel 75 MG tablet  Commonly  known as:  PLAVIX  Take 75 mg by mouth daily.     doxazosin 4 MG tablet  Commonly known as:  CARDURA  Take 4 mg by mouth at bedtime.     fenofibrate 160 MG tablet  Take 1 tablet (160 mg total) by mouth daily.     hydrochlorothiazide 25 MG tablet  Commonly known as:  HYDRODIURIL  Take 25 mg by mouth daily.     losartan 100 MG tablet  Commonly known as:  COZAAR  Take 100 mg by mouth at bedtime.     minoxidil 10 MG tablet  Commonly known as:  LONITEN  Take 10 mg by mouth at bedtime.     senna-docusate 8.6-50 MG per tablet  Commonly known as:  Senokot-S  Take 1 tablet by mouth at bedtime as needed.     testosterone cypionate 100 MG/ML injection  Commonly known as:  DEPOTESTOTERONE CYPIONATE  Inject 200 mg into the muscle every 6 (six) weeks. For IM use only     VITAMIN D PO  Take 5,000 Units by mouth daily.          The results of significant diagnostics from this hospitalization (including imaging, microbiology, ancillary and laboratory) are listed below for reference.    Significant Diagnostic Studies: Dg Chest 2 View  05/15/2012  *RADIOLOGY REPORT*  Clinical Data: Stroke  CHEST - 2 VIEW  Comparison: Prior study 02/10/2011  Findings: The lungs are clear.  Unchanged cardiomegaly with left ventricular prominence.  The thoracic aorta is tortuous, ectatic and atherosclerotic.  Coarse calcifications are noted on the mitral valve annulus.  Rounded density in the right paratracheal region likely reflects an underlying vascular structure.  This is similar to prior.  Incompletely imaged anterior cervical fusion hardware. Degenerative changes of the right acromioclavicular joint with calcification of the coracoclavicular ligament suggesting prior AC joint injury.  No acute osseous abnormality.  IMPRESSION: No acute cardiopulmonary disease.   Original Report Authenticated By: Jacqulynn Cadet, M.D.    Ct Head Wo Contrast (only If Suspected Head Trauma And/or Pt Is On  Anticoagulant)  05/14/2012  *RADIOLOGY REPORT*  Clinical Data: Left-sided numbness, history of TIA/stroke  CT HEAD WITHOUT CONTRAST  Technique:  Contiguous axial images were obtained from the base of the skull through the vertex without contrast.  Comparison: MRI brain dated 02/11/2011  Findings: No evidence of parenchymal hemorrhage or extra-axial fluid collection. No mass lesion, mass effect, or midline shift.  No CT evidence of acute infarction.  Old left thalamic lacunar infarct.  Subcortical white matter and periventricular small vessel ischemic changes.  Intracranial atherosclerosis.  Mild global cortical atrophy.  No ventriculomegaly.  The visualized paranasal sinuses are essentially clear. The mastoid air cells are unopacified.  No evidence of calvarial fracture.  IMPRESSION: No evidence of acute intracranial abnormality.  Old left thalamic lacunar infarct.  Atrophy with small vessel ischemic changes and intracranial atherosclerosis.   Original Report Authenticated By: Julian Hy, M.D.    Mr Brain Wo Contrast  05/15/2012  *RADIOLOGY REPORT*  Clinical Data:  Mental status change.  Left arm numbness  MRI HEAD WITHOUT CONTRAST MRA HEAD WITHOUT CONTRAST  Technique:  Multiplanar, multiecho pulse sequences of the brain and surrounding structures were obtained without intravenous contrast. Angiographic images of the head were obtained using MRA technique without contrast.  Comparison:  CT 05/14/2012  MRI HEAD  Findings:  Acute infarct in the right posterior cerebral artery territory.  Acute infarct in the right hippocampus, right occipital white matter, and a small area of acute infarct in the right thalamus.  Generalized atrophy.  Chronic microvascular ischemic changes in the cerebral white matter.  Chronic lacunar infarction left thalamus. Brainstem and cerebellum are intact.  Negative for hemorrhage or mass.  Ventricle size is not enlarged and there is no midline shift.  IMPRESSION: Acute infarct right  PCA territory  MRA HEAD  Findings: Both vertebral arteries are patent to the basilar.  The basilar is patent.  Superior cerebellar arteries are patent bilaterally.  There is occlusion of the right posterior cerebral artery at the origin.  There is a high-grade stenosis of the proximal left PCA which then occludes.  This is unchanged from 02/11/2011.  Atherosclerotic irregularity in the cavernous carotid bilaterally without significant stenosis.  Atherosclerotic disease is present in the M1 segment bilaterally without flow limiting stenosis. Middle cerebral artery branches are patent bilaterally.  Both anterior cerebral arteries are patent.  Negative for aneurysm.  IMPRESSION: Occlusion of the right posterior cerebral artery at the origin compatible with acute infarct.  High-grade stenosis followed by occlusion of the left posterior cerebral artery, unchanged from 2012.  Mild atherosclerotic disease in the cavernous carotid and M1 segments bilaterally.   Original Report Authenticated By: Carl Best, M.D.    Mr Mra Head/brain Wo Cm  05/15/2012  *RADIOLOGY REPORT*  Clinical Data:  Mental status change.  Left arm numbness  MRI HEAD WITHOUT CONTRAST MRA HEAD WITHOUT CONTRAST  Technique:  Multiplanar, multiecho pulse sequences of the brain and surrounding structures were obtained without intravenous contrast. Angiographic images of the head were obtained using MRA technique without contrast.  Comparison:  CT 05/14/2012  MRI HEAD  Findings:  Acute infarct in the right posterior cerebral artery territory.  Acute infarct in the right hippocampus, right occipital white matter, and a small area of acute infarct in the right thalamus.  Generalized atrophy.  Chronic microvascular ischemic changes in the cerebral white matter.  Chronic lacunar infarction left thalamus. Brainstem and cerebellum are intact.  Negative for hemorrhage or mass.  Ventricle size is not enlarged and there is no midline shift.  IMPRESSION: Acute infarct  right PCA territory  MRA HEAD  Findings: Both vertebral arteries are patent to the basilar.  The basilar is patent.  Superior cerebellar arteries are patent bilaterally.  There is occlusion of the right posterior cerebral artery at the origin.  There is a high-grade stenosis of the proximal left PCA which then occludes.  This is unchanged from 02/11/2011.  Atherosclerotic irregularity in the cavernous carotid bilaterally without significant stenosis.  Atherosclerotic disease is present in  the M1 segment bilaterally without flow limiting stenosis. Middle cerebral artery branches are patent bilaterally.  Both anterior cerebral arteries are patent.  Negative for aneurysm.  IMPRESSION: Occlusion of the right posterior cerebral artery at the origin compatible with acute infarct.  High-grade stenosis followed by occlusion of the left posterior cerebral artery, unchanged from 2012.  Mild atherosclerotic disease in the cavernous carotid and M1 segments bilaterally.   Original Report Authenticated By: Carl Best, M.D.     Microbiology: No results found for this or any previous visit (from the past 240 hour(s)).   Labs: Basic Metabolic Panel:  Recent Labs Lab 05/14/12 1400 05/14/12 2051  NA 142  --   K 3.7  --   CL 104  --   CO2 28  --   GLUCOSE 116*  --   BUN 16  --   CREATININE 1.19 1.23  CALCIUM 9.5  --    Liver Function Tests:  Recent Labs Lab 05/14/12 1400  AST 16  ALT 15  ALKPHOS 58  BILITOT 0.5  PROT 6.5  ALBUMIN 3.6   No results found for this basename: LIPASE, AMYLASE,  in the last 168 hours No results found for this basename: AMMONIA,  in the last 168 hours CBC:  Recent Labs Lab 05/14/12 1400 05/14/12 2051  WBC 4.3 4.9  HGB 14.8 14.1  HCT 41.7 40.3  MCV 83.4 83.8  PLT 165 155   Cardiac Enzymes: No results found for this basename: CKTOTAL, CKMB, CKMBINDEX, TROPONINI,  in the last 168 hours BNP: BNP (last 3 results) No results found for this basename: PROBNP,  in  the last 8760 hours CBG:  Recent Labs Lab 05/14/12 1351 05/14/12 2212 05/15/12 0644  GLUCAP 114* 131* 102*       Signed:  AKULA,VIJAYA  [facility] 05/17/2012, 11:22 AM

## 2012-05-17 NOTE — Progress Notes (Signed)
Patient evaluated for long-term disease management services with [facility] as a benefit of his [employer].Spoke with patient and wife prior to discharge and they declined services at this time. Left [facility] Care Management brochure and contact information for them to call if they should change their mind. Declined post transition of care call as well. Both patient and wife state they will manage fine at home and will contact [place] [facility] if needed.   Natalia Leatherwood, RN,BSN, [facility], [phone]

## 2012-05-18 NOTE — Progress Notes (Signed)
   CARE MANAGEMENT NOTE 05/18/2012  Patient:  Lance Ramirez, Lance Ramirez   Account Number:  [id number]  Date Initiated:  05/17/2012  Documentation initiated by:  [facility]  Subjective/Objective Assessment:   admitted with CVA     Action/Plan:   PT/OT evals-no follow up recommended   Anticipated DC Date:  05/17/2012   Anticipated DC Plan:  [place]  CM consult      Choice offered to / List presented to:             Status of service:  Completed, signed off Medicare Important Message given?   (If response is "NO", the following Medicare IM given date fields will be blank) Date Medicare IM given:   Date Additional Medicare IM given:    Discharge Disposition:  HOME/SELF CARE  Per UR Regulation:  Reviewed for med. necessity/level of care/duration of stay  If discussed at [place] of Stay Meetings, dates discussed:    Comments:

## 2012-05-19 ENCOUNTER — Ambulatory Visit: Payer: Medicare Other | Admitting: Cardiovascular Disease

## 2012-05-27 ENCOUNTER — Encounter: Payer: Self-pay | Admitting: Cardiovascular Disease

## 2012-06-24 ENCOUNTER — Ambulatory Visit: Payer: Self-pay | Admitting: Neurology

## 2012-08-30 ENCOUNTER — Other Ambulatory Visit: Payer: Self-pay | Admitting: Neurosurgery

## 2012-08-30 DIAGNOSIS — M5412 Radiculopathy, cervical region: Secondary | ICD-10-CM

## 2012-09-04 ENCOUNTER — Ambulatory Visit
Admission: RE | Admit: 2012-09-04 | Discharge: 2012-09-04 | Disposition: A | Discharge status: Home / Self Care | Payer: Medicare Other | Source: Ambulatory Visit | Attending: Neurosurgery | Admitting: Neurosurgery

## 2012-09-04 DIAGNOSIS — M5412 Radiculopathy, cervical region: Secondary | ICD-10-CM

## 2012-10-05 ENCOUNTER — Other Ambulatory Visit: Payer: Self-pay | Admitting: Neurosurgery

## 2012-10-12 ENCOUNTER — Encounter (HOSPITAL_COMMUNITY): Payer: Self-pay | Admitting: Pharmacy Technician

## 2012-10-15 ENCOUNTER — Encounter (HOSPITAL_COMMUNITY)
Admission: RE | Admit: 2012-10-15 | Discharge: 2012-10-15 | Disposition: A | Discharge status: Home / Self Care | Payer: Medicare Other | Source: Ambulatory Visit | Attending: Neurosurgery | Admitting: Neurosurgery

## 2012-10-15 ENCOUNTER — Encounter (HOSPITAL_COMMUNITY): Payer: Self-pay

## 2012-10-15 DIAGNOSIS — Z01812 Encounter for preprocedural laboratory examination: Secondary | ICD-10-CM | POA: Insufficient documentation

## 2012-10-15 DIAGNOSIS — Z01818 Encounter for other preprocedural examination: Secondary | ICD-10-CM | POA: Insufficient documentation

## 2012-10-15 HISTORY — DX: Disorientation, unspecified: R41.0

## 2012-10-15 HISTORY — DX: Personal history of other specified conditions: Z87.898

## 2012-10-15 LAB — CBC
HCT: 42.2 % (ref 39.0–52.0)
Hemoglobin: 14.7 g/dL (ref 13.0–17.0)
RBC: 4.94 MIL/uL (ref 4.22–5.81)
WBC: 5 10*3/uL (ref 4.0–10.5)

## 2012-10-15 LAB — BASIC METABOLIC PANEL
BUN: 22 mg/dL (ref 6–23)
CO2: 28 mEq/L (ref 19–32)
Chloride: 104 mEq/L (ref 96–112)
Glucose, Bld: 86 mg/dL (ref 70–99)
Potassium: 3.7 mEq/L (ref 3.5–5.1)

## 2012-10-15 LAB — SURGICAL PCR SCREEN: Staphylococcus aureus: NEGATIVE

## 2012-10-15 NOTE — Progress Notes (Signed)
Primary Physician- Dr. Ernestine Conrad Does not have a cardiologist Ekg, echo in epic from march 2014.

## 2012-10-15 NOTE — Pre-Procedure Instructions (Signed)
Lance Ramirez  10/15/2012   Your procedure is scheduled on:  Monday, August 18th  Report to [facility] at [place].  Call this number if you have problems the morning of surgery: [phone]   Remember:   Do not eat food or drink liquids after midnight.   Take these medicines the morning of surgery with A SIP OF WATER: atenolol Stop taking plavix and aspirin on 10/18/12   Do not wear jewelry.  Do not wear lotions, powders, or perfume, deodorant.  Do not shave 48 hours prior to surgery. Men may shave face and neck.  Do not bring valuables to the hospital.  [facility] is not responsible   for any belongings or valuables.  Contacts, dentures or bridgework may not be worn into surgery.  Leave suitcase in the car. After surgery it may be brought to your room.  For patients admitted to the hospital, checkout time is 11:00 AM the day of discharge.   Patients discharged the day of surgery will not be allowed to drive home.    Special Instructions: Shower using CHG 2 nights before surgery and the night before surgery.  If you shower the day of surgery use CHG.  Use special wash - you have one bottle of CHG for all showers.  You should use approximately 1/3 of the bottle for each shower.   Please read over the following fact sheets that you were given: Pain Booklet, Coughing and Deep Breathing, MRSA Information and Surgical Site Infection Prevention

## 2012-10-18 ENCOUNTER — Encounter (HOSPITAL_COMMUNITY): Payer: Self-pay
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
## 2012-10-24 MED ORDER — CEFAZOLIN SODIUM-DEXTROSE 2-3 GM-% IV SOLR
2.0000 g | INTRAVENOUS | Status: AC
  Administered 2012-10-25: 2 g via INTRAVENOUS
  Filled 2012-10-24: qty 50

## 2012-10-25 ENCOUNTER — Inpatient Hospital Stay (HOSPITAL_COMMUNITY): Payer: Medicare Other

## 2012-10-25 ENCOUNTER — Encounter (HOSPITAL_COMMUNITY): Payer: Self-pay | Admitting: Anesthesiology

## 2012-10-25 ENCOUNTER — Inpatient Hospital Stay (HOSPITAL_COMMUNITY): Payer: Medicare Other | Admitting: Anesthesiology

## 2012-10-25 ENCOUNTER — Encounter (HOSPITAL_COMMUNITY): Payer: Self-pay | Admitting: Vascular Surgery

## 2012-10-25 ENCOUNTER — Encounter (HOSPITAL_COMMUNITY)
Admission: RE | Disposition: A | Discharge status: Home / Self Care | Payer: Self-pay | Source: Ambulatory Visit | Attending: Neurosurgery

## 2012-10-25 ENCOUNTER — Inpatient Hospital Stay (HOSPITAL_COMMUNITY)
Admission: RE | Admit: 2012-10-25 | Discharge: 2012-10-27 | DRG: 472 | Disposition: A | Discharge status: Home / Self Care | Payer: Medicare Other | Source: Ambulatory Visit | Attending: Neurosurgery | Admitting: Neurosurgery

## 2012-10-25 DIAGNOSIS — M5000 Cervical disc disorder with myelopathy, unspecified cervical region: Secondary | ICD-10-CM | POA: Diagnosis present

## 2012-10-25 DIAGNOSIS — Z87891 Personal history of nicotine dependence: Secondary | ICD-10-CM

## 2012-10-25 DIAGNOSIS — Z7982 Long term (current) use of aspirin: Secondary | ICD-10-CM

## 2012-10-25 DIAGNOSIS — I10 Essential (primary) hypertension: Secondary | ICD-10-CM | POA: Diagnosis present

## 2012-10-25 DIAGNOSIS — Z7902 Long term (current) use of antithrombotics/antiplatelets: Secondary | ICD-10-CM

## 2012-10-25 DIAGNOSIS — K219 Gastro-esophageal reflux disease without esophagitis: Secondary | ICD-10-CM | POA: Diagnosis present

## 2012-10-25 DIAGNOSIS — Z8673 Personal history of transient ischemic attack (TIA), and cerebral infarction without residual deficits: Secondary | ICD-10-CM

## 2012-10-25 DIAGNOSIS — M4712 Other spondylosis with myelopathy, cervical region: Secondary | ICD-10-CM

## 2012-10-25 DIAGNOSIS — Z79899 Other long term (current) drug therapy: Secondary | ICD-10-CM

## 2012-10-25 HISTORY — PX: ANTERIOR CERVICAL DECOMP/DISCECTOMY FUSION: SHX1161

## 2012-10-25 SURGERY — ANTERIOR CERVICAL DECOMPRESSION/DISCECTOMY FUSION 1 LEVEL/HARDWARE REMOVAL
Anesthesia: General | Site: Spine Cervical | Wound class: Clean

## 2012-10-25 MED ORDER — SENNOSIDES-DOCUSATE SODIUM 8.6-50 MG PO TABS
1.0000 | ORAL_TABLET | Freq: Every evening | ORAL | Status: DC | PRN
  Filled 2012-10-25: qty 1

## 2012-10-25 MED ORDER — DOCUSATE SODIUM 100 MG PO CAPS
100.0000 mg | ORAL_CAPSULE | Freq: Two times a day (BID) | ORAL | Status: DC
  Administered 2012-10-25 – 2012-10-27 (×4): 100 mg via ORAL
  Filled 2012-10-25 (×3): qty 1

## 2012-10-25 MED ORDER — ARTIFICIAL TEARS OP OINT
TOPICAL_OINTMENT | OPHTHALMIC | Status: DC | PRN
  Administered 2012-10-25: 1 via OPHTHALMIC

## 2012-10-25 MED ORDER — ATENOLOL 100 MG PO TABS
100.0000 mg | ORAL_TABLET | Freq: Every day | ORAL | Status: DC
  Administered 2012-10-26 – 2012-10-27 (×2): 100 mg via ORAL
  Filled 2012-10-25 (×2): qty 1

## 2012-10-25 MED ORDER — HEMOSTATIC AGENTS (NO CHARGE) OPTIME
TOPICAL | Status: DC | PRN
  Administered 2012-10-25 (×2): 1 via TOPICAL

## 2012-10-25 MED ORDER — LIDOCAINE HCL 4 % MT SOLN
OROMUCOSAL | Status: DC | PRN
  Administered 2012-10-25: 4 mL via TOPICAL

## 2012-10-25 MED ORDER — LACTATED RINGERS IV SOLN
INTRAVENOUS | Status: DC
  Administered 2012-10-25 (×2): via INTRAVENOUS

## 2012-10-25 MED ORDER — PANTOPRAZOLE SODIUM 40 MG IV SOLR
40.0000 mg | Freq: Every day | INTRAVENOUS | Status: DC
  Administered 2012-10-25: 40 mg via INTRAVENOUS
  Filled 2012-10-25 (×2): qty 40

## 2012-10-25 MED ORDER — LACTATED RINGERS IV SOLN: INTRAVENOUS | Status: DC

## 2012-10-25 MED ORDER — MORPHINE SULFATE 2 MG/ML IJ SOLN
1.0000 mg | INTRAMUSCULAR | Status: DC | PRN
  Administered 2012-10-25: 2 mg via INTRAVENOUS
  Filled 2012-10-25: qty 1

## 2012-10-25 MED ORDER — THROMBIN 5000 UNITS EX SOLR
CUTANEOUS | Status: DC | PRN
  Administered 2012-10-25 (×4): 5000 [IU] via TOPICAL

## 2012-10-25 MED ORDER — ACETAMINOPHEN 325 MG PO TABS: 650.0000 mg | ORAL_TABLET | ORAL | Status: DC | PRN

## 2012-10-25 MED ORDER — BUPIVACAINE-EPINEPHRINE 0.5% -1:200000 IJ SOLN
INTRAMUSCULAR | Status: DC | PRN
  Administered 2012-10-25: 10 mL

## 2012-10-25 MED ORDER — AMPHETAMINE-DEXTROAMPHETAMINE 10 MG PO TABS
20.0000 mg | ORAL_TABLET | Freq: Two times a day (BID) | ORAL | Status: DC
  Administered 2012-10-26: 20 mg via ORAL
  Filled 2012-10-25 (×2): qty 2

## 2012-10-25 MED ORDER — DEXAMETHASONE SODIUM PHOSPHATE 4 MG/ML IJ SOLN
4.0000 mg | Freq: Four times a day (QID) | INTRAMUSCULAR | Status: AC
  Administered 2012-10-25: 4 mg via INTRAVENOUS
  Filled 2012-10-25 (×3): qty 1

## 2012-10-25 MED ORDER — SENNA-DOCUSATE SODIUM 8.6-50 MG PO TABS: 1.0000 | ORAL_TABLET | Freq: Every evening | ORAL | Status: DC | PRN

## 2012-10-25 MED ORDER — OXYCODONE-ACETAMINOPHEN 5-325 MG PO TABS
1.0000 | ORAL_TABLET | ORAL | Status: DC | PRN
  Administered 2012-10-26: 1 via ORAL
  Filled 2012-10-25: qty 1

## 2012-10-25 MED ORDER — ONDANSETRON HCL 4 MG/2ML IJ SOLN
INTRAMUSCULAR | Status: DC | PRN
  Administered 2012-10-25: 4 mg via INTRAVENOUS

## 2012-10-25 MED ORDER — MINOXIDIL 10 MG PO TABS
10.0000 mg | ORAL_TABLET | Freq: Every day | ORAL | Status: DC
  Administered 2012-10-25: 10 mg via ORAL
  Filled 2012-10-25 (×3): qty 1

## 2012-10-25 MED ORDER — DEXAMETHASONE SODIUM PHOSPHATE 4 MG/ML IJ SOLN
INTRAMUSCULAR | Status: DC | PRN
  Administered 2012-10-25: 8 mg via INTRAVENOUS

## 2012-10-25 MED ORDER — SODIUM CHLORIDE 0.9 % IV SOLN
INTRAVENOUS | Status: AC
  Filled 2012-10-25: qty 500

## 2012-10-25 MED ORDER — ACETAMINOPHEN 650 MG RE SUPP: 650.0000 mg | RECTAL | Status: DC | PRN

## 2012-10-25 MED ORDER — QUETIAPINE FUMARATE 25 MG PO TABS
25.0000 mg | ORAL_TABLET | Freq: Every day | ORAL | Status: DC
  Administered 2012-10-25 – 2012-10-26 (×2): 25 mg via ORAL
  Filled 2012-10-25 (×3): qty 1

## 2012-10-25 MED ORDER — LIDOCAINE HCL (CARDIAC) 20 MG/ML IV SOLN
INTRAVENOUS | Status: DC | PRN
  Administered 2012-10-25: 70 mg via INTRAVENOUS

## 2012-10-25 MED ORDER — LOSARTAN POTASSIUM 50 MG PO TABS
100.0000 mg | ORAL_TABLET | Freq: Every day | ORAL | Status: DC
  Administered 2012-10-25: 100 mg via ORAL
  Filled 2012-10-25 (×3): qty 2

## 2012-10-25 MED ORDER — SODIUM CHLORIDE 0.9 % IR SOLN
Status: DC | PRN
  Administered 2012-10-25: 11:00:00

## 2012-10-25 MED ORDER — DEXTROSE 5 % IV SOLN
10.0000 mg | INTRAVENOUS | Status: DC | PRN
  Administered 2012-10-25: 30 ug/min via INTRAVENOUS

## 2012-10-25 MED ORDER — NEOSTIGMINE METHYLSULFATE 1 MG/ML IJ SOLN
INTRAMUSCULAR | Status: DC | PRN
  Administered 2012-10-25: 5 mg via INTRAVENOUS

## 2012-10-25 MED ORDER — CEFAZOLIN SODIUM-DEXTROSE 2-3 GM-% IV SOLR
2.0000 g | Freq: Three times a day (TID) | INTRAVENOUS | Status: AC
  Administered 2012-10-25 – 2012-10-26 (×2): 2 g via INTRAVENOUS
  Filled 2012-10-25 (×2): qty 50

## 2012-10-25 MED ORDER — MENTHOL 3 MG MT LOZG
1.0000 | LOZENGE | OROMUCOSAL | Status: DC | PRN
  Administered 2012-10-25 – 2012-10-26 (×2): 3 mg via ORAL
  Filled 2012-10-25: qty 9

## 2012-10-25 MED ORDER — PHENYLEPHRINE HCL 10 MG/ML IJ SOLN
INTRAMUSCULAR | Status: DC | PRN
  Administered 2012-10-25 (×4): 80 ug via INTRAVENOUS
  Administered 2012-10-25: 40 ug via INTRAVENOUS

## 2012-10-25 MED ORDER — HYDROCODONE-ACETAMINOPHEN 5-325 MG PO TABS: 1.0000 | ORAL_TABLET | ORAL | Status: DC | PRN

## 2012-10-25 MED ORDER — ONDANSETRON HCL 4 MG/2ML IJ SOLN: 4.0000 mg | INTRAMUSCULAR | Status: DC | PRN

## 2012-10-25 MED ORDER — BACITRACIN ZINC 500 UNIT/GM EX OINT
TOPICAL_OINTMENT | CUTANEOUS | Status: DC | PRN
  Administered 2012-10-25: 1 via TOPICAL

## 2012-10-25 MED ORDER — ROCURONIUM BROMIDE 100 MG/10ML IV SOLN
INTRAVENOUS | Status: DC | PRN
  Administered 2012-10-25: 10 mg via INTRAVENOUS
  Administered 2012-10-25: 40 mg via INTRAVENOUS

## 2012-10-25 MED ORDER — 0.9 % SODIUM CHLORIDE (POUR BTL) OPTIME
TOPICAL | Status: DC | PRN
  Administered 2012-10-25: 1000 mL

## 2012-10-25 MED ORDER — BACITRACIN 50000 UNITS IM SOLR
INTRAMUSCULAR | Status: AC
  Filled 2012-10-25: qty 1

## 2012-10-25 MED ORDER — DEXAMETHASONE 4 MG PO TABS
4.0000 mg | ORAL_TABLET | Freq: Four times a day (QID) | ORAL | Status: AC
  Administered 2012-10-26 (×2): 4 mg via ORAL
  Filled 2012-10-25 (×3): qty 1

## 2012-10-25 MED ORDER — FENTANYL CITRATE 0.05 MG/ML IJ SOLN
25.0000 ug | INTRAMUSCULAR | Status: DC | PRN
  Administered 2012-10-25: 25 ug via INTRAVENOUS

## 2012-10-25 MED ORDER — GLYCOPYRROLATE 0.2 MG/ML IJ SOLN
INTRAMUSCULAR | Status: DC | PRN
  Administered 2012-10-25: .6 mg via INTRAVENOUS

## 2012-10-25 MED ORDER — DIAZEPAM 5 MG PO TABS
5.0000 mg | ORAL_TABLET | Freq: Four times a day (QID) | ORAL | Status: DC | PRN
  Administered 2012-10-25: 5 mg via ORAL
  Filled 2012-10-25: qty 1

## 2012-10-25 MED ORDER — FENTANYL CITRATE 0.05 MG/ML IJ SOLN
INTRAMUSCULAR | Status: DC | PRN
  Administered 2012-10-25 (×3): 50 ug via INTRAVENOUS

## 2012-10-25 MED ORDER — ALUM & MAG HYDROXIDE-SIMETH 200-200-20 MG/5ML PO SUSP
30.0000 mL | Freq: Four times a day (QID) | ORAL | Status: DC | PRN
  Administered 2012-10-26 (×2): 30 mL via ORAL
  Filled 2012-10-25 (×2): qty 30

## 2012-10-25 MED ORDER — SODIUM CHLORIDE 0.9 % IJ SOLN
INTRAMUSCULAR | Status: AC
  Administered 2012-10-25: 10 mL
  Filled 2012-10-25: qty 10

## 2012-10-25 MED ORDER — DOXAZOSIN MESYLATE 4 MG PO TABS
4.0000 mg | ORAL_TABLET | Freq: Every day | ORAL | Status: DC
  Administered 2012-10-25: 4 mg via ORAL
  Filled 2012-10-25 (×3): qty 1

## 2012-10-25 MED ORDER — PHENOL 1.4 % MT LIQD: 1.0000 | OROMUCOSAL | Status: DC | PRN

## 2012-10-25 MED ORDER — LACTATED RINGERS IV SOLN
INTRAVENOUS | Status: DC | PRN
  Administered 2012-10-25 (×2): via INTRAVENOUS

## 2012-10-25 MED ORDER — PROPOFOL 10 MG/ML IV BOLUS
INTRAVENOUS | Status: DC | PRN
  Administered 2012-10-25: 150 mg via INTRAVENOUS

## 2012-10-25 MED ORDER — GEMFIBROZIL 600 MG PO TABS
600.0000 mg | ORAL_TABLET | Freq: Two times a day (BID) | ORAL | Status: DC
  Administered 2012-10-25 – 2012-10-27 (×4): 600 mg via ORAL
  Filled 2012-10-25 (×7): qty 1

## 2012-10-25 SURGICAL SUPPLY — 74 items
BAG DECANTER FOR FLEXI CONT (MISCELLANEOUS) ×2 IMPLANT
BENZOIN TINCTURE PRP APPL 2/3 (GAUZE/BANDAGES/DRESSINGS) ×2 IMPLANT
BIT DRILL NEURO 2X3.1 SFT TUCH (MISCELLANEOUS) ×1 IMPLANT
BLADE SURG 15 STRL LF DISP TIS (BLADE) ×2 IMPLANT
BLADE SURG 15 STRL SS (BLADE) ×2
BLADE ULTRA TIP 2M (BLADE) ×2 IMPLANT
BRUSH SCRUB EZ PLAIN DRY (MISCELLANEOUS) ×2 IMPLANT
BUR BARREL STRAIGHT FLUTE 4.0 (BURR) ×2 IMPLANT
BUR MATCHSTICK NEURO 3.0 LAGG (BURR) ×2 IMPLANT
CANISTER SUCTION 2500CC (MISCELLANEOUS) ×2 IMPLANT
CLOTH BEACON ORANGE TIMEOUT ST (SAFETY) ×2 IMPLANT
CONT SPEC 4OZ CLIKSEAL STRL BL (MISCELLANEOUS) ×2 IMPLANT
COVER MAYO STAND STRL (DRAPES) ×2 IMPLANT
DRAIN JACKSON PRATT 10MM FLAT (MISCELLANEOUS) ×2 IMPLANT
DRAPE LAPAROTOMY 100X72 PEDS (DRAPES) ×2 IMPLANT
DRAPE MICROSCOPE LEICA (MISCELLANEOUS) IMPLANT
DRAPE POUCH INSTRU U-SHP 10X18 (DRAPES) ×2 IMPLANT
DRAPE SURG 17X23 STRL (DRAPES) ×6 IMPLANT
DRILL NEURO 2X3.1 SOFT TOUCH (MISCELLANEOUS) ×2
ELECT BLADE 4.0 EZ CLEAN MEGAD (MISCELLANEOUS) ×2
ELECT REM PT RETURN 9FT ADLT (ELECTROSURGICAL) ×2
ELECTRODE BLDE 4.0 EZ CLN MEGD (MISCELLANEOUS) ×1 IMPLANT
ELECTRODE REM PT RTRN 9FT ADLT (ELECTROSURGICAL) ×1 IMPLANT
EVACUATOR SILICONE 100CC (DRAIN) ×2 IMPLANT
GAUZE SPONGE 4X4 16PLY XRAY LF (GAUZE/BANDAGES/DRESSINGS) ×2 IMPLANT
GLOVE BIO SURGEON STRL SZ8 (GLOVE) ×2 IMPLANT
GLOVE BIO SURGEON STRL SZ8.5 (GLOVE) ×2 IMPLANT
GLOVE BIOGEL PI IND STRL 7.0 (GLOVE) ×1 IMPLANT
GLOVE BIOGEL PI IND STRL 7.5 (GLOVE) ×3 IMPLANT
GLOVE BIOGEL PI IND STRL 8.5 (GLOVE) ×1 IMPLANT
GLOVE BIOGEL PI INDICATOR 7.0 (GLOVE) ×1
GLOVE BIOGEL PI INDICATOR 7.5 (GLOVE) ×3
GLOVE BIOGEL PI INDICATOR 8.5 (GLOVE) ×1
GLOVE EXAM NITRILE LRG STRL (GLOVE) IMPLANT
GLOVE EXAM NITRILE MD LF STRL (GLOVE) IMPLANT
GLOVE EXAM NITRILE XL STR (GLOVE) IMPLANT
GLOVE EXAM NITRILE XS STR PU (GLOVE) IMPLANT
GLOVE INDICATOR 8.5 STRL (GLOVE) ×2 IMPLANT
GLOVE SS BIOGEL STRL SZ 8 (GLOVE) ×1 IMPLANT
GLOVE SUPERSENSE BIOGEL SZ 8 (GLOVE) ×1
GLOVE SURG SS PI 7.0 STRL IVOR (GLOVE) ×10 IMPLANT
GLOVE SURG SS PI 8.0 STRL IVOR (GLOVE) ×4 IMPLANT
GOWN BRE IMP SLV AUR LG STRL (GOWN DISPOSABLE) IMPLANT
GOWN BRE IMP SLV AUR XL STRL (GOWN DISPOSABLE) ×10 IMPLANT
GOWN STRL REIN 2XL LVL4 (GOWN DISPOSABLE) ×2 IMPLANT
KIT BASIN OR (CUSTOM PROCEDURE TRAY) ×2 IMPLANT
KIT ROOM TURNOVER OR (KITS) ×2 IMPLANT
MARKER SKIN DUAL TIP RULER LAB (MISCELLANEOUS) ×2 IMPLANT
NEEDLE HYPO 22GX1.5 SAFETY (NEEDLE) ×2 IMPLANT
NEEDLE SPNL 18GX3.5 QUINCKE PK (NEEDLE) ×2 IMPLANT
NS IRRIG 1000ML POUR BTL (IV SOLUTION) ×2 IMPLANT
PACK LAMINECTOMY NEURO (CUSTOM PROCEDURE TRAY) ×2 IMPLANT
PATTIES SURGICAL .5 X.5 (GAUZE/BANDAGES/DRESSINGS) ×2 IMPLANT
PEEK VISTA 14X14X8MM (Peek) ×2 IMPLANT
PIN DISTRACTION 14MM (PIN) ×4 IMPLANT
PLATE ELITE VISION 25MM (Plate) ×2 IMPLANT
PUTTY ABX ACTIFUSE 1.5ML (Putty) ×2 IMPLANT
RUBBERBAND STERILE (MISCELLANEOUS) IMPLANT
SCREW ST 13X4.5XST VAR NS (Screw) ×2 IMPLANT
SCREW ST 13X4XST VA NS SPNE (Screw) ×2 IMPLANT
SCREW ST VAR 4 ATL (Screw) ×2 IMPLANT
SCREW ST VAR 4.5 ATL (Screw) ×2 IMPLANT
SPONGE GAUZE 4X4 12PLY (GAUZE/BANDAGES/DRESSINGS) ×2 IMPLANT
SPONGE INTESTINAL PEANUT (DISPOSABLE) ×14 IMPLANT
SPONGE SURGIFOAM ABS GEL SZ50 (HEMOSTASIS) ×4 IMPLANT
STRIP CLOSURE SKIN 1/2X4 (GAUZE/BANDAGES/DRESSINGS) ×2 IMPLANT
SUT VIC AB 0 CT1 27 (SUTURE) ×1
SUT VIC AB 0 CT1 27XBRD ANTBC (SUTURE) ×1 IMPLANT
SUT VIC AB 3-0 SH 8-18 (SUTURE) ×2 IMPLANT
SYR 20ML ECCENTRIC (SYRINGE) ×2 IMPLANT
TAPE CLOTH SURG 4X10 WHT LF (GAUZE/BANDAGES/DRESSINGS) ×2 IMPLANT
TOWEL OR 17X24 6PK STRL BLUE (TOWEL DISPOSABLE) ×2 IMPLANT
TOWEL OR 17X26 10 PK STRL BLUE (TOWEL DISPOSABLE) ×2 IMPLANT
WATER STERILE IRR 1000ML POUR (IV SOLUTION) ×2 IMPLANT

## 2012-10-25 NOTE — Preoperative (Signed)
Beta Blockers   Reason not to administer Beta Blockers:Not Applicable 

## 2012-10-25 NOTE — Op Note (Signed)
Brief history: The patient is a 79 year old white male who I performed a C4-5, C5-6 and C6-7 anterior cervical discectomy, fusion, and plating on back in 2004. The patient has done well for years but has developed neck pain with radiation to his shoulders. He has failed medical management and was worked up with a cervical MRI. This demonstrated this degeneration and spondylosis/stenosis at C3-4. I discussed the various treatment options with the patient and his wife including surgery. The patient has weighed the risks, benefits, and alternatives surgery and decided proceed with a exploration of the cervical fusion, removal of his old cervical plate, and a C3-4 anterior cervical discectomy, fusion, and plating.  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 Earle Gell  Asst.: Dr. Dominica Severin cram  Anesthesia: Gen. endotracheal  Estimated blood loss: 100 cc  Drains: One prevertebral Hemovac drain  Complications: None  Description of procedure: The patient was brought to the operating room by the anesthesia team. General endotracheal anesthesia was induced. A roll was placed under the patient's shoulders to keep the neck in the neutral position. The patient's anterior cervical region was then prepared with Betadine scrub and Betadine solution. Sterile drapes were applied.  The area to be incised was then injected with Marcaine with epinephrine solution. I then used a scalpel to make a transverse incision in the  patient's left anterior neck. I used the Metzenbaum scissors to dissect through the scar tissue from the prior operation and to divide the platysmal muscle and then to dissect medial to the sternocleidomastoid muscle, jugular vein, and carotid artery. I carefully dissected down towards the anterior cervical spine, dissecting through the scar tissue, identifying the esophagus and retracting it medially. Then using Kitner swabs to clear soft tissue from the anterior cervical spine and to expose the old anterior cervical plate from C4 to C7. It was quite scarred down..  I then exposed and removed the locking screws. I then removed the screws and the plate from QA348G. I explored the arthrodesis. It appears solid.   I then used electrocautery to detach the medial border of the longus colli muscle bilaterally from the T3-4 intervertebral disc spaces. I then inserted the Caspar self-retaining retractor underneath the longus colli muscle bilaterally to provide exposure.  We then incised the intervertebral disc at C3-4, it was quite spondylotic. We then performed a partial intervertebral discectomy with a pituitary forceps and the Karlin curettes. I then inserted distraction screws into the vertebral bodies at C3 and C4. We then distracted the interspace. We then used the high-speed drill to decorticate the vertebral endplates at 075-GRM, to drill away the remainder of the intervertebral disc, to drill away some posterior spondylosis, and to thin out the posterior longitudinal ligament. I then incised ligament with the arachnoid knife. We then removed the ligament with a Kerrison punches undercutting the vertebral endplates and decompressing the thecal sac. We then performed foraminotomies about the bilateral C4 nerve roots. This completed the decompression at this level.   We now turned our to attention to the interbody fusion. We used the trial spacers to determine the appropriate size for the interbody prosthesis.  We then  pre-filled prosthesis with a combination of local morcellized autograft bone that we obtained during decompression as well as Actifuse bone graft extender. We then inserted the prosthesis into the distracted interspace at C3-4. We then removed the distraction screws. There was a good snug fit of the prosthesis in the interspace.   Having completed the fusion we now turned attention to the anterior spinal instrumentation. We used the high-speed drill to drill away some anterior spondylosis at the disc spaces so that the plate lay down flat. We selected the appropriate length titanium anterior cervical plate. We laid it along the anterior aspect of the vertebral bodies from C3-C4. We then drilled 14 mm mm holes at C3, we used the old holes at C4. We then secured the plate to the vertebral bodies by placing two 14 mm self-tapping screws at C3, we used rescue screws at the old C4 holes.3. We then obtained intraoperative radiograph. The demonstrating good position of the instrumentation. We therefore secured the screws the plate the locking each cam. This completed the instrumentation.  We then obtained hemostasis using bipolar electrocautery. We irrigated the wound out with bacitracin solution. We then removed the retractor. We inspected the esophagus for any damage. There was none apparent. We then placed a 10 mm flat Jackson-Pratt drain in the prevertebral space and tunneled out through separate stab wound. We then reapproximated patient's platysmal muscle with interrupted 3-0 Vicryl suture. We then reapproximated the subcutaneous tissue with interrupted 3-0 Vicryl suture. The skin was reapproximated with Steri-Strips and benzoin. The wound was then covered with bacitracin ointment. A sterile dressing was applied. The drapes were removed. Patient was subsequently extubated by the anesthesia team and transported to the post anesthesia care unit in stable condition. All sponge instrument and needle counts  were reportedly correct at the end of this case.

## 2012-10-25 NOTE — H&P (Signed)
Subjective: The patient is a 79 year old white male who I previously performed a C4-5, C5-6 and C6-7 anterior cervical discectomy, fusion and plating. He has developed recurrent neck and shoulder pain consistent with a cervical myelopathy/radiculopathy. I discussed the various treatment option with the patient and his wife including surgery. He has weighed the risks, benefits, and alternatives surgery and decided proceed with a exploration of his prior cervical fusion, removal of old cervical plate, and a C3-4 anterior cervical discectomy, fusion, and plating.   Past Medical History  Diagnosis Date  . Hypertension   . TIA (transient ischemic attack)   . GERD (gastroesophageal reflux disease)   . Arthritis   . 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 Confusion   . H/O dizziness     Past Surgical History  Procedure Laterality Date  . Hernia repair    . Back surgery    . 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 Smoking status: Former Smoker -- 1.00 packs/day for 30 years    Types: Cigarettes    Quit date: 03/12/1978  . Smokeless tobacco: Former Systems developer    Types: Chew    Quit date: 03/12/1978  . Alcohol Use: No    Family History  Problem Relation Age of Onset  . Sudden death Mother   . Other Father    Prior to Admission medications   Medication Sig Start Date End Date Taking? Authorizing Provider  amphetamine-dextroamphetamine (ADDERALL) 20 MG tablet Take 20 mg by mouth 2 (two) times daily.   Yes Historical Provider, MD  aspirin EC 81 MG tablet Take 81 mg by mouth daily.   Yes Historical Provider, MD  atenolol (TENORMIN) 100 MG tablet Take 100 mg by mouth daily.     Yes Historical Provider, MD  Cholecalciferol (VITAMIN D PO) Take 5,000 Units by mouth 2 (two) times daily.    Yes Historical Provider, MD  clopidogrel (PLAVIX) 75 MG tablet Take 75 mg by mouth  daily.   Yes Historical Provider, MD  doxazosin (CARDURA) 4 MG tablet Take 4 mg by mouth at bedtime.   Yes Historical Provider, MD  gemfibrozil (LOPID) 600 MG tablet Take 600 mg by mouth 2 (two) times daily before a meal.   Yes Historical Provider, MD  losartan (COZAAR) 100 MG tablet Take 100 mg by mouth at bedtime.    Yes Historical Provider, MD  minoxidil (LONITEN) 10 MG tablet Take 10 mg by mouth at bedtime.    Yes Historical Provider, MD  QUEtiapine (SEROQUEL) 25 MG tablet Take 25 mg by mouth at bedtime.   Yes Historical Provider, MD  testosterone cypionate (DEPOTESTOTERONE CYPIONATE) 100 MG/ML injection Inject 200 mg into the muscle every 14 (fourteen) days. For IM use only   Yes Historical Provider, MD  sennosides-docusate sodium (SENOKOT-S) 8.6-50 MG tablet Take 1 tablet by mouth at bedtime as needed for constipation.    Historical Provider, MD     Review of Systems  Positive ROS: As above  All other systems have been reviewed and were otherwise negative with the exception of those mentioned in the HPI and as above.  Objective: Vital signs in last 24 hours: Temp:  [97.5 F (36.4 C)] 97.5 F (36.4 C) (08/18 0849) Pulse Rate:  [62] 62 (08/18 0849) Resp:  [20] 20 (08/18 0849) BP: (132)/(70) 132/70 mmHg (08/18 0849) SpO2:  [  97 %] 97 % (08/18 0849)  General Appearance: Alert, cooperative, no distress, appears stated age Head: Normocephalic, without obvious abnormality, atraumatic Eyes: PERRL, conjunctiva/corneas clear, EOM's intact, fundi benign, both eyes      Ears: Normal TM's and external ear canals, both ears Throat: Lips, mucosa, and tongue normal; teeth and gums normal Neck: Supple, symmetrical, trachea midline, no adenopathy; thyroid: No enlargement/tenderness/nodules; no carotid bruit or JVD Back: Symmetric, no curvature, ROM normal, no CVA tenderness Lungs: Clear to auscultation bilaterally, respirations unlabored Heart: Regular rate and rhythm, S1 and S2 normal, no  murmur, rub or gallop Abdomen: Soft, non-tender, bowel sounds active all four quadrants, no masses, no organomegaly Extremities: Extremities normal, atraumatic, no cyanosis or edema Pulses: 2+ and symmetric all extremities Skin: Skin color, texture, turgor normal, no rashes or lesions  NEUROLOGIC:   Mental status: alert and oriented, no aphasia, good attention span, Fund of knowledge/ memory ok. The patient has a bit of a dysphasia Motor Exam - grossly normal Sensory Exam - grossly normal Reflexes:  Coordination - grossly normal Gait - grossly normal Balance - grossly normal Cranial Nerves: I: smell Not tested  II: visual acuity  OS: Normal    OD: Normal   II: visual fields Full to confrontation  II: pupils Equal, round, reactive to light  III,VII: ptosis None  III,IV,VI: extraocular muscles  Full ROM  V: mastication Normal  V: facial light touch sensation  Normal  V,VII: corneal reflex  Present  VII: facial muscle function - upper  Normal  VII: facial muscle function - lower Normal  VIII: hearing Not tested  IX: soft palate elevation  Normal  IX,X: gag reflex Present  XI: trapezius strength  5/5  XI: sternocleidomastoid strength 5/5  XI: neck flexion strength  5/5  XII: tongue strength  Normal    Data Review Lab Results  Component Value Date   WBC 5.0 10/15/2012   HGB 14.7 10/15/2012   HCT 42.2 10/15/2012   MCV 85.4 10/15/2012   PLT 168 10/15/2012   Lab Results  Component Value Date   NA 142 10/15/2012   K 3.7 10/15/2012   CL 104 10/15/2012   CO2 28 10/15/2012   BUN 22 10/15/2012   CREATININE 1.43* 10/15/2012   GLUCOSE 86 10/15/2012   Lab Results  Component Value Date   INR 1.07 05/14/2012    Assessment/Plan: 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 I reviewed the MR scan with them and pointed out the abnormalities. We have discussed the various treatment option including an  exploration of his prior fusion with removal all cervical plate as well as a 075-GRM anterior cervical discectomy, fusion, and plating. I described the surgery to them. I've shown him surgical models. We have discussed the risks, benefits, alternatives, and likelihood of achieving our goals with surgery. I have answered all the patient and his wife's questions. They want to proceed with surgery.   Lance Ramirez D 10/25/2012 10:06 AM

## 2012-10-25 NOTE — Anesthesia Preprocedure Evaluation (Addendum)
Anesthesia Evaluation  Patient identified by MRN, date of birth, ID band Patient awake    Reviewed: Allergy & Precautions, H&P , NPO status , Patient's Chart, lab work & pertinent test results  Airway Mallampati: II      Dental   Pulmonary neg pulmonary ROS,          Cardiovascular hypertension, + CABG and + Peripheral Vascular Disease Rhythm:Regular Rate:Normal     Neuro/Psych TIACVA    GI/Hepatic Neg liver ROS, GERD-  ,  Endo/Other  negative endocrine ROS  Renal/GU negative Renal ROS     Musculoskeletal   Abdominal   Peds  Hematology   Anesthesia Other Findings   Reproductive/Obstetrics                          Anesthesia Physical Anesthesia Plan  ASA: III  Anesthesia Plan: General   Post-op Pain Management:    Induction: Intravenous  Airway Management Planned: Oral ETT  Additional Equipment:   Intra-op Plan:   Post-operative Plan: Possible Post-op intubation/ventilation  Informed Consent: I have reviewed the patients History and Physical, chart, labs and discussed the procedure including the risks, benefits and alternatives for the proposed anesthesia with the patient or authorized representative who has indicated his/her understanding and acceptance.   Dental advisory given  Plan Discussed with: CRNA, Anesthesiologist and Surgeon  Anesthesia Plan Comments:        Anesthesia Quick Evaluation

## 2012-10-25 NOTE — Transfer of Care (Signed)
Immediate Anesthesia Transfer of Care Note  Patient: Lance Ramirez  Procedure(s) Performed: Procedure(s) with comments: ANTERIOR CERVICAL DECOMPRESSION/DISCECTOMY FUSION CERVICAL THREE-FOUR 1 LEVEL/HARDWARE REMOVAL (N/A) - Cervical three-four anterior cervical decompression with fusion interbody prothesis plating and bonegraft with removal of old Premier plate  Patient Location: PACU  Anesthesia Type:General  Level of Consciousness: awake and patient cooperative  Airway & Oxygen Therapy: Patient Spontanous Breathing and Patient connected to face mask oxygen  Post-op Assessment: Report given to PACU RN and Post -op Vital signs reviewed and stable  Post vital signs: Reviewed and stable  Complications: No apparent anesthesia complications

## 2012-10-25 NOTE — Progress Notes (Signed)
Patient ID: Lance Ramirez, male   DOB: Jul 27, 1933, 79 y.o.   MRN: [id number] Subjective:  The patient is mildly somnolent but easily arousable. He looks well. He is in no apparent distress.  Objective: Vital signs in last 24 hours: Temp:  [97.5 F (36.4 C)-98 F (36.7 C)] 98 F (36.7 C) (08/18 1347) Pulse Rate:  [62] 62 (08/18 0849) Resp:  [20] 20 (08/18 0849) BP: (132-135)/(60-70) 135/60 mmHg (08/18 1352) SpO2:  [97 %] 97 % (08/18 0849)  Intake/Output from previous day:   Intake/Output this shift: Total I/O In: 1700 [I.V.:1700] Out: 300 [Blood:300]  Physical exam the patient is Glasgow Coma Scale 14. He is moving all 4 extremities well. His dressing is clean and dry. There is no evidence of hematoma or shift.  Lab Results: No results found for this basename: WBC, HGB, HCT, PLT,  in the last 72 hours BMET No results found for this basename: NA, K, CL, CO2, GLUCOSE, BUN, CREATININE, CALCIUM,  in the last 72 hours  Studies/Results: No results found.  Assessment/Plan: The patient is doing well.  LOS: 0 days     Charnae Lill D 10/25/2012, 1:56 PM

## 2012-10-25 NOTE — Anesthesia Postprocedure Evaluation (Signed)
  Anesthesia Post-op Note  Patient: Lance Ramirez  Procedure(s) Performed: Procedure(s) with comments: ANTERIOR CERVICAL DECOMPRESSION/DISCECTOMY FUSION CERVICAL THREE-FOUR 1 LEVEL/HARDWARE REMOVAL (N/A) - Cervical three-four anterior cervical decompression with fusion interbody prothesis plating and bonegraft with removal of old Premier plate  Patient Location: PACU  Anesthesia Type:General  Level of Consciousness: awake  Airway and Oxygen Therapy: Patient Spontanous Breathing  Post-op Pain: mild  Post-op Assessment: Post-op Vital signs reviewed  Post-op Vital Signs: Reviewed  Complications: No apparent anesthesia complications

## 2012-10-26 MED ORDER — PANTOPRAZOLE SODIUM 40 MG PO TBEC
40.0000 mg | DELAYED_RELEASE_TABLET | Freq: Every day | ORAL | Status: DC
  Administered 2012-10-26: 40 mg via ORAL
  Filled 2012-10-26: qty 1

## 2012-10-26 NOTE — Progress Notes (Addendum)
Pt to TX to [id number], VSS, called report.

## 2012-10-26 NOTE — Progress Notes (Signed)
UR COMPLETED  

## 2012-10-26 NOTE — Progress Notes (Signed)
Patient ID: Lance Ramirez, male   DOB: November 25, 1933, 79 y.o.   MRN: [id number] Subjective:  The patient is alert and pleasant. He looks well. He is in no apparent distress.  Objective: Vital signs in last 24 hours: Temp:  [97.3 F (36.3 C)-98.4 F (36.9 C)] 98 F (36.7 C) (08/19 0420) Pulse Rate:  [60-89] 74 (08/19 0420) Resp:  [13-28] 14 (08/19 0420) BP: (91-141)/(36-71) 91/36 mmHg (08/19 0420) SpO2:  [89 %-100 %] 96 % (08/19 0420) Weight:  [87.2 kg (192 lb 3.9 oz)] 87.2 kg (192 lb 3.9 oz) (08/18 1625)  Intake/Output from previous day: 08/18 0701 - 08/19 0700 In: 2915.4 [P.O.:240; I.V.:2575.4; IV Piggyback:100] Out: 995 [Urine:635; Drains:60; Blood:300] Intake/Output this shift:    Physical exam the patient is alert and oriented. He is moving all 4 extremities well. His dressing is clean and dry without evidence of hematoma or shift. His drain has put out 60 cc.  Lab Results: No results found for this basename: WBC, HGB, HCT, PLT,  in the last 72 hours BMET No results found for this basename: NA, K, CL, CO2, GLUCOSE, BUN, CREATININE, CALCIUM,  in the last 72 hours  Studies/Results: Dg Cervical Spine 1 View  10/25/2012   *RADIOLOGY REPORT*  Clinical Data: ACDF.  DG CERVICAL SPINE - 1 VIEW  Comparison: MR cervical spine 09/04/2012.  Findings: Single intraoperative view of the cervical spine in the lateral projection is provided.  New plate and screws and interbody spacer are in place at C3-4.  Prior C4-7 fusion is better visualized on MRI.  IMPRESSION: C3-4 ACDF.  No acute finding.   Original Report Authenticated By: Orlean Patten, M.D.    Assessment/Plan: Postop day 1: I will transfer the patient to 4 N. We will plan to discontinue his drain tomorrow and likely send him home then.  LOS: 1 day     Tinnie Kunin D 10/26/2012, 7:36 AM

## 2012-10-27 ENCOUNTER — Encounter (HOSPITAL_COMMUNITY): Payer: Self-pay | Admitting: Neurosurgery

## 2012-10-27 MED ORDER — DSS 100 MG PO CAPS: 100.0000 mg | ORAL_CAPSULE | Freq: Two times a day (BID) | ORAL | Status: DC

## 2012-10-27 MED ORDER — OXYCODONE-ACETAMINOPHEN 5-325 MG PO TABS: 1.0000 | ORAL_TABLET | ORAL | Status: DC | PRN

## 2012-10-27 MED ORDER — DIAZEPAM 5 MG PO TABS: 5.0000 mg | ORAL_TABLET | Freq: Four times a day (QID) | ORAL | Status: DC | PRN

## 2012-10-27 NOTE — Discharge Summary (Signed)
Physician Discharge Summary  Patient ID: Lance Ramirez MRN: [id number] DOB/AGE: [birth year] 79 y.o.  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 *   Discharged Condition: good  Hospital Course: I performed an exploration of the patient's cervical fusion remote with removal of his old plate from [id number] and an anterior cervical discectomy fusion plating at C3-4 on 10/25/2012. The surgery went well.  The patient's postoperative course was unremarkable. On postop day #2 his drain was removed. He requested discharge to home. He was given oral and written discharge instructions. All his, and his wife's, questions were answered.  Consults:none Significant Diagnostic Studies:none Treatments:exploration of cervical fusion, removal of old cervical plate from [id number], 075-GRM anterior cervical discectomy, fusion, and plating. Discharge Exam: Blood pressure 123/56, pulse 78, temperature 97.4 F (36.3 C), temperature source Oral, resp. rate 18, height 6' (1.829 m), weight 87.2 kg (192 lb 3.9 oz), SpO2 99.00%. The patient is alert and pleasant. He looks well. His dressing is clean and dry. There is no evidence of hematoma or shift. I removed the drain. The patient's strength is normal.  Disposition: home  Discharge Orders   Future Orders Complete By Expires   Call MD for:  difficulty breathing, headache or visual disturbances  As directed    Call MD for:  extreme fatigue  As directed    Call MD for:  hives  As directed    Call MD for:  persistant dizziness or light-headedness  As directed    Call MD for:  persistant nausea and vomiting  As directed    Call MD for:  redness, tenderness, or signs of infection (pain, swelling, redness, odor or green/yellow discharge around incision site)  As directed    Call MD  for:  severe uncontrolled pain  As directed    Call MD for:  temperature >100.4  As directed    Diet - low sodium heart healthy  As directed    Discharge instructions  As directed    Comments:     Call [phone] for a followup appointment. Take a stool softener while you are using pain medications.   Driving Restrictions  As directed    Comments:     Do not drive for 2 weeks.   Increase activity slowly  As directed    Lifting restrictions  As directed    Comments:     Do not lift more than 5 pounds. No excessive bending or twisting.   May shower / Bathe  As directed    Comments:     He may shower after the pain she is removed 3 days after surgery. Leave the incision alone.   Remove dressing in 24 hours  As directed        Medication List         amphetamine-dextroamphetamine 20 MG tablet  Commonly known as:  ADDERALL  Take 20 mg by mouth 2 (two) times daily.     aspirin EC 81 MG tablet  Take 81 mg by mouth daily.     atenolol 100 MG tablet  Commonly known as:  TENORMIN  Take 100 mg by mouth daily.     clopidogrel 75 MG tablet  Commonly known as:  PLAVIX  Take 75 mg by mouth daily.     diazepam 5 MG tablet  Commonly known as:  VALIUM  Take 1 tablet (5 mg  total) by mouth every 6 (six) hours as needed.     doxazosin 4 MG tablet  Commonly known as:  CARDURA  Take 4 mg by mouth at bedtime.     DSS 100 MG Caps  Take 100 mg by mouth 2 (two) times daily.     gemfibrozil 600 MG tablet  Commonly known as:  LOPID  Take 600 mg by mouth 2 (two) times daily before a meal.     losartan 100 MG tablet  Commonly known as:  COZAAR  Take 100 mg by mouth at bedtime.     minoxidil 10 MG tablet  Commonly known as:  LONITEN  Take 10 mg by mouth at bedtime.     oxyCODONE-acetaminophen 5-325 MG per tablet  Commonly known as:  PERCOCET/ROXICET  Take 1-2 tablets by mouth every 4 (four) hours as needed.     QUEtiapine 25 MG tablet  Commonly known as:  SEROQUEL  Take 25 mg by  mouth at bedtime.     sennosides-docusate sodium 8.6-50 MG tablet  Commonly known as:  SENOKOT-S  Take 1 tablet by mouth at bedtime as needed for constipation.     testosterone cypionate 100 MG/ML injection  Commonly known as:  DEPOTESTOTERONE CYPIONATE  Inject 200 mg into the muscle every 14 (fourteen) days. For IM use only     VITAMIN D PO  Take 5,000 Units by mouth 2 (two) times daily.         SignedOphelia Charter 10/27/2012, 8:50 AM

## 2012-10-27 NOTE — Progress Notes (Signed)
Discharged instructions given. Patient verbalized understanding and all questions were answered.  

## 2012-10-27 NOTE — Plan of Care (Signed)
Problem: Consults Goal: Diagnosis - Spinal Surgery Outcome: Completed/Met Date Met:  10/27/12 Microdiscectomy

## 2013-01-20 ENCOUNTER — Telehealth: Payer: Self-pay | Admitting: Physician Assistant

## 2013-01-20 MED ORDER — AMPHETAMINE-DEXTROAMPHETAMINE 20 MG PO TABS: 20.0000 mg | ORAL_TABLET | Freq: Two times a day (BID) | ORAL | Status: DC

## 2013-01-20 NOTE — Telephone Encounter (Signed)
Pt wife requesting written Adderall refill.  Please call wife at [phone] when ready.  Sending chart back

## 2013-02-14 ENCOUNTER — Encounter: Payer: Self-pay | Admitting: Internal Medicine

## 2013-02-14 DIAGNOSIS — K219 Gastro-esophageal reflux disease without esophagitis: Secondary | ICD-10-CM | POA: Insufficient documentation

## 2013-02-14 DIAGNOSIS — E559 Vitamin D deficiency, unspecified: Secondary | ICD-10-CM | POA: Insufficient documentation

## 2013-02-15 ENCOUNTER — Ambulatory Visit (INDEPENDENT_AMBULATORY_CARE_PROVIDER_SITE_OTHER): Payer: Medicare Other | Admitting: Physician Assistant

## 2013-02-15 ENCOUNTER — Encounter: Payer: Self-pay | Admitting: Physician Assistant

## 2013-02-15 VITALS — BP 124/72 | HR 78 | Temp 97.6°F | Resp 18 | Wt 183.6 lb

## 2013-02-15 DIAGNOSIS — E785 Hyperlipidemia, unspecified: Secondary | ICD-10-CM

## 2013-02-15 DIAGNOSIS — I10 Essential (primary) hypertension: Secondary | ICD-10-CM

## 2013-02-15 DIAGNOSIS — M7551 Bursitis of right shoulder: Secondary | ICD-10-CM

## 2013-02-15 DIAGNOSIS — E782 Mixed hyperlipidemia: Secondary | ICD-10-CM

## 2013-02-15 DIAGNOSIS — R7303 Prediabetes: Secondary | ICD-10-CM

## 2013-02-15 DIAGNOSIS — E559 Vitamin D deficiency, unspecified: Secondary | ICD-10-CM

## 2013-02-15 DIAGNOSIS — R7309 Other abnormal glucose: Secondary | ICD-10-CM

## 2013-02-15 LAB — BASIC METABOLIC PANEL WITH GFR
CO2: 28 mEq/L (ref 19–32)
Chloride: 102 mEq/L (ref 96–112)
Creat: 1.15 mg/dL (ref 0.50–1.35)
GFR, Est Non African American: 61 mL/min
Potassium: 4.8 mEq/L (ref 3.5–5.3)

## 2013-02-15 LAB — LIPID PANEL
HDL: 60 mg/dL (ref >39)
LDL Cholesterol: 143 mg/dL — ABNORMAL HIGH (ref 0–99)
Total CHOL/HDL Ratio: 3.6 Ratio
VLDL: 10 mg/dL (ref 0–40)

## 2013-02-15 LAB — CBC WITH DIFFERENTIAL/PLATELET
Eosinophils Relative: 1 % (ref 0–5)
HCT: 44.1 % (ref 39.0–52.0)
Lymphocytes Relative: 25 % (ref 12–46)
Lymphs Abs: 1.2 10*3/uL (ref 0.7–4.0)
MCV: 83.2 fL (ref 78.0–100.0)
Monocytes Absolute: 0.4 10*3/uL (ref 0.1–1.0)
Neutro Abs: 3.3 10*3/uL (ref 1.7–7.7)
Platelets: 206 10*3/uL (ref 150–400)
RBC: 5.3 MIL/uL (ref 4.22–5.81)
WBC: 4.9 10*3/uL (ref 4.0–10.5)

## 2013-02-15 LAB — HEPATIC FUNCTION PANEL
AST: 15 U/L (ref 0–37)
Albumin: 4.3 g/dL (ref 3.5–5.2)
Alkaline Phosphatase: 59 U/L (ref 39–117)
Indirect Bilirubin: 0.6 mg/dL (ref 0.0–0.9)
Total Bilirubin: 0.7 mg/dL (ref 0.3–1.2)

## 2013-02-15 MED ORDER — HYDROCODONE-ACETAMINOPHEN 5-325 MG PO TABS: 1.0000 | ORAL_TABLET | Freq: Four times a day (QID) | ORAL | Status: DC | PRN

## 2013-02-15 MED ORDER — DEXAMETHASONE SODIUM PHOSPHATE 100 MG/10ML IJ SOLN
10.0000 mg | Freq: Once | INTRAMUSCULAR | Status: AC
  Administered 2013-02-15: 10 mg via INTRAMUSCULAR

## 2013-02-15 MED ORDER — OMEPRAZOLE 40 MG PO CPDR: 40.0000 mg | DELAYED_RELEASE_CAPSULE | Freq: Every day | ORAL | Status: DC

## 2013-02-15 NOTE — Progress Notes (Signed)
HPI Patient presents for 3 month follow up with hypertension, hyperlipidemia, prediabetes and vitamin D. Patient's blood pressure has been controlled at home. Patient denies chest pain, shortness of breath, dizziness.  Patient's cholesterol is diet controlled. In addition they are on lopid  and denies myalgias. The cholesterol last visit was LDL was 135.  The patient has been working on diet and exercise for prediabetes, and denies changes in vision, polys, and paresthesias. A1C 5.9 Patient has been having bad breath and trouble swallowing breads for a long time, denies nausea.  He complains of right shoulder pain- 2 weeks ago he had a shot at the ortho but states it continues to hurt.  Patient is on Vitamin D supplement.  Current Medications:  Current Outpatient Prescriptions on File Prior to Visit  Medication Sig Dispense Refill  . amphetamine-dextroamphetamine (ADDERALL) 20 MG tablet Take 1 tablet (20 mg total) by mouth 2 (two) times daily.  60 tablet  0  . aspirin EC 81 MG tablet Take 81 mg by mouth daily.      Marland Kitchen atenolol (TENORMIN) 100 MG tablet Take 100 mg by mouth daily.        . Cholecalciferol (VITAMIN D PO) Take 5,000 Units by mouth 2 (two) times daily.       . clopidogrel (PLAVIX) 75 MG tablet Take 75 mg by mouth daily.      Marland Kitchen doxazosin (CARDURA) 4 MG tablet Take 4 mg by mouth at bedtime.      Marland Kitchen gemfibrozil (LOPID) 600 MG tablet Take 600 mg by mouth 2 (two) times daily before a meal.      . losartan (COZAAR) 100 MG tablet Take 100 mg by mouth at bedtime.       . minoxidil (LONITEN) 10 MG tablet Take 10 mg by mouth at bedtime.       Marland Kitchen QUEtiapine (SEROQUEL) 25 MG tablet Take 25 mg by mouth at bedtime.      Marland Kitchen testosterone cypionate (DEPOTESTOTERONE CYPIONATE) 100 MG/ML injection Inject 200 mg into the muscle every 14 (fourteen) days. For IM use only      . diazepam (VALIUM) 5 MG tablet Take 1 tablet (5 mg total) by mouth every 6 (six) hours as needed.  50 tablet  1  . docusate sodium  100 MG CAPS Take 100 mg by mouth 2 (two) times daily.  60 capsule  0  . oxyCODONE-acetaminophen (PERCOCET/ROXICET) 5-325 MG per tablet Take 1-2 tablets by mouth every 4 (four) hours as needed.  100 tablet  0  . sennosides-docusate sodium (SENOKOT-S) 8.6-50 MG tablet Take 1 tablet by mouth at bedtime as needed for constipation.       No current facility-administered medications on file prior to visit.   Medical History:  Past Medical History  Diagnosis Date  . TIA (transient ischemic attack)   . Confusion   . H/O dizziness   . Arthritis   . GERD (gastroesophageal reflux disease)   . Hypertension   . Hyperlipidemia   . 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 Vitamin D deficiency   . Other testicular hypofunction    Allergies: No Known Allergies  ROS Constitutional: Denies fever, chills, weight loss/gain, headaches, insomnia, fatigue, night sweats, and change in appetite. Eyes: Denies redness, blurred vision, diplopia, discharge, itchy, watery eyes.  ENT: Denies discharge, congestion, post nasal drip, sore throat, earache, dental pain, Tinnitus, Vertigo, Sinus pain, snoring.  Cardio: Denies chest pain, palpitations, irregular  heartbeat,  dyspnea, diaphoresis, orthopnea, PND, claudication, edema Respiratory: denies cough, dyspnea,pleurisy, hoarseness, wheezing.  Gastrointestinal: +dysphagia, heartburn Denies water brash, pain, cramps, nausea, vomiting, bloating, diarrhea, constipation, hematemesis, melena, hematochezia,  hemorrhoids Genitourinary: Denies dysuria, frequency, urgency, nocturia, hesitancy, discharge, hematuria, flank pain Musculoskeletal: + right shoulder pain Denies arthralgia, myalgia, stiffness, Jt. Swelling, pain, limp, and strain/sprain. Skin: + rash on right arm Denies pruritis, hives, warts, acne, eczema, changing in skin lesion Neuro: Denies Weakness, tremor, incoordination, spasms, paresthesia, pain Psychiatric:  Denies confusion, memory loss, sensory loss Endocrine: Denies change in weight, skin, hair change, nocturia, and paresthesia, Diabetic Polys, Denies visual blurring, hyper /hypo glycemic episodes.  Heme/Lymph: Denies Excessive bleeding, bruising, enlarged lymph nodes  Family history- Review and unchanged Social history- Review and unchanged Physical Exam: Filed Vitals:   02/15/13 1350  BP: 124/72  Pulse: 78  Temp: 97.6 F (36.4 C)  Resp: 18   Filed Weights   02/15/13 1350  Weight: 183 lb 9.6 oz (83.28 kg)   General Appearance: Well nourished, in no apparent distress. Eyes: PERRLA, EOMs, conjunctiva no swelling or erythema, normal fundi and vessels. Sinuses: No Frontal/maxillary tenderness ENT/Mouth: Ext aud canals clear, with TMs without erythema, bulging.No erythema, swelling, or exudate on post pharynx.  Tonsils not swollen or erythematous. Hearing normal.  Neck: Supple, thyroid normal.  Respiratory: Respiratory effort normal, BS equal bilaterally without rales, rhonchi, wheezing or stridor.  Cardio: Heart sounds normal, regular rate and rhythm without murmurs, rubs or gallops. Peripheral pulses brisk and equal bilaterally, without edema.  Abdomen: Flat, soft, with bowel sounds. Non tender, no guarding, rebound, hernias, masses, or organomegaly.  Lymphatics: Non tender without lymphadenopathy.  Musculoskeletal: Full ROM all peripheral extremities, patient is able to abduct right shoulder to 180 + subacromial bursa pain Skin: Warm, dry without rashes, lesions, ecchymosis.  Neuro: Cranial nerves intact, reflexes equal bilaterally. Normal muscle tone, no cerebellar symptoms. Sensation intact.  Psych: Awake and oriented X 3, normal affect, Insight and Judgment appropriate.   Assessment and Plan:  Hypertension: Continue medication, monitor blood pressure at home. Continue DASH diet. Cholesterol: Continue diet and exercise. Check cholesterol.  Pre-diabetes-Continue diet and exercise.  Check A1C Vitamin D Def- check level and continue medications.  Right shoulder pain at subacromial bursa- Verbal consent was given for a dexamethasone injection. Risks and benefits were discussed with the patient. Area was cleaned with alcohol. 1cc lidocaine and 1 cc Dexamethasone 10 mg was injected IM. Patient tolerated well with minimal bleeding. Patient told to call the office for any signs of infection such as erythema, warmth, swelling, discharge.- If not better needs Xray and referral to ortho Dysphagia- continue prilosec sent in, if gets worse or not better need referral to GI for EGD   Vicie Mutters 2:09 PM

## 2013-02-15 NOTE — Patient Instructions (Signed)
   Bad carbs also include fruit juice, alcohol, and sweet tea. These are empty calories that do not signal to your brain that you are full.   Please remember the good carbs are still carbs which convert into sugar. So please measure them out no more than 1/2-1 cup of rice, oatmeal, pasta, and beans.  Veggies are however free foods! Pile them on.   I like lean protein at every meal such as chicken, [place], pork chops, cottage cheese, etc. Just do not fry these meats and please center your meal around vegetable, the meats should be a side dish.   No all fruit is created equal. Please see the list below, the fruit at the bottom is higher in sugars than the fruit at the top   Remember exercise is great for your cardiovascular health and can help with weight loss but YOU CAN NOT OUT RUN YOUR FORK!

## 2013-02-16 ENCOUNTER — Other Ambulatory Visit: Payer: Self-pay | Admitting: Emergency Medicine

## 2013-02-16 ENCOUNTER — Other Ambulatory Visit: Payer: Self-pay | Admitting: Internal Medicine

## 2013-02-16 LAB — VITAMIN D 25 HYDROXY (VIT D DEFICIENCY, FRACTURES): Vit D, 25-Hydroxy: 71 ng/mL (ref 30–89)

## 2013-02-16 LAB — INSULIN, FASTING: Insulin fasting, serum: 6 u[IU]/mL (ref 3–28)

## 2013-02-16 LAB — TSH: TSH: 1.917 u[IU]/mL (ref 0.350–4.500)

## 2013-02-16 MED ORDER — AMPHETAMINE-DEXTROAMPHETAMINE 20 MG PO TABS: 20.0000 mg | ORAL_TABLET | Freq: Two times a day (BID) | ORAL | Status: DC

## 2013-03-01 ENCOUNTER — Other Ambulatory Visit: Payer: Self-pay | Admitting: Internal Medicine

## 2013-03-01 MED ORDER — SILDENAFIL CITRATE 100 MG PO TABS: 100.0000 mg | ORAL_TABLET | ORAL | Status: DC | PRN

## 2013-03-21 ENCOUNTER — Other Ambulatory Visit: Payer: Self-pay | Admitting: Emergency Medicine

## 2013-03-21 MED ORDER — AMPHETAMINE-DEXTROAMPHETAMINE 20 MG PO TABS: 20.0000 mg | ORAL_TABLET | Freq: Two times a day (BID) | ORAL | Status: DC

## 2013-04-07 ENCOUNTER — Ambulatory Visit: Payer: Self-pay | Admitting: Internal Medicine

## 2013-04-08 ENCOUNTER — Encounter (HOSPITAL_COMMUNITY): Payer: Self-pay | Admitting: Emergency Medicine

## 2013-04-08 ENCOUNTER — Inpatient Hospital Stay (HOSPITAL_COMMUNITY)
Admission: EM | Admit: 2013-04-08 | Discharge: 2013-04-11 | DRG: 202 | Disposition: A | Discharge status: Home / Self Care | Payer: Medicare Other | Attending: Internal Medicine | Admitting: Internal Medicine

## 2013-04-08 ENCOUNTER — Emergency Department (HOSPITAL_COMMUNITY): Payer: Medicare Other

## 2013-04-08 DIAGNOSIS — K219 Gastro-esophageal reflux disease without esophagitis: Secondary | ICD-10-CM | POA: Diagnosis present

## 2013-04-08 DIAGNOSIS — I69998 Other sequelae following unspecified cerebrovascular disease: Secondary | ICD-10-CM

## 2013-04-08 DIAGNOSIS — E785 Hyperlipidemia, unspecified: Secondary | ICD-10-CM

## 2013-04-08 DIAGNOSIS — J9801 Acute bronchospasm: Secondary | ICD-10-CM

## 2013-04-08 DIAGNOSIS — J209 Acute bronchitis, unspecified: Secondary | ICD-10-CM

## 2013-04-08 DIAGNOSIS — N289 Disorder of kidney and ureter, unspecified: Secondary | ICD-10-CM

## 2013-04-08 DIAGNOSIS — R062 Wheezing: Secondary | ICD-10-CM | POA: Diagnosis present

## 2013-04-08 DIAGNOSIS — R0602 Shortness of breath: Secondary | ICD-10-CM

## 2013-04-08 DIAGNOSIS — R413 Other amnesia: Secondary | ICD-10-CM | POA: Diagnosis present

## 2013-04-08 DIAGNOSIS — Z7982 Long term (current) use of aspirin: Secondary | ICD-10-CM

## 2013-04-08 DIAGNOSIS — I10 Essential (primary) hypertension: Secondary | ICD-10-CM

## 2013-04-08 DIAGNOSIS — N179 Acute kidney failure, unspecified: Secondary | ICD-10-CM

## 2013-04-08 DIAGNOSIS — Z8673 Personal history of transient ischemic attack (TIA), and cerebral infarction without residual deficits: Secondary | ICD-10-CM | POA: Diagnosis present

## 2013-04-08 DIAGNOSIS — Z87891 Personal history of nicotine dependence: Secondary | ICD-10-CM

## 2013-04-08 DIAGNOSIS — I639 Cerebral infarction, unspecified: Secondary | ICD-10-CM

## 2013-04-08 DIAGNOSIS — R509 Fever, unspecified: Secondary | ICD-10-CM | POA: Diagnosis present

## 2013-04-08 DIAGNOSIS — E782 Mixed hyperlipidemia: Secondary | ICD-10-CM | POA: Diagnosis present

## 2013-04-08 DIAGNOSIS — E860 Dehydration: Secondary | ICD-10-CM

## 2013-04-08 DIAGNOSIS — J069 Acute upper respiratory infection, unspecified: Secondary | ICD-10-CM

## 2013-04-08 LAB — CBC WITH DIFFERENTIAL/PLATELET
BASOS ABS: 0 10*3/uL (ref 0.0–0.1)
Basophils Relative: 0 % (ref 0–1)
Eosinophils Absolute: 0.1 10*3/uL (ref 0.0–0.7)
Eosinophils Relative: 1 % (ref 0–5)
HCT: 38.2 % — ABNORMAL LOW (ref 39.0–52.0)
HEMOGLOBIN: 13.1 g/dL (ref 13.0–17.0)
Lymphocytes Relative: 12 % (ref 12–46)
Lymphs Abs: 1.1 10*3/uL (ref 0.7–4.0)
MCH: 29 pg (ref 26.0–34.0)
MCHC: 34.3 g/dL (ref 30.0–36.0)
MCV: 84.7 fL (ref 78.0–100.0)
Monocytes Absolute: 1 10*3/uL (ref 0.1–1.0)
Monocytes Relative: 11 % (ref 3–12)
NEUTROS ABS: 6.8 10*3/uL (ref 1.7–7.7)
NEUTROS PCT: 75 % (ref 43–77)
PLATELETS: 138 10*3/uL — AB (ref 150–400)
RBC: 4.51 MIL/uL (ref 4.22–5.81)
RDW: 13.9 % (ref 11.5–15.5)
WBC: 9 10*3/uL (ref 4.0–10.5)

## 2013-04-08 LAB — BASIC METABOLIC PANEL
BUN: 23 mg/dL (ref 6–23)
CHLORIDE: 103 meq/L (ref 96–112)
CO2: 22 mEq/L (ref 19–32)
Calcium: 9.2 mg/dL (ref 8.4–10.5)
Creatinine, Ser: 1.95 mg/dL — ABNORMAL HIGH (ref 0.50–1.35)
GFR calc non Af Amer: 31 mL/min — ABNORMAL LOW (ref >90)
GFR, EST AFRICAN AMERICAN: 36 mL/min — AB (ref >90)
Glucose, Bld: 123 mg/dL — ABNORMAL HIGH (ref 70–99)
Potassium: 4 mEq/L (ref 3.7–5.3)
SODIUM: 137 meq/L (ref 137–147)

## 2013-04-08 MED ORDER — IPRATROPIUM BROMIDE 0.02 % IN SOLN
0.5000 mg | Freq: Once | RESPIRATORY_TRACT | Status: AC
  Administered 2013-04-08: 0.5 mg via RESPIRATORY_TRACT
  Filled 2013-04-08: qty 2.5

## 2013-04-08 MED ORDER — ALBUTEROL SULFATE (2.5 MG/3ML) 0.083% IN NEBU
5.0000 mg | INHALATION_SOLUTION | Freq: Once | RESPIRATORY_TRACT | Status: AC
  Administered 2013-04-08: 5 mg via RESPIRATORY_TRACT
  Filled 2013-04-08: qty 6

## 2013-04-08 MED ORDER — IPRATROPIUM BROMIDE 0.02 % IN SOLN
0.5000 mg | Freq: Once | RESPIRATORY_TRACT | Status: AC
Start: 2013-04-08 — End: 2013-04-08
  Administered 2013-04-08: 0.5 mg via RESPIRATORY_TRACT
  Filled 2013-04-08: qty 2.5

## 2013-04-08 MED ORDER — SODIUM CHLORIDE 0.9 % IV BOLUS (SEPSIS)
500.0000 mL | Freq: Once | INTRAVENOUS | Status: AC
  Administered 2013-04-09: 500 mL via INTRAVENOUS

## 2013-04-08 MED ORDER — DM-GUAIFENESIN ER 30-600 MG PO TB12
1.0000 | ORAL_TABLET | Freq: Two times a day (BID) | ORAL | Status: DC
  Administered 2013-04-08 – 2013-04-11 (×6): 1 via ORAL
  Filled 2013-04-08 (×7): qty 1

## 2013-04-08 MED ORDER — SODIUM CHLORIDE 0.9 % IV SOLN: INTRAVENOUS | Status: DC

## 2013-04-08 MED ORDER — IPRATROPIUM-ALBUTEROL 0.5-2.5 (3) MG/3ML IN SOLN
3.0000 mL | Freq: Four times a day (QID) | RESPIRATORY_TRACT | Status: DC
  Administered 2013-04-09: 3 mL via RESPIRATORY_TRACT
  Filled 2013-04-08: qty 3

## 2013-04-08 NOTE — ED Notes (Signed)
Pt arrived to the Ed with a complaint of a fever and shortness of breath.  Pt states his symptoms have been going on for a couple of days.  Tylenol and ibuprofen have not helped his symptoms

## 2013-04-08 NOTE — ED Provider Notes (Signed)
CSN: [id number]     Arrival date & time 04/08/13  2006 History   First MD Initiated Contact with Patient 04/08/13 2014     Chief Complaint  Patient presents with  . Fever  . Shortness of Breath   (Consider location/radiation/quality/duration/timing/severity/associated sxs/prior Treatment) HPI Patient presents with his wife and daughter. He reports 4 days ago he started "feeling bad". The following day he started getting a sore throat, coughing, that is mainly dry with rare white mucus production, fever to 102 last night without chills. Patient states he feels hot all the time. He has had decreased appetite and is drinking less. He denies nausea, vomiting or diarrhea. He states his chest is sore from coughing but denies chest pain. He states he feels short of breath and has dyspnea on exertion. He states he has been making rattling noises when he breathes. Wife reports he hasn't been eating or drinking well since he had his last stroke last year. His granddaughter was sick with respiratory symptoms last week then his wife got sick and now the patient is sick.   PCP Dr Melford Aase  Past Medical History  Diagnosis Date  . TIA (transient ischemic attack)   . Confusion   . H/O dizziness   . Arthritis   . GERD (gastroesophageal reflux disease)   . Hypertension   . Hyperlipidemia   . 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 Vitamin D deficiency   . Other testicular hypofunction    Past Surgical History  Procedure Laterality Date  . Hernia repair    . Back surgery    . 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 Anterior cervical decomp/discectomy fusion N/A 10/25/2012    Procedure: ANTERIOR CERVICAL DECOMPRESSION/DISCECTOMY FUSION CERVICAL THREE-FOUR 1 LEVEL/HARDWARE REMOVAL;  Surgeon: Ophelia Charter, MD;  Location: [facility];  Service: Neurosurgery;  Laterality: N/A;  Cervical three-four anterior cervical decompression  with fusion interbody prothesis plating and bonegraft with removal of old Premier plate   Family History  Problem Relation Age of Onset  . Sudden death Mother   . Other Father    History  Substance Use Topics  . Smoking status: Former Smoker -- 1.00 packs/day for 30 years    Types: Cigarettes    Quit date: 03/12/1978  . Smokeless tobacco: Former Systems developer    Types: Chew    Quit date: 03/12/1978  . Alcohol Use: No  lives at home Lives with spouse  Review of Systems  All other systems reviewed and are negative.    Allergies  Review of patient's allergies indicates no known allergies.  Home Medications   Current Outpatient Rx  Name  Route  Sig  Dispense  Refill  . amphetamine-dextroamphetamine (ADDERALL) 20 MG tablet   Oral   Take 1 tablet (20 mg total) by mouth 2 (two) times daily.   60 tablet   0   . aspirin EC 81 MG tablet   Oral   Take 81 mg by mouth daily.         Marland Kitchen atenolol (TENORMIN) 100 MG tablet   Oral   Take 100 mg by mouth daily.           . Cholecalciferol (VITAMIN D-3) 5000 UNITS TABS   Oral   Take 1 tablet by mouth 2 (two) times daily.         . clopidogrel (PLAVIX) 75 MG tablet  Oral   Take 75 mg by mouth daily.         . diphenhydramine-acetaminophen (TYLENOL PM) 25-500 MG TABS   Oral   Take 1 tablet by mouth at bedtime as needed.         . doxazosin (CARDURA) 4 MG tablet   Oral   Take 4 mg by mouth daily.         Marland Kitchen gemfibrozil (LOPID) 600 MG tablet   Oral   Take 600 mg by mouth 2 (two) times daily before a meal.         . losartan (COZAAR) 100 MG tablet   Oral   Take 100 mg by mouth at bedtime.          . Magnesium 300 MG CAPS   Oral   Take 1 capsule by mouth daily.         . minoxidil (LONITEN) 10 MG tablet   Oral   Take 10 mg by mouth at bedtime.          . Omega-3 Fatty Acids (FISH OIL) 1000 MG CPDR   Oral   Take 1 capsule by mouth daily.         . QUEtiapine (SEROQUEL) 50 MG tablet   Oral   Take 50  mg by mouth at bedtime.         . sildenafil (VIAGRA) 100 MG tablet   Oral   Take 1 tablet (100 mg total) by mouth as needed for erectile dysfunction.   10 tablet   11   . testosterone cypionate (DEPOTESTOTERONE CYPIONATE) 100 MG/ML injection   Intramuscular   Inject 200 mg into the muscle every 14 (fourteen) days. For IM use only          BP 131/68  Pulse 101  Temp(Src) 98.7 F (37.1 C) (Oral)  Resp 18  SpO2 95%  Vital signs normal except tachycardia  Physical Exam  Nursing note and vitals reviewed. Constitutional: He is oriented to person, place, and time. He appears well-developed and well-nourished.  Non-toxic appearance. He does not appear ill. No distress.  HENT:  Head: Normocephalic and atraumatic.  Right Ear: External ear normal.  Left Ear: External ear normal.  Nose: Nose normal. No mucosal edema or rhinorrhea.  Mouth/Throat: Oropharynx is clear and moist and mucous membranes are normal. No dental abscesses or uvula swelling.  Eyes: Conjunctivae and EOM are normal. Pupils are equal, round, and reactive to light.  Neck: Normal range of motion and full passive range of motion without pain. Neck supple.  Cardiovascular: Normal rate, regular rhythm and normal heart sounds.  Exam reveals no gallop and no friction rub.   No murmur heard. Pulmonary/Chest: Effort normal and breath sounds normal. No respiratory distress. He has no wheezes. He has no rhonchi. He has no rales. He exhibits no tenderness and no crepitus.  Coughing frequently, patient has diminished breath sounds with diffuse expiratory wheezing/rhonchi, patient starts to cough when he takes a big deep breath  Abdominal: Soft. Normal appearance and bowel sounds are normal. He exhibits no distension. There is no tenderness. There is no rebound and no guarding.  Musculoskeletal: Normal range of motion. He exhibits no edema and no tenderness.  Moves all extremities well.   Neurological: He is alert and oriented to  person, place, and time. He has normal strength. No cranial nerve deficit.  Skin: Skin is warm, dry and intact. No rash noted. No erythema. No pallor.  Psychiatric: He has a normal  mood and affect. His speech is normal and behavior is normal. His mood appears not anxious.    ED Course  Procedures (including critical care time)  Medications  dextromethorphan-guaiFENesin (MUCINEX DM) 30-600 MG per 12 hr tablet 1 tablet (1 tablet Oral Given 04/08/13 2116)  sodium chloride 0.9 % bolus 500 mL (not administered)  0.9 %  sodium chloride infusion (not administered)  albuterol (PROVENTIL) (2.5 MG/3ML) 0.083% nebulizer solution 5 mg (5 mg Nebulization Given 04/08/13 2049)  ipratropium (ATROVENT) nebulizer solution 0.5 mg (0.5 mg Nebulization Given 04/08/13 2049)  ipratropium (ATROVENT) nebulizer solution 0.5 mg (0.5 mg Nebulization Given 04/08/13 2241)  albuterol (PROVENTIL) (2.5 MG/3ML) 0.083% nebulizer solution 5 mg (5 mg Nebulization Given 04/08/13 2241)    22:25 Recheck after his first nebulizer, feeling better, less cough, still feels like he is rattling. Pt has improved air movement, still has end expiratory wheezing/rhonchi  23:18 Dr Gasper Lloyd admit to observation, med-surg bed, team 8  23:40 recheck after second nebulizer, he had a lot or rhonchi, then coughed and his lungs were almost clear.  Discussed his admissino.   Labs Review Results for orders placed during the hospital encounter of 04/08/13  CBC WITH DIFFERENTIAL      Result Value Range   WBC 9.0  4.0 - 10.5 K/uL   RBC 4.51  4.22 - 5.81 MIL/uL   Hemoglobin 13.1  13.0 - 17.0 g/dL   HCT 38.2 (*) 39.0 - 52.0 %   MCV 84.7  78.0 - 100.0 fL   MCH 29.0  26.0 - 34.0 pg   MCHC 34.3  30.0 - 36.0 g/dL   RDW 13.9  11.5 - 15.5 %   Platelets 138 (*) 150 - 400 K/uL   Neutrophils Relative % 75  43 - 77 %   Neutro Abs 6.8  1.7 - 7.7 K/uL   Lymphocytes Relative 12  12 - 46 %   Lymphs Abs 1.1  0.7 - 4.0 K/uL   Monocytes Relative 11  3 - 12 %    Monocytes Absolute 1.0  0.1 - 1.0 K/uL   Eosinophils Relative 1  0 - 5 %   Eosinophils Absolute 0.1  0.0 - 0.7 K/uL   Basophils Relative 0  0 - 1 %   Basophils Absolute 0.0  0.0 - 0.1 K/uL  BASIC METABOLIC PANEL      Result Value Range   Sodium 137  137 - 147 mEq/L   Potassium 4.0  3.7 - 5.3 mEq/L   Chloride 103  96 - 112 mEq/L   CO2 22  19 - 32 mEq/L   Glucose, Bld 123 (*) 70 - 99 mg/dL   BUN 23  6 - 23 mg/dL   Creatinine, Ser 1.95 (*) 0.50 - 1.35 mg/dL   Calcium 9.2  8.4 - 10.5 mg/dL   GFR calc non Af Amer 31 (*) >90 mL/min   GFR calc Af Amer 36 (*) >90 mL/min   Vital signs normal new acute renal insufficiency since February 15, 2013    Imaging Review Dg Chest 2 View  04/08/2013   CLINICAL DATA:  Shortness of breath, dizzy, nonsmoker  EXAM: CHEST  2 VIEW  COMPARISON:  05/15/2012  FINDINGS: Heart size upper normal. Aorta on coiled. Vascular pattern normal. Lungs clear.  IMPRESSION: No change from prior study.  No acute findings.   Electronically Signed   By: Skipper Cliche M.D.   On: 04/08/2013 21:41    EKG Interpretation   None  MDM  patient presents with fever and respiratory symptoms that have been getting progressively worse over the past couple days with dehydration and acute renal insufficiency. He also has rhonchorous spasm with his respiratory symptoms that is responding to albuterol nebulizers. Patient is being admitted overnight for slow hydration.    1. URI (upper respiratory infection)   2. Acute renal insufficiency   3. Dehydration   4. Bronchospasm      Plan admission  Rolland Porter, MD, Alanson Aly, MD 04/08/13 2352

## 2013-04-09 DIAGNOSIS — N179 Acute kidney failure, unspecified: Secondary | ICD-10-CM

## 2013-04-09 DIAGNOSIS — I6350 Cerebral infarction due to unspecified occlusion or stenosis of unspecified cerebral artery: Secondary | ICD-10-CM

## 2013-04-09 DIAGNOSIS — R0602 Shortness of breath: Secondary | ICD-10-CM

## 2013-04-09 DIAGNOSIS — J209 Acute bronchitis, unspecified: Principal | ICD-10-CM

## 2013-04-09 DIAGNOSIS — E785 Hyperlipidemia, unspecified: Secondary | ICD-10-CM

## 2013-04-09 DIAGNOSIS — J069 Acute upper respiratory infection, unspecified: Secondary | ICD-10-CM

## 2013-04-09 DIAGNOSIS — I10 Essential (primary) hypertension: Secondary | ICD-10-CM

## 2013-04-09 DIAGNOSIS — N289 Disorder of kidney and ureter, unspecified: Secondary | ICD-10-CM

## 2013-04-09 LAB — CBC
HEMATOCRIT: 36.7 % — AB (ref 39.0–52.0)
Hemoglobin: 12.2 g/dL — ABNORMAL LOW (ref 13.0–17.0)
MCH: 28.3 pg (ref 26.0–34.0)
MCHC: 33.2 g/dL (ref 30.0–36.0)
MCV: 85.2 fL (ref 78.0–100.0)
PLATELETS: 125 10*3/uL — AB (ref 150–400)
RBC: 4.31 MIL/uL (ref 4.22–5.81)
RDW: 14.1 % (ref 11.5–15.5)
WBC: 8.5 10*3/uL (ref 4.0–10.5)

## 2013-04-09 LAB — INFLUENZA PANEL BY PCR (TYPE A & B)
H1N1FLUPCR: NOT DETECTED
INFLBPCR: NEGATIVE
Influenza A By PCR: NEGATIVE

## 2013-04-09 LAB — BASIC METABOLIC PANEL
BUN: 20 mg/dL (ref 6–23)
CO2: 23 mEq/L (ref 19–32)
Calcium: 8.8 mg/dL (ref 8.4–10.5)
Chloride: 106 mEq/L (ref 96–112)
Creatinine, Ser: 1.71 mg/dL — ABNORMAL HIGH (ref 0.50–1.35)
GFR calc non Af Amer: 37 mL/min — ABNORMAL LOW (ref >90)
GFR, EST AFRICAN AMERICAN: 43 mL/min — AB (ref >90)
Glucose, Bld: 113 mg/dL — ABNORMAL HIGH (ref 70–99)
Potassium: 3.7 mEq/L (ref 3.7–5.3)
Sodium: 141 mEq/L (ref 137–147)

## 2013-04-09 MED ORDER — ENOXAPARIN SODIUM 40 MG/0.4ML ~~LOC~~ SOLN
40.0000 mg | SUBCUTANEOUS | Status: DC
  Filled 2013-04-09 (×2): qty 0.4

## 2013-04-09 MED ORDER — AMPHETAMINE-DEXTROAMPHETAMINE 10 MG PO TABS
20.0000 mg | ORAL_TABLET | Freq: Two times a day (BID) | ORAL | Status: DC
Start: 2013-04-09 — End: 2013-04-11
  Administered 2013-04-09 – 2013-04-11 (×5): 20 mg via ORAL
  Filled 2013-04-09 (×5): qty 2

## 2013-04-09 MED ORDER — ENOXAPARIN SODIUM 30 MG/0.3ML ~~LOC~~ SOLN
30.0000 mg | SUBCUTANEOUS | Status: DC
  Filled 2013-04-09: qty 0.3

## 2013-04-09 MED ORDER — DOXAZOSIN MESYLATE 4 MG PO TABS
4.0000 mg | ORAL_TABLET | Freq: Every day | ORAL | Status: DC
  Administered 2013-04-09 – 2013-04-11 (×3): 4 mg via ORAL
  Filled 2013-04-09 (×3): qty 1

## 2013-04-09 MED ORDER — ASPIRIN EC 81 MG PO TBEC
81.0000 mg | DELAYED_RELEASE_TABLET | Freq: Every day | ORAL | Status: DC
  Administered 2013-04-09 – 2013-04-11 (×3): 81 mg via ORAL
  Filled 2013-04-09 (×3): qty 1

## 2013-04-09 MED ORDER — ONDANSETRON HCL 4 MG PO TABS: 4.0000 mg | ORAL_TABLET | Freq: Four times a day (QID) | ORAL | Status: DC | PRN

## 2013-04-09 MED ORDER — PREDNISONE 50 MG PO TABS
50.0000 mg | ORAL_TABLET | Freq: Every day | ORAL | Status: DC
  Administered 2013-04-10 – 2013-04-11 (×2): 50 mg via ORAL
  Filled 2013-04-09 (×3): qty 1

## 2013-04-09 MED ORDER — MINOXIDIL 10 MG PO TABS
10.0000 mg | ORAL_TABLET | Freq: Every day | ORAL | Status: DC
  Administered 2013-04-09 – 2013-04-10 (×3): 10 mg via ORAL
  Filled 2013-04-09 (×5): qty 1

## 2013-04-09 MED ORDER — ALUM & MAG HYDROXIDE-SIMETH 200-200-20 MG/5ML PO SUSP
30.0000 mL | Freq: Four times a day (QID) | ORAL | Status: DC | PRN
  Administered 2013-04-10 (×2): 30 mL via ORAL
  Filled 2013-04-09 (×3): qty 30

## 2013-04-09 MED ORDER — MAGNESIUM 300 MG PO CAPS: 1.0000 | ORAL_CAPSULE | Freq: Every day | ORAL | Status: DC

## 2013-04-09 MED ORDER — ALBUTEROL SULFATE (2.5 MG/3ML) 0.083% IN NEBU: 5.0000 mg | INHALATION_SOLUTION | RESPIRATORY_TRACT | Status: AC | PRN

## 2013-04-09 MED ORDER — SODIUM CHLORIDE 0.9 % IV SOLN
INTRAVENOUS | Status: DC
  Administered 2013-04-09 – 2013-04-11 (×4): via INTRAVENOUS

## 2013-04-09 MED ORDER — ACETAMINOPHEN 650 MG RE SUPP
650.0000 mg | Freq: Four times a day (QID) | RECTAL | Status: DC | PRN
Start: 2013-04-09 — End: 2013-04-11

## 2013-04-09 MED ORDER — ONDANSETRON HCL 4 MG/2ML IJ SOLN: 4.0000 mg | Freq: Four times a day (QID) | INTRAMUSCULAR | Status: DC | PRN

## 2013-04-09 MED ORDER — OXYCODONE HCL 5 MG PO TABS: 5.0000 mg | ORAL_TABLET | ORAL | Status: DC | PRN

## 2013-04-09 MED ORDER — GEMFIBROZIL 600 MG PO TABS
600.0000 mg | ORAL_TABLET | Freq: Two times a day (BID) | ORAL | Status: DC
  Administered 2013-04-09 – 2013-04-11 (×5): 600 mg via ORAL
  Filled 2013-04-09 (×7): qty 1

## 2013-04-09 MED ORDER — METHYLPREDNISOLONE SODIUM SUCC 125 MG IJ SOLR
125.0000 mg | INTRAMUSCULAR | Status: AC
  Administered 2013-04-09: 125 mg via INTRAVENOUS
  Filled 2013-04-09: qty 2

## 2013-04-09 MED ORDER — ACETAMINOPHEN 325 MG PO TABS: 650.0000 mg | ORAL_TABLET | Freq: Four times a day (QID) | ORAL | Status: DC | PRN

## 2013-04-09 MED ORDER — QUETIAPINE FUMARATE 50 MG PO TABS
50.0000 mg | ORAL_TABLET | Freq: Every day | ORAL | Status: DC
  Administered 2013-04-09 – 2013-04-10 (×3): 50 mg via ORAL
  Filled 2013-04-09 (×4): qty 1

## 2013-04-09 MED ORDER — ATENOLOL 100 MG PO TABS
100.0000 mg | ORAL_TABLET | Freq: Every day | ORAL | Status: DC
  Administered 2013-04-09 – 2013-04-11 (×3): 100 mg via ORAL
  Filled 2013-04-09 (×3): qty 1

## 2013-04-09 MED ORDER — MAGNESIUM OXIDE 400 (241.3 MG) MG PO TABS
200.0000 mg | ORAL_TABLET | Freq: Every day | ORAL | Status: DC
  Administered 2013-04-09 – 2013-04-11 (×3): 200 mg via ORAL
  Filled 2013-04-09 (×3): qty 0.5

## 2013-04-09 MED ORDER — BENZONATATE 100 MG PO CAPS
100.0000 mg | ORAL_CAPSULE | Freq: Three times a day (TID) | ORAL | Status: DC | PRN
  Administered 2013-04-09 – 2013-04-10 (×2): 100 mg via ORAL
  Filled 2013-04-09 (×2): qty 1

## 2013-04-09 MED ORDER — LEVOFLOXACIN 500 MG PO TABS
500.0000 mg | ORAL_TABLET | Freq: Every day | ORAL | Status: DC
  Administered 2013-04-09 (×2): 500 mg via ORAL
  Filled 2013-04-09 (×3): qty 1

## 2013-04-09 MED ORDER — IPRATROPIUM-ALBUTEROL 0.5-2.5 (3) MG/3ML IN SOLN
3.0000 mL | Freq: Two times a day (BID) | RESPIRATORY_TRACT | Status: DC
  Administered 2013-04-09 – 2013-04-10 (×3): 3 mL via RESPIRATORY_TRACT
  Filled 2013-04-09 (×4): qty 3

## 2013-04-09 MED ORDER — VITAMIN D3 25 MCG (1000 UNIT) PO TABS
5000.0000 [IU] | ORAL_TABLET | Freq: Two times a day (BID) | ORAL | Status: DC
  Administered 2013-04-09 – 2013-04-11 (×6): 5000 [IU] via ORAL
  Filled 2013-04-09 (×8): qty 5

## 2013-04-09 MED ORDER — ZOLPIDEM TARTRATE 5 MG PO TABS
5.0000 mg | ORAL_TABLET | Freq: Every evening | ORAL | Status: DC | PRN
  Administered 2013-04-10: 5 mg via ORAL
  Filled 2013-04-09: qty 1

## 2013-04-09 MED ORDER — HYDROMORPHONE HCL PF 1 MG/ML IJ SOLN: 0.5000 mg | INTRAMUSCULAR | Status: DC | PRN

## 2013-04-09 MED ORDER — SODIUM CHLORIDE 0.9 % IV SOLN
INTRAVENOUS | Status: DC
Start: 2013-04-09 — End: 2013-04-09

## 2013-04-09 MED ORDER — LEVOFLOXACIN 500 MG PO TABS: 500.0000 mg | ORAL_TABLET | Freq: Every day | ORAL | Status: DC

## 2013-04-09 MED ORDER — OMEGA-3-ACID ETHYL ESTERS 1 G PO CAPS
1.0000 g | ORAL_CAPSULE | Freq: Every day | ORAL | Status: DC
  Administered 2013-04-09 – 2013-04-11 (×3): 1 g via ORAL
  Filled 2013-04-09 (×3): qty 1

## 2013-04-09 MED ORDER — CLOPIDOGREL BISULFATE 75 MG PO TABS
75.0000 mg | ORAL_TABLET | Freq: Every day | ORAL | Status: DC
  Administered 2013-04-09 – 2013-04-11 (×3): 75 mg via ORAL
  Filled 2013-04-09 (×4): qty 1

## 2013-04-09 NOTE — Progress Notes (Signed)
[facility] PROGRESS NOTE  Lance Ramirez Lance Ramirez DOB: Aug 01, 1933 DOA: 04/08/2013 PCP: Lance Richards, MD   Brief narrative 80 year old male with history of hypertension, GERD, history of stroke we some impaired memory who presented with Sore throat cough and fever of 140 Fahrenheit since one day. He also has decreased appetite along with dyspnea on  Exertion. Patient admitted for acute bronchitis and URI symptoms and also findings of acute kidney injury.  Assessment/Plan: Acute bronchitis/URI Continue Duonebs. Will switch to oral prednisone, O2 via nasal cannula. On empiric Levaquin. Check flu PCR.  Hypertension Continue atenolol and Cardura  Hx of CVA Continue aspirin, Plavix and gemfibrozil  Acute kidney injury Possibly prerenal. Patient does report taking over-the-counter Advil for pain. Monitor with IV hydration.  Diet: Cardiac DVT prophylaxis   Code Status: Full code Family Communication: None at bedside  Disposition Plan: Home once improved   Consultants:  None  Procedures:  None  Antibiotics:  Levaquin  HPI/Subjective: Patient informs his throat congestion and shortness of breath to be better. Afebrile  Objective: Filed Vitals:   04/09/13 0753  BP: 110/63  Pulse: 91  Temp: 98 F (36.7 C)  Resp: 18    Intake/Output Summary (Last 24 hours) at 04/09/13 1036 Last data filed at 04/09/13 0620  Gross per 24 hour  Intake 373.75 ml  Output    300 ml  Net  73.75 ml   Filed Weights   04/09/13 0126  Weight: 83.5 kg (184 lb 1.4 oz)    Exam:   General: Elderly male in no acute distress  HEENT: No pallor, moist oral mucosa  Chest: Clear to auscultation bilaterally, minus sounds  CVS: Normal S1 and S2, no murmur or gallop  Abdomen: Soft, nontender, nondistended, bowel sounds present  Extremities: Warm, no edema  CNS: AAO x3, has some confusion during conversation    Data Reviewed: Basic Metabolic Panel:  Recent  Labs Lab 04/08/13 2106 04/09/13 0536  NA 137 141  K 4.0 3.7  CL 103 106  CO2 22 23  GLUCOSE 123* 113*  BUN 23 20  CREATININE 1.95* 1.71*  CALCIUM 9.2 8.8   Liver Function Tests: No results found for this basename: AST, ALT, ALKPHOS, BILITOT, PROT, ALBUMIN,  in the last 168 hours No results found for this basename: LIPASE, AMYLASE,  in the last 168 hours No results found for this basename: AMMONIA,  in the last 168 hours CBC:  Recent Labs Lab 04/08/13 2106 04/09/13 0536  WBC 9.0 8.5  NEUTROABS 6.8  --   HGB 13.1 12.2*  HCT 38.2* 36.7*  MCV 84.7 85.2  PLT 138* 125*   Cardiac Enzymes: No results found for this basename: CKTOTAL, CKMB, CKMBINDEX, TROPONINI,  in the last 168 hours BNP (last 3 results) No results found for this basename: PROBNP,  in the last 8760 hours CBG: No results found for this basename: GLUCAP,  in the last 168 hours  No results found for this or any previous visit (from the past 240 hour(s)).   Studies: Dg Chest 2 View  04/08/2013   CLINICAL DATA:  Shortness of breath, dizzy, nonsmoker  EXAM: CHEST  2 VIEW  COMPARISON:  05/15/2012  FINDINGS: Heart size upper normal. Aorta on coiled. Vascular pattern normal. Lungs clear.  IMPRESSION: No change from prior study.  No acute findings.   Electronically Signed   By: Skipper Cliche M.D.   On: 04/08/2013 21:41    Scheduled Meds: . amphetamine-dextroamphetamine  20 mg Oral BID  . aspirin  EC  81 mg Oral Daily  . atenolol  100 mg Oral Daily  . cholecalciferol  5,000 Units Oral BID  . clopidogrel  75 mg Oral Q breakfast  . dextromethorphan-guaiFENesin  1 tablet Oral BID  . doxazosin  4 mg Oral Daily  . enoxaparin (LOVENOX) injection  40 mg Subcutaneous Q24H  . gemfibrozil  600 mg Oral BID AC  . ipratropium-albuterol  3 mL Nebulization BID  . levofloxacin  500 mg Oral QHS  . magnesium oxide  200 mg Oral Daily  . minoxidil  10 mg Oral QHS  . omega-3 acid ethyl esters  1 g Oral Daily  . QUEtiapine  50 mg  Oral QHS   Continuous Infusions: . sodium chloride    . sodium chloride    . sodium chloride 75 mL/hr at 04/09/13 0121  . sodium chloride         Time spent: 25 minutes    Lance Ramirez  [facility] Pager [phone]. If 7PM-7AM, please contact night-coverage at [email or web address], password [email or web address] 04/09/2013, 10:36 AM  LOS: 1 day

## 2013-04-09 NOTE — ED Notes (Signed)
For any questions, please call pt's daughter Anderson Malta at [phone]

## 2013-04-09 NOTE — H&P (Signed)
[facility] History and Physical  Lance Ramirez [id number] DOB: 04/27/1933 DOA: 04/08/2013  Referring physician:  EDP PCP: Lance Richards, MD  Specialists:   Chief Complaint:  Fevers Chills SOB Cough   HPI: Lance Ramirez is a 80 y.o. male with Cough, SOB and Fevers and Chills  X 5 days.   He has also had Wheezing and chest Tightness.  He has had fevers to 102 at home, and his white is sick with similar symptoms.   He e has had decreased intake of foods and liquids due to loss of appetite.   He has not had any nausea or vomiting or diarrhea.       Review of Systems:  Constitutional: +Appetite Loss, No Night sweats, +Fevers, Chills, +Fatigue, +Generalized Weakness HEENT: No headaches, Difficulty swallowing,Tooth/dental problems,Sore throat,  No sneezing, itching, ear ache, nasal congestion, post nasal drip,  Cardio-vascular:  No chest pain, Orthopnea, PND, Edema in lower extremities, Anasarca, +Dizziness, No palpitations  Resp: +SOB, DOE. +Productive cough, No hemoptysis, +Wheezing.  No chest wall deformity GI: No heartburn, indigestion, abdominal pain, nausea, vomiting, diarrhea, change in bowel habits.     GU: no dysuria, change in color of urine, no urgency or frequency. No flank pain.  Musculoskeletal: No joint pain or swelling. No decreased range of motion. No back pain.  Neurologic: No syncope, No Seizures, +Generalized Weakness, Paresthesia, Vision disturbance or Loss, No Diplopia, No Vertigo, No Difficulty Walking,  Skin: no rash or lesions. Psych: No change in mood or affect. No depression or anxiety. No confusion or hallucinations   Past Medical History  Diagnosis Date  . TIA (transient ischemic attack)   . Confusion   . H/O dizziness   . Arthritis   . GERD (gastroesophageal reflux disease)   . Hypertension   . Hyperlipidemia   . 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 Vitamin D deficiency   . Other  testicular hypofunction      Past Surgical History  Procedure Laterality Date  . Hernia repair    . Back surgery    . 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 Anterior cervical decomp/discectomy fusion N/A 10/25/2012    Procedure: ANTERIOR CERVICAL DECOMPRESSION/DISCECTOMY FUSION CERVICAL THREE-FOUR 1 LEVEL/HARDWARE REMOVAL;  Surgeon: Lance Charter, MD;  Location: [facility];  Service: Neurosurgery;  Laterality: N/A;  Cervical three-four anterior cervical decompression with fusion interbody prothesis plating and bonegraft with removal of old Premier plate    Prior to Admission medications   Medication Sig Start Date End Date Taking? Authorizing Provider  amphetamine-dextroamphetamine (ADDERALL) 20 MG tablet Take 1 tablet (20 mg total) by mouth 2 (two) times daily. 03/21/13 03/21/14 Yes Melissa R Smith, PA-C  aspirin EC 81 MG tablet Take 81 mg by mouth daily.   Yes Historical Provider, MD  atenolol (TENORMIN) 100 MG tablet Take 100 mg by mouth daily.     Yes Historical Provider, MD  Cholecalciferol (VITAMIN D-3) 5000 UNITS TABS Take 1 tablet by mouth 2 (two) times daily.   Yes Historical Provider, MD  clopidogrel (PLAVIX) 75 MG tablet Take 75 mg by mouth daily.   Yes Historical Provider, MD  diphenhydramine-acetaminophen (TYLENOL PM) 25-500 MG TABS Take 1 tablet by mouth at bedtime as needed.   Yes Historical Provider, MD  doxazosin (CARDURA) 4 MG tablet Take 4 mg by mouth daily.   Yes Historical Provider, MD  gemfibrozil (LOPID) 600 MG  tablet Take 600 mg by mouth 2 (two) times daily before a meal.   Yes Historical Provider, MD  losartan (COZAAR) 100 MG tablet Take 100 mg by mouth at bedtime.    Yes Historical Provider, MD  Magnesium 300 MG CAPS Take 1 capsule by mouth daily.   Yes Historical Provider, MD  minoxidil (LONITEN) 10 MG tablet Take 10 mg by mouth at bedtime.    Yes Historical Provider, MD  Omega-3 Fatty Acids (FISH OIL) 1000 MG CPDR Take 1 capsule by mouth  daily.   Yes Historical Provider, MD  QUEtiapine (SEROQUEL) 50 MG tablet Take 50 mg by mouth at bedtime.   Yes Historical Provider, MD  sildenafil (VIAGRA) 100 MG tablet Take 1 tablet (100 mg total) by mouth as needed for erectile dysfunction. 03/01/13 03/01/14 Yes Unk Pinto, MD  testosterone cypionate (DEPOTESTOTERONE CYPIONATE) 100 MG/ML injection Inject 200 mg into the muscle every 14 (fourteen) days. For IM use only   Yes Historical Provider, MD     No Known Allergies   Social History:  Married   reports that he quit smoking about 35 years ago. His smoking use included Cigarettes. He has a 30 pack-year smoking history. He quit smokeless tobacco use about 35 years ago. His smokeless tobacco use included Chew. He reports that he does not drink alcohol or use illicit drugs.     Family History  Problem Relation Age of Onset  . Sudden death Mother   . Other Father        Physical Exam:  GEN:  Pleasant Elderly Well Nourished and Well Developed  80 y.o. Caucasian male  examined  and in no acute distress; cooperative with exam Filed Vitals:   04/08/13 2024 04/08/13 2050 04/08/13 2242  BP: 131/68    Pulse: 101    Temp: 98.7 F (37.1 C)    TempSrc: Oral    Resp: 18    SpO2: 96% 95% 94%   Blood pressure 131/68, pulse 101, temperature 98.7 F (37.1 C), temperature source Oral, resp. rate 18, SpO2 94.00%. PSYCH: He is alert and oriented x2; does not appear anxious does not appear depressed; affect is normal HEENT: Normocephalic and Atraumatic, Mucous membranes pink; PERRLA; EOM intact; Fundi:  Benign;  No scleral icterus, Nares: Patent, Oropharynx: Clear, Fair Dentition, Neck:  FROM, no cervical lymphadenopathy nor thyromegaly or carotid bruit; no JVD; Breasts:: Not examined CHEST WALL: No tenderness CHEST: Normal respiration, clear to auscultation bilaterally HEART: Regular rate and rhythm; no murmurs rubs or gallops BACK: No kyphosis or scoliosis; no CVA tenderness ABDOMEN:  Positive Bowel Sounds, soft non-tender; no masses, no organomegaly. Rectal Exam: Not done EXTREMITIES: No cyanosis, clubbing or edema; no ulcerations. Genitalia: not examined PULSES: 2+ and symmetric SKIN: Normal hydration no rash or ulceration CNS:  A XO X 2,  CN II- XII intact, Mild Right Hemiparesis (chronic) Vascular: pulses palpable throughout    Labs on Admission:  Basic Metabolic Panel:  Recent Labs Lab 04/08/13 2106  NA 137  K 4.0  CL 103  CO2 22  GLUCOSE 123*  BUN 23  CREATININE 1.95*  CALCIUM 9.2   Liver Function Tests: No results found for this basename: AST, ALT, ALKPHOS, BILITOT, PROT, ALBUMIN,  in the last 168 hours No results found for this basename: LIPASE, AMYLASE,  in the last 168 hours No results found for this basename: AMMONIA,  in the last 168 hours CBC:  Recent Labs Lab 04/08/13 2106  WBC 9.0  NEUTROABS 6.8  HGB  13.1  HCT 38.2*  MCV 84.7  PLT 138*   Cardiac Enzymes: No results found for this basename: CKTOTAL, CKMB, CKMBINDEX, TROPONINI,  in the last 168 hours  BNP (last 3 results) No results found for this basename: PROBNP,  in the last 8760 hours CBG: No results found for this basename: GLUCAP,  in the last 168 hours  Radiological Exams on Admission: Dg Chest 2 View  04/08/2013   CLINICAL DATA:  Shortness of breath, dizzy, nonsmoker  EXAM: CHEST  2 VIEW  COMPARISON:  05/15/2012  FINDINGS: Heart size upper normal. Aorta on coiled. Vascular pattern normal. Lungs clear.  IMPRESSION: No change from prior study.  No acute findings.   Electronically Signed   By: Skipper Cliche M.D.   On: 04/08/2013 21:41      Assessment/Plan:   81 y.o. male with  Principal Problem:   ARF (acute renal failure) Active Problems:   Bronchitis with bronchospasm   SOB (shortness of breath)   Hyperlipidemia   CVA (cerebral infarction)    1.   ARF-   Rehydrate Gently.   Monitor BUN.Cr.     2.   Acute Bronchitis with Bronchospasm/ Acute URI-   DUONebs,  IV Steroids, O2 PRN, Empiric Oral Levaquin Rx, and Check  For Flu, Droplet Precautions.      3.   HTN-    Monitor BPs, continue Atenolol, Minoxidil, and hold Losartan due to Renal Insuff, restart when Cr improved.     3.  Hyperlipidemia-  Continue Gemfibrozil Rx.     4.  DVT prophylaxis with Lovenox.        Code Status:   FULL CODE Family Communication:    Daughter at Bedside Disposition Plan:        Observation  Time spent:  [facility] Pager [phone]  If 7PM-7AM, please contact night-coverage [email or web address] Password [email or web address] 04/09/2013, 12:02 AM

## 2013-04-09 NOTE — Progress Notes (Signed)
Utilization Review Completed.   Candelario Steppe, RN, BSN Nurse Case Manager  

## 2013-04-10 ENCOUNTER — Observation Stay (HOSPITAL_COMMUNITY): Payer: Medicare Other

## 2013-04-10 LAB — URINALYSIS, ROUTINE W REFLEX MICROSCOPIC
BILIRUBIN URINE: NEGATIVE
Glucose, UA: NEGATIVE mg/dL
KETONES UR: NEGATIVE mg/dL
Leukocytes, UA: NEGATIVE
NITRITE: NEGATIVE
PH: 5.5 (ref 5.0–8.0)
PROTEIN: 100 mg/dL — AB
Specific Gravity, Urine: 1.021 (ref 1.005–1.030)
Urobilinogen, UA: 1 mg/dL (ref 0.0–1.0)

## 2013-04-10 LAB — BASIC METABOLIC PANEL
BUN: 28 mg/dL — AB (ref 6–23)
CO2: 23 meq/L (ref 19–32)
CREATININE: 1.7 mg/dL — AB (ref 0.50–1.35)
Calcium: 8.9 mg/dL (ref 8.4–10.5)
Chloride: 106 mEq/L (ref 96–112)
GFR calc Af Amer: 43 mL/min — ABNORMAL LOW (ref >90)
GFR calc non Af Amer: 37 mL/min — ABNORMAL LOW (ref >90)
Glucose, Bld: 125 mg/dL — ABNORMAL HIGH (ref 70–99)
POTASSIUM: 3.6 meq/L — AB (ref 3.7–5.3)
Sodium: 139 mEq/L (ref 137–147)

## 2013-04-10 LAB — URINE MICROSCOPIC-ADD ON

## 2013-04-10 MED ORDER — HEPARIN SODIUM (PORCINE) 5000 UNIT/ML IJ SOLN
5000.0000 [IU] | Freq: Three times a day (TID) | INTRAMUSCULAR | Status: DC
  Administered 2013-04-10 – 2013-04-11 (×4): 5000 [IU] via SUBCUTANEOUS
  Filled 2013-04-10 (×7): qty 1

## 2013-04-10 MED ORDER — LEVOFLOXACIN 750 MG PO TABS
750.0000 mg | ORAL_TABLET | ORAL | Status: DC
  Filled 2013-04-10: qty 1

## 2013-04-10 NOTE — Progress Notes (Signed)
[facility] PROGRESS NOTE  Lance Ramirez [id number] DOB: January 17, 1934 DOA: 04/08/2013 PCP: Alesia Richards, MD  Brief narrative  80 year old male with history of hypertension, GERD, history of stroke we some impaired memory who presented with Sore throat cough and fever of 140 Fahrenheit since one day. He also has decreased appetite along with dyspnea on Exertion. Patient admitted for acute bronchitis and URI symptoms and also findings of acute kidney injury.   Assessment/Plan:  Acute bronchitis/URI  Continue Duonebs. oral prednisone, O2 via nasal cannula. On empiric Levaquin. Negative flu PCR.   Hypertension  Continue atenolol and Cardura   Hx of CVA  Continue aspirin, Plavix and gemfibrozil   Acute kidney injury  Possibly prerenal. Patient does report taking over-the-counter Advil for pain. Renal function still not improved. Check UA and renal ultrasound. Continue IV hydration.   Diet: Cardiac   DVT prophylaxis   Code Status: Full code  Family Communication: None at bedside  Disposition Plan: Home in a.m. if renal function improved  Consultants:  None Procedures:  None Antibiotics:  Levaquin (dose adjusted per renal function )        HPI/Subjective: No overnight issues  Objective: Filed Vitals:   04/10/13 1132  BP: 128/76  Pulse:   Temp:   Resp:     Intake/Output Summary (Last 24 hours) at 04/10/13 1324 Last data filed at 04/10/13 1300  Gross per 24 hour  Intake   3260 ml  Output    400 ml  Net   2860 ml   Filed Weights   04/09/13 0126  Weight: 83.5 kg (184 lb 1.4 oz)    Exam:  General: Elderly male in no acute distress  HEENT: No pallor, moist oral mucosa  Chest: Clear to auscultation bilaterally,  CVS: Normal S1 and S2, no murmur or gallop  Abdomen: Soft, nontender, nondistended, bowel sounds present  Extremities: Warm, no edema  CNS: AAO x3, has some confusion during conversation  Data Reviewed: Basic Metabolic  Panel:  Recent Labs Lab 04/08/13 2106 04/09/13 0536 04/10/13 0512  NA 137 141 139  K 4.0 3.7 3.6*  CL 103 106 106  CO2 22 23 23   GLUCOSE 123* 113* 125*  BUN 23 20 28*  CREATININE 1.95* 1.71* 1.70*  CALCIUM 9.2 8.8 8.9   Liver Function Tests: No results found for this basename: AST, ALT, ALKPHOS, BILITOT, PROT, ALBUMIN,  in the last 168 hours No results found for this basename: LIPASE, AMYLASE,  in the last 168 hours No results found for this basename: AMMONIA,  in the last 168 hours CBC:  Recent Labs Lab 04/08/13 2106 04/09/13 0536  WBC 9.0 8.5  NEUTROABS 6.8  --   HGB 13.1 12.2*  HCT 38.2* 36.7*  MCV 84.7 85.2  PLT 138* 125*   Cardiac Enzymes: No results found for this basename: CKTOTAL, CKMB, CKMBINDEX, TROPONINI,  in the last 168 hours BNP (last 3 results) No results found for this basename: PROBNP,  in the last 8760 hours CBG: No results found for this basename: GLUCAP,  in the last 168 hours  No results found for this or any previous visit (from the past 240 hour(s)).   Studies: Dg Chest 2 View  04/08/2013   CLINICAL DATA:  Shortness of breath, dizzy, nonsmoker  EXAM: CHEST  2 VIEW  COMPARISON:  05/15/2012  FINDINGS: Heart size upper normal. Aorta on coiled. Vascular pattern normal. Lungs clear.  IMPRESSION: No change from prior study.  No acute findings.   Electronically Signed  By: Skipper Cliche M.D.   On: 04/08/2013 21:41   US Renal  04/10/2013   CLINICAL DATA:  Acute renal failure, hypertension.  EXAM: RENAL/URINARY TRACT ULTRASOUND COMPLETE  COMPARISON:  02/19/2012  FINDINGS: Right Kidney:  Length: 12.3 cm. There is a dominant simple appearing upper pole parapelvic cyst 51 x 54 x 55 mm. Smaller 2.5 cm exophytic upper pole cyst. Echogenicity within normal limits. No solid renal mass or hydronephrosis visualized.  Left Kidney:  Length: 2.2 cm. Echogenicity within normal limits. There is a 3 x 2 x 2.8 cm upper pole cyst. No solid renal mass or Hydronephrosis  visualized.  Bladder:  Appears normal for degree of bladder distention. Marked prostatic enlargement, 5.3 x 5.9 cm.  IMPRESSION: 1. Negative for hydronephrosis. 2. Bilateral renal cysts. 3. Prostatic enlargement.   Electronically Signed   By: Arne Cleveland M.D.   On: 04/10/2013 09:52    Scheduled Meds: . amphetamine-dextroamphetamine  20 mg Oral BID  . aspirin EC  81 mg Oral Daily  . atenolol  100 mg Oral Daily  . cholecalciferol  5,000 Units Oral BID  . clopidogrel  75 mg Oral Q breakfast  . dextromethorphan-guaiFENesin  1 tablet Oral BID  . doxazosin  4 mg Oral Daily  . gemfibrozil  600 mg Oral BID AC  . heparin subcutaneous  5,000 Units Subcutaneous Q8H  . ipratropium-albuterol  3 mL Nebulization BID  . [START ON 04/11/2013] levofloxacin  750 mg Oral Q48H  . magnesium oxide  200 mg Oral Daily  . minoxidil  10 mg Oral QHS  . omega-3 acid ethyl esters  1 g Oral Daily  . predniSONE  50 mg Oral Q breakfast  . QUEtiapine  50 mg Oral QHS   Continuous Infusions: . sodium chloride 75 mL/hr at 04/09/13 1419      Time spent: 25 minutes    Ahad Colarusso, [place]  [facility] Pager [phone]. If 7PM-7AM, please contact night-coverage at [email or web address], password [facility] 04/10/2013, 1:24 PM  LOS: 2 days

## 2013-04-10 NOTE — Progress Notes (Signed)
Pt ambulated  the whole length and a half of the hall with standby assist, tolerated well. Appetite poor. Family in to visit. Bed alarm on.

## 2013-04-11 LAB — BASIC METABOLIC PANEL
BUN: 22 mg/dL (ref 6–23)
CHLORIDE: 107 meq/L (ref 96–112)
CO2: 24 mEq/L (ref 19–32)
Calcium: 8.8 mg/dL (ref 8.4–10.5)
Creatinine, Ser: 1.33 mg/dL (ref 0.50–1.35)
GFR, EST AFRICAN AMERICAN: 58 mL/min — AB (ref >90)
GFR, EST NON AFRICAN AMERICAN: 50 mL/min — AB (ref >90)
Glucose, Bld: 104 mg/dL — ABNORMAL HIGH (ref 70–99)
Potassium: 3.8 mEq/L (ref 3.7–5.3)
SODIUM: 141 meq/L (ref 137–147)

## 2013-04-11 MED ORDER — BENZONATATE 200 MG PO CAPS: 200.0000 mg | ORAL_CAPSULE | Freq: Three times a day (TID) | ORAL | Status: DC | PRN

## 2013-04-11 MED ORDER — PREDNISONE 20 MG PO TABS: 40.0000 mg | ORAL_TABLET | Freq: Every day | ORAL | Status: DC

## 2013-04-11 MED ORDER — BENZONATATE 100 MG PO CAPS
200.0000 mg | ORAL_CAPSULE | Freq: Three times a day (TID) | ORAL | Status: DC | PRN
  Filled 2013-04-11: qty 2

## 2013-04-11 MED ORDER — IPRATROPIUM-ALBUTEROL 0.5-2.5 (3) MG/3ML IN SOLN: 3.0000 mL | Freq: Four times a day (QID) | RESPIRATORY_TRACT | Status: DC | PRN

## 2013-04-11 MED ORDER — LEVOFLOXACIN 750 MG PO TABS: 750.0000 mg | ORAL_TABLET | ORAL | Status: DC

## 2013-04-11 MED ORDER — DM-GUAIFENESIN ER 30-600 MG PO TB12: 1.0000 | ORAL_TABLET | Freq: Two times a day (BID) | ORAL | Status: DC

## 2013-04-11 MED ORDER — MENTHOL 3 MG MT LOZG
1.0000 | LOZENGE | OROMUCOSAL | Status: DC | PRN
  Filled 2013-04-11: qty 9

## 2013-04-11 NOTE — Progress Notes (Signed)
Discharge instructions explained to pt and his son, states understanding(son). Four prescriptions given to pt's son. Pt dc'd via wheelchair,going home.

## 2013-04-11 NOTE — Discharge Instructions (Signed)
Acute Kidney Injury Acute kidney injury is a disease in which there is sudden (acute) damage to the kidneys. The kidneys are 2 organs that lie on either side of the spine between the middle of the back and the front of the abdomen. The kidneys:  Remove wastes and extra water from the blood.   Produce important hormones. These help keep bones strong, regulate blood pressure, and help create red blood cells.   Balance the fluids and chemicals in the blood and tissues. A small amount of kidney damage may not cause problems, but a large amount of damage may make it difficult or impossible for the kidneys to work the way they should. Acute kidney injury may develop into long-lasting (chronic) kidney disease. It may also develop into a life-threatening disease called end-stage kidney disease. Acute kidney injury can get worse very quickly, so it should be treated right away. Early treatment may prevent other kidney diseases from developing.  CAUSES   A problem with blood flow to the kidneys. This may be caused by:   Blood loss.   Heart disease.   Severe burns.   Liver disease.  Direct damage to the kidneys. This may be caused by:  Some medicines.   A kidney infection.   Poisoning or consuming toxic substances.   A surgical wound.   A blow to the kidney area.   A problem with urine flow. This may be caused by:   Cancer.   Kidney stones.   An enlarged prostate. SYMPTOMS   Swelling (edema) of the legs, ankles, or feet.   Tiredness (lethargy).   Nausea or vomiting.   Confusion.   Problems with urination, such as:   Painful or burning feeling during urination.   Decreased urine production.   Frequent accidents in children who are potty trained.   Bloody urine.   Muscle twitches and cramps.   Shortness of breath.   Seizures.   Chest pain or pressure. Sometimes, no symptoms are present. DIAGNOSIS Acute kidney injury may be detected  and diagnosed by tests, including blood, urine, imaging, or kidney biopsy tests.  TREATMENT Treatment of acute kidney injury varies depending on the cause and severity of the kidney damage. In mild cases, no treatment may be needed. The kidneys may heal on their own. If acute kidney injury is more severe, your caregiver will treat the cause of the kidney damage, help the kidneys heal, and prevent complications from occurring. Severe cases may require a procedure to remove toxic wastes from the body (dialysis) or surgery to repair kidney damage. Surgery may involve:   Repair of a torn kidney.   Removal of an obstruction. Most of the time, you will need to stay overnight at the hospital.  HOME CARE INSTRUCTIONS:  Follow your prescribed diet.  Only take over-the-counter or prescription medicines as directed by your caregiver.  Do not take any new medicines (prescription, over-the-counter, or nutritional supplements) unless approved by your caregiver. Many medicines can worsen your kidney damage or need to have the dose adjusted.   Keep all follow-up appointments as directed by your caregiver.  Observe your condition to make sure you are healing as expected. SEEK IMMEDIATE MEDICAL CARE IF:  You are feeling ill or have severe pain in the back or side.   Your symptoms return or you have new symptoms.  You have any symptoms of end-stage kidney disease. These include:   Persistent itchiness.   Loss of appetite.   Headaches.   Abnormally dark  or light skin.  Numbness in the hands or feet.   Easy bruising.   Frequent hiccups.   Menstruation stops.   You have a fever.  You have increased urine production.  You have pain or bleeding when urinating. MAKE SURE YOU:   Understand these instructions.  Will watch your condition.  Will get help right away if you are not doing well or get worse Document Released: 09/09/2010 Document Revised: 06/21/2012 Document  Reviewed: 10/24/2011 [facility] Patient Information 2014 [place].

## 2013-04-11 NOTE — Discharge Summary (Signed)
Physician Discharge Summary  [place] [id number] DOB: Feb 09, 1934 DOA: 04/08/2013  PCP: Alesia Richards, MD  Admit date: 04/08/2013 Discharge date: 04/11/2013  Time spent: 40 minutes  Recommendations for Outpatient Follow-up:  1. Home with outpt PCP follow up. Please check renal function upon outpatient visit and if stable resume his losartan.  Discharge Diagnoses:  Principal Problem:   Acute Bronchitis with bronchospasm  Active Problems:   Acute renal failure   Hypertension   Hyperlipidemia   Hx of CVA (cerebral infarction)   SOB (shortness of breath)   Discharge Condition: fair  Diet recommendation: cardiac  Filed Weights   04/09/13 0126  Weight: 83.5 kg (184 lb 1.4 oz)    History of present illness:  Please refer to admission H&P for details, but in brief,80 year old male with history of hypertension, GERD, history of stroke we some impaired memory who presented with Sore throat cough and fever of 100.4 Fahrenheit since one day. He also has decreased appetite along with dyspnea on Exertion. Patient admitted for acute bronchitis and URI symptoms and also findings of acute kidney injury.    Hospital Course:  Acute bronchitis/URI  Improved with  Duonebs. oral prednisone, O2 via nasal cannula. Started On empiric Levaquin. Negative flu PCR.  Will discharge on oral prednisone ( 40 mg daily) for 5 days and oral Levaquin to complete a 5 day course.  Hypertension  Continue atenolol and Cardura . Hold losartan given AKI.  Hx of CVA  Continue aspirin, Plavix and gemfibrozil   Acute kidney injury  Possibly prerenal. Patient does report taking over-the-counter Advil for pain and is also on losartan at home.. Renal function improved this morning with hydration.  Check UA shows hematuria.and renal ultrasound shows enlarged prostate. patient already on cardura. H&H stable. Needs outpt follow up.  Will hold losartan. Check repeat renal fn in 1 week and resume if  stable.  Diet: Cardiac    Code Status: Full code  Family Communication: None at bedside  Disposition Plan: Home  Consultants:  None   Procedures:  None   Antibiotics:  Levaquin   Discharge Exam: Filed Vitals:   04/11/13 0933  BP: 129/70  Pulse: 99  Temp: 97.8 F (36.6 C)  Resp: 17   General: Elderly male in no acute distress  HEENT: No pallor, moist oral mucosa  Chest: Clear to auscultation bilaterally,  CVS: Normal S1 and S2, no murmur or gallop  Abdomen: Soft, nontender, nondistended, bowel sounds present  Extremities: Warm, no edema  CNS: AAO x3,    Discharge Instructions   Future Appointments Provider Department Dept Phone   05/17/2013 2:30 PM Unk Pinto, MD [place] [facility] [phone]   11/17/2013 3:45 PM Unk Pinto, MD [place] [facility] [phone]       Medication List    STOP taking these medications       losartan 100 MG tablet  Commonly known as:  COZAAR      TAKE these medications       amphetamine-dextroamphetamine 20 MG tablet  Commonly known as:  ADDERALL  Take 1 tablet (20 mg total) by mouth 2 (two) times daily.     aspirin EC 81 MG tablet  Take 81 mg by mouth daily.     atenolol 100 MG tablet  Commonly known as:  TENORMIN  Take 100 mg by mouth daily.     benzonatate 200 MG capsule  Commonly known as:  TESSALON  Take 1 capsule (200 mg total) by mouth  3 (three) times daily as needed for cough.     clopidogrel 75 MG tablet  Commonly known as:  PLAVIX  Take 75 mg by mouth daily.     dextromethorphan-guaiFENesin 30-600 MG per 12 hr tablet  Commonly known as:  MUCINEX DM  Take 1 tablet by mouth 2 (two) times daily.     diphenhydramine-acetaminophen 25-500 MG Tabs  Commonly known as:  TYLENOL PM  Take 1 tablet by mouth at bedtime as needed.     doxazosin 4 MG tablet  Commonly known as:  CARDURA  Take 4 mg by mouth daily.     Fish Oil 1000 MG Cpdr   Take 1 capsule by mouth daily.     gemfibrozil 600 MG tablet  Commonly known as:  LOPID  Take 600 mg by mouth 2 (two) times daily before a meal.     levofloxacin 750 MG tablet  Commonly known as:  LEVAQUIN  Take 1 tablet (750 mg total) by mouth every other day.until 2/6     Magnesium 300 MG Caps  Take 1 capsule by mouth daily.     minoxidil 10 MG tablet  Commonly known as:  LONITEN  Take 10 mg by mouth at bedtime.     predniSONE 20 MG tablet  Commonly known as:  DELTASONE  Take 2 tablets (40 mg total) by mouth daily with breakfast.( for 5 days)     QUEtiapine 50 MG tablet  Commonly known as:  SEROQUEL  Take 50 mg by mouth at bedtime.     sildenafil 100 MG tablet  Commonly known as:  VIAGRA  Take 1 tablet (100 mg total) by mouth as needed for erectile dysfunction.     testosterone cypionate 100 MG/ML injection  Commonly known as:  DEPOTESTOTERONE CYPIONATE  Inject 200 mg into the muscle every 14 (fourteen) days. For IM use only     Vitamin D-3 5000 UNITS Tabs  Take 1 tablet by mouth 2 (two) times daily.       No Known Allergies     Follow-up Information   Follow up with MCKEOWN,WILLIAM DAVID, MD. Schedule an appointment as soon as possible for a visit in 1 week.   Specialty:  Internal Medicine   Contact information:   [address] [place] [address] [phone]        The results of significant diagnostics from this hospitalization (including imaging, microbiology, ancillary and laboratory) are listed below for reference.    Significant Diagnostic Studies: Dg Chest 2 View  04/08/2013   CLINICAL DATA:  Shortness of breath, dizzy, nonsmoker  EXAM: CHEST  2 VIEW  COMPARISON:  05/15/2012  FINDINGS: Heart size upper normal. Aorta on coiled. Vascular pattern normal. Lungs clear.  IMPRESSION: No change from prior study.  No acute findings.   Electronically Signed   By: Skipper Cliche M.D.   On: 04/08/2013 21:41   US Renal  04/10/2013    CLINICAL DATA:  Acute renal failure, hypertension.  EXAM: RENAL/URINARY TRACT ULTRASOUND COMPLETE  COMPARISON:  02/19/2012  FINDINGS: Right Kidney:  Length: 12.3 cm. There is a dominant simple appearing upper pole parapelvic cyst 51 x 54 x 55 mm. Smaller 2.5 cm exophytic upper pole cyst. Echogenicity within normal limits. No solid renal mass or hydronephrosis visualized.  Left Kidney:  Length: 2.2 cm. Echogenicity within normal limits. There is a 3 x 2 x 2.8 cm upper pole cyst. No solid renal mass or Hydronephrosis visualized.  Bladder:  Appears normal for  degree of bladder distention. Marked prostatic enlargement, 5.3 x 5.9 cm.  IMPRESSION: 1. Negative for hydronephrosis. 2. Bilateral renal cysts. 3. Prostatic enlargement.   Electronically Signed   By: Arne Cleveland M.D.   On: 04/10/2013 09:52    Microbiology: No results found for this or any previous visit (from the past 240 hour(s)).   Labs: Basic Metabolic Panel:  Recent Labs Lab 04/08/13 2106 04/09/13 0536 04/10/13 0512 04/11/13 0705  NA 137 141 139 141  K 4.0 3.7 3.6* 3.8  CL 103 106 106 107  CO2 22 23 23 24   GLUCOSE 123* 113* 125* 104*  BUN 23 20 28* 22  CREATININE 1.95* 1.71* 1.70* 1.33  CALCIUM 9.2 8.8 8.9 8.8   Liver Function Tests: No results found for this basename: AST, ALT, ALKPHOS, BILITOT, PROT, ALBUMIN,  in the last 168 hours No results found for this basename: LIPASE, AMYLASE,  in the last 168 hours No results found for this basename: AMMONIA,  in the last 168 hours CBC:  Recent Labs Lab 04/08/13 2106 04/09/13 0536  WBC 9.0 8.5  NEUTROABS 6.8  --   HGB 13.1 12.2*  HCT 38.2* 36.7*  MCV 84.7 85.2  PLT 138* 125*   Cardiac Enzymes: No results found for this basename: CKTOTAL, CKMB, CKMBINDEX, TROPONINI,  in the last 168 hours BNP: BNP (last 3 results) No results found for this basename: PROBNP,  in the last 8760 hours CBG: No results found for this basename: GLUCAP,  in the last 168  hours     Signed:  Bayley Hurn  [facility] 04/11/2013, 9:41 AM

## 2013-04-11 NOTE — Plan of Care (Signed)
Problem: Discharge Progression Outcomes Goal: Discharge plan in place and appropriate Outcome: Completed/Met Date Met:  04/11/13 Son picking up pt and bringing him home to his wife

## 2013-04-15 ENCOUNTER — Ambulatory Visit: Payer: Self-pay | Admitting: Internal Medicine

## 2013-04-21 ENCOUNTER — Encounter: Payer: Self-pay | Admitting: Internal Medicine

## 2013-04-21 ENCOUNTER — Ambulatory Visit (INDEPENDENT_AMBULATORY_CARE_PROVIDER_SITE_OTHER): Payer: Medicare Other | Admitting: Internal Medicine

## 2013-04-21 VITALS — BP 132/68 | HR 68 | Temp 99.0°F | Resp 18 | Wt 185.8 lb

## 2013-04-21 DIAGNOSIS — Z79899 Other long term (current) drug therapy: Secondary | ICD-10-CM

## 2013-04-21 DIAGNOSIS — I10 Essential (primary) hypertension: Secondary | ICD-10-CM

## 2013-04-21 DIAGNOSIS — J0410 Acute tracheitis without obstruction: Secondary | ICD-10-CM

## 2013-04-21 MED ORDER — AMPHETAMINE-DEXTROAMPHETAMINE 20 MG PO TABS: ORAL_TABLET | ORAL | Status: DC

## 2013-04-21 MED ORDER — DOXYCYCLINE HYCLATE 100 MG PO CAPS: 100.0000 mg | ORAL_CAPSULE | Freq: Two times a day (BID) | ORAL | Status: DC

## 2013-04-21 NOTE — Patient Instructions (Signed)
Acute Bronchitis Bronchitis is inflammation of the airways that extend from the windpipe into the lungs (bronchi). The inflammation often causes mucus to develop. This leads to a cough, which is the most common symptom of bronchitis.  In acute bronchitis, the condition usually develops suddenly and goes away over time, usually in a couple weeks. Smoking, allergies, and asthma can make bronchitis worse. Repeated episodes of bronchitis may cause further lung problems.  CAUSES Acute bronchitis is most often caused by the same virus that causes a cold. The virus can spread from person to person (contagious).  SIGNS AND SYMPTOMS   Cough.   Fever.   Coughing up mucus.   Body aches.   Chest congestion.   Chills.   Shortness of breath.   Sore throat.  DIAGNOSIS  Acute bronchitis is usually diagnosed through a physical exam. Tests, such as chest X-rays, are sometimes done to rule out other conditions.  TREATMENT  Acute bronchitis usually goes away in a couple weeks. Often times, no medical treatment is necessary. Medicines are sometimes given for relief of fever or cough. Antibiotics are usually not needed but may be prescribed in certain situations. In some cases, an inhaler may be recommended to help reduce shortness of breath and control the cough. A cool mist vaporizer may also be used to help thin bronchial secretions and make it easier to clear the chest.  HOME CARE INSTRUCTIONS  Get plenty of rest.   Drink enough fluids to keep your urine clear or pale yellow (unless you have a medical condition that requires fluid restriction). Increasing fluids may help thin your secretions and will prevent dehydration.   Only take over-the-counter or prescription medicines as directed by your health care provider.   Avoid smoking and secondhand smoke. Exposure to cigarette smoke or irritating chemicals will make bronchitis worse. If you are a smoker, consider using nicotine gum or skin  patches to help control withdrawal symptoms. Quitting smoking will help your lungs heal faster.   Reduce the chances of another bout of acute bronchitis by washing your hands frequently, avoiding people with cold symptoms, and trying not to touch your hands to your mouth, nose, or eyes.   Follow up with your health care provider as directed.  SEEK MEDICAL CARE IF: Your symptoms do not improve after 1 week of treatment.  SEEK IMMEDIATE MEDICAL CARE IF:  You develop an increased fever or chills.   You have chest pain.   You have severe shortness of breath.  You have bloody sputum.   You develop dehydration.  You develop fainting.  You develop repeated vomiting.  You develop a severe headache. MAKE SURE YOU:   Understand these instructions.  Will watch your condition.  Will get help right away if you are not doing well or get worse. Document Released: 04/03/2004 Document Revised: 10/27/2012 Document Reviewed: 08/17/2012 [employer] Patient Information 2014 [employer].  

## 2013-04-21 NOTE — Progress Notes (Signed)
Subjective:    Patient ID: Lance Ramirez, male    DOB: [birth year], 80 y.o.   MRN: [id number]  Cough This is a recurrent problem. The current episode started in the past 7 days. The problem has been waxing and waning. The problem occurs constantly. The cough is productive of purulent sputum. Associated symptoms include chills, a fever, headaches and sweats. Pertinent negatives include no chest pain, ear congestion, ear pain, heartburn, hemoptysis, myalgias, nasal congestion, postnasal drip, rash, rhinorrhea, sore throat, shortness of breath, weight loss or wheezing. His past medical history is significant for bronchitis.     Patient was hospitalized 7 Jan thru 11 Apr 2012 with an AECB and d/c'd home on Levaquin x 2 doses. He has been home now 10 days and persists with  Productive cough andintermittent fever - was 100.1 deg last night and is 99.0 deg today.   Medication List       amphetamine-dextroamphetamine 20 MG tablet  Commonly known as:  ADDERALL  Take 1/2 to 1 tablet up to twice daily as needed for alertness and depression     aspirin EC 81 MG tablet  Take 81 mg by mouth daily.     atenolol 100 MG tablet  Commonly known as:  TENORMIN  Take 100 mg by mouth daily.     benzonatate 200 MG capsule  Commonly known as:  TESSALON  Take 1 capsule (200 mg total) by mouth 3 (three) times daily as needed for cough.     clopidogrel 75 MG tablet  Commonly known as:  PLAVIX  Take 75 mg by mouth daily.     dextromethorphan-guaiFENesin 30-600 MG per 12 hr tablet  Commonly known as:  MUCINEX DM  Take 1 tablet by mouth 2 (two) times daily.     diphenhydramine-acetaminophen 25-500 MG Tabs  Commonly known as:  TYLENOL PM  Take 1 tablet by mouth at bedtime as needed.     doxazosin 4 MG tablet  Commonly known as:  CARDURA  Take 4 mg by mouth daily.     doxycycline 100 MG capsule  Commonly known as:  VIBRAMYCIN  Take 1 capsule (100 mg total) by mouth 2 (two) times daily. For 1 week , Then  take 1 capsule daily for 1 week -TAKE WITH FOOD ON A FULL STOMACH     Fish Oil 1000 MG Cpdr  Take 1 capsule by mouth daily.     gemfibrozil 600 MG tablet  Commonly known as:  LOPID  Take 600 mg by mouth 2 (two) times daily before a meal.     HYDROcodone-acetaminophen 5-325 MG per tablet  Commonly known as:  NORCO/VICODIN     losartan 100 MG tablet  Commonly known as:  COZAAR     Magnesium 300 MG Caps  Take 1 capsule by mouth daily.     minoxidil 10 MG tablet  Commonly known as:  LONITEN  Take 10 mg by mouth at bedtime.     omeprazole 40 MG capsule  Commonly known as:  PRILOSEC     QUEtiapine 50 MG tablet  Commonly known as:  SEROQUEL  Take 50 mg by mouth at bedtime.     sildenafil 100 MG tablet  Commonly known as:  VIAGRA  Take 1 tablet (100 mg total) by mouth as needed for erectile dysfunction.     testosterone cypionate 100 MG/ML injection  Commonly known as:  DEPOTESTOTERONE CYPIONATE  Inject 200 mg into the muscle every 14 (fourteen) days. For IM use only  Vitamin D-3 5000 UNITS Tabs  Take 1 tablet by mouth 2 (two) times daily.       No Known Allergies Past Medical History  Diagnosis Date  . TIA (transient ischemic attack)   . Confusion   . H/O dizziness   . Arthritis   . GERD (gastroesophageal reflux disease)   . Hypertension   . Hyperlipidemia   . 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 Vitamin D deficiency   . Other testicular hypofunction        Review of Systems  Constitutional: Positive for fever and chills. Negative for weight loss, diaphoresis and fatigue.  HENT: Negative for ear pain, postnasal drip, rhinorrhea, sinus pressure and sore throat.   Eyes: Negative.   Respiratory: Positive for cough. Negative for hemoptysis, shortness of breath and wheezing.   Cardiovascular: Negative.  Negative for chest pain.  Gastrointestinal: Negative.  Negative for heartburn.  Endocrine: Negative.    Genitourinary: Negative.   Musculoskeletal: Negative for myalgias.  Skin: Positive for pallor. Negative for rash and wound.  Allergic/Immunologic: Negative.   Neurological: Positive for headaches.    Objective:   Physical Exam  HENT:  Head: Normocephalic.  Neck: Normal range of motion. Neck supple. No JVD present. No thyromegaly present.  Cardiovascular: Normal rate, regular rhythm, normal heart sounds and intact distal pulses.  Exam reveals no gallop.   No murmur heard. Pulmonary/Chest: Effort normal. No stridor. No respiratory distress. He has no wheezes. He has rales.  Abdominal: Soft. Bowel sounds are normal.  Lymphadenopathy:    He has no cervical adenopathy.  Neurological: He is alert.  Poor short term recall - depending on wife to supply details of med's and Sx's.  Skin: No rash noted. He is not diaphoretic. No erythema. No pallor.   Assessment & Plan:   1. Acute tracheitis without mention of obstruction Rx Doxycycline #21 - 1 cap bid x 1 wk, the qd x 1 wk  2. Unspecified essential hypertension   3. Encounter for long-term (current) use of other medications  - CBC with Differential - BASIC METABOLIC PANEL WITH GFR

## 2013-04-22 LAB — CBC WITH DIFFERENTIAL/PLATELET
Basophils Absolute: 0 10*3/uL (ref 0.0–0.1)
Basophils Relative: 1 % (ref 0–1)
Eosinophils Absolute: 0.1 10*3/uL (ref 0.0–0.7)
Eosinophils Relative: 2 % (ref 0–5)
HCT: 41.4 % (ref 39.0–52.0)
HEMOGLOBIN: 13.9 g/dL (ref 13.0–17.0)
Lymphocytes Relative: 29 % (ref 12–46)
Lymphs Abs: 1.4 10*3/uL (ref 0.7–4.0)
MCH: 28.4 pg (ref 26.0–34.0)
MCHC: 33.6 g/dL (ref 30.0–36.0)
MCV: 84.7 fL (ref 78.0–100.0)
MONOS PCT: 11 % (ref 3–12)
Monocytes Absolute: 0.5 10*3/uL (ref 0.1–1.0)
NEUTROS ABS: 2.7 10*3/uL (ref 1.7–7.7)
NEUTROS PCT: 57 % (ref 43–77)
Platelets: 214 10*3/uL (ref 150–400)
RBC: 4.89 MIL/uL (ref 4.22–5.81)
RDW: 15.6 % — ABNORMAL HIGH (ref 11.5–15.5)
WBC: 4.7 10*3/uL (ref 4.0–10.5)

## 2013-04-22 LAB — BASIC METABOLIC PANEL WITH GFR
BUN: 16 mg/dL (ref 6–23)
CO2: 29 meq/L (ref 19–32)
Calcium: 8.9 mg/dL (ref 8.4–10.5)
Chloride: 106 mEq/L (ref 96–112)
Creat: 1.16 mg/dL (ref 0.50–1.35)
GFR, Est African American: 70 mL/min
GFR, Est Non African American: 60 mL/min
GLUCOSE: 105 mg/dL — AB (ref 70–99)
POTASSIUM: 4.3 meq/L (ref 3.5–5.3)
Sodium: 140 mEq/L (ref 135–145)

## 2013-04-29 ENCOUNTER — Other Ambulatory Visit: Payer: Self-pay | Admitting: Physician Assistant

## 2013-04-29 MED ORDER — MINOXIDIL 10 MG PO TABS: 10.0000 mg | ORAL_TABLET | Freq: Every day | ORAL | Status: DC

## 2013-05-17 ENCOUNTER — Encounter: Payer: Self-pay | Admitting: Internal Medicine

## 2013-05-17 NOTE — Progress Notes (Signed)
Patient ID: Lance Ramirez, male   DOB: April 29, 1933, 80 y.o.   MRN: [id number] error

## 2013-05-31 ENCOUNTER — Encounter: Payer: Self-pay | Admitting: Internal Medicine

## 2013-05-31 ENCOUNTER — Ambulatory Visit (INDEPENDENT_AMBULATORY_CARE_PROVIDER_SITE_OTHER): Payer: Medicare Other | Admitting: Internal Medicine

## 2013-05-31 VITALS — BP 126/78 | HR 76 | Temp 98.3°F | Resp 16 | Ht 69.75 in | Wt 183.4 lb

## 2013-05-31 DIAGNOSIS — R7303 Prediabetes: Secondary | ICD-10-CM | POA: Insufficient documentation

## 2013-05-31 DIAGNOSIS — E785 Hyperlipidemia, unspecified: Secondary | ICD-10-CM

## 2013-05-31 DIAGNOSIS — Z79899 Other long term (current) drug therapy: Secondary | ICD-10-CM

## 2013-05-31 DIAGNOSIS — E559 Vitamin D deficiency, unspecified: Secondary | ICD-10-CM

## 2013-05-31 DIAGNOSIS — F329 Major depressive disorder, single episode, unspecified: Secondary | ICD-10-CM

## 2013-05-31 DIAGNOSIS — F32A Depression, unspecified: Secondary | ICD-10-CM

## 2013-05-31 DIAGNOSIS — I10 Essential (primary) hypertension: Secondary | ICD-10-CM

## 2013-05-31 DIAGNOSIS — R7309 Other abnormal glucose: Secondary | ICD-10-CM

## 2013-05-31 LAB — CBC WITH DIFFERENTIAL/PLATELET
Basophils Absolute: 0 10*3/uL (ref 0.0–0.1)
Basophils Relative: 0 % (ref 0–1)
EOS ABS: 0.1 10*3/uL (ref 0.0–0.7)
EOS PCT: 1 % (ref 0–5)
HCT: 42 % (ref 39.0–52.0)
HEMOGLOBIN: 14.5 g/dL (ref 13.0–17.0)
LYMPHS ABS: 1.6 10*3/uL (ref 0.7–4.0)
Lymphocytes Relative: 32 % (ref 12–46)
MCH: 28.4 pg (ref 26.0–34.0)
MCHC: 34.5 g/dL (ref 30.0–36.0)
MCV: 82.2 fL (ref 78.0–100.0)
MONOS PCT: 5 % (ref 3–12)
Monocytes Absolute: 0.3 10*3/uL (ref 0.1–1.0)
Neutro Abs: 3.1 10*3/uL (ref 1.7–7.7)
Neutrophils Relative %: 62 % (ref 43–77)
Platelets: 145 10*3/uL — ABNORMAL LOW (ref 150–400)
RBC: 5.11 MIL/uL (ref 4.22–5.81)
RDW: 15 % (ref 11.5–15.5)
WBC: 5 10*3/uL (ref 4.0–10.5)

## 2013-05-31 MED ORDER — AMPHETAMINE-DEXTROAMPHETAMINE 20 MG PO TABS: ORAL_TABLET | ORAL | Status: DC

## 2013-05-31 NOTE — Patient Instructions (Signed)

## 2013-05-31 NOTE — Progress Notes (Signed)
Patient ID: Lance Ramirez, male   DOB: 1933-04-30, 80 y.o.   MRN: [id number]    This very nice 80 y.o. RH MWM presents for 3 month follow up with Hypertension, Hyperlipidemia, Pre-Diabetes and Vitamin D Deficiency.    HTN predates since 76. Patient has no known heart disease, but did have a CVA in Dec 2012 which was treated with TPA. He had a 2sd CVA in Mar 2014 and has persisted with paresthesias of the RUE now for a year and recently his wife took him to a physical therapist who the wife understood him to imply that physical therapy would help resolve his paresthesias. Patient any motor difficulties of the RUE or RUE. Today's BP: 126/78 mmHg . Patient denies any cardiac type chest pain, palpitations, dyspnea/orthopnea/PND, dizziness, claudication, or dependent edema.   Hyperlipidemia is notcontrolled with diet & meds (note that patient is statin intolerant). Last Cholesterol was 213, Triglycerides were 49, HDL 60 and LDL 143 in Dec - not at goal. Patient denies myalgias or other med SE's.    Also, the patient has history of PreDiabetes with A1c 5.8% since Dec 2011 and with last A1c of 5.9% in Dec 2014. Patient denies any symptoms of reactive hypoglycemia, diabetic polys, paresthesias or visual blurring.   Further, Patient has history of Vitamin D Deficiency of 26 in 2008 with last vitamin D of 71. Patient supplements vitamin D without any suspected side-effects.  Medication Sig  . aspirin EC 81 MG tablet Take 81 mg by mouth daily.  Marland Kitchen atenolol (TENORMIN) 100 MG tablet Take 100 mg by mouth daily.    . Cholecalciferol (VITAMIN D-3) 5000  Take 1 tablet by mouth 2 (two) times daily.  . clopidogrel (PLAVIX) 75 MG tablet Take 75 mg by mouth daily.  . (MUCINEX DM) 30-600 MG per 12 hr tablet Take 1 tablet by mouth 2 (two) times daily.  .  TYLENOL PM 25-500 MG TABS Take 1 tablet by mouth at bedtime as needed.  . doxazosin (CARDURA) 4 MG tablet Take 4 mg by mouth daily.  Marland Kitchen gemfibrozil (LOPID) 600 MG  tablet Take 600 mg by mouth 2 (two) times daily before a meal.  . losartan (COZAAR) 100 MG tablet   . Magnesium 300 MG CAPS Take 1 capsule by mouth daily.  . minoxidil (LONITEN) 10 MG tablet Take 1 tablet (10 mg total) by mouth at bedtime.  . Omega-3 Fatty Acids (FISH OIL) 1000 MG  Take 1 capsule by mouth daily.  Marland Kitchen omeprazole (PRILOSEC) 40 MG capsule   . QUEtiapine (SEROQUEL) 50 MG tablet Take 50 mg by mouth at bedtime.  . sildenafil (VIAGRA) 100 MG tablet Take 1 tablet (100 mg total) by mouth   .  DEPOTESTOTERONE 100 MG/ML Inject 200 mg into the muscle every 14 (fourteen) days    No Known Allergies  PMHx:   Past Medical History  Diagnosis Date  . TIA (transient ischemic attack)   . Confusion   . H/O dizziness   . Arthritis   . GERD (gastroesophageal reflux disease)   . Hypertension   . Hyperlipidemia   . 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 Vitamin D deficiency   . Other testicular hypofunction     FHx:    Reviewed / unchanged  SHx:    Reviewed / unchanged   Systems Review: Constitutional: Denies fever, chills, wt changes, headaches, insomnia, fatigue, night sweats, change in appetite. Eyes: Denies redness, blurred  vision, diplopia, discharge, itchy, watery eyes.  ENT: Denies discharge, congestion, post nasal drip, epistaxis, sore throat, earache, hearing loss, dental pain, tinnitus, vertigo, sinus pain, snoring.  CV: Denies chest pain, palpitations, irregular heartbeat, syncope, dyspnea, diaphoresis, orthopnea, PND, claudication, edema. Respiratory: denies cough, dyspnea, DOE, pleurisy, hoarseness, laryngitis, wheezing.  Gastrointestinal: Denies dysphagia, odynophagia, heartburn, reflux, water brash, abdominal pain or cramps, nausea, vomiting, bloating, diarrhea, constipation, hematemesis, melena, hematochezia,  or hemorrhoids. Genitourinary: Denies dysuria, frequency, urgency, nocturia, hesitancy, discharge, hematuria,  flank pain. Musculoskeletal: Denies arthralgias, myalgias, stiffness, jt. swelling, pain, limp, strain/sprain.  Skin: Denies pruritus, rash, hives, warts, acne, eczema, change in skin lesion(s). Neuro: No weakness, tremor, incoordination, spasms, paresthesia, or pain. Psychiatric: Denies confusion, memory loss, or sensory loss. Endo: Denies change in weight, skin, hair change.  Heme/Lymph: No excessive bleeding, bruising, orenlarged lymph nodes.   Exam:  BP 126/78  Pulse 76  Temp 98.3 F   Resp 16  Ht 5' 9.75"   Wt 183 lb 6.4 oz   BMI 26.49 kg/m2  Appears well nourished - in no distress. Eyes: PERRLA, EOMs, conjunctiva no swelling or erythema. Sinuses: No frontal/maxillary tenderness ENT/Mouth: EAC's clear, TM's nl w/o erythema, bulging. Nares clear w/o erythema, swelling, exudates. Oropharynx clear without erythema or exudates. Oral hygiene is good. Tongue normal, non obstructing. Hearing intact.  Neck: Supple. Thyroid nl. Car 2+/2+ without bruits, nodes or JVD. Chest: Respirations nl with BS clear & equal w/o rales, rhonchi, wheezing or stridor.  Cor: Heart sounds normal w/ regular rate and rhythm without sig. murmurs, gallops, clicks, or rubs. Peripheral pulses normal and equal  without edema.  Abdomen: Soft & bowel sounds normal. Non-tender w/o guarding, rebound, hernias, masses, or organomegaly.  Lymphatics: Unremarkable.  Musculoskeletal: Full ROM all peripheral extremities, joint stability, 5/5 strength, and normal gait.  Skin: Warm, dry without exposed rashes, lesions, ecchymosis apparent.  Neuro: Cranial nerves intact, reflexes equal bilaterally. Sensory-motor testing grossly intact. Tendon reflexes grossly intact. No focal neuro signs. Pysch: Alert & oriented x 3. Insight and judgement nl & appropriate. No ideations. Patient seems to have decreased ST memory recall and an attitude of indifference.  Assessment and Plan:  1. Hypertension - Continue monitor blood pressure at  home. Continue diet/meds same.  2. Hyperlipidemia - Continue diet/meds, exercise,& lifestyle modifications. Continue monitor periodic cholesterol/liver & renal functions   3. Pre-diabetes/Insulin Resistance - Continue diet, exercise, lifestyle modifications. Monitor appropriate labs.  4. Vitamin D Deficiency - Continue supplementation.  5. ASCVD w/ CVA x 2 -   Recommended regular exercise, BP monitoring, weight control, and discussed med and SE's. Recommended labs to assess and monitor clinical status. Further disposition pending results of labs. I explained to the patient's wife that I did not expect physical therapy to help recover the paresthesias of his LUE and that therefore could not certify treatments .

## 2013-06-01 LAB — BASIC METABOLIC PANEL WITH GFR
BUN: 27 mg/dL — ABNORMAL HIGH (ref 6–23)
CALCIUM: 9.6 mg/dL (ref 8.4–10.5)
CO2: 24 mEq/L (ref 19–32)
Chloride: 106 mEq/L (ref 96–112)
Creat: 1.25 mg/dL (ref 0.50–1.35)
GFR, Est African American: 64 mL/min
GFR, Est Non African American: 55 mL/min — ABNORMAL LOW
GLUCOSE: 103 mg/dL — AB (ref 70–99)
Potassium: 4.9 mEq/L (ref 3.5–5.3)
SODIUM: 142 meq/L (ref 135–145)

## 2013-06-01 LAB — LIPID PANEL
Cholesterol: 203 mg/dL — ABNORMAL HIGH (ref 0–200)
HDL: 46 mg/dL (ref >39)
LDL Cholesterol: 143 mg/dL — ABNORMAL HIGH (ref 0–99)
Total CHOL/HDL Ratio: 4.4 Ratio
Triglycerides: 70 mg/dL (ref <150)
VLDL: 14 mg/dL (ref 0–40)

## 2013-06-01 LAB — MAGNESIUM: MAGNESIUM: 2.2 mg/dL (ref 1.5–2.5)

## 2013-06-01 LAB — INSULIN, FASTING: Insulin fasting, serum: 5 u[IU]/mL (ref 3–28)

## 2013-06-01 LAB — VITAMIN D 25 HYDROXY (VIT D DEFICIENCY, FRACTURES): VIT D 25 HYDROXY: 78 ng/mL (ref 30–89)

## 2013-06-01 LAB — HEPATIC FUNCTION PANEL
ALT: 10 U/L (ref 0–53)
AST: 14 U/L (ref 0–37)
Albumin: 4.2 g/dL (ref 3.5–5.2)
Alkaline Phosphatase: 50 U/L (ref 39–117)
Bilirubin, Direct: 0.1 mg/dL (ref 0.0–0.3)
Indirect Bilirubin: 0.4 mg/dL (ref 0.2–1.2)
Total Bilirubin: 0.5 mg/dL (ref 0.2–1.2)
Total Protein: 6.2 g/dL (ref 6.0–8.3)

## 2013-06-01 LAB — TSH: TSH: 1.845 u[IU]/mL (ref 0.350–4.500)

## 2013-06-01 LAB — HEMOGLOBIN A1C
HEMOGLOBIN A1C: 6 % — AB (ref <5.7)
Mean Plasma Glucose: 126 mg/dL — ABNORMAL HIGH (ref <117)

## 2013-06-21 ENCOUNTER — Other Ambulatory Visit: Payer: Self-pay | Admitting: *Deleted

## 2013-06-21 MED ORDER — DOXAZOSIN MESYLATE 4 MG PO TABS: 4.0000 mg | ORAL_TABLET | Freq: Every day | ORAL | Status: DC

## 2013-06-24 ENCOUNTER — Encounter: Payer: Self-pay | Admitting: Internal Medicine

## 2013-08-08 ENCOUNTER — Other Ambulatory Visit: Payer: Self-pay

## 2013-08-08 ENCOUNTER — Other Ambulatory Visit: Payer: Self-pay | Admitting: Internal Medicine

## 2013-08-08 DIAGNOSIS — F32A Depression, unspecified: Secondary | ICD-10-CM

## 2013-08-08 DIAGNOSIS — F329 Major depressive disorder, single episode, unspecified: Secondary | ICD-10-CM

## 2013-08-08 MED ORDER — AMPHETAMINE-DEXTROAMPHETAMINE 20 MG PO TABS: ORAL_TABLET | ORAL | Status: DC

## 2013-08-08 MED ORDER — ATENOLOL 100 MG PO TABS: 100.0000 mg | ORAL_TABLET | Freq: Every day | ORAL | Status: DC

## 2013-08-09 ENCOUNTER — Ambulatory Visit (INDEPENDENT_AMBULATORY_CARE_PROVIDER_SITE_OTHER): Payer: Medicare Other | Admitting: Physician Assistant

## 2013-08-09 ENCOUNTER — Encounter: Payer: Self-pay | Admitting: Physician Assistant

## 2013-08-09 VITALS — BP 110/64 | HR 72 | Temp 98.6°F | Resp 16 | Wt 186.0 lb

## 2013-08-09 DIAGNOSIS — R55 Syncope and collapse: Secondary | ICD-10-CM

## 2013-08-09 DIAGNOSIS — N3000 Acute cystitis without hematuria: Secondary | ICD-10-CM

## 2013-08-09 DIAGNOSIS — Z79899 Other long term (current) drug therapy: Secondary | ICD-10-CM

## 2013-08-09 NOTE — Patient Instructions (Signed)
Syncope  Syncope is a fainting spell. This means the person loses consciousness and drops to the ground. The person is generally unconscious for less than 5 minutes. The person may have some muscle twitches for up to 15 seconds before waking up and returning to normal. Syncope occurs more often in elderly people, but it can happen to anyone. While most causes of syncope are not dangerous, syncope can be a sign of a serious medical problem. It is important to seek medical care.   CAUSES   Syncope is caused by a sudden decrease in blood flow to the brain. The specific cause is often not determined. Factors that can trigger syncope include:   Taking medicines that lower blood pressure.   Sudden changes in posture, such as standing up suddenly.   Taking more medicine than prescribed.   Standing in one place for too long.   Seizure disorders.   Dehydration and excessive exposure to heat.   Low blood sugar (hypoglycemia).   Straining to have a bowel movement.   Heart disease, irregular heartbeat, or other circulatory problems.   Fear, emotional distress, seeing blood, or severe pain.  SYMPTOMS   Right before fainting, you may:   Feel dizzy or lightheaded.   Feel nauseous.   See all white or all black in your field of vision.   Have cold, clammy skin.  DIAGNOSIS   Your caregiver will ask about your symptoms, perform a physical exam, and perform electrocardiography (ECG) to record the electrical activity of your heart. Your caregiver may also perform other heart or blood tests to determine the cause of your syncope.  TREATMENT   In most cases, no treatment is needed. Depending on the cause of your syncope, your caregiver may recommend changing or stopping some of your medicines.  HOME CARE INSTRUCTIONS   Have someone stay with you until you feel stable.   Do not drive, operate machinery, or play sports until your caregiver says it is okay.   Keep all follow-up appointments as directed by your  caregiver.   Lie down right away if you start feeling like you might faint. Breathe deeply and steadily. Wait until all the symptoms have passed.   Drink enough fluids to keep your urine clear or pale yellow.   If you are taking blood pressure or heart medicine, get up slowly, taking several minutes to sit and then stand. This can reduce dizziness.  SEEK IMMEDIATE MEDICAL CARE IF:    You have a severe headache.   You have unusual pain in the chest, abdomen, or back.   You are bleeding from the mouth or rectum, or you have black or tarry stool.   You have an irregular or very fast heartbeat.   You have pain with breathing.   You have repeated fainting or seizure-like jerking during an episode.   You faint when sitting or lying down.   You have confusion.   You have difficulty walking.   You have severe weakness.   You have vision problems.  If you fainted, call your local emergency services (911 in [place] Do not drive yourself to the hospital.   MAKE SURE YOU:   Understand these instructions.   Will watch your condition.   Will get help right away if you are not doing well or get worse.  Document Released: 02/24/2005 Document Revised: 08/26/2011 Document Reviewed: 04/25/2011  [employer] Patient Information 2014 [employer]

## 2013-08-09 NOTE — Progress Notes (Signed)
Subjective:    Patient ID: Lance Ramirez, male    DOB: [birth year], 80 y.o.   MRN: [id number]  Loss of Consciousness This is a new problem. Episode onset: Saturday. Length of episode of loss of consciousness: unknown. Associated symptoms include dizziness. Pertinent negatives include no abdominal pain, auditory change, aura, back pain, bladder incontinence, bowel incontinence, chest pain, clumsiness, confusion, diaphoresis, fever, focal sensory loss, focal weakness, headaches, light-headedness, malaise/fatigue, nausea, palpitations, slurred speech, vertigo, visual change, vomiting or weakness. His past medical history is significant for CVA, DM, HTN and TIA.  80 y.o. former smoking male with extensive history for CVA, HTN, chol, predm presents with syncope on saturday, wife is presents. She states that she heard a loud noise and then after a few minutes he came out of the bathroom stating that he fell.  He states he got up that morning, he was washing, unknown if he was leaning over to do so but then he woke up on the floor. He fell hard enough to leave a hole in the wall, unknown if from his elbow or head. He states his neck is sore. He was off his fluid pill but he has been on it for the last few months.   Wife states he has dizziness, was intermittent but now it is constant, worse with movement since his last stroke. He has a poor memory and is unable to recall any CP, SOB, weakness, palpitations.   Results for orders placed during the hospital encounter of 05/14/12  MR BRAIN WO CONTRAST   Narrative:    *RADIOLOGY REPORT*  Clinical Data:  Mental status change.  Left arm numbness  MRI HEAD WITHOUT CONTRAST MRA HEAD WITHOUT CONTRAST  Technique:  Multiplanar, multiecho pulse sequences of the brain and surrounding structures were obtained without intravenous contrast. Angiographic images of the head were obtained using MRA technique without contrast.  Comparison:  CT 05/14/2012  MRI  HEAD  Findings:  Acute infarct in the right posterior cerebral artery territory.  Acute infarct in the right hippocampus, right occipital white matter, and a small area of acute infarct in the right thalamus.  Generalized atrophy.  Chronic microvascular ischemic changes in the cerebral white matter.  Chronic lacunar infarction left thalamus. Brainstem and cerebellum are intact.  Negative for hemorrhage or mass.  Ventricle size is not enlarged and there is no midline shift.  IMPRESSION: Acute infarct right PCA territory  MRA HEAD  Findings: Both vertebral arteries are patent to the basilar.  The basilar is patent.  Superior cerebellar arteries are patent bilaterally.  There is occlusion of the right posterior cerebral artery at the origin.  There is a high-grade stenosis of the proximal left PCA which then occludes.  This is unchanged from 02/11/2011.  Atherosclerotic irregularity in the cavernous carotid bilaterally without significant stenosis.  Atherosclerotic disease is present in the M1 segment bilaterally without flow limiting stenosis. Middle cerebral artery branches are patent bilaterally.  Both anterior cerebral arteries are patent.  Negative for aneurysm.  IMPRESSION: Occlusion of the right posterior cerebral artery at the origin compatible with acute infarct.  High-grade stenosis followed by occlusion of the left posterior cerebral artery, unchanged from 2012.  Mild atherosclerotic disease in the cavernous carotid and M1 segments bilaterally.   Original Report Authenticated By: Carl Best, M.D.   CT HEAD W/O CONTRAST   Narrative:    *RADIOLOGY REPORT*  Clinical Data: Left-sided numbness, history of TIA/stroke  CT HEAD WITHOUT CONTRAST  Technique:  Contiguous axial images  were obtained from the base of the skull through the vertex without contrast.  Comparison: MRI brain dated 02/11/2011  Findings: No evidence of parenchymal hemorrhage or  extra-axial fluid collection. No mass lesion, mass effect, or midline shift.  No CT evidence of acute infarction.  Old left thalamic lacunar infarct.  Subcortical white matter and periventricular small vessel ischemic changes.  Intracranial atherosclerosis.  Mild global cortical atrophy.  No ventriculomegaly.  The visualized paranasal sinuses are essentially clear. The mastoid air cells are unopacified.  No evidence of calvarial fracture.  IMPRESSION: No evidence of acute intracranial abnormality.  Old left thalamic lacunar infarct.  Atrophy with small vessel ischemic changes and intracranial atherosclerosis.   Original Report Authenticated By: Julian Hy, M.D.   Results for orders placed during the hospital encounter of 02/10/11  MR BRAIN W WO CONTRAST   Narrative:    *RADIOLOGY REPORT*  Clinical Data:    Status post t-PA.  Crescendo TIAs with right- sided numbness and weakness.  MRI HEAD WITHOUT AND WITH CONTRAST MRA HEAD WITHOUT CONTRAST MRA NECK WITHOUT AND WITH CONTRAST  Technique: Multiplanar, multiecho pulse sequences of the brain and surrounding structures were obtained according to standard protocol without and with intravenous contrast.  Angiographic images of the Circle of Willis were obtained using MRA technique without intravenous contrast.  Angiographic images of the neck were obtained using MRA technique without and with intravenous contrast.  Contrast: MultiHance 10 ml.  Comparison: CT head 02/11/2011.  CT head 02/10/2011.  MRI HEAD WITHOUT AND WITH CONTRAST  Findings:  An approximately 1 x 2 cm area of acute infarction affects the left thalamus and posterior limb internal capsule consistent with the patient's deficit.  There is no visible hemorrhage based on gradient recalled echo sequence.  There is no midline shift.  There is moderate atrophy with chronic microvascular ischemic change.  The pituitary, pineal, and cerebellar tonsils  are unremarkable. The major intracranial vessels structures are widely patent based on flow void.  There is no sinus or mastoid disease.  IMPRESSION: 1 x 2 cm area of acute infarction affects the left thalamus and posterior limb internal capsule on the left.  No visible hemorrhage.  MRA HEAD WITHOUT CONTRAST  Findings:  The skull base, cavernous, and supraclinoid internal carotid arteries are patent with only minimal irregularity in the supraclinoid regions.  The basilar artery demonstrates a 75% stenosis proximally.  Both vertebrals supply the basilar with a 75% stenosis of the right vertebral near the foramen magnum and a second 50% stenosis at its confluence with the basilar.  There is mild nonstenotic irregularity of  the left vertebral.  There is complete occlusion of the left posterior cerebral artery in its mid P1 segment.  Distal to this, there is no flow related enhancement.  There are tandem 50% stenoses of the P1 and P2 segment of the right posterior cerebral artery.  There is a 75% stenosis at the origin of the left middle cerebral artery.  There is a 50% stenosis at the origin of the right middle cerebral artery.  There is mild irregularity of both anterior cerebral arteries proximally.  IMPRESSION: Complete occlusion of the left posterior cerebral artery. Severe multifocal disease elsewhere as described.   MRA NECK WITHOUT AND WITH CONTRAST  Findings:  Suboptimal bolus timing and patient swallowing results in somewhat limited diagnostic utility.  There is a bovine trunk.  No proximal great vessel stenosis is observed.  Bilateral carotid artery bifurcations are widely patent.   No evidence  for carotid dissection, no significant post wall plaque, or flow reducing stenosis.  Bilateral vertebral arteries are patent through the neck without extracranial stenosis.  No definite ostial lesion.  IMPRESSION: Unremarkable MR angiography of the extracranial  circulation.  Original Report Authenticated By: Staci Righter, M.D.    Current Outpatient Prescriptions on File Prior to Visit  Medication Sig Dispense Refill  . amphetamine-dextroamphetamine (ADDERALL) 20 MG tablet Take 1/2 to 1 tablet up to twice daily as needed for alertness and depression  60 tablet  0  . aspirin EC 81 MG tablet Take 81 mg by mouth daily.      Marland Kitchen atenolol (TENORMIN) 100 MG tablet Take 1 tablet (100 mg total) by mouth daily.  90 tablet  3  . Cholecalciferol (VITAMIN D-3) 5000 UNITS TABS Take 1 tablet by mouth 2 (two) times daily.      . clopidogrel (PLAVIX) 75 MG tablet Take 75 mg by mouth daily.      Marland Kitchen dextromethorphan-guaiFENesin (MUCINEX DM) 30-600 MG per 12 hr tablet Take 1 tablet by mouth 2 (two) times daily.  15 tablet  0  . diphenhydramine-acetaminophen (TYLENOL PM) 25-500 MG TABS Take 1 tablet by mouth at bedtime as needed.      . doxazosin (CARDURA) 4 MG tablet Take 1 tablet (4 mg total) by mouth daily.  90 tablet  3  . gemfibrozil (LOPID) 600 MG tablet Take 600 mg by mouth 2 (two) times daily before a meal.      . losartan (COZAAR) 100 MG tablet       . Magnesium 300 MG CAPS Take 1 capsule by mouth daily.      . minoxidil (LONITEN) 10 MG tablet Take 1 tablet (10 mg total) by mouth at bedtime.  90 tablet  1  . Omega-3 Fatty Acids (FISH OIL) 1000 MG CPDR Take 1 capsule by mouth daily.      Marland Kitchen omeprazole (PRILOSEC) 40 MG capsule       . QUEtiapine (SEROQUEL) 50 MG tablet Take 50 mg by mouth at bedtime.      . sildenafil (VIAGRA) 100 MG tablet Take 1 tablet (100 mg total) by mouth as needed for erectile dysfunction.  10 tablet  11  . testosterone cypionate (DEPOTESTOTERONE CYPIONATE) 100 MG/ML injection Inject 200 mg into the muscle every 14 (fourteen) days. For IM use only       No current facility-administered medications on file prior to visit.     Review of Systems  Constitutional: Negative for fever, malaise/fatigue and diaphoresis.  Cardiovascular:  Positive for syncope. Negative for chest pain and palpitations.  Gastrointestinal: Negative for nausea, vomiting, abdominal pain and bowel incontinence.  Genitourinary: Negative for bladder incontinence.  Musculoskeletal: Negative for back pain.  Neurological: Positive for dizziness. Negative for vertigo, focal weakness, weakness, light-headedness and headaches.  Psychiatric/Behavioral: Negative for confusion.       Objective:   Physical Exam  Constitutional: He appears well-developed and well-nourished.  HENT:  Head: Normocephalic and atraumatic.  Right Ear: External ear normal.  Left Ear: External ear normal.  Eyes: Conjunctivae and EOM are normal. Pupils are equal, round, and reactive to light.  Neck: Normal range of motion. Neck supple.  Cardiovascular: Normal rate and regular rhythm.   Murmur (2/6 systolic murmur) heard. Pulmonary/Chest: Effort normal and breath sounds normal.  Abdominal: Soft. Bowel sounds are normal. There is no tenderness.  Musculoskeletal: Normal range of motion.  Neurological: He is alert. He has normal strength. No cranial nerve deficit  or sensory deficit. He displays a negative Romberg sign. Coordination and gait normal.  Skin: Skin is warm and dry. No rash noted.        Assessment & Plan:  Loss of consciousness of uncertain cause, Loss of consciousness, suspect cardiac arrhythmia and possible also due to vasovagal/orthostatic ECG. 24 hour Holter monitor. History worrisome for CVA. Lab per orders. MRA of posterior circulation rule out vertebrobasilar insufficiency. Cut minoxidil in 1/2

## 2013-08-10 LAB — URINALYSIS, ROUTINE W REFLEX MICROSCOPIC
Bilirubin Urine: NEGATIVE
GLUCOSE, UA: NEGATIVE mg/dL
HGB URINE DIPSTICK: NEGATIVE
KETONES UR: NEGATIVE mg/dL
NITRITE: NEGATIVE
PROTEIN: NEGATIVE mg/dL
Specific Gravity, Urine: 1.017 (ref 1.005–1.030)
Urobilinogen, UA: 0.2 mg/dL (ref 0.0–1.0)
pH: 5.5 (ref 5.0–8.0)

## 2013-08-10 LAB — HEPATIC FUNCTION PANEL
ALBUMIN: 4.1 g/dL (ref 3.5–5.2)
ALT: 11 U/L (ref 0–53)
AST: 14 U/L (ref 0–37)
Alkaline Phosphatase: 46 U/L (ref 39–117)
Bilirubin, Direct: 0.1 mg/dL (ref 0.0–0.3)
Indirect Bilirubin: 0.4 mg/dL (ref 0.2–1.2)
TOTAL PROTEIN: 6.2 g/dL (ref 6.0–8.3)
Total Bilirubin: 0.5 mg/dL (ref 0.2–1.2)

## 2013-08-10 LAB — CBC WITH DIFFERENTIAL/PLATELET
BASOS ABS: 0 10*3/uL (ref 0.0–0.1)
BASOS PCT: 1 % (ref 0–1)
EOS ABS: 0.1 10*3/uL (ref 0.0–0.7)
EOS PCT: 3 % (ref 0–5)
HEMATOCRIT: 39.6 % (ref 39.0–52.0)
Hemoglobin: 13.7 g/dL (ref 13.0–17.0)
Lymphocytes Relative: 34 % (ref 12–46)
Lymphs Abs: 1.4 10*3/uL (ref 0.7–4.0)
MCH: 27.7 pg (ref 26.0–34.0)
MCHC: 34.6 g/dL (ref 30.0–36.0)
MCV: 80 fL (ref 78.0–100.0)
MONO ABS: 0.4 10*3/uL (ref 0.1–1.0)
Monocytes Relative: 10 % (ref 3–12)
Neutro Abs: 2.1 10*3/uL (ref 1.7–7.7)
Neutrophils Relative %: 52 % (ref 43–77)
Platelets: 159 10*3/uL (ref 150–400)
RBC: 4.95 MIL/uL (ref 4.22–5.81)
RDW: 15.8 % — AB (ref 11.5–15.5)
WBC: 4.1 10*3/uL (ref 4.0–10.5)

## 2013-08-10 LAB — BASIC METABOLIC PANEL WITH GFR
BUN: 31 mg/dL — AB (ref 6–23)
CO2: 27 mEq/L (ref 19–32)
CREATININE: 1.5 mg/dL — AB (ref 0.50–1.35)
Calcium: 10.1 mg/dL (ref 8.4–10.5)
Chloride: 104 mEq/L (ref 96–112)
GFR, EST AFRICAN AMERICAN: 51 mL/min — AB
GFR, Est Non African American: 44 mL/min — ABNORMAL LOW
GLUCOSE: 83 mg/dL (ref 70–99)
Potassium: 4 mEq/L (ref 3.5–5.3)
Sodium: 139 mEq/L (ref 135–145)

## 2013-08-10 LAB — URINALYSIS, MICROSCOPIC ONLY
Bacteria, UA: NONE SEEN
CRYSTALS: NONE SEEN
Casts: NONE SEEN
SQUAMOUS EPITHELIAL / LPF: NONE SEEN

## 2013-08-10 LAB — TSH: TSH: 2.385 u[IU]/mL (ref 0.350–4.500)

## 2013-08-10 LAB — MAGNESIUM: Magnesium: 2.2 mg/dL (ref 1.5–2.5)

## 2013-08-11 LAB — URINE CULTURE
Colony Count: NO GROWTH
Organism ID, Bacteria: NO GROWTH

## 2013-08-23 ENCOUNTER — Ambulatory Visit: Payer: Self-pay | Admitting: Physician Assistant

## 2013-08-31 ENCOUNTER — Ambulatory Visit: Payer: Self-pay | Admitting: Emergency Medicine

## 2013-09-06 ENCOUNTER — Ambulatory Visit: Payer: Self-pay | Admitting: Emergency Medicine

## 2013-09-27 ENCOUNTER — Encounter: Payer: Self-pay | Admitting: Physician Assistant

## 2013-09-27 ENCOUNTER — Ambulatory Visit (INDEPENDENT_AMBULATORY_CARE_PROVIDER_SITE_OTHER): Payer: Medicare Other | Admitting: Physician Assistant

## 2013-09-27 VITALS — BP 138/72 | HR 76 | Temp 97.9°F | Resp 16 | Wt 190.0 lb

## 2013-09-27 DIAGNOSIS — E785 Hyperlipidemia, unspecified: Secondary | ICD-10-CM

## 2013-09-27 DIAGNOSIS — I10 Essential (primary) hypertension: Secondary | ICD-10-CM

## 2013-09-27 DIAGNOSIS — E291 Testicular hypofunction: Secondary | ICD-10-CM

## 2013-09-27 DIAGNOSIS — Z79899 Other long term (current) drug therapy: Secondary | ICD-10-CM

## 2013-09-27 DIAGNOSIS — R42 Dizziness and giddiness: Secondary | ICD-10-CM

## 2013-09-27 DIAGNOSIS — R7309 Other abnormal glucose: Secondary | ICD-10-CM

## 2013-09-27 DIAGNOSIS — F32A Depression, unspecified: Secondary | ICD-10-CM

## 2013-09-27 DIAGNOSIS — E559 Vitamin D deficiency, unspecified: Secondary | ICD-10-CM

## 2013-09-27 DIAGNOSIS — F329 Major depressive disorder, single episode, unspecified: Secondary | ICD-10-CM

## 2013-09-27 LAB — BASIC METABOLIC PANEL WITH GFR
BUN: 20 mg/dL (ref 6–23)
CALCIUM: 9.2 mg/dL (ref 8.4–10.5)
CO2: 23 meq/L (ref 19–32)
Chloride: 108 mEq/L (ref 96–112)
Creat: 1.25 mg/dL (ref 0.50–1.35)
GFR, Est African American: 64 mL/min
GFR, Est Non African American: 55 mL/min — ABNORMAL LOW
GLUCOSE: 118 mg/dL — AB (ref 70–99)
Potassium: 4.1 mEq/L (ref 3.5–5.3)
SODIUM: 141 meq/L (ref 135–145)

## 2013-09-27 LAB — CBC WITH DIFFERENTIAL/PLATELET
BASOS ABS: 0 10*3/uL (ref 0.0–0.1)
BASOS PCT: 1 % (ref 0–1)
EOS ABS: 0.2 10*3/uL (ref 0.0–0.7)
Eosinophils Relative: 6 % — ABNORMAL HIGH (ref 0–5)
HCT: 41 % (ref 39.0–52.0)
Hemoglobin: 14.3 g/dL (ref 13.0–17.0)
Lymphocytes Relative: 26 % (ref 12–46)
Lymphs Abs: 1.1 10*3/uL (ref 0.7–4.0)
MCH: 28.2 pg (ref 26.0–34.0)
MCHC: 34.9 g/dL (ref 30.0–36.0)
MCV: 80.9 fL (ref 78.0–100.0)
Monocytes Absolute: 0.4 10*3/uL (ref 0.1–1.0)
Monocytes Relative: 9 % (ref 3–12)
NEUTROS ABS: 2.4 10*3/uL (ref 1.7–7.7)
NEUTROS PCT: 58 % (ref 43–77)
PLATELETS: 187 10*3/uL (ref 150–400)
RBC: 5.07 MIL/uL (ref 4.22–5.81)
RDW: 15.8 % — AB (ref 11.5–15.5)
WBC: 4.1 10*3/uL (ref 4.0–10.5)

## 2013-09-27 LAB — HEPATIC FUNCTION PANEL
ALT: 9 U/L (ref 0–53)
AST: 12 U/L (ref 0–37)
Albumin: 3.8 g/dL (ref 3.5–5.2)
Alkaline Phosphatase: 43 U/L (ref 39–117)
BILIRUBIN DIRECT: 0.1 mg/dL (ref 0.0–0.3)
Indirect Bilirubin: 0.5 mg/dL (ref 0.2–1.2)
Total Bilirubin: 0.6 mg/dL (ref 0.2–1.2)
Total Protein: 6 g/dL (ref 6.0–8.3)

## 2013-09-27 LAB — MAGNESIUM: MAGNESIUM: 2.1 mg/dL (ref 1.5–2.5)

## 2013-09-27 MED ORDER — AMPHETAMINE-DEXTROAMPHETAMINE 20 MG PO TABS: ORAL_TABLET | ORAL | Status: DC

## 2013-09-27 NOTE — Progress Notes (Signed)
HPI 80 y.o.male presents for 2 week follow up. He presented with syncope after using the bathroom and was found to be dehydrated. His wife has not been giving him fluid pills and stopped his minoxidil for the past 2-3 days, his wife states that she has been trying to increase his water but he still does not drink enough. He states he continues to feel dizzy, worse with movement.   Patient is high fall risk, difficult for him to leave the home due to this. Continuing left arm weakness/numbness and imbalance.   Lab Results  Component Value Date   CREATININE 1.50* 08/09/2013   BUN 31* 08/09/2013   NA 139 08/09/2013   K 4.0 08/09/2013   CL 104 08/09/2013   CO2 27 08/09/2013    Past Medical History  Diagnosis Date  . TIA (transient ischemic attack)   . Confusion   . H/O dizziness   . Arthritis   . GERD (gastroesophageal reflux disease)   . Hypertension   . Hyperlipidemia   . 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 Vitamin D deficiency   . Other testicular hypofunction      No Known Allergies    Current Outpatient Prescriptions on File Prior to Visit  Medication Sig Dispense Refill  . amphetamine-dextroamphetamine (ADDERALL) 20 MG tablet Take 1/2 to 1 tablet up to twice daily as needed for alertness and depression  60 tablet  0  . aspirin EC 81 MG tablet Take 81 mg by mouth daily.      Marland Kitchen atenolol (TENORMIN) 100 MG tablet Take 1 tablet (100 mg total) by mouth daily.  90 tablet  3  . Cholecalciferol (VITAMIN D-3) 5000 UNITS TABS Take 1 tablet by mouth 2 (two) times daily.      . clopidogrel (PLAVIX) 75 MG tablet Take 75 mg by mouth daily.      Marland Kitchen dextromethorphan-guaiFENesin (MUCINEX DM) 30-600 MG per 12 hr tablet Take 1 tablet by mouth 2 (two) times daily.  15 tablet  0  . diphenhydramine-acetaminophen (TYLENOL PM) 25-500 MG TABS Take 1 tablet by mouth at bedtime as needed.      . doxazosin (CARDURA) 4 MG tablet Take 1 tablet (4 mg total) by  mouth daily.  90 tablet  3  . gemfibrozil (LOPID) 600 MG tablet Take 600 mg by mouth 2 (two) times daily before a meal.      . losartan (COZAAR) 100 MG tablet       . Magnesium 300 MG CAPS Take 1 capsule by mouth daily.      . minoxidil (LONITEN) 10 MG tablet Take 1 tablet (10 mg total) by mouth at bedtime.  90 tablet  1  . Omega-3 Fatty Acids (FISH OIL) 1000 MG CPDR Take 1 capsule by mouth daily.      Marland Kitchen omeprazole (PRILOSEC) 40 MG capsule       . QUEtiapine (SEROQUEL) 50 MG tablet Take 50 mg by mouth at bedtime.      . sildenafil (VIAGRA) 100 MG tablet Take 1 tablet (100 mg total) by mouth as needed for erectile dysfunction.  10 tablet  11  . testosterone cypionate (DEPOTESTOTERONE CYPIONATE) 100 MG/ML injection Inject 200 mg into the muscle every 14 (fourteen) days. For IM use only       No current facility-administered medications on file prior to visit.    ROS: all negative expect above.   Physical: Danley Danker Weights   09/27/13  1145  Weight: 190 lb (86.183 kg)   BP 138/72  Pulse 76  Temp(Src) 97.9 F (36.6 C)  Resp 16  Wt 190 lb (86.183 kg) Constitutional: He appears well-developed and well-nourished.   Normocephalic and atraumatic.  Right Ear: External ear normal.  Left Ear: External ear normal.  Eyes: Conjunctivae and EOM are normal. Pupils are equal, round, and reactive to light.  Neck: Normal range of motion. Neck supple.  Cardiovascular: Normal rate and regular rhythm.  Murmur (2/6 systolic murmur) heard.  Pulmonary/Chest: Effort normal and breath sounds normal.  Abdominal: Soft. Bowel sounds are normal. There is no tenderness.  Musculoskeletal: Normal range of motion.  Neurological: He is alert. He has normal strength except 4/5 in left arm, no pronator drift. No cranial nerve deficit or sensory deficit. He displays a positive Romberg sign. Coordination and gait normal.  Skin: Skin is warm and dry. No rash noted.    Assessment and Plan: Continuing dizziness, memory  problems, left headache get carotid U/S ?vertebrobasilar insufficiency/dementia/history of CVA. will refer to neurology Recheck kidney function.  Still needs holter monitor  Imbalance- suggest PT, may need home health due to fall risk, difficulty leaving home due to dizziness.

## 2013-09-27 NOTE — Patient Instructions (Signed)

## 2013-09-29 ENCOUNTER — Other Ambulatory Visit: Payer: Self-pay | Admitting: Internal Medicine

## 2013-09-29 NOTE — Addendum Note (Signed)
Addended by: Vicie Mutters R on: 09/29/2013 09:04 AM   Modules accepted: Orders

## 2013-10-03 ENCOUNTER — Other Ambulatory Visit: Payer: Self-pay | Admitting: *Deleted

## 2013-10-03 MED ORDER — LOSARTAN POTASSIUM 100 MG PO TABS: 100.0000 mg | ORAL_TABLET | Freq: Every day | ORAL | Status: DC

## 2013-10-06 ENCOUNTER — Other Ambulatory Visit: Payer: Self-pay | Admitting: *Deleted

## 2013-10-06 ENCOUNTER — Other Ambulatory Visit: Payer: Self-pay | Admitting: Physician Assistant

## 2013-10-06 DIAGNOSIS — R42 Dizziness and giddiness: Secondary | ICD-10-CM

## 2013-10-06 MED ORDER — MINOXIDIL 10 MG PO TABS: 10.0000 mg | ORAL_TABLET | Freq: Every day | ORAL | Status: DC

## 2013-10-07 ENCOUNTER — Other Ambulatory Visit: Payer: Self-pay | Admitting: *Deleted

## 2013-10-07 MED ORDER — GEMFIBROZIL 600 MG PO TABS: 600.0000 mg | ORAL_TABLET | Freq: Two times a day (BID) | ORAL | Status: DC

## 2013-11-03 ENCOUNTER — Ambulatory Visit: Payer: Medicare Other | Admitting: Neurology

## 2013-11-07 ENCOUNTER — Encounter: Payer: Self-pay | Admitting: Neurology

## 2013-11-07 ENCOUNTER — Ambulatory Visit (INDEPENDENT_AMBULATORY_CARE_PROVIDER_SITE_OTHER): Payer: Medicare Other | Admitting: Neurology

## 2013-11-07 VITALS — BP 110/70 | HR 74 | Ht 70.0 in | Wt 191.1 lb

## 2013-11-07 DIAGNOSIS — I6350 Cerebral infarction due to unspecified occlusion or stenosis of unspecified cerebral artery: Secondary | ICD-10-CM

## 2013-11-07 DIAGNOSIS — I63531 Cerebral infarction due to unspecified occlusion or stenosis of right posterior cerebral artery: Secondary | ICD-10-CM

## 2013-11-07 DIAGNOSIS — F0150 Vascular dementia without behavioral disturbance: Secondary | ICD-10-CM

## 2013-11-07 DIAGNOSIS — R42 Dizziness and giddiness: Secondary | ICD-10-CM

## 2013-11-07 NOTE — Patient Instructions (Signed)
1.  We will refer you to a physical therapist to help with dizziness 2.  Continue aspirin 81mg  daily and Plavix daily 3.  Cholesterol control 4.  Mediterranean diet 5.  Regular exercise (walks) 6.  Call with questions or concerns.

## 2013-11-07 NOTE — Progress Notes (Signed)
NEUROLOGY CONSULTATION NOTE  Lance Ramirez MRN: [id number] DOB: Feb 16, 1934  Referring provider: Vicie Mutters, PA-C Primary care provider: Unk Pinto, MD  Reason for consult:  dizziness  HISTORY OF PRESENT ILLNESS: Lance Ramirez is a 80 year old right-handed man with history of stroke, hypertension, hyperlipidemia, arthritis, GERD, cervical disc disease status post decompression/discectomy and dizziness who presents for dizziness.  He is accompanied by his wife.  Records and images reviewed.  He has history of recurrent strokes.  On 02/10/11, he had an acute infarct of the left thalamus and posterior limb of the internal capsule, presenting as left-sided numbness and weakness.  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  He had another stroke on 05/14/12, presenting as visual disturbance.  MRA of the head from 05/15/12 again showed high grade stenosis and occlusion of the left posterior cerebral artery, as well as occlusion of the right posterior cerebral artery.  2D echo at this time revealed LVEF 55-60% with no cardiac source of embolism.  Carotid duplex revealed minimal plaque and no hemodynamically significant ICA stenosis.  Since this last stroke, he had had memory problems, including difficulty remembering names of people he knows.  He denies problems recognizing people.  He is able to perform his ADLs overall.  However, he no longer drives, his wife now pays the bills and he relies on his wife to administer his medications.  There is no change in personality or behavior.  Memory problems have been stable.  He was previously on Aricept, but this was discontinued due to side effects.  Also, since the last stroke, he has had dizziness.  It is difficult for him to elaborate, but he feels unsteady on his feet.  He has a woozy feeling in his head when he moves around and it only is absent when he is completely  still.  There is mild sense of movement but no real vertigo.  He denies focal weakness, slurred speech, or visual disturbance.  He seldom has a headache.  He has tinnitus.  He currently takes aspirin 81mg  and Plavix.    Labs from 05/31/13 included Hgb A1c of 6 and LDL of 143.    PAST MEDICAL HISTORY: Past Medical History  Diagnosis Date  . TIA (transient ischemic attack)   . Confusion   . H/O dizziness   . Arthritis   . GERD (gastroesophageal reflux disease)   . Hypertension   . Hyperlipidemia   . 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 Vitamin D deficiency   . Other testicular hypofunction     PAST SURGICAL HISTORY: Past Surgical History  Procedure Laterality Date  . Hernia repair    . Back surgery    . 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 Anterior cervical decomp/discectomy fusion N/A 10/25/2012    Procedure: ANTERIOR CERVICAL DECOMPRESSION/DISCECTOMY FUSION CERVICAL THREE-FOUR 1 LEVEL/HARDWARE REMOVAL;  Surgeon: Ophelia Charter, MD;  Location: [facility];  Service: Neurosurgery;  Laterality: N/A;  Cervical three-four anterior cervical decompression with fusion interbody prothesis plating and bonegraft with removal of old Premier plate    MEDICATIONS: Current Outpatient Prescriptions on File Prior to Visit  Medication Sig Dispense Refill  . amphetamine-dextroamphetamine (ADDERALL) 20 MG tablet Take 1/2 to 1 tablet up to twice daily as needed for alertness and depression  60 tablet  0  . aspirin EC  81 MG tablet Take 81 mg by mouth daily.      Marland Kitchen atenolol (TENORMIN) 100 MG tablet Take 1 tablet (100 mg total) by mouth daily.  90 tablet  3  . Cholecalciferol (VITAMIN D-3) 5000 UNITS TABS Take 1 tablet by mouth 2 (two) times daily.      . clopidogrel (PLAVIX) 75 MG tablet Take 75 mg by mouth daily.      . diphenhydramine-acetaminophen (TYLENOL PM) 25-500 MG TABS Take 1 tablet by mouth at bedtime as  needed.      . doxazosin (CARDURA) 4 MG tablet Take 1 tablet (4 mg total) by mouth daily.  90 tablet  3  . gemfibrozil (LOPID) 600 MG tablet Take 1 tablet (600 mg total) by mouth 2 (two) times daily before a meal.  28 tablet  0  . Magnesium 300 MG CAPS Take 1 capsule by mouth daily.      . minoxidil (LONITEN) 10 MG tablet Take 1 tablet (10 mg total) by mouth at bedtime.  90 tablet  3  . Omega-3 Fatty Acids (FISH OIL) 1000 MG CPDR Take 1 capsule by mouth daily.      Marland Kitchen omeprazole (PRILOSEC) 40 MG capsule       . QUEtiapine (SEROQUEL) 25 MG tablet TAKE 1 TABLET BY MOUTH TWICE A DAY FOR MOOD OR SLEEP  180 tablet  1  . testosterone cypionate (DEPOTESTOTERONE CYPIONATE) 100 MG/ML injection Inject 200 mg into the muscle every 14 (fourteen) days. For IM use only      . VIAGRA 100 MG tablet TAKE 1/2 TO 1 TABLEY BY MOUTH DAILY AS NEEDED  5 tablet  0   No current facility-administered medications on file prior to visit.    ALLERGIES: No Known Allergies  FAMILY HISTORY: Family History  Problem Relation Age of Onset  . Sudden death Mother   . Other Father     SOCIAL HISTORY: History   Social History  . Marital Status: Married    Spouse Name: N/A    Number of Children: N/A  . Years of Education: N/A   Occupational History  . Not on file.   Social History Main Topics  . Smoking status: Former Smoker -- 1.00 packs/day for 30 years    Types: Cigarettes    Quit date: 03/12/1978  . Smokeless tobacco: Former Systems developer    Types: Chew    Quit date: 03/12/1978  . Alcohol Use: No  . Drug Use: No  . Sexual Activity: No   Other Topics Concern  . Not on file   Social History Narrative  . No narrative on file    REVIEW OF SYSTEMS: Constitutional: No fevers, chills, or sweats, no generalized fatigue, change in appetite Eyes: No visual changes, double vision, eye pain Ear, nose and throat: No hearing loss, ear pain, nasal congestion, sore throat Cardiovascular: No chest pain,  palpitations Respiratory:  No shortness of breath at rest or with exertion, wheezes GastrointestinaI: No nausea, vomiting, diarrhea, abdominal pain, fecal incontinence Genitourinary:  No dysuria, urinary retention or frequency Musculoskeletal:  No neck pain, back pain Integumentary: No rash, pruritus, skin lesions Neurological: as above Psychiatric: No depression, insomnia, anxiety Endocrine: No palpitations, fatigue, diaphoresis, mood swings, change in appetite, change in weight, increased thirst Hematologic/Lymphatic:  No anemia, purpura, petechiae. Allergic/Immunologic: no itchy/runny eyes, nasal congestion, recent allergic reactions, rashes  PHYSICAL EXAM: Filed Vitals:   11/07/13 1223  BP: 110/70  Pulse: 74   General: No acute distress Head:  Normocephalic/atraumatic Neck:  supple, no paraspinal tenderness, full range of motion Back: No paraspinal tenderness Heart: regular rate and rhythm Lungs: Clear to auscultation bilaterally. Vascular: No carotid bruits. Neurological Exam: Mental status: alert and oriented to person and city but not time, remote memory intact, recent memory poor, fund of knowledge intact, attention and concentration intact,  Montreal Cognitive Assessment  11/07/2013  Visuospatial/ Executive (0/5) 4  Naming (0/3) 2  Attention: Read list of digits (0/2) 1  Attention: Read list of letters (0/1) 1  Attention: Serial 7 subtraction starting at 100 (0/3) 3  Language: Repeat phrase (0/2) 1  Language : Fluency (0/1) 0  Abstraction (0/2) 2  Delayed Recall (0/5) 0  Orientation (0/6) 2  Total 16  Adjusted Score (based on education) 16    speech fluent and not dysarthric, language intact. Cranial nerves: CN I: not tested CN II: pupils equal, round and reactive to light, visual fields intact, fundi unremarkable, without vessel changes, exudates, hemorrhages or papilledema. CN III, IV, VI:  full range of motion, no nystagmus, no ptosis CN V: facial sensation  intact CN VII: upper and lower face symmetric CN VIII: hearing intact CN IX, X: gag intact, uvula midline CN XI: sternocleidomastoid and trapezius muscles intact CN XII: tongue midline Bulk & Tone: normal, no fasciculations. Motor: 5/5 throughout Sensation: reduced temperature and vibration in the feet. Deep Tendon Reflexes: 2+ throughout except absent in the ankles.  Toes downgoing. Finger to nose testing: no dysmetria Gait: normal station and stride.  Able to turn and walk in tandem with mild unsteadiness. Romberg negative.  IMPRESSION: Dizziness, chronic.   Cerebrovascular disease with recurrent stroke.  He does have occlusion and high grade stenosis of both PCAs.  There is really no standard of care for surgery and medication management would be best treatment option. Vascular dementia  PLAN: 1.  The only thing that may be helpful for dizziness at this time would be vestibular rehab, which we will refer. 2.  Already on ASA 81mg  and Plavix 75mg  for secondary stroke prevention 3.  On gemfibrozil.  Consider statin as LDL goal should be less than 100. 4.  Blood pressure pretty well controlled 5.  Lifestyle modification:  Regular walks, Mediterranean diet. 6.  Follow up as needed.  45 minutes spent with patient, over 50% spent counseling and discussing management options.  Thank you for allowing me to take part in the care of this patient.  Metta Clines, DO  CC:  Vicie Mutters, PA-C  Unk Pinto, MD

## 2013-11-11 ENCOUNTER — Other Ambulatory Visit: Payer: Self-pay | Admitting: Physician Assistant

## 2013-11-11 DIAGNOSIS — F329 Major depressive disorder, single episode, unspecified: Secondary | ICD-10-CM

## 2013-11-11 DIAGNOSIS — F32A Depression, unspecified: Secondary | ICD-10-CM

## 2013-11-11 MED ORDER — AMPHETAMINE-DEXTROAMPHETAMINE 20 MG PO TABS: ORAL_TABLET | ORAL | Status: DC

## 2013-11-17 ENCOUNTER — Encounter: Payer: Self-pay | Admitting: Internal Medicine

## 2013-11-29 ENCOUNTER — Other Ambulatory Visit: Payer: Self-pay | Admitting: *Deleted

## 2013-11-29 MED ORDER — GEMFIBROZIL 600 MG PO TABS: 600.0000 mg | ORAL_TABLET | Freq: Two times a day (BID) | ORAL | Status: DC

## 2013-12-01 ENCOUNTER — Other Ambulatory Visit: Payer: Self-pay | Admitting: Internal Medicine

## 2013-12-01 MED ORDER — SILDENAFIL CITRATE 100 MG PO TABS: ORAL_TABLET | ORAL | Status: DC

## 2013-12-05 ENCOUNTER — Other Ambulatory Visit: Payer: Self-pay | Admitting: *Deleted

## 2013-12-05 MED ORDER — CLOPIDOGREL BISULFATE 75 MG PO TABS: 75.0000 mg | ORAL_TABLET | Freq: Every day | ORAL | Status: DC

## 2013-12-12 ENCOUNTER — Encounter: Payer: Self-pay | Admitting: Internal Medicine

## 2013-12-28 ENCOUNTER — Other Ambulatory Visit: Payer: Self-pay | Admitting: Internal Medicine

## 2013-12-28 DIAGNOSIS — F32A Depression, unspecified: Secondary | ICD-10-CM

## 2013-12-28 DIAGNOSIS — F329 Major depressive disorder, single episode, unspecified: Secondary | ICD-10-CM

## 2013-12-28 MED ORDER — AMPHETAMINE-DEXTROAMPHETAMINE 20 MG PO TABS: ORAL_TABLET | ORAL | Status: DC

## 2014-01-06 ENCOUNTER — Encounter: Payer: Self-pay | Admitting: Internal Medicine

## 2014-01-25 ENCOUNTER — Other Ambulatory Visit: Payer: Self-pay

## 2014-01-25 MED ORDER — DOXAZOSIN MESYLATE 4 MG PO TABS: 4.0000 mg | ORAL_TABLET | Freq: Every day | ORAL | Status: DC

## 2014-01-25 NOTE — Telephone Encounter (Signed)
Patient request local 30 day supply until mail order arrives

## 2014-02-14 ENCOUNTER — Other Ambulatory Visit: Payer: Self-pay | Admitting: *Deleted

## 2014-02-14 DIAGNOSIS — F32A Depression, unspecified: Secondary | ICD-10-CM

## 2014-02-14 DIAGNOSIS — F329 Major depressive disorder, single episode, unspecified: Secondary | ICD-10-CM

## 2014-02-14 MED ORDER — AMPHETAMINE-DEXTROAMPHETAMINE 20 MG PO TABS
ORAL_TABLET | ORAL | Status: DC
Start: 2014-02-14 — End: 2014-04-03

## 2014-03-01 ENCOUNTER — Encounter: Payer: Self-pay | Admitting: Internal Medicine

## 2014-03-08 ENCOUNTER — Encounter: Payer: Self-pay | Admitting: Internal Medicine

## 2014-03-08 ENCOUNTER — Ambulatory Visit: Payer: PPO | Admitting: Internal Medicine

## 2014-03-08 NOTE — Progress Notes (Signed)
Patient ID: Lance Ramirez, male   DOB: 02-27-1934, 79 y.o.    C A N C E L L E D       A t      A P P O I N T M E N T         T I M E

## 2014-03-28 ENCOUNTER — Other Ambulatory Visit: Payer: Self-pay | Admitting: *Deleted

## 2014-03-28 MED ORDER — QUETIAPINE FUMARATE 25 MG PO TABS: ORAL_TABLET | ORAL | Status: DC

## 2014-03-28 MED ORDER — ATENOLOL 100 MG PO TABS: 100.0000 mg | ORAL_TABLET | Freq: Every day | ORAL | Status: DC

## 2014-03-28 MED ORDER — DOXAZOSIN MESYLATE 4 MG PO TABS: 4.0000 mg | ORAL_TABLET | Freq: Every day | ORAL | Status: DC

## 2014-03-28 MED ORDER — CLOPIDOGREL BISULFATE 75 MG PO TABS: 75.0000 mg | ORAL_TABLET | Freq: Every day | ORAL | Status: DC

## 2014-03-28 MED ORDER — GEMFIBROZIL 600 MG PO TABS: 600.0000 mg | ORAL_TABLET | Freq: Two times a day (BID) | ORAL | Status: DC

## 2014-03-28 MED ORDER — MINOXIDIL 10 MG PO TABS: 10.0000 mg | ORAL_TABLET | Freq: Every day | ORAL | Status: DC

## 2014-04-03 ENCOUNTER — Other Ambulatory Visit: Payer: Self-pay | Admitting: *Deleted

## 2014-04-03 DIAGNOSIS — F32A Depression, unspecified: Secondary | ICD-10-CM

## 2014-04-03 DIAGNOSIS — F329 Major depressive disorder, single episode, unspecified: Secondary | ICD-10-CM

## 2014-04-03 MED ORDER — AMPHETAMINE-DEXTROAMPHETAMINE 20 MG PO TABS: ORAL_TABLET | ORAL | Status: DC

## 2014-05-04 ENCOUNTER — Encounter: Payer: PPO | Admitting: Internal Medicine

## 2014-05-10 ENCOUNTER — Ambulatory Visit: Payer: PPO | Admitting: Internal Medicine

## 2014-05-10 NOTE — Progress Notes (Signed)
Patient ID: Lance Ramirez, male   DOB: [birth year], 81 y.o.   MRN: [id number]   R E S C H E D U L E D

## 2014-05-20 ENCOUNTER — Other Ambulatory Visit: Payer: Self-pay | Admitting: Internal Medicine

## 2014-05-22 ENCOUNTER — Other Ambulatory Visit: Payer: Self-pay | Admitting: Internal Medicine

## 2014-05-22 DIAGNOSIS — F32A Depression, unspecified: Secondary | ICD-10-CM

## 2014-05-22 DIAGNOSIS — F329 Major depressive disorder, single episode, unspecified: Secondary | ICD-10-CM

## 2014-05-22 MED ORDER — AMPHETAMINE-DEXTROAMPHETAMINE 20 MG PO TABS: ORAL_TABLET | ORAL | Status: DC

## 2014-05-26 ENCOUNTER — Ambulatory Visit (INDEPENDENT_AMBULATORY_CARE_PROVIDER_SITE_OTHER): Payer: PPO | Admitting: Internal Medicine

## 2014-05-26 ENCOUNTER — Encounter: Payer: Self-pay | Admitting: Internal Medicine

## 2014-05-26 VITALS — BP 158/96 | HR 72 | Temp 97.5°F | Resp 16 | Ht 69.75 in | Wt 191.8 lb

## 2014-05-26 DIAGNOSIS — Z9181 History of falling: Secondary | ICD-10-CM

## 2014-05-26 DIAGNOSIS — R7303 Prediabetes: Secondary | ICD-10-CM

## 2014-05-26 DIAGNOSIS — F3341 Major depressive disorder, recurrent, in partial remission: Secondary | ICD-10-CM | POA: Insufficient documentation

## 2014-05-26 DIAGNOSIS — F329 Major depressive disorder, single episode, unspecified: Secondary | ICD-10-CM

## 2014-05-26 DIAGNOSIS — Z1212 Encounter for screening for malignant neoplasm of rectum: Secondary | ICD-10-CM

## 2014-05-26 DIAGNOSIS — F32A Depression, unspecified: Secondary | ICD-10-CM

## 2014-05-26 DIAGNOSIS — Z1331 Encounter for screening for depression: Secondary | ICD-10-CM

## 2014-05-26 DIAGNOSIS — Z125 Encounter for screening for malignant neoplasm of prostate: Secondary | ICD-10-CM

## 2014-05-26 DIAGNOSIS — Z0001 Encounter for general adult medical examination with abnormal findings: Secondary | ICD-10-CM

## 2014-05-26 DIAGNOSIS — Z0000 Encounter for general adult medical examination without abnormal findings: Secondary | ICD-10-CM

## 2014-05-26 DIAGNOSIS — R6889 Other general symptoms and signs: Secondary | ICD-10-CM

## 2014-05-26 DIAGNOSIS — E559 Vitamin D deficiency, unspecified: Secondary | ICD-10-CM

## 2014-05-26 DIAGNOSIS — E349 Endocrine disorder, unspecified: Secondary | ICD-10-CM

## 2014-05-26 DIAGNOSIS — E785 Hyperlipidemia, unspecified: Secondary | ICD-10-CM

## 2014-05-26 DIAGNOSIS — K219 Gastro-esophageal reflux disease without esophagitis: Secondary | ICD-10-CM

## 2014-05-26 DIAGNOSIS — Z79899 Other long term (current) drug therapy: Secondary | ICD-10-CM

## 2014-05-26 DIAGNOSIS — R7309 Other abnormal glucose: Secondary | ICD-10-CM

## 2014-05-26 DIAGNOSIS — I10 Essential (primary) hypertension: Secondary | ICD-10-CM

## 2014-05-26 LAB — CBC WITH DIFFERENTIAL/PLATELET
BASOS ABS: 0.1 10*3/uL (ref 0.0–0.1)
Basophils Relative: 1 % (ref 0–1)
EOS PCT: 3 % (ref 0–5)
Eosinophils Absolute: 0.2 10*3/uL (ref 0.0–0.7)
HCT: 38.3 % — ABNORMAL LOW (ref 39.0–52.0)
HEMOGLOBIN: 12.9 g/dL — AB (ref 13.0–17.0)
Lymphocytes Relative: 24 % (ref 12–46)
Lymphs Abs: 1.5 10*3/uL (ref 0.7–4.0)
MCH: 28.9 pg (ref 26.0–34.0)
MCHC: 33.7 g/dL (ref 30.0–36.0)
MCV: 85.7 fL (ref 78.0–100.0)
MONO ABS: 0.7 10*3/uL (ref 0.1–1.0)
MPV: 11 fL (ref 8.6–12.4)
Monocytes Relative: 11 % (ref 3–12)
Neutro Abs: 3.7 10*3/uL (ref 1.7–7.7)
Neutrophils Relative %: 61 % (ref 43–77)
Platelets: 261 10*3/uL (ref 150–400)
RBC: 4.47 MIL/uL (ref 4.22–5.81)
RDW: 14.1 % (ref 11.5–15.5)
WBC: 6.1 10*3/uL (ref 4.0–10.5)

## 2014-05-26 LAB — HEPATIC FUNCTION PANEL
ALT: 11 U/L (ref 0–53)
AST: 15 U/L (ref 0–37)
Albumin: 3.7 g/dL (ref 3.5–5.2)
Alkaline Phosphatase: 55 U/L (ref 39–117)
BILIRUBIN DIRECT: 0.1 mg/dL (ref 0.0–0.3)
Indirect Bilirubin: 0.3 mg/dL (ref 0.2–1.2)
Total Bilirubin: 0.4 mg/dL (ref 0.2–1.2)
Total Protein: 6.4 g/dL (ref 6.0–8.3)

## 2014-05-26 LAB — LIPID PANEL
CHOLESTEROL: 172 mg/dL (ref 0–200)
HDL: 43 mg/dL (ref >=40)
LDL Cholesterol: 116 mg/dL — ABNORMAL HIGH (ref 0–99)
Total CHOL/HDL Ratio: 4 Ratio
Triglycerides: 65 mg/dL (ref <150)
VLDL: 13 mg/dL (ref 0–40)

## 2014-05-26 LAB — HEMOGLOBIN A1C
Hgb A1c MFr Bld: 6 % — ABNORMAL HIGH (ref <5.7)
Mean Plasma Glucose: 126 mg/dL — ABNORMAL HIGH (ref <117)

## 2014-05-26 LAB — BASIC METABOLIC PANEL WITH GFR
BUN: 29 mg/dL — ABNORMAL HIGH (ref 6–23)
CALCIUM: 9.1 mg/dL (ref 8.4–10.5)
CHLORIDE: 106 meq/L (ref 96–112)
CO2: 27 meq/L (ref 19–32)
Creat: 2.16 mg/dL — ABNORMAL HIGH (ref 0.50–1.35)
GFR, EST NON AFRICAN AMERICAN: 28 mL/min — AB
GFR, Est African American: 33 mL/min — ABNORMAL LOW
GLUCOSE: 104 mg/dL — AB (ref 70–99)
Potassium: 4 mEq/L (ref 3.5–5.3)
Sodium: 141 mEq/L (ref 135–145)

## 2014-05-26 LAB — TSH: TSH: 4.283 u[IU]/mL (ref 0.350–4.500)

## 2014-05-26 LAB — MAGNESIUM: Magnesium: 2 mg/dL (ref 1.5–2.5)

## 2014-05-26 MED ORDER — MINOXIDIL 10 MG PO TABS: ORAL_TABLET | ORAL | Status: DC

## 2014-05-26 NOTE — Progress Notes (Signed)
Patient ID: Lance Ramirez, male   DOB: Apr 16, 1933, 81 y.o.   MRN: [id number]  MEDICARE ANNUAL WELLNESS VISIT AND CPE  Assessment:   1. Essential hypertension  - Microalbumin / creatinine urine ratio - EKG 12-Lead - [place], RETROPERITNL ABD,  LTD - TSH - minoxidil (LONITEN) 10 MG tablet; Take 1/2 to 1 tablet daily for BP as directed  Dispense: 90 tablet; Refill: 1  2. Hyperlipidemia  - Lipid panel  3. Prediabetes  - Hemoglobin A1c - Insulin, random  4. Vitamin D deficiency  - Vit D  25 hydroxy   5. Testosterone deficiency   6. Gastroesophageal reflux disease   7. Screening for rectal cancer  - POC Hemoccult Bld/Stl   8. Prostate cancer screening  - PSA  9. At low risk for fall   10. Medication management  - Urine Microscopic - CBC with Differential/Platelet - BASIC METABOLIC PANEL WITH GFR - Hepatic function panel - Magnesium  11. Depression screen  - Screen Negative   12. Depression, controlled   13. ASCVD, s/p CVA(S) and TIA(s) -   - advised expedient OV, ER visit or call if another acute change in neuro status   14. Routine general medical examination at a health care facility   Plan:   During the course of the visit the patient was educated and counseled about appropriate screening and preventive services including:    Pneumococcal vaccine   Influenza vaccine  Td vaccine  Screening electrocardiogram  Bone densitometry screening  Colorectal cancer screening  Diabetes screening  Glaucoma screening  Nutrition counseling   Advanced directives: requested  Screening recommendations, referrals: Vaccinations: Immunization History  Administered Date(s) Administered  . DT 11/28/2008  . Pneumococcal Polysaccharide-23 11/28/2008  . Zoster 09/16/2006  Influenza vaccine Oct 2015 Prevnar vaccine undecided Hep B vaccine not indicated  Nutrition assessed and recommended  Colonoscopy 04/21/21009 Recommended yearly  ophthalmology/optometry visit for glaucoma screening and checkup Recommended yearly dental visit for hygiene and checkup Advanced directives - yes  Conditions/risks identified: BMI: Discussed weight loss, diet, and increase physical activity.  Increase physical activity: AHA recommends 150 minutes of physical activity a week.  Medications reviewed PreDiabetes is not at goal, ACE/ARB therapy: not indicated Urinary Incontinence is not an issue: discussed non pharmacology and pharmacology options.  Fall risk: low- discussed PT, home fall assessment, medications.   Subjective:    Lance Ramirez  presents for [employer] Visit and complete physical.  No prior medicare wellness visit is known. This very nice 81 y.o.male presents for follow up with Hypertension, Hyperlipidemia, Pre-Diabetes and Vitamin D Deficiency.    Patient is treated for HTN since 1980 & today's BP was high at 158/96 by nurse and higher by my recheck at 186/106 x 2 bilat UE. Apparently he and his wife who manages/supervises his meds had discontinued his Minoxidil for unknown reasons. Patient has had no complaints of any cardiac type chest pain, palpitations, dyspnea/orthopnea/PND, dizziness, claudication, or dependent edema.He hid have an episode of slurred speech and increased confusion lasting several hours, but the wife/caretaker recognized no need for evaluation.    Hyperlipidemia is controlled with diet & meds. Patient denies myalgias or other med SE's. Last Lipids were not at goal with Total Chol 172; HDL 43; and elevated LDL  116; Triglycerides 65 on 05/26/2014.   Also, the patient has history of being overweight and having consequent  PreDiabetes and has had no symptoms of reactive hypoglycemia, diabetic polys, paresthesias or visual blurring.  Last A1c was  6.0% on 05/26/2014. Patient has previously advised prudent weight reduction foosd plans and recommended to read Dr Lear Ng books including "The End of  Diabetes".   Further, the patient also has history of Vitamin D Deficiency and supplements vitamin D without any suspected side-effects. Last vitamin D was  73 on  05/26/2014.   Names of Other Physician/Practitioners you currently use: 1. [facility] here for primary care 2. No eye doctor, advised eye exam - given names of eye doctors to call 3. Dr Tito Dine, DDS, dentist, last visit 2014 - encouraged call for exam  Patient Care Team: Unk Pinto, MD as PCP - General (Internal Medicine) Ronald Lobo, MD as Consulting Physician (Gastroenterology) [name], DO as Consulting Physician (Neurology) Newman Pies, MD as Consulting Physician (Neurosurgery) Vickey Huger, MD as Consulting Physician (Orthopedic Surgery)  Medication Review: Medication Sig  .  ADDERALL 20 MG tablet Take 1/2 to 1 tablet up to twice daily as needed for alertness and depression  . aspirin EC 81 MG tablet Take 81 mg by mouth daily.  Marland Kitchen atenolol  100 MG tablet Take 1 tablet (100 mg total) by mouth daily.  Marland Kitchen VITAMIN D 5000 UNITS TABS Take 1 tablet by mouth 2 (two) times daily.  . clopidogrel  75 MG tablet TAKE 1 TABLET EVERY DAY  . diphenhydramine-acetaminophen  25-500 MG  Take 1 tablet by mouth at bedtime as needed.  . doxazosin  4 MG tablet Take 1 tablet (4 mg total) by mouth daily.  Marland Kitchen gemfibrozil  600 MG tablet Take 1 tablet (600 mg total) by mouth 2 (two) times daily before a meal.  . Magnesium 300 MG CAPS Take 1 capsule by mouth daily.  Marland Kitchen omeprazole  40 MG capsule Take 1 cap daily for Acid reflux & indigstion  . QUEtiapine  25 MG tablet TAKE 1 TABLET BY MOUTH TWICE A DAY FOR MOOD OR SLEEP  . sildenafil 100 MG tablet Take 1/2 to 1 tablet daily as needed for XXXX  . DEPO-TESTOTERONE 100 MG/ML inj Inject 200 mg into the muscle every 14 (fourteen) days. For IM use only   Current Problems (verified) Patient Active Problem List   Diagnosis Date Noted  . Depression,  controlled 05/26/2014  . Prediabetes 05/31/2013  . Medication management 05/31/2013  . GERD (gastroesophageal reflux disease)   . Vitamin D deficiency   . Testosterone deficiency   . CVA (cerebral infarction) 05/14/2012  . Hypertension 02/10/2011  . Hyperlipidemia 02/10/2011   Screening Tests Health Maintenance  Topic Date Due  . TETANUS/TDAP  01/22/1955  . PNA vac Low Risk Adult (2 of 2 - PCV13) 11/28/2009  . INFLUENZA VACCINE  10/08/2013  . COLONOSCOPY  06/28/2017  . ZOSTAVAX  Completed    Immunization History  Administered Date(s) Administered  . DT 11/28/2008  . Pneumococcal Polysaccharide-23 11/28/2008  . Zoster 09/16/2006    Preventative care: Last colonoscopy: 06/29/2007  History reviewed: allergies, current medications, past family history, past medical history, past social history, past surgical history and problem list  Risk Factors: Tobacco History  Substance Use Topics  . Smoking status: Former Smoker -- 1.00 packs/day for 30 years    Types: Cigarettes    Quit date: 03/12/1978  . Smokeless tobacco: Former Systems developer    Types: Chew    Quit date: 03/12/1978  . Alcohol Use: No   He does not smoke.  Patient is a former smoker. Are there smokers in your home (other than you)?  No  Alcohol Current alcohol use: none  Caffeine Current caffeine use: coffee 1 cup /day  Exercise Current exercise: walking  Nutrition/Diet Current diet: in general, a "healthy" diet    Cardiac risk factors: advanced age (older than 35 for men, 35 for women), dyslipidemia, family history of premature cardiovascular disease, hypertension, male gender and sedentary lifestyle.  Depression Screen (Note: if answer to either of the following is "Yes", a more complete depression screening is indicated)   Q1: Over the past two weeks, have you felt down, depressed or hopeless? No  Q2: Over the past two weeks, have you felt little interest or pleasure in doing things? No  Have you lost  interest or pleasure in daily life? No  Do you often feel hopeless? No  Do you cry easily over simple problems? No  Activities of Daily Living In your present state of health, do you have any difficulty performing the following activities?:  Driving? No Managing money?  No Feeding yourself? No Getting from bed to chair? No Climbing a flight of stairs? No Preparing food and eating?: No Bathing or showering? No Getting dressed: No Getting to the toilet? No Using the toilet:No Moving around from place to place: No In the past year have you fallen or had a near fall?:No   Are you sexually active?  Yes  Do you have more than one partner?  No  Vision Difficulties: No  Hearing Difficulties: No Do you often ask people to speak up or repeat themselves? No Do you experience ringing or noises in your ears? No Do you have difficulty understanding soft or whispered voices? No  Cognition  Do you feel that you have a problem with memory?No  Do you often misplace items? sometimes  Do you feel safe at home?  Yes  Advanced directives Does patient have a [place]? Yes Does patient have a Living Will? Yes  Past Medical History  Diagnosis Date  . TIA (transient ischemic attack)   . Confusion   . H/O dizziness   . Arthritis   . GERD (gastroesophageal reflux disease)   . Hypertension   . Hyperlipidemia   . 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 Vitamin D deficiency   . Other testicular hypofunction    Past Surgical History  Procedure Laterality Date  . Hernia repair    . Back surgery    . 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 Anterior cervical decomp/discectomy fusion/effrey Flo Shanks, MD N/A 10/25/2012       ROS: Constitutional: Denies fever, chills, weight loss/gain, headaches, insomnia, fatigue, night sweats or change in appetite. Eyes: Denies redness, blurred vision, diplopia,  discharge, itchy or watery eyes.  ENT: Denies discharge, congestion, post nasal drip, epistaxis, sore throat, earache, hearing loss, dental pain, Tinnitus, Vertigo, Sinus pain or snoring.  Cardio: Denies chest pain, palpitations, irregular heartbeat, syncope, dyspnea, diaphoresis, orthopnea, PND, claudication or edema Respiratory: denies cough, dyspnea, DOE, pleurisy, hoarseness, laryngitis or wheezing.  Gastrointestinal: Denies dysphagia, heartburn, reflux, water brash, pain, cramps, nausea, vomiting, bloating, diarrhea, constipation, hematemesis, melena, hematochezia, jaundice or hemorrhoids Genitourinary: Denies dysuria, frequency, urgency, nocturia, hesitancy, discharge, hematuria or flank pain Musculoskeletal: Denies arthralgia, myalgia, stiffness, Jt. Swelling, pain, limp or strain/sprain. Denies Falls. Skin: Denies puritis, rash, hives, warts, acne, eczema or change in skin lesion Neuro: No weakness, tremor, incoordination, spasms, paresthesia or pain Psychiatric: Hx/o intermittant confusion,  And short term and long term memory loss & sensory loss. Denies Depression on current meds. Endocrine: Denies change in weight, skin, hair change, nocturia, and paresthesia, diabetic polys, visual blurring or hyper / hypo glycemic episodes.  Heme/Lymph: No excessive bleeding, bruising or enlarged lymph nodes.  Objective:     BP 158/96   Pulse 72  Temp 97.5 F   Resp 16  Ht 5' 9.75"   Wt 191 lb 12.8 oz     BMI 27.71 (Overweight)  General Appearance:  Alert  WD/WN, male , in no apparent distress. Eyes: PERRLA, EOMs, conjunctiva no swelling or erythema, normal fundi and vessels. Sinuses: No frontal/maxillary tenderness ENT/Mouth: EACs patent / TMs  nl. Nares clear without erythema, swelling, mucoid exudates. Oral hygiene is good. No erythema, swelling, or exudate. Tongue normal, non-obstructing. Tonsils not swollen or erythematous. Hearing normal.  Neck: Supple, thyroid normal. No bruits, nodes  or JVD. Respiratory: Respiratory effort normal.  BS equal and clear bilateral without rales, rhonci, wheezing or stridor. Cardio: Heart sounds are normal with regular rate and rhythm and no murmurs, rubs or gallops. Peripheral pulses are normal and equal bilaterally without edema. No aortic or femoral bruits. Chest: symmetric with normal excursions and percussion.  Abdomen: Flat, soft, with nl bowel sounds. Nontender, no guarding, rebound, hernias, masses, or organomegaly.  Lymphatics: Non tender without lymphadenopathy.  Genitourinary: No hernias.Testes nl. DRE - prostate nl for age - smooth & firm w/o nodules. Musculoskeletal: Full ROM all peripheral extremities, joint stability, 5/5 strength, and normal gait. Skin: Warm and dry without rashes, lesions, cyanosis, clubbing or  ecchymosis.  Neuro: Mild anomia. Cranial nerves intact, reflexes equal bilaterally. Normal muscle tone, no cerebellar symptoms. Sensation intact.  Pysch: Awake and oriented X 3 with flat affect, Poor insight and judgment is concrete and questionable.   Cognitive Testing  Alert? Yes  Normal Appearance? Yes  Oriented to person? Yes  Place? Yes   Time? Yes  Recall of three objects?  Yes  Can perform simple calculations? Yes  Displays appropriate judgment? questionable  Can read the correct time from a watch/clock? Yes  Medicare Attestation I have personally reviewed: The patient's medical and social history Their use of alcohol, tobacco or illicit drugs Their current medications and supplements The patient's functional ability including ADLs,fall risks, home safety risks, cognitive, and hearing and visual impairment Diet and physical activities Evidence for depression or mood disorders  The patient's weight, height, BMI, and visual acuity have been recorded in the chart.  I have made referrals, counseling, and provided education to the patient based on review of the above and I have provided the patient with a  written personalized care plan for preventive services.    Sidonia Nutter DAVID, MD   05/27/2014

## 2014-05-26 NOTE — Patient Instructions (Addendum)
Restart Minoxidil 10 mg at 1/5 tablet (=5 mg) daily   Please monitor BP's and bring list to office  ++++++++++++++++++++++++++++++++++++++++++++++++++++++++++  Recommend the book "The END of DIETING" by Dr Excell Seltzer   & the book "The END of DIABETES " by Dr Excell Seltzer  At [email or web address] - get book & Audio CD's     Being diabetic has a  300% increased risk for heart attack, stroke, cancer, and alzheimer- type vascular dementia. It is very important that you work harder with diet by avoiding all foods that are white. Avoid white rice (brown & wild rice is OK), white potatoes (sweetpotatoes in moderation is OK), White bread or wheat bread or anything made out of white flour like bagels, donuts, rolls, buns, biscuits, cakes, pastries, cookies, pizza crust, and pasta (made from white flour & egg whites) - vegetarian pasta or spinach or wheat pasta is OK. Multigrain breads like Arnold's or [employer], or multigrain sandwich thins or flatbreads.  Diet, exercise and weight loss can reverse and cure diabetes in the early stages.  Diet, exercise and weight loss is very important in the control and prevention of complications of diabetes which affects every system in your body, ie. Brain - dementia/stroke, eyes - glaucoma/blindness, heart - heart attack/heart failure, kidneys - dialysis, stomach - gastric paralysis, intestines - malabsorption, nerves - severe painful neuritis, circulation - gangrene & loss of a leg(s), and finally cancer and Alzheimers.    I recommend avoid fried & greasy foods,  sweets/candy, white rice (brown or wild rice or Quinoa is OK), white potatoes (sweet potatoes are OK) - anything made from white flour - bagels, doughnuts, rolls, buns, biscuits,white and wheat breads, pizza crust and traditional pasta made of white flour & egg white(vegetarian pasta or spinach or wheat pasta is OK).  Multi-grain bread is OK - like multi-grain flat bread or sandwich thins. Avoid alcohol in  excess. Exercise is also important.    Eat all the vegetables you want - avoid meat, especially red meat and dairy - especially cheese.  Cheese is the most concentrated form of trans-fats which is the worst thing to clog up our arteries. Veggie cheese is OK which can be found in the fresh produce section at [facility] or [employer] or [employer]  ++++++++++++++++++++++++++++++++++++++++++++++  Preventive Care for Adults A healthy lifestyle and preventive care can promote health and wellness. Preventive health guidelines for men include the following key practices:  A routine yearly physical is a good way to check with your health care provider about your health and preventative screening. It is a chance to share any concerns and updates on your health and to receive a thorough exam.  Visit your dentist for a routine exam and preventative care every 6 months. Brush your teeth twice a day and floss once a day. Good oral hygiene prevents tooth decay and gum disease.  The frequency of eye exams is based on your age, health, family medical history, use of contact lenses, and other factors. Follow your health care provider's recommendations for frequency of eye exams.  Eat a healthy diet. Foods such as vegetables, fruits, whole grains, low-fat dairy products, and lean protein foods contain the nutrients you need without too many calories. Decrease your intake of foods high in solid fats, added sugars, and salt. Eat the right amount of calories for you.Get information about a proper diet from your health care provider, if necessary.  Regular physical exercise is one of the most important things you  can do for your health. Most adults should get at least 150 minutes of moderate-intensity exercise (any activity that increases your heart rate and causes you to sweat) each week. In addition, most adults need muscle-strengthening exercises on 2 or more days a week.  Maintain a healthy weight. The body  mass index (BMI) is a screening tool to identify possible weight problems. It provides an estimate of body fat based on height and weight. Your health care provider can find your BMI and can help you achieve or maintain a healthy weight.For adults 20 years and older:  A BMI below 18.5 is considered underweight.  A BMI of 18.5 to 24.9 is normal.  A BMI of 25 to 29.9 is considered overweight.  A BMI of 30 and above is considered obese.  Maintain normal blood lipids and cholesterol levels by exercising and minimizing your intake of saturated fat. Eat a balanced diet with plenty of fruit and vegetables. Blood tests for lipids and cholesterol should begin at age 81 and be repeated every 5 years. If your lipid or cholesterol levels are high, you are over 50, or you are at high risk for heart disease, you may need your cholesterol levels checked more frequently.Ongoing high lipid and cholesterol levels should be treated with medicines if diet and exercise are not working.  If you smoke, find out from your health care provider how to quit. If you do not use tobacco, do not start.  Lung cancer screening is recommended for adults aged 54-80 years who are at high risk for developing lung cancer because of a history of smoking. A yearly low-dose CT scan of the lungs is recommended for people who have at least a 30-pack-year history of smoking and are a current smoker or have quit within the past 15 years. A pack year of smoking is smoking an average of 1 pack of cigarettes a day for 1 year (for example: 1 pack a day for 30 years or 2 packs a day for 15 years). Yearly screening should continue until the smoker has stopped smoking for at least 15 years. Yearly screening should be stopped for people who develop a health problem that would prevent them from having lung cancer treatment.  If you choose to drink alcohol, do not have more than 2 drinks per day. One drink is considered to be 12 ounces (355 mL) of  beer, 5 ounces (148 mL) of wine, or 1.5 ounces (44 mL) of liquor.  Avoid use of street drugs. Do not share needles with anyone. Ask for help if you need support or instructions about stopping the use of drugs.  High blood pressure causes heart disease and increases the risk of stroke. Your blood pressure should be checked at least every 1-2 years. Ongoing high blood pressure should be treated with medicines, if weight loss and exercise are not effective.  If you are 75-22 years old, ask your health care provider if you should take aspirin to prevent heart disease.  Diabetes screening involves taking a blood sample to check your fasting blood sugar level. Testing should be considered at a younger age or be carried out more frequently if you are overweight and have at least 1 risk factor for diabetes.  Colorectal cancer can be detected and often prevented. Most routine colorectal cancer screening begins at the age of 106 and continues through age 36. However, your health care provider may recommend screening at an earlier age if you have risk factors for colon cancer.  On a yearly basis, your health care provider may provide home test kits to check for hidden blood in the stool. Use of a small camera at the end of a tube to directly examine the colon (sigmoidoscopy or colonoscopy) can detect the earliest forms of colorectal cancer. Talk to your health care provider about this at age 74, when routine screening begins. Direct exam of the colon should be repeated every 5-10 years through age 11, unless early forms of precancerous polyps or small growths are found.  Hepatitis C blood testing is recommended for all people born from [birth year] through 1965 and any individual with known risks for hepatitis C.  Screening for abdominal aortic aneurysm (AAA)  by ultrasound is recommended for people who have history of high blood pressure or who are current or former smokers.  Healthy men should  receive  prostate-specific antigen (PSA) blood tests as part of routine cancer screening. Talk with your health care provider about prostate cancer screening.  Testicular cancer screening is  recommended for adult males. Screening includes self-exam, a health care provider exam, and other screening tests. Consult with your health care provider about any symptoms you have or any concerns you have about testicular cancer.  Use sunscreen. Apply sunscreen liberally and repeatedly throughout the day. You should seek shade when your shadow is shorter than you. Protect yourself by wearing long sleeves, pants, a wide-brimmed hat, and sunglasses year round, whenever you are outdoors.  Once a month, do a whole-body skin exam, using a mirror to look at the skin on your back. Tell your health care provider about new moles, moles that have irregular borders, moles that are larger than a pencil eraser, or moles that have changed in shape or color.  Stay current with required vaccines (immunizations).  Influenza vaccine. All adults should be immunized every year.  Tetanus, diphtheria, and acellular pertussis (Td, Tdap) vaccine. An adult who has not previously received Tdap or who does not know his vaccine status should receive 1 dose of Tdap. This initial dose should be followed by tetanus and diphtheria toxoids (Td) booster doses every 10 years. Adults with an unknown or incomplete history of completing a 3-dose immunization series with Td-containing vaccines should begin or complete a primary immunization series including a Tdap dose. Adults should receive a Td booster every 10 years.  Zoster vaccine. One dose is recommended for adults aged 63 years or older unless certain conditions are present.    PREVNAR - Pneumococcal 13-valent conjugate (PCV13) vaccine. When indicated, a person who is uncertain of his immunization history and has no record of immunization should receive the PCV13 vaccine. An adult aged 57 years or  older who has certain medical conditions and has not been previously immunized should receive 1 dose of PCV13 vaccine. This PCV13 should be followed with a dose of pneumococcal polysaccharide (PPSV23) vaccine. The PPSV23 vaccine dose should be obtained at least 8 weeks after the dose of PCV13 vaccine. An adult aged 68 years or older who has certain medical conditions and previously received 1 or more doses of PPSV23 vaccine should receive 1 dose of PCV13. The PCV13 vaccine dose should be obtained 1 or more years after the last PPSV23 vaccine dose.    PNEUMOVAX - Pneumococcal polysaccharide (PPSV23) vaccine. When PCV13 is also indicated, PCV13 should be obtained first. All adults aged 59 years and older should be immunized. An adult younger than age 33 years who has certain medical conditions should be immunized. Any person who  resides in a nursing home or long-term care facility should be immunized. An adult smoker should be immunized. People with an immunocompromised condition and certain other conditions should receive both PCV13 and PPSV23 vaccines. People with human immunodeficiency virus (HIV) infection should be immunized as soon as possible after diagnosis. Immunization during chemotherapy or radiation therapy should be avoided. Routine use of PPSV23 vaccine is not recommended for American Indians, [place]atives, or people younger than 65 years unless there are medical conditions that require PPSV23 vaccine. When indicated, people who have unknown immunization and have no record of immunization should receive PPSV23 vaccine. One-time revaccination 5 years after the first dose of PPSV23 is recommended for people aged 19-64 years who have chronic kidney failure, nephrotic syndrome, asplenia, or immunocompromised conditions. People who received 1-2 doses of PPSV23 before age 81 years should receive another dose of PPSV23 vaccine at age 8 years or later if at least 5 years have passed since the previous  dose. Doses of PPSV23 are not needed for people immunized with PPSV23 at or after age 82 years.    Hepatitis A vaccine. Adults who wish to be protected from this disease, have certain high-risk conditions, work with hepatitis A-infected animals, work in hepatitis A research labs, or travel to or work in countries with a high rate of hepatitis A should be immunized. Adults who were previously unvaccinated and who anticipate close contact with an international adoptee during the first 60 days after arrival in the [place] from a country with a high rate of hepatitis A should be immunized.    Hepatitis B vaccine. Adults should be immunized if they wish to be protected from this disease, have certain high-risk conditions, may be exposed to blood or other infectious body fluids, are household contacts or sex partners of hepatitis B positive people, are clients or workers in certain care facilities, or travel to or work in countries with a high rate of hepatitis B.   Preventive Service / Frequency   Ages 4 and over  Blood pressure check.  Lipid and cholesterol check.  Lung cancer screening. / Every year if you are aged 48-80 years and have a 30-pack-year history of smoking and currently smoke or have quit within the past 15 years. Yearly screening is stopped once you have quit smoking for at least 15 years or develop a health problem that would prevent you from having lung cancer treatment.  Fecal occult blood test (FOBT) of stool. You may not have to do this test if you get a colonoscopy every 10 years.  Flexible sigmoidoscopy** or colonoscopy.** / Every 5 years for a flexible sigmoidoscopy or every 10 years for a colonoscopy beginning at age 81 and continuing until age 29.  Hepatitis C blood test.** / For all people born from [birth year] through 1963 and any individual with known risks for hepatitis C.  Abdominal aortic aneurysm (AAA) screening./ Screening current or former smokers or have  Hypertension.  Skin self-exam. / Monthly.  Influenza vaccine. / Every year.  Tetanus, diphtheria, and acellular pertussis (Tdap/Td) vaccine.** / 1 dose of Td every 10 years.   Zoster vaccine.** / 1 dose for adults aged 61 years or older.         Pneumococcal 13-valent conjugate (PCV13) vaccine.    Pneumococcal polysaccharide (PPSV23) vaccine.     Hepatitis A vaccine.** / Consult your health care provider.  Hepatitis B vaccine.** / Consult your health care provider. Screening for abdominal aortic aneurysm (AAA)  by ultrasound  is recommended for people who have history of high blood pressure or who are current or former smokers.

## 2014-05-27 ENCOUNTER — Encounter: Payer: Self-pay | Admitting: Internal Medicine

## 2014-05-27 LAB — VITAMIN D 25 HYDROXY (VIT D DEFICIENCY, FRACTURES): Vit D, 25-Hydroxy: 73 ng/mL (ref 30–100)

## 2014-05-27 LAB — MICROALBUMIN / CREATININE URINE RATIO
Creatinine, Urine: 156.8 mg/dL
Microalb Creat Ratio: 33.2 mg/g — ABNORMAL HIGH (ref 0.0–30.0)
Microalb, Ur: 5.2 mg/dL — ABNORMAL HIGH (ref <2.0)

## 2014-05-27 LAB — URINALYSIS, MICROSCOPIC ONLY
Bacteria, UA: NONE SEEN
Casts: NONE SEEN
Crystals: NONE SEEN
Squamous Epithelial / LPF: NONE SEEN

## 2014-05-27 LAB — INSULIN, RANDOM: Insulin: 4.5 u[IU]/mL (ref 2.0–19.6)

## 2014-05-27 LAB — PSA: PSA: 2.98 ng/mL (ref <=4.00)

## 2014-06-08 ENCOUNTER — Encounter (HOSPITAL_COMMUNITY): Payer: Self-pay | Admitting: *Deleted

## 2014-06-08 ENCOUNTER — Emergency Department (HOSPITAL_COMMUNITY)
Admission: EM | Admit: 2014-06-08 | Discharge: 2014-06-08 | Disposition: A | Discharge status: Home / Self Care | Payer: PPO | Attending: Emergency Medicine | Admitting: Emergency Medicine

## 2014-06-08 ENCOUNTER — Emergency Department (HOSPITAL_COMMUNITY): Payer: PPO

## 2014-06-08 DIAGNOSIS — E559 Vitamin D deficiency, unspecified: Secondary | ICD-10-CM | POA: Insufficient documentation

## 2014-06-08 DIAGNOSIS — Z87891 Personal history of nicotine dependence: Secondary | ICD-10-CM | POA: Diagnosis not present

## 2014-06-08 DIAGNOSIS — R42 Dizziness and giddiness: Secondary | ICD-10-CM | POA: Diagnosis present

## 2014-06-08 DIAGNOSIS — Z79899 Other long term (current) drug therapy: Secondary | ICD-10-CM | POA: Diagnosis not present

## 2014-06-08 DIAGNOSIS — Z87448 Personal history of other diseases of urinary system: Secondary | ICD-10-CM | POA: Diagnosis not present

## 2014-06-08 DIAGNOSIS — M1990 Unspecified osteoarthritis, unspecified site: Secondary | ICD-10-CM | POA: Diagnosis not present

## 2014-06-08 DIAGNOSIS — R55 Syncope and collapse: Secondary | ICD-10-CM

## 2014-06-08 DIAGNOSIS — S0990XA Unspecified injury of head, initial encounter: Secondary | ICD-10-CM | POA: Diagnosis not present

## 2014-06-08 DIAGNOSIS — Z7982 Long term (current) use of aspirin: Secondary | ICD-10-CM | POA: Insufficient documentation

## 2014-06-08 DIAGNOSIS — Y92092 Bedroom in other non-institutional residence as the place of occurrence of the external cause: Secondary | ICD-10-CM | POA: Diagnosis not present

## 2014-06-08 DIAGNOSIS — Y998 Other external cause status: Secondary | ICD-10-CM | POA: Diagnosis not present

## 2014-06-08 DIAGNOSIS — Z7902 Long term (current) use of antithrombotics/antiplatelets: Secondary | ICD-10-CM | POA: Diagnosis not present

## 2014-06-08 DIAGNOSIS — W06XXXA Fall from bed, initial encounter: Secondary | ICD-10-CM | POA: Diagnosis not present

## 2014-06-08 DIAGNOSIS — I10 Essential (primary) hypertension: Secondary | ICD-10-CM | POA: Insufficient documentation

## 2014-06-08 DIAGNOSIS — Y9389 Activity, other specified: Secondary | ICD-10-CM | POA: Insufficient documentation

## 2014-06-08 DIAGNOSIS — W19XXXA Unspecified fall, initial encounter: Secondary | ICD-10-CM

## 2014-06-08 DIAGNOSIS — E785 Hyperlipidemia, unspecified: Secondary | ICD-10-CM | POA: Diagnosis not present

## 2014-06-08 DIAGNOSIS — Z8673 Personal history of transient ischemic attack (TIA), and cerebral infarction without residual deficits: Secondary | ICD-10-CM | POA: Insufficient documentation

## 2014-06-08 DIAGNOSIS — Z8719 Personal history of other diseases of the digestive system: Secondary | ICD-10-CM | POA: Diagnosis not present

## 2014-06-08 LAB — COMPREHENSIVE METABOLIC PANEL
ALBUMIN: 3.5 g/dL (ref 3.5–5.2)
ALT: 10 U/L (ref 0–53)
ANION GAP: 11 (ref 5–15)
AST: 13 U/L (ref 0–37)
Alkaline Phosphatase: 49 U/L (ref 39–117)
BUN: 38 mg/dL — AB (ref 6–23)
CALCIUM: 9.8 mg/dL (ref 8.4–10.5)
CO2: 26 mmol/L (ref 19–32)
CREATININE: 2.44 mg/dL — AB (ref 0.50–1.35)
Chloride: 105 mmol/L (ref 96–112)
GFR calc Af Amer: 28 mL/min — ABNORMAL LOW (ref >90)
GFR calc non Af Amer: 24 mL/min — ABNORMAL LOW (ref >90)
Glucose, Bld: 116 mg/dL — ABNORMAL HIGH (ref 70–99)
Potassium: 4 mmol/L (ref 3.5–5.1)
Sodium: 142 mmol/L (ref 135–145)
TOTAL PROTEIN: 6.4 g/dL (ref 6.0–8.3)
Total Bilirubin: 0.6 mg/dL (ref 0.3–1.2)

## 2014-06-08 LAB — DIFFERENTIAL
Basophils Absolute: 0 10*3/uL (ref 0.0–0.1)
Basophils Relative: 1 % (ref 0–1)
EOS PCT: 4 % (ref 0–5)
Eosinophils Absolute: 0.2 10*3/uL (ref 0.0–0.7)
LYMPHS ABS: 1.3 10*3/uL (ref 0.7–4.0)
Lymphocytes Relative: 27 % (ref 12–46)
MONOS PCT: 7 % (ref 3–12)
Monocytes Absolute: 0.3 10*3/uL (ref 0.1–1.0)
NEUTROS PCT: 61 % (ref 43–77)
Neutro Abs: 2.8 10*3/uL (ref 1.7–7.7)

## 2014-06-08 LAB — CBC
HEMATOCRIT: 39.4 % (ref 39.0–52.0)
HEMOGLOBIN: 13.4 g/dL (ref 13.0–17.0)
MCH: 28.8 pg (ref 26.0–34.0)
MCHC: 34 g/dL (ref 30.0–36.0)
MCV: 84.7 fL (ref 78.0–100.0)
Platelets: 135 10*3/uL — ABNORMAL LOW (ref 150–400)
RBC: 4.65 MIL/uL (ref 4.22–5.81)
RDW: 13.2 % (ref 11.5–15.5)
WBC: 4.6 10*3/uL (ref 4.0–10.5)

## 2014-06-08 LAB — I-STAT TROPONIN, ED: Troponin i, poc: 0.01 ng/mL (ref 0.00–0.08)

## 2014-06-08 LAB — APTT: aPTT: 32 seconds (ref 24–37)

## 2014-06-08 LAB — CBG MONITORING, ED: GLUCOSE-CAPILLARY: 102 mg/dL — AB (ref 70–99)

## 2014-06-08 LAB — PROTIME-INR
INR: 1.13 (ref 0.00–1.49)
Prothrombin Time: 14.6 seconds (ref 11.6–15.2)

## 2014-06-08 NOTE — ED Notes (Signed)
Ct ordered per verbal order of Quincy Carnes, PA.

## 2014-06-08 NOTE — ED Provider Notes (Addendum)
CSN: [id number]     Arrival date & time 06/08/14  1333 History   First MD Initiated Contact with Patient 06/08/14 1417     Chief Complaint  Patient presents with  . Fall    stroke-like symptoms  . Dizziness      HPI Family no that the patient fallen in the bathroom last night.  Patient has no recollection of why he fell.  He was able to get himself back into bed.  Primary care was called this morning because after the patient woke up at 10 AM the wife still thought he was slightly confused.  He was told to come to the ER.  Wife and patient report returned to baseline mental status this time.  Patient's been ambulatory without any difficulty this time.  He denies weakness of his arms or legs.  He reports ongoing dizziness.  He reports his been a persistent issue since his stroke.  His wife also reports that his had memory issues since his stroke.  Patient denies vertiginous symptoms.  He states that he is not on balance.  He states that he felt slightly lightheaded today.  Denies blood in his stool or black colored stool.  Denies nausea or vomiting.  Wife reports no diarrhea.  Appetite has been normal.  No fevers or chills.  Patient denies back pain or pain in his hips at this time.   Past Medical History  Diagnosis Date  . TIA (transient ischemic attack)   . Confusion   . H/O dizziness   . Arthritis   . GERD (gastroesophageal reflux disease)   . Hypertension   . Hyperlipidemia   . 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 Vitamin D deficiency   . Other testicular hypofunction    Past Surgical History  Procedure Laterality Date  . Hernia repair    . Back surgery    . 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 Anterior cervical decomp/discectomy fusion N/A 10/25/2012    Procedure: ANTERIOR CERVICAL DECOMPRESSION/DISCECTOMY FUSION CERVICAL THREE-FOUR 1 LEVEL/HARDWARE REMOVAL;  Surgeon: Ophelia Charter, MD;   Location: [facility];  Service: Neurosurgery;  Laterality: N/A;  Cervical three-four anterior cervical decompression with fusion interbody prothesis plating and bonegraft with removal of old Premier plate   Family History  Problem Relation Age of Onset  . Sudden death Mother   . Other Father    History  Substance Use Topics  . Smoking status: Former Smoker -- 1.00 packs/day for 30 years    Types: Cigarettes    Quit date: 03/12/1978  . Smokeless tobacco: Former Systems developer    Types: Chew    Quit date: 03/12/1978  . Alcohol Use: No    Review of Systems  All other systems reviewed and are negative.     Allergies  Review of patient's allergies indicates no known allergies.  Home Medications   Prior to Admission medications   Medication Sig Start Date End Date Taking? Authorizing Provider  amphetamine-dextroamphetamine (ADDERALL) 20 MG tablet Take 1/2 to 1 tablet up to twice daily as needed for alertness and depression 05/22/14  Yes Unk Pinto, MD  aspirin EC 81 MG tablet Take 81 mg by mouth daily.   Yes Historical Provider, MD  atenolol (TENORMIN) 100 MG tablet Take 1 tablet (100 mg total) by mouth daily. 03/28/14  Yes Unk Pinto, MD  Cholecalciferol (VITAMIN D-3) 5000 UNITS TABS Take 1  tablet by mouth 2 (two) times daily.   Yes Historical Provider, MD  clopidogrel (PLAVIX) 75 MG tablet TAKE 1 TABLET EVERY DAY 05/21/14  Yes Unk Pinto, MD  diphenhydramine-acetaminophen (TYLENOL PM) 25-500 MG TABS Take 1 tablet by mouth at bedtime as needed.   Yes Historical Provider, MD  doxazosin (CARDURA) 4 MG tablet Take 1 tablet (4 mg total) by mouth daily. 03/28/14  Yes Unk Pinto, MD  gemfibrozil (LOPID) 600 MG tablet Take 1 tablet (600 mg total) by mouth 2 (two) times daily before a meal. 03/28/14  Yes Unk Pinto, MD  metoprolol succinate (TOPROL-XL) 100 MG 24 hr tablet Take 100 mg by mouth daily. Take with or immediately following a meal.   Yes Historical Provider, MD   minoxidil (LONITEN) 10 MG tablet Take 1/2 to 1 tablet daily for BP as directed 05/26/14 11/26/14 Yes Unk Pinto, MD  QUEtiapine (SEROQUEL) 25 MG tablet TAKE 1 TABLET BY MOUTH TWICE A DAY FOR MOOD OR SLEEP 05/21/14  Yes Unk Pinto, MD  QUEtiapine (SEROQUEL) 25 MG tablet Take 25 mg by mouth at bedtime.   Yes Historical Provider, MD  sildenafil (VIAGRA) 100 MG tablet Take 1/2 to 1 tablet daily as needed for XXXX 12/01/13 12/02/14  Unk Pinto, MD   BP 140/71 mmHg  Pulse 79  Temp(Src) 98.5 F (36.9 C) (Oral)  Resp 20  Ht 6' (1.829 m)  SpO2 99% Physical Exam  Constitutional: He is oriented to person, place, and time. He appears well-developed and well-nourished.  HENT:  Head: Normocephalic and atraumatic.  Eyes: EOM are normal. Pupils are equal, round, and reactive to light.  Neck: Normal range of motion.  Cardiovascular: Normal rate, regular rhythm, normal heart sounds and intact distal pulses.   Pulmonary/Chest: Effort normal and breath sounds normal. No respiratory distress.  Abdominal: Soft. He exhibits no distension. There is no tenderness.  Musculoskeletal: Normal range of motion.  Neurological: He is alert and oriented to person, place, and time.  5/5 strength in major muscle groups of  bilateral upper and lower extremities. Speech normal. No facial asymetry.   Skin: Skin is warm and dry.  Psychiatric: He has a normal mood and affect. Judgment normal.  Nursing note and vitals reviewed.   ED Course  Procedures (including critical care time) Labs Review Labs Reviewed  CBC - Abnormal; Notable for the following:    Platelets 135 (*)    All other components within normal limits  COMPREHENSIVE METABOLIC PANEL - Abnormal; Notable for the following:    Glucose, Bld 116 (*)    BUN 38 (*)    Creatinine, Ser 2.44 (*)    GFR calc non Af Amer 24 (*)    GFR calc Af Amer 28 (*)    All other components within normal limits  PROTIME-INR  APTT  DIFFERENTIAL  CBG MONITORING,  ED  I-STAT TROPOININ, ED   BUN  Date Value Ref Range Status  06/08/2014 38* 6 - 23 mg/dL Final  05/26/2014 29* 6 - 23 mg/dL Final  09/27/2013 20 6 - 23 mg/dL Final  08/09/2013 31* 6 - 23 mg/dL Final   CREAT  Date Value Ref Range Status  05/26/2014 2.16* 0.50 - 1.35 mg/dL Final  09/27/2013 1.25 0.50 - 1.35 mg/dL Final  08/09/2013 1.50* 0.50 - 1.35 mg/dL Final  05/31/2013 1.25 0.50 - 1.35 mg/dL Final   CREATININE, SER  Date Value Ref Range Status  06/08/2014 2.44* 0.50 - 1.35 mg/dL Final  04/11/2013 1.33 0.50 - 1.35 mg/dL  Final  04/10/2013 1.70* 0.50 - 1.35 mg/dL Final  04/09/2013 1.71* 0.50 - 1.35 mg/dL Final       Imaging Review Ct Head (brain) Wo Contrast  06/08/2014   CLINICAL DATA:  Patient stood up to get out of bed and fell. Dizzy. Memory loss. Headache. Initial encounter.  EXAM: CT HEAD WITHOUT CONTRAST  TECHNIQUE: Contiguous axial images were obtained from the base of the skull through the vertex without intravenous contrast.  COMPARISON:  MR brain 05/15/2012.  CT head 05/14/2012.  FINDINGS: No evidence for acute infarction, hemorrhage, mass lesion, hydrocephalus, or extra-axial fluid. Moderately advanced cerebral and cerebellar atrophy. Remote infarctions of the LEFT thalamus, and RIGHT posterior temporal/occipital lobes. Hypoattenuation of white matter representing chronic microvascular ischemic change. Calvarium intact. No acute sinus or mastoid disease. Moderate vascular calcification. Similar appearance to priors.  IMPRESSION: No evidence for acute ischemia or posttraumatic sequelae. Chronic changes as described.   Electronically Signed   By: Rolla Flatten M.D.   On: 06/08/2014 14:46  I personally reviewed the imaging tests through PACS system I reviewed available ER/hospitalization records through the EMR    EKG Interpretation   Date/Time:  Thursday June 08 2014 13:49:01 EDT Ventricular Rate:  75 PR Interval:  168 QRS Duration: 88 QT Interval:  428 QTC  Calculation: 477 R Axis:   61 Text Interpretation:  Sinus rhythm with Premature supraventricular  complexes T wave abnormality, consider inferior ischemia Prolonged QT  Abnormal ECG nonspecific T wave changes as compared to prior ecg Confirmed  by Shailee Foots  MD, Lennette Bihari ([id number]) on 06/08/2014 2:31:02 PM      MDM   Final diagnoses:  Fall, initial encounter  Minor head injury, initial encounter  Syncope, unspecified syncope type    5:11 PM Patient feels better at this time.  Ambulatory in the emergency department.  May represent mild concussion and concussive like symptoms.  No focal abnormality.  Mild renal insufficiency consistent with priors.    Jola Schmidt, MD 06/08/14 [place], MD 06/08/14 [phone]

## 2014-06-08 NOTE — ED Notes (Signed)
Pt ambulated without difficulty in hall.

## 2014-06-08 NOTE — ED Notes (Signed)
Pt stood up to get out of bed and fell down on floor.  He was very dizzy, but was finally able to crawl to bathroom.  Alert per norm (hx of memory loss d/t previous strokes).  Headache all week per wife.

## 2014-06-09 ENCOUNTER — Encounter: Payer: Self-pay | Admitting: Internal Medicine

## 2014-06-09 ENCOUNTER — Ambulatory Visit (INDEPENDENT_AMBULATORY_CARE_PROVIDER_SITE_OTHER): Payer: PPO | Admitting: Physician Assistant

## 2014-06-09 VITALS — BP 136/70 | HR 76 | Temp 97.5°F | Resp 16 | Ht 69.75 in | Wt 187.0 lb

## 2014-06-09 DIAGNOSIS — I951 Orthostatic hypotension: Secondary | ICD-10-CM

## 2014-06-09 NOTE — Progress Notes (Signed)
Assessment and Plan: Confusional state/near syncope- questionable TIA but more likely hypotension due to medications- will stop the doxazosin for now, push fluids, continue ASA/Plavix. Follow up 1-2 weeks.  Urinary frequency- normal urine 05/26/2014, normal prostate exam, will monitor.   HPI 81 y.o.male with history of HTN, CVA x 2 (2012 and 2014) with sequela of dementia/memory, Chol, Depression presents for follow up from the ER. Wife is here and also helps with the history, patient does not drive but she is interested in him being able to make small trips. Patient went to the ER on 06/08/14, for:   Patient had to go to the bathroom at 5AM, fell from the bed onto his knees, got up and made it to the light about 12 feet, had incontinence at this time. Wife states when he turned the light on she woke up, he stated "both of my legs are weak."  She states he had some confusion afterwards with some garbled speech. She then helped him back to the bed and got dressed, he laid in bed for 3-4 hours, wife states he was still confused so she called the office and they were sent to the ER. The Ct head was negative for acute process. She mentions that he has also been complaining of a headache the last week and he has been complaining of dizziness worse with standing from a chair. He is on atenolol 100mg , cardura 4mg  QHS, minoxidil 10mg  at night, he is not on fluid pill but admits to not drinking a lot of fluid during the day. He has been having increased urination, no burning, hesitancy etc.  He is still on his plavix and ASA. Does not follow with neuro.   Echo 05/2012, carotid doppler 05/2012 both normal.   BP Readings from Last 3 Encounters:  06/09/14 136/70  06/08/14 146/84  05/26/14 158/96   Lab Results  Component Value Date   CREATININE 2.44* 06/08/2014   BUN 38* 06/08/2014   NA 142 06/08/2014   K 4.0 06/08/2014   CL 105 06/08/2014   CO2 26 06/08/2014    Ct Head (brain) Wo Contrast  06/08/2014    CLINICAL DATA:  Patient stood up to get out of bed and fell. Dizzy. Memory loss. Headache. Initial encounter.  EXAM: CT HEAD WITHOUT CONTRAST  TECHNIQUE: Contiguous axial images were obtained from the base of the skull through the vertex without intravenous contrast.  COMPARISON:  MR brain 05/15/2012.  CT head 05/14/2012.  FINDINGS: No evidence for acute infarction, hemorrhage, mass lesion, hydrocephalus, or extra-axial fluid. Moderately advanced cerebral and cerebellar atrophy. Remote infarctions of the LEFT thalamus, and RIGHT posterior temporal/occipital lobes. Hypoattenuation of white matter representing chronic microvascular ischemic change. Calvarium intact. No acute sinus or mastoid disease. Moderate vascular calcification. Similar appearance to priors.  IMPRESSION: No evidence for acute ischemia or posttraumatic sequelae. Chronic changes as described.   Electronically Signed   By: Rolla Flatten M.D.   On: 06/08/2014 14:46    Past Medical History  Diagnosis Date  . TIA (transient ischemic attack)   . Confusion   . H/O dizziness   . Arthritis   . GERD (gastroesophageal reflux disease)   . Hypertension   . Hyperlipidemia   . 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 Vitamin D deficiency   . Other testicular hypofunction      No Known Allergies    Current Outpatient Prescriptions on File Prior to Visit  Medication  Sig Dispense Refill  . amphetamine-dextroamphetamine (ADDERALL) 20 MG tablet Take 1/2 to 1 tablet up to twice daily as needed for alertness and depression 60 tablet 0  . aspirin EC 81 MG tablet Take 81 mg by mouth daily.    Marland Kitchen atenolol (TENORMIN) 100 MG tablet Take 1 tablet (100 mg total) by mouth daily. 30 tablet 0  . Cholecalciferol (VITAMIN D-3) 5000 UNITS TABS Take 1 tablet by mouth 2 (two) times daily.    . clopidogrel (PLAVIX) 75 MG tablet TAKE 1 TABLET EVERY DAY 30 tablet 0  . diphenhydramine-acetaminophen (TYLENOL PM)  25-500 MG TABS Take 1 tablet by mouth at bedtime as needed.    . doxazosin (CARDURA) 4 MG tablet Take 1 tablet (4 mg total) by mouth daily. 30 tablet 0  . gemfibrozil (LOPID) 600 MG tablet Take 1 tablet (600 mg total) by mouth 2 (two) times daily before a meal. 180 tablet 1  . metoprolol succinate (TOPROL-XL) 100 MG 24 hr tablet Take 100 mg by mouth daily. Take with or immediately following a meal.    . minoxidil (LONITEN) 10 MG tablet Take 1/2 to 1 tablet daily for BP as directed 90 tablet 1  . QUEtiapine (SEROQUEL) 25 MG tablet TAKE 1 TABLET BY MOUTH TWICE A DAY FOR MOOD OR SLEEP 60 tablet 0  . QUEtiapine (SEROQUEL) 25 MG tablet Take 25 mg by mouth at bedtime.    . sildenafil (VIAGRA) 100 MG tablet Take 1/2 to 1 tablet daily as needed for XXXX 10 tablet 00   No current facility-administered medications on file prior to visit.    ROS: all negative except above.   Physical Exam: Filed Weights   06/09/14 1227  Weight: 187 lb (84.823 kg)   BP 136/70 mmHg  Pulse 76  Temp(Src) 97.5 F (36.4 C)  Resp 16  Ht 5' 9.75" (1.772 m)  Wt 187 lb (84.823 kg)  BMI 27.01 kg/m2  Constitutional: He appears well-developed and well-nourished.   Normocephalic and atraumatic.  Right Ear: External ear normal.  Left Ear: External ear normal.  Eyes: Conjunctivae and EOM are normal. Pupils are equal, round, and reactive to light.  Neck: Normal range of motion. Neck supple.  Cardiovascular: Normal rate and regular rhythm.  Murmur (2/6 systolic murmur) heard.  Pulmonary/Chest: Effort normal and breath sounds normal.  Abdominal: Soft. Bowel sounds are normal. There is no tenderness.  Musculoskeletal: Normal range of motion.  Neurological: He is alert. He has normal strength except 4/5 in left arm, no pronator drift. No cranial nerve deficit or sensory deficit. He displays a positive Romberg sign. Coordination and gait normal.  Prostate: DRE with Dr. Melford Aase, firm, small, nontender prostate Psych: Flat  affects, poor concentration/short term memory.  Skin: Skin is warm and dry. No rash noted.   Vicie Mutters, PA-C 12:38 PM [facility]

## 2014-06-09 NOTE — Patient Instructions (Signed)
Please stop the cardura for now Call if you have any difficulty urinating.  Follow up 1-2 weeks.   Please increase fluids!!  Please monitor your blood pressure, as we get older our body can not respond to a low blood pressure as well as it did when we were younger, for this reason we want a bit higher of a blood pressure as you get older to avoid dizziness and fatigue which can lead to falls. Pease call if your blood pressure is consistently above 150/90.   Please monitor your blood pressure. If it is getting below 130/80 AND you are having fatigue with exertion, dizziness we may need to cut your blood pressure medication in half. Please call the office if this is happening. Hypotension As your heart beats, it forces blood through your body. This force is called blood pressure. If you have hypotension, you have low blood pressure. When your blood pressure is too low, you may not get enough blood to your brain. You may feel weak, feel lightheaded, have a fast heartbeat, or even pass out (faint). HOME CARE  Drink enough fluids to keep your pee (urine) clear or pale yellow.  Take all medicines as told by your doctor.  Get up slowly after sitting or lying down.  Wear support stockings as told by your doctor.  Maintain a healthy diet by including foods such as fruits, vegetables, nuts, whole grains, and lean meats. GET HELP IF:  You are throwing up (vomiting) or have watery poop (diarrhea).  You have a fever for more than 2-3 days.  You feel more thirsty than usual.  You feel weak and tired. GET HELP RIGHT AWAY IF:   You pass out (faint).  You have chest pain or a fast or irregular heartbeat.  You lose feeling in part of your body.  You cannot move your arms or legs.  You have trouble speaking.  You get sweaty or feel lightheaded. MAKE SURE YOU:   Understand these instructions.  Will watch your condition.  Will get help right away if you are not doing well or get  worse. Document Released: 05/21/2009 Document Revised: 10/27/2012 Document Reviewed: 08/27/2012 [facility] Patient Information 2015 [place], [place]. This information is not intended to replace advice given to you by your health care provider. Make sure you discuss any questions you have with your health care provider.

## 2014-06-13 ENCOUNTER — Other Ambulatory Visit: Payer: Self-pay | Admitting: Internal Medicine

## 2014-06-13 DIAGNOSIS — E349 Endocrine disorder, unspecified: Secondary | ICD-10-CM

## 2014-06-13 MED ORDER — TESTOSTERONE CYPIONATE 200 MG/ML IM SOLN: INTRAMUSCULAR | Status: DC

## 2014-06-15 ENCOUNTER — Other Ambulatory Visit (INDEPENDENT_AMBULATORY_CARE_PROVIDER_SITE_OTHER): Payer: PPO

## 2014-06-15 DIAGNOSIS — Z1212 Encounter for screening for malignant neoplasm of rectum: Secondary | ICD-10-CM

## 2014-06-15 LAB — POC HEMOCCULT BLD/STL (HOME/3-CARD/SCREEN)
Card #2 Fecal Occult Blod, POC: NEGATIVE
Card #3 Fecal Occult Blood, POC: NEGATIVE
Fecal Occult Blood, POC: NEGATIVE

## 2014-06-27 ENCOUNTER — Telehealth: Payer: Self-pay | Admitting: Internal Medicine

## 2014-06-27 NOTE — Telephone Encounter (Signed)
Lance Ramirez called requesting a referral for [facility] to see another neurologist at [facility], last appointment 2015. She said you discussed this at his last office visit.  Please advise me.  Thank you, Katrina Judeth Horn [facility] [phone] Fax [phone]

## 2014-06-29 NOTE — Telephone Encounter (Signed)
Spoke with Hinton Dyer at [facility], to request appointment for patient with another provider in their practice.  Hinton Dyer to call back to advise.  Thank you, Katrina Judeth Horn [facility] [phone] Fax [phone]

## 2014-07-03 ENCOUNTER — Other Ambulatory Visit: Payer: Self-pay | Admitting: Internal Medicine

## 2014-07-03 DIAGNOSIS — F329 Major depressive disorder, single episode, unspecified: Secondary | ICD-10-CM

## 2014-07-03 DIAGNOSIS — F32A Depression, unspecified: Secondary | ICD-10-CM

## 2014-07-03 MED ORDER — AMPHETAMINE-DEXTROAMPHETAMINE 20 MG PO TABS: ORAL_TABLET | ORAL | Status: DC

## 2014-08-28 ENCOUNTER — Other Ambulatory Visit: Payer: Self-pay | Admitting: *Deleted

## 2014-08-28 DIAGNOSIS — F329 Major depressive disorder, single episode, unspecified: Secondary | ICD-10-CM

## 2014-08-28 DIAGNOSIS — F32A Depression, unspecified: Secondary | ICD-10-CM

## 2014-08-28 MED ORDER — AMPHETAMINE-DEXTROAMPHETAMINE 20 MG PO TABS: ORAL_TABLET | ORAL | Status: DC

## 2014-09-01 ENCOUNTER — Ambulatory Visit (INDEPENDENT_AMBULATORY_CARE_PROVIDER_SITE_OTHER): Payer: PPO | Admitting: Internal Medicine

## 2014-09-01 ENCOUNTER — Encounter: Payer: Self-pay | Admitting: Internal Medicine

## 2014-09-01 VITALS — BP 138/64 | HR 102 | Temp 98.4°F | Resp 18 | Ht 69.75 in | Wt 194.0 lb

## 2014-09-01 DIAGNOSIS — E669 Obesity, unspecified: Secondary | ICD-10-CM

## 2014-09-01 DIAGNOSIS — R7309 Other abnormal glucose: Secondary | ICD-10-CM

## 2014-09-01 DIAGNOSIS — R7303 Prediabetes: Secondary | ICD-10-CM

## 2014-09-01 DIAGNOSIS — I10 Essential (primary) hypertension: Secondary | ICD-10-CM

## 2014-09-01 DIAGNOSIS — Z79899 Other long term (current) drug therapy: Secondary | ICD-10-CM

## 2014-09-01 DIAGNOSIS — E785 Hyperlipidemia, unspecified: Secondary | ICD-10-CM

## 2014-09-01 DIAGNOSIS — E559 Vitamin D deficiency, unspecified: Secondary | ICD-10-CM

## 2014-09-01 LAB — CBC WITH DIFFERENTIAL/PLATELET
BASOS ABS: 0 10*3/uL (ref 0.0–0.1)
Basophils Relative: 1 % (ref 0–1)
EOS PCT: 3 % (ref 0–5)
Eosinophils Absolute: 0.1 10*3/uL (ref 0.0–0.7)
HCT: 40.1 % (ref 39.0–52.0)
HEMOGLOBIN: 13.4 g/dL (ref 13.0–17.0)
LYMPHS ABS: 1.6 10*3/uL (ref 0.7–4.0)
LYMPHS PCT: 33 % (ref 12–46)
MCH: 28.5 pg (ref 26.0–34.0)
MCHC: 33.4 g/dL (ref 30.0–36.0)
MCV: 85.1 fL (ref 78.0–100.0)
MPV: 10.3 fL (ref 8.6–12.4)
Monocytes Absolute: 0.4 10*3/uL (ref 0.1–1.0)
Monocytes Relative: 9 % (ref 3–12)
NEUTROS PCT: 54 % (ref 43–77)
Neutro Abs: 2.5 10*3/uL (ref 1.7–7.7)
Platelets: 192 10*3/uL (ref 150–400)
RBC: 4.71 MIL/uL (ref 4.22–5.81)
RDW: 16.9 % — ABNORMAL HIGH (ref 11.5–15.5)
WBC: 4.7 10*3/uL (ref 4.0–10.5)

## 2014-09-01 LAB — HEMOGLOBIN A1C
HEMOGLOBIN A1C: 5.9 % — AB (ref <5.7)
Mean Plasma Glucose: 123 mg/dL — ABNORMAL HIGH (ref <117)

## 2014-09-01 MED ORDER — ALPRAZOLAM 1 MG PO TABS: ORAL_TABLET | ORAL | Status: DC

## 2014-09-01 NOTE — Patient Instructions (Signed)

## 2014-09-01 NOTE — Progress Notes (Signed)
Patient ID: Lance Ramirez, male   DOB: 1933/10/06, 81 y.o.   MRN: [id number]  Assessment and Plan:  Hypertension:  -Continue medication,  -monitor blood pressure at home.  -Continue DASH diet.   -Reminder to go to the ER if any CP, SOB, nausea, dizziness, severe HA, changes vision/speech, left arm numbness and tingling, and jaw pain.  Cholesterol: -likely needs to be on a statin due to cholesterol and history of multiple CVA will discuss with Dr. Melford Aase. -Continue diet and exercise.  -Check cholesterol.   Pre-diabetes: -Continue diet and exercise.  -Check A1C  Vitamin D Def: -check level -continue medications.   History of CVA -follow up with neuro on Monday -chronic dizziness due to previous strokes with some dementia -consider doing some PT    Continue diet and meds as discussed. Further disposition pending results of labs.  HPI 81 y.o. male  presents for 3 month follow up with hypertension, hyperlipidemia, prediabetes and vitamin D.   His blood pressure has been controlled at home, today their BP is BP: 138/64 mmHg.   He does not workout. He denies chest pain, shortness of breath, dizziness.   He is on cholesterol medication and denies myalgias. His cholesterol is at goal. The cholesterol last visit was:   Lab Results  Component Value Date   CHOL 172 05/26/2014   HDL 43 05/26/2014   LDLCALC 116* 05/26/2014   TRIG 65 05/26/2014   CHOLHDL 4.0 05/26/2014     He has not been working on diet and exercise for prediabetes, and denies foot ulcerations, hyperglycemia, hypoglycemia , increased appetite, nausea, paresthesia of the feet, polydipsia, polyuria, visual disturbances, vomiting and weight loss. Last A1C in the office was:  Lab Results  Component Value Date   HGBA1C 6.0* 05/26/2014    Patient is on Vitamin D supplement.  Lab Results  Component Value Date   VD25OH 63 05/26/2014     Patient does report that he has had a lot of dizziness which prevents him from  doing any exercise or doing stuff.  This has been a long ongoing issue.  His last testosterone injection was approximately 2 weeks ago.    His wife does report that he is having a lot bleeding issues.  He bit his cheek and they had a hard time getting bleeding to stop.  He also had a very hard time with bleeding after a testosterone injection.     Current Medications:  Current Outpatient Prescriptions on File Prior to Visit  Medication Sig Dispense Refill  . amphetamine-dextroamphetamine (ADDERALL) 20 MG tablet Take 1/2 to 1 tablet up to twice daily as needed for alertness and depression 60 tablet 0  . aspirin EC 81 MG tablet Take 81 mg by mouth daily.    Marland Kitchen atenolol (TENORMIN) 100 MG tablet Take 1 tablet (100 mg total) by mouth daily. 30 tablet 0  . Cholecalciferol (VITAMIN D-3) 5000 UNITS TABS Take 1 tablet by mouth 2 (two) times daily.    . clopidogrel (PLAVIX) 75 MG tablet TAKE 1 TABLET EVERY DAY 30 tablet 0  . diphenhydramine-acetaminophen (TYLENOL PM) 25-500 MG TABS Take 1 tablet by mouth at bedtime as needed.    . doxazosin (CARDURA) 4 MG tablet Take 1 tablet (4 mg total) by mouth daily. 30 tablet 0  . gemfibrozil (LOPID) 600 MG tablet Take 1 tablet (600 mg total) by mouth 2 (two) times daily before a meal. 180 tablet 1  . metoprolol succinate (TOPROL-XL) 100 MG 24 hr tablet Take  100 mg by mouth daily. Take with or immediately following a meal.    . minoxidil (LONITEN) 10 MG tablet Take 1/2 to 1 tablet daily for BP as directed 90 tablet 1  . QUEtiapine (SEROQUEL) 25 MG tablet TAKE 1 TABLET BY MOUTH TWICE A DAY FOR MOOD OR SLEEP 60 tablet 0  . QUEtiapine (SEROQUEL) 25 MG tablet Take 25 mg by mouth at bedtime.    . sildenafil (VIAGRA) 100 MG tablet Take 1/2 to 1 tablet daily as needed for XXXX 10 tablet 00  . testosterone cypionate (DEPOTESTOTERONE CYPIONATE) 200 MG/ML injection Inject 2 ml every 2 weeks or as directed 10 mL 3   No current facility-administered medications on file prior  to visit.    Medical History:  Past Medical History  Diagnosis Date  . TIA (transient ischemic attack)   . Confusion   . H/O dizziness   . Arthritis   . GERD (gastroesophageal reflux disease)   . Hypertension   . Hyperlipidemia   . 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 Vitamin D deficiency   . Other testicular hypofunction     Allergies: No Known Allergies   Review of Systems:  Review of Systems  Constitutional: Positive for malaise/fatigue. Negative for fever and chills.  HENT: Negative for congestion, ear pain, nosebleeds and sore throat.   Respiratory: Positive for shortness of breath (with activity). Negative for cough and wheezing.   Cardiovascular: Positive for leg swelling. Negative for chest pain and palpitations.  Gastrointestinal: Negative for heartburn, nausea, vomiting, diarrhea, constipation, blood in stool and melena.  Genitourinary: Positive for urgency and frequency. Negative for dysuria.  Skin: Negative.   Neurological: Positive for dizziness, focal weakness (left sided deficits from prior stroke) and headaches. Negative for sensory change and loss of consciousness.  Psychiatric/Behavioral: Negative for depression. The patient is not nervous/anxious and does not have insomnia.     Family history- Review and unchanged  Social history- Review and unchanged  Physical Exam: BP 138/64 mmHg  Pulse 102  Temp(Src) 98.4 F (36.9 C) (Temporal)  Resp 18  Ht 5' 9.75" (1.772 m)  Wt 194 lb (87.998 kg)  BMI 28.02 kg/m2 Wt Readings from Last 3 Encounters:  09/01/14 194 lb (87.998 kg)  06/09/14 187 lb (84.823 kg)  05/26/14 191 lb 12.8 oz (87 kg)    General Appearance: Well nourished well developed, in no apparent distress. Eyes: PERRLA, EOMs, conjunctiva no swelling or erythema ENT/Mouth: Ear canals normal without obstruction, swelling, erythma, discharge.  TMs normal bilaterally.  Oropharynx moist, clear,  without exudate, or postoropharyngeal swelling. Neck: Supple, thyroid normal,no cervical adenopathy  Respiratory: Respiratory effort normal, Breath sounds clear A&P without rhonchi, wheeze, or rale.  No retractions, no accessory usage. Cardio: RRR with no MRGs. Brisk peripheral pulses without edema.  Abdomen: Soft, + BS,  Non tender, no guarding, rebound, hernias, masses. Musculoskeletal: Full ROM, 5/5 strength, Mildly antalgic gait Skin: Warm, dry without rashes, lesions, ecchymosis.  Neuro: Awake and oriented X 3, Cranial nerves intact. Mild left sided strength and sensation deficits in upper extremity.   Psych: Normal affect, poor short term memory, asks questions multiple times, Insight and Judgment appropriate.    FORCUCCI, Ailin Rochford, PA-C 10:44 AM [facility]

## 2014-09-02 LAB — LIPID PANEL
CHOL/HDL RATIO: 5 ratio
CHOLESTEROL: 229 mg/dL — AB (ref 0–200)
HDL: 46 mg/dL (ref >=40)
LDL CALC: 165 mg/dL — AB (ref 0–99)
TRIGLYCERIDES: 91 mg/dL (ref <150)
VLDL: 18 mg/dL (ref 0–40)

## 2014-09-02 LAB — TSH: TSH: 3.912 u[IU]/mL (ref 0.350–4.500)

## 2014-09-02 LAB — BASIC METABOLIC PANEL WITH GFR
BUN: 20 mg/dL (ref 6–23)
CALCIUM: 10 mg/dL (ref 8.4–10.5)
CHLORIDE: 107 meq/L (ref 96–112)
CO2: 27 meq/L (ref 19–32)
CREATININE: 1.79 mg/dL — AB (ref 0.50–1.35)
GFR, Est African American: 41 mL/min — ABNORMAL LOW
GFR, Est Non African American: 36 mL/min — ABNORMAL LOW
GLUCOSE: 128 mg/dL — AB (ref 70–99)
Potassium: 4.5 mEq/L (ref 3.5–5.3)
Sodium: 147 mEq/L — ABNORMAL HIGH (ref 135–145)

## 2014-09-02 LAB — HEPATIC FUNCTION PANEL
ALT: 9 U/L (ref 0–53)
AST: 16 U/L (ref 0–37)
Albumin: 4.1 g/dL (ref 3.5–5.2)
Alkaline Phosphatase: 46 U/L (ref 39–117)
Bilirubin, Direct: 0.1 mg/dL (ref 0.0–0.3)
Indirect Bilirubin: 0.4 mg/dL (ref 0.2–1.2)
Total Bilirubin: 0.5 mg/dL (ref 0.2–1.2)
Total Protein: 6.6 g/dL (ref 6.0–8.3)

## 2014-09-02 LAB — MAGNESIUM: MAGNESIUM: 2.1 mg/dL (ref 1.5–2.5)

## 2014-09-02 LAB — INSULIN, RANDOM: Insulin: 20.8 u[IU]/mL — ABNORMAL HIGH (ref 2.0–19.6)

## 2014-09-02 LAB — VITAMIN D 25 HYDROXY (VIT D DEFICIENCY, FRACTURES): Vit D, 25-Hydroxy: 67 ng/mL (ref 30–100)

## 2014-09-04 ENCOUNTER — Ambulatory Visit: Payer: PPO | Admitting: Neurology

## 2014-09-04 ENCOUNTER — Other Ambulatory Visit: Payer: Self-pay | Admitting: Internal Medicine

## 2014-09-04 MED ORDER — ATORVASTATIN CALCIUM 80 MG PO TABS: ORAL_TABLET | ORAL | Status: DC

## 2014-09-04 NOTE — Progress Notes (Signed)
Patients wife aware

## 2014-09-29 ENCOUNTER — Other Ambulatory Visit: Payer: Self-pay | Admitting: Internal Medicine

## 2014-09-29 ENCOUNTER — Encounter: Payer: Self-pay | Admitting: Internal Medicine

## 2014-09-29 ENCOUNTER — Telehealth: Payer: Self-pay | Admitting: *Deleted

## 2014-09-29 NOTE — Telephone Encounter (Signed)
Spouse called to confirm the patient's Atorvastation dose.  Per Dr Melford Aase, take 1/2 tab 3 times a week.  Patient states his legs feel weak and will call back if the reduced dose of med does not relieve the weak feeling.

## 2014-10-09 ENCOUNTER — Other Ambulatory Visit: Payer: Self-pay | Admitting: Internal Medicine

## 2014-10-17 ENCOUNTER — Other Ambulatory Visit: Payer: Self-pay | Admitting: *Deleted

## 2014-10-17 MED ORDER — CLOPIDOGREL BISULFATE 75 MG PO TABS: 75.0000 mg | ORAL_TABLET | Freq: Every day | ORAL | Status: DC

## 2014-10-17 MED ORDER — AMPHETAMINE-DEXTROAMPHETAMINE 20 MG PO TABS: ORAL_TABLET | ORAL | Status: DC

## 2014-10-17 MED ORDER — ATENOLOL 100 MG PO TABS: 100.0000 mg | ORAL_TABLET | Freq: Every day | ORAL | Status: DC

## 2014-11-13 ENCOUNTER — Other Ambulatory Visit: Payer: Self-pay | Admitting: Physician Assistant

## 2014-11-21 ENCOUNTER — Other Ambulatory Visit: Payer: Self-pay | Admitting: *Deleted

## 2014-11-21 MED ORDER — AMPHETAMINE-DEXTROAMPHETAMINE 20 MG PO TABS: 20.0000 mg | ORAL_TABLET | Freq: Two times a day (BID) | ORAL | Status: DC

## 2014-12-08 ENCOUNTER — Encounter: Payer: Self-pay | Admitting: Physician Assistant

## 2014-12-08 ENCOUNTER — Ambulatory Visit (INDEPENDENT_AMBULATORY_CARE_PROVIDER_SITE_OTHER): Payer: PPO | Admitting: Physician Assistant

## 2014-12-08 VITALS — BP 140/60 | HR 66 | Temp 97.5°F | Resp 14 | Ht 69.75 in | Wt 193.6 lb

## 2014-12-08 DIAGNOSIS — R296 Repeated falls: Secondary | ICD-10-CM | POA: Diagnosis not present

## 2014-12-08 DIAGNOSIS — R7309 Other abnormal glucose: Secondary | ICD-10-CM

## 2014-12-08 DIAGNOSIS — E559 Vitamin D deficiency, unspecified: Secondary | ICD-10-CM | POA: Diagnosis not present

## 2014-12-08 DIAGNOSIS — R42 Dizziness and giddiness: Secondary | ICD-10-CM

## 2014-12-08 DIAGNOSIS — I10 Essential (primary) hypertension: Secondary | ICD-10-CM

## 2014-12-08 DIAGNOSIS — R7303 Prediabetes: Secondary | ICD-10-CM

## 2014-12-08 DIAGNOSIS — R2689 Other abnormalities of gait and mobility: Secondary | ICD-10-CM

## 2014-12-08 DIAGNOSIS — R413 Other amnesia: Secondary | ICD-10-CM | POA: Diagnosis not present

## 2014-12-08 DIAGNOSIS — E785 Hyperlipidemia, unspecified: Secondary | ICD-10-CM | POA: Diagnosis not present

## 2014-12-08 DIAGNOSIS — E669 Obesity, unspecified: Secondary | ICD-10-CM | POA: Diagnosis not present

## 2014-12-08 DIAGNOSIS — Z79899 Other long term (current) drug therapy: Secondary | ICD-10-CM | POA: Diagnosis not present

## 2014-12-08 DIAGNOSIS — Z9181 History of falling: Secondary | ICD-10-CM

## 2014-12-08 LAB — HEPATIC FUNCTION PANEL
ALBUMIN: 3.9 g/dL (ref 3.6–5.1)
ALK PHOS: 47 U/L (ref 40–115)
ALT: 12 U/L (ref 9–46)
AST: 13 U/L (ref 10–35)
Bilirubin, Direct: 0.1 mg/dL (ref <=0.2)
Indirect Bilirubin: 0.3 mg/dL (ref 0.2–1.2)
TOTAL PROTEIN: 5.9 g/dL — AB (ref 6.1–8.1)
Total Bilirubin: 0.4 mg/dL (ref 0.2–1.2)

## 2014-12-08 LAB — CBC WITH DIFFERENTIAL/PLATELET
BASOS ABS: 0 10*3/uL (ref 0.0–0.1)
Basophils Relative: 0 % (ref 0–1)
EOS PCT: 3 % (ref 0–5)
Eosinophils Absolute: 0.2 10*3/uL (ref 0.0–0.7)
HEMATOCRIT: 43.4 % (ref 39.0–52.0)
Hemoglobin: 14.7 g/dL (ref 13.0–17.0)
LYMPHS ABS: 1.5 10*3/uL (ref 0.7–4.0)
LYMPHS PCT: 30 % (ref 12–46)
MCH: 29.2 pg (ref 26.0–34.0)
MCHC: 33.9 g/dL (ref 30.0–36.0)
MCV: 86.1 fL (ref 78.0–100.0)
MPV: 11.3 fL (ref 8.6–12.4)
Monocytes Absolute: 0.5 10*3/uL (ref 0.1–1.0)
Monocytes Relative: 9 % (ref 3–12)
Neutro Abs: 3 10*3/uL (ref 1.7–7.7)
Neutrophils Relative %: 58 % (ref 43–77)
Platelets: 153 10*3/uL (ref 150–400)
RBC: 5.04 MIL/uL (ref 4.22–5.81)
RDW: 14.5 % (ref 11.5–15.5)
WBC: 5.1 10*3/uL (ref 4.0–10.5)

## 2014-12-08 LAB — BASIC METABOLIC PANEL WITH GFR
BUN: 21 mg/dL (ref 7–25)
CHLORIDE: 107 mmol/L (ref 98–110)
CO2: 30 mmol/L (ref 20–31)
Calcium: 9 mg/dL (ref 8.6–10.3)
Creat: 1.71 mg/dL — ABNORMAL HIGH (ref 0.70–1.18)
GFR, Est African American: 43 mL/min — ABNORMAL LOW (ref >=60)
GFR, Est Non African American: 38 mL/min — ABNORMAL LOW (ref >=60)
GLUCOSE: 105 mg/dL — AB (ref 65–99)
Potassium: 4 mmol/L (ref 3.5–5.3)
Sodium: 143 mmol/L (ref 135–146)

## 2014-12-08 LAB — LIPID PANEL
Cholesterol: 167 mg/dL (ref 125–200)
HDL: 52 mg/dL (ref >=40)
LDL CALC: 103 mg/dL (ref <130)
Total CHOL/HDL Ratio: 3.2 Ratio (ref <=5.0)
Triglycerides: 61 mg/dL (ref <150)
VLDL: 12 mg/dL (ref <30)

## 2014-12-08 LAB — MAGNESIUM: MAGNESIUM: 2.2 mg/dL (ref 1.5–2.5)

## 2014-12-08 LAB — TSH: TSH: 3.497 u[IU]/mL (ref 0.350–4.500)

## 2014-12-08 LAB — HEMOGLOBIN A1C
HEMOGLOBIN A1C: 6 % — AB (ref <5.7)
Mean Plasma Glucose: 126 mg/dL — ABNORMAL HIGH (ref <117)

## 2014-12-08 NOTE — Progress Notes (Signed)
Assessment and Plan:  1. Hypertension -Continue medication, monitor blood pressure at home. Continue DASH diet.  Reminder to go to the ER if any CP, SOB, nausea, dizziness, severe HA, changes vision/speech, left arm numbness and tingling and jaw pain.  2. Cholesterol -Continue diet and exercise. Check cholesterol.   3. Prediabetes  -Continue diet and exercise. Check A1C  4. Vitamin D Def - check level and continue medications.   5. High fall risk Will refer for PT  Continue diet and meds as discussed. Further disposition pending results of labs. Over 30 minutes of exam, counseling, chart review, and critical decision making was performed  HPI 81 y.o. male  presents for 3 month follow up on hypertension, cholesterol, prediabetes, and vitamin D deficiency.   His blood pressure has been controlled at home, today their BP is BP: 140/60 mmHg  He does not workout. He denies chest pain, shortness of breath.  He does have dizziness with walking that prevents exercise, not any worse, not with standing, no vision changes, HAs. No falls.  He has a history of CVA/TIA with some memory issues, on plavix, now on statin.   He is not on cholesterol medication, he is off gemfibrozil and on lipitor 1/2 a pill 3 days a week and denies myalgias. His cholesterol is not at goal. The cholesterol last visit was:   Lab Results  Component Value Date   CHOL 229* 09/01/2014   HDL 46 09/01/2014   LDLCALC 165* 09/01/2014   TRIG 91 09/01/2014   CHOLHDL 5.0 09/01/2014    He has been working on diet and exercise for prediabetes, and denies paresthesia of the feet, polydipsia, polyuria and visual disturbances. Last A1C in the office was:  Lab Results  Component Value Date   HGBA1C 5.9* 09/01/2014   Patient is on Vitamin D supplement.   Lab Results  Component Value Date   VD25OH 67 09/01/2014     He has a history of testosterone deficiency and is on testosterone replacement. He states that the testosterone  helps with his energy, libido, muscle mass.   Current Medications:  Current Outpatient Prescriptions on File Prior to Visit  Medication Sig Dispense Refill  . ALPRAZolam (XANAX) 1 MG tablet TAKE 1/2-1 TABLET BY MOUTH AT BEDTIME AS NEEDED 30 tablet 5  . amphetamine-dextroamphetamine (ADDERALL) 20 MG tablet Take 1 tablet (20 mg total) by mouth 2 (two) times daily. 60 tablet 0  . aspirin EC 81 MG tablet Take 81 mg by mouth daily.    Marland Kitchen atenolol (TENORMIN) 100 MG tablet Take 1 tablet (100 mg total) by mouth daily. 90 tablet 4  . atorvastatin (LIPITOR) 80 MG tablet Take 1/2 to 1 tablet daily at dinner time 30 tablet 11  . Cholecalciferol (VITAMIN D-3) 5000 UNITS TABS Take 1 tablet by mouth 2 (two) times daily.    . clopidogrel (PLAVIX) 75 MG tablet Take 1 tablet (75 mg total) by mouth daily. 90 tablet 3  . diphenhydramine-acetaminophen (TYLENOL PM) 25-500 MG TABS Take 1 tablet by mouth at bedtime as needed.    . doxazosin (CARDURA) 4 MG tablet Take 1 tablet (4 mg total) by mouth daily. 30 tablet 0  . testosterone cypionate (DEPOTESTOSTERONE CYPIONATE) 200 MG/ML injection INJECT 2 ML EVERY 2 WEEKS OR AS DIRECTED 10 mL 3  . sildenafil (VIAGRA) 100 MG tablet Take 1/2 to 1 tablet daily as needed for XXXX 10 tablet 00   No current facility-administered medications on file prior to visit.   Medical  History:  Past Medical History  Diagnosis Date  . TIA (transient ischemic attack)   . Confusion   . H/O dizziness   . Arthritis   . GERD (gastroesophageal reflux disease)   . Hypertension   . Hyperlipidemia   . 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 Vitamin D deficiency   . Other testicular hypofunction    Allergies: No Known Allergies   Review of Systems:  Review of Systems  Constitutional: Negative for fever, malaise/fatigue and diaphoresis.  Cardiovascular: Negative for chest pain and palpitations.  Gastrointestinal: Negative for nausea,  vomiting and abdominal pain.  Musculoskeletal: Negative for back pain.  Neurological: Positive for dizziness. Negative for tingling, tremors, sensory change, speech change, focal weakness, seizures, loss of consciousness, weakness and headaches.  Psychiatric/Behavioral: Positive for memory loss. Negative for depression, suicidal ideas, hallucinations and substance abuse. The patient is not nervous/anxious and does not have insomnia.     Family history- Review and unchanged Social history- Review and unchanged Physical Exam: BP 140/60 mmHg  Pulse 66  Temp(Src) 97.5 F (36.4 C) (Temporal)  Resp 14  Ht 5' 9.75" (1.772 m)  Wt 193 lb 9.6 oz (87.816 kg)  BMI 27.97 kg/m2  SpO2 97% Wt Readings from Last 3 Encounters:  12/08/14 193 lb 9.6 oz (87.816 kg)  09/01/14 194 lb (87.998 kg)  06/09/14 187 lb (84.823 kg)   General Appearance: Well nourished, in no apparent distress. Eyes: PERRLA, EOMs, conjunctiva no swelling or erythema Sinuses: No Frontal/maxillary tenderness ENT/Mouth: Ext aud canals clear, TMs without erythema, bulging. No erythema, swelling, or exudate on post pharynx.  Tonsils not swollen or erythematous. Hearing normal.  Neck: Supple, thyroid normal.  Respiratory: Respiratory effort normal, BS equal bilaterally without rales, rhonchi, wheezing or stridor.  Cardio: RRR with no MRGs. Brisk peripheral pulses with 1-2+ edema.  Abdomen: Soft, + BS,  Non tender, no guarding, rebound, hernias, masses. Lymphatics: Non tender without lymphadenopathy.  Musculoskeletal: Full ROM, 5/5 strength, antalgic gait, mild left sided decreased strength.  Skin: Warm, dry without rashes, lesions, ecchymosis.  Neuro: Cranial nerves intact. Normal muscle tone, no cerebellar symptoms. Psych: Awake and oriented X 3, normal affect, poor short term memory, Insight and Judgment appropriate.    Vicie Mutters, PA-C 10:26 AM [facility]

## 2014-12-08 NOTE — Patient Instructions (Signed)

## 2014-12-09 LAB — VITAMIN D 25 HYDROXY (VIT D DEFICIENCY, FRACTURES): VIT D 25 HYDROXY: 95 ng/mL (ref 30–100)

## 2014-12-21 ENCOUNTER — Other Ambulatory Visit: Payer: Self-pay | Admitting: *Deleted

## 2014-12-21 MED ORDER — AMPHETAMINE-DEXTROAMPHETAMINE 20 MG PO TABS: 20.0000 mg | ORAL_TABLET | Freq: Two times a day (BID) | ORAL | Status: DC

## 2015-01-22 ENCOUNTER — Other Ambulatory Visit: Payer: Self-pay | Admitting: *Deleted

## 2015-01-22 MED ORDER — AMPHETAMINE-DEXTROAMPHETAMINE 20 MG PO TABS: ORAL_TABLET | ORAL | Status: DC

## 2015-02-26 ENCOUNTER — Other Ambulatory Visit: Payer: Self-pay

## 2015-02-26 MED ORDER — AMPHETAMINE-DEXTROAMPHETAMINE 20 MG PO TABS: ORAL_TABLET | ORAL | Status: DC

## 2015-03-09 ENCOUNTER — Ambulatory Visit: Payer: Self-pay | Admitting: Internal Medicine

## 2015-03-09 DIAGNOSIS — N401 Enlarged prostate with lower urinary tract symptoms: Secondary | ICD-10-CM | POA: Insufficient documentation

## 2015-03-14 ENCOUNTER — Encounter: Payer: Self-pay | Admitting: Internal Medicine

## 2015-03-14 ENCOUNTER — Ambulatory Visit (INDEPENDENT_AMBULATORY_CARE_PROVIDER_SITE_OTHER): Payer: PPO | Admitting: Internal Medicine

## 2015-03-14 VITALS — BP 140/88 | HR 72 | Temp 97.6°F | Resp 16 | Ht 69.75 in | Wt 197.6 lb

## 2015-03-14 DIAGNOSIS — E559 Vitamin D deficiency, unspecified: Secondary | ICD-10-CM

## 2015-03-14 DIAGNOSIS — I10 Essential (primary) hypertension: Secondary | ICD-10-CM

## 2015-03-14 DIAGNOSIS — Z1331 Encounter for screening for depression: Secondary | ICD-10-CM

## 2015-03-14 DIAGNOSIS — R7303 Prediabetes: Secondary | ICD-10-CM

## 2015-03-14 DIAGNOSIS — F329 Major depressive disorder, single episode, unspecified: Secondary | ICD-10-CM

## 2015-03-14 DIAGNOSIS — Z79899 Other long term (current) drug therapy: Secondary | ICD-10-CM

## 2015-03-14 DIAGNOSIS — I6339 Cerebral infarction due to thrombosis of other cerebral artery: Secondary | ICD-10-CM | POA: Diagnosis not present

## 2015-03-14 DIAGNOSIS — E291 Testicular hypofunction: Secondary | ICD-10-CM

## 2015-03-14 DIAGNOSIS — E785 Hyperlipidemia, unspecified: Secondary | ICD-10-CM | POA: Diagnosis not present

## 2015-03-14 DIAGNOSIS — Z1389 Encounter for screening for other disorder: Secondary | ICD-10-CM

## 2015-03-14 DIAGNOSIS — K219 Gastro-esophageal reflux disease without esophagitis: Secondary | ICD-10-CM

## 2015-03-14 DIAGNOSIS — E349 Endocrine disorder, unspecified: Secondary | ICD-10-CM

## 2015-03-14 DIAGNOSIS — F32A Depression, unspecified: Secondary | ICD-10-CM

## 2015-03-14 LAB — CBC WITH DIFFERENTIAL/PLATELET
Basophils Absolute: 0.1 10*3/uL (ref 0.0–0.1)
Basophils Relative: 1 % (ref 0–1)
EOS ABS: 0.1 10*3/uL (ref 0.0–0.7)
EOS PCT: 2 % (ref 0–5)
HEMATOCRIT: 42.3 % (ref 39.0–52.0)
Hemoglobin: 14.4 g/dL (ref 13.0–17.0)
LYMPHS ABS: 1.9 10*3/uL (ref 0.7–4.0)
Lymphocytes Relative: 34 % (ref 12–46)
MCH: 28.9 pg (ref 26.0–34.0)
MCHC: 34 g/dL (ref 30.0–36.0)
MCV: 84.9 fL (ref 78.0–100.0)
MONOS PCT: 9 % (ref 3–12)
MPV: 10.5 fL (ref 8.6–12.4)
Monocytes Absolute: 0.5 10*3/uL (ref 0.1–1.0)
NEUTROS PCT: 54 % (ref 43–77)
Neutro Abs: 3 10*3/uL (ref 1.7–7.7)
PLATELETS: 159 10*3/uL (ref 150–400)
RBC: 4.98 MIL/uL (ref 4.22–5.81)
RDW: 16 % — ABNORMAL HIGH (ref 11.5–15.5)
WBC: 5.6 10*3/uL (ref 4.0–10.5)

## 2015-03-14 LAB — BASIC METABOLIC PANEL WITH GFR
BUN: 23 mg/dL (ref 7–25)
CALCIUM: 9.6 mg/dL (ref 8.6–10.3)
CHLORIDE: 108 mmol/L (ref 98–110)
CO2: 26 mmol/L (ref 20–31)
CREATININE: 1.59 mg/dL — AB (ref 0.70–1.18)
GFR, EST AFRICAN AMERICAN: 47 mL/min — AB (ref >=60)
GFR, Est Non African American: 41 mL/min — ABNORMAL LOW (ref >=60)
GLUCOSE: 123 mg/dL — AB (ref 65–99)
Potassium: 4.7 mmol/L (ref 3.5–5.3)
Sodium: 143 mmol/L (ref 135–146)

## 2015-03-14 LAB — LIPID PANEL
CHOLESTEROL: 180 mg/dL (ref 125–200)
HDL: 65 mg/dL (ref >=40)
LDL CALC: 99 mg/dL (ref <130)
TRIGLYCERIDES: 81 mg/dL (ref <150)
Total CHOL/HDL Ratio: 2.8 Ratio (ref <=5.0)
VLDL: 16 mg/dL (ref <30)

## 2015-03-14 LAB — HEPATIC FUNCTION PANEL
ALBUMIN: 4 g/dL (ref 3.6–5.1)
ALT: 14 U/L (ref 9–46)
AST: 13 U/L (ref 10–35)
Alkaline Phosphatase: 51 U/L (ref 40–115)
Bilirubin, Direct: 0.1 mg/dL (ref <=0.2)
Indirect Bilirubin: 0.4 mg/dL (ref 0.2–1.2)
TOTAL PROTEIN: 6 g/dL — AB (ref 6.1–8.1)
Total Bilirubin: 0.5 mg/dL (ref 0.2–1.2)

## 2015-03-14 LAB — MAGNESIUM: MAGNESIUM: 2.1 mg/dL (ref 1.5–2.5)

## 2015-03-14 LAB — TSH: TSH: 4.341 u[IU]/mL (ref 0.350–4.500)

## 2015-03-14 NOTE — Patient Instructions (Signed)

## 2015-03-14 NOTE — Progress Notes (Signed)
Patient ID: Lance Ramirez, male   DOB: 1933-10-25, 82 y.o.   MRN: [id number]   This very nice 82 y.o. MWM  presents for follow up with Hypertension, Hyperlipidemia, Pre-Diabetes, Depression, Testosterone  and Vitamin D Deficiency. Patient has hx/o GERD apparently dontrolled with Diet.   Patient is treated for HTN since 1998 & BP has been controlled at home. Today's BP: 140/88 mmHg. In Dec 2012 patient had a CVA aborted with TPA and then in Mar 2014 experienced a 2sd CVA w/sx's of RUE paresthesias. He persists with mild dysnomia, poor ST recall and decreased cognition with limited insight and impaired insight and judgement as per his wife. After his CVA, he developed Depression and apparently is compensating well WRT that.    Hyperlipidemia is controlled with diet & meds. Patient denies myalgias or other med SE's. Current Lipids are at goal with Cholesterol 180; HDL 65; LDL 99; Triglycerides 81.   Also, the patient has history of PreDiabetes since Dec 2012 with A1c 5.8% and has had no symptoms of reactive hypoglycemia, diabetic polys, paresthesias or visual blurring.  Last A1c was  6.0% on 12/08/2014.   Further, the patient also has history of Vitamin D Deficiency of 26 in 2008  and supplements vitamin D without any suspected side-effects. Last vitamin D was 95 on 12/08/2014.  Medication Sig  . ALPRAZolam  1 MG tablet TAKE 1/2-1 TABLET BY MOUTH AT BEDTIME AS NEEDED  . ADDERALL 20 MG  Take 1/2 to 1 tablet twice daily as needed for alertness and depression.  Marland Kitchen aspirin EC 81 MG  Take 81 mg by mouth daily.  Marland Kitchen atenolol  100 MG  Take 1 tablet (100 mg total) by mouth daily.  Marland Kitchen atorvastatin  80 MG  Take 1/2 to 1 tablet daily at dinner time  . VITAMIN D 5000 UNITS  Take 1 tablet by mouth 2 (two) times daily.  . clopidogrel75 MG tablet Take 1 tablet (75 mg total) by mouth daily.  . TYLENOL PM 25-500 MG  Take 1 tablet by mouth at bedtime as needed.  . doxazosin 4 MG tablet Take 1 tablet (4 mg total) by mouth  daily.  Marland Kitchen testosterone cypionate  200 MG/ML inj INJECT 2 ML EVERY 2 WEEKS OR AS DIRECTED  . sildenafil 100 MG tablet Take 1/2 to 1 tablet daily as needed for XXXX   No Known Allergies  PMHx:   Past Medical History  Diagnosis Date  . TIA (transient ischemic attack)   . Confusion   . H/O dizziness   . Arthritis   . GERD (gastroesophageal reflux disease)   . Hypertension   . Hyperlipidemia   . Stroke ([place]) 05/2012    affected memory  . Cerebral embolism with cerebral infarction ([place]) 02/10/2011  . Vitamin D deficiency   . Other testicular hypofunction    Immunization History  Administered Date(s) Administered  . DT 11/28/2008  . Pneumococcal Polysaccharide-23 11/28/2008  . Zoster 09/16/2006   Past Surgical History  Procedure Laterality Date  . Hernia repair    . Back surgery    . 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 Anterior cervical decomp/discectomy fusion N/A 10/25/2012    Procedure: ANTERIOR CERVICAL DECOMPRESSION/DISCECTOMY FUSION CERVICAL THREE-FOUR 1 LEVEL/HARDWARE REMOVAL;  Surgeon: Ophelia Charter, MD;  Location: [facility];  Service: Neurosurgery;  Laterality: N/A;  Cervical three-four anterior cervical decompression with fusion interbody prothesis plating and bonegraft with removal of old Premier plate  FHx:    Reviewed / unchanged  SHx:    Reviewed / unchanged  Systems Review:  Constitutional: Denies fever, chills, wt changes, headaches, insomnia, fatigue, night sweats, change in appetite. Eyes: Denies redness, blurred vision, diplopia, discharge, itchy, watery eyes.  ENT: Denies discharge, congestion, post nasal drip, epistaxis, sore throat, earache, hearing loss, dental pain, tinnitus, vertigo, sinus pain, snoring.  CV: Denies chest pain, palpitations, irregular heartbeat, syncope, dyspnea, diaphoresis, orthopnea, PND, claudication or edema. Respiratory: denies cough, dyspnea, DOE, pleurisy, hoarseness, laryngitis, wheezing.   Gastrointestinal: Denies dysphagia, odynophagia, heartburn, reflux, water brash, abdominal pain or cramps, nausea, vomiting, bloating, diarrhea, constipation, hematemesis, melena, hematochezia  or hemorrhoids. Genitourinary: Denies dysuria, frequency, urgency, nocturia, hesitancy, discharge, hematuria or flank pain. Musculoskeletal: Denies arthralgias, myalgias, stiffness, jt. swelling, pain, limping or strain/sprain.  Skin: Denies pruritus, rash, hives, warts, acne, eczema or change in skin lesion(s). Neuro: No weakness, tremor, incoordination, spasms, paresthesia or pain. Psychiatric: Denies confusion, memory loss or sensory loss. Endo: Denies change in weight, skin or hair change.  Heme/Lymph: No excessive bleeding, bruising or enlarged lymph nodes.  Physical Exam  BP 140/88 mmHg  Pulse 72  Temp(Src) 97.6 F (36.4 C)  Resp 16  Ht 5' 9.75" (1.772 m)  Wt 197 lb 9.6 oz (89.631 kg)  BMI 28.55 kg/m2  Appears well nourished and in no distress.  Eyes: PERRLA, EOMs, conjunctiva no swelling or erythema. Sinuses: No frontal/maxillary tenderness ENT/Mouth: EAC's clear, TM's nl w/o erythema, bulging. Nares clear w/o erythema, swelling, exudates. Oropharynx clear without erythema or exudates. Oral hygiene is good. Tongue normal, non obstructing. Hearing intact.  Neck: Supple. Thyroid nl. Car 2+/2+ without bruits, nodes or JVD. Chest: Respirations nl with BS clear & equal w/o rales, rhonchi, wheezing or stridor.  Cor: Heart sounds normal w/ regular rate and rhythm without sig. murmurs, gallops, clicks, or rubs. Peripheral pulses normal and equal  without edema.  Abdomen: Soft & bowel sounds normal. Non-tender w/o guarding, rebound, hernias, masses, or organomegaly.  Lymphatics: Unremarkable.  Musculoskeletal: Full ROM all peripheral extremities, joint stability, 5/5 strength, and normal gait.  Skin: Warm, dry without exposed rashes, lesions or ecchymosis apparent.  Neuro: Cranial nerves  intact, reflexes equal bilaterally. Sensory-motor testing grossly intact. Tendon reflexes grossly intact.  Pysch: Alert & oriented x 3.  Insight and judgement nl & appropriate. No ideations.  Assessment and Plan:  1. Essential hypertension  - TSH  2. Hyperlipidemia  - Lipid panel - TSH  3. Prediabetes  - Hemoglobin A1c - Insulin, random  4. Vitamin D deficiency  - VITAMIN D 25 Hydroxy   5. Testosterone deficiency   6. Gastroesophageal reflux disease   7. 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 Medication management  - CBC with Differential/Platelet - BASIC METABOLIC PANEL WITH GFR - Hepatic function panel - Magnesium  9. Depression screen   10. Depression, controlled   Recommended regular exercise, BP monitoring, weight control, and discussed med and SE's. Recommended labs to assess and monitor clinical status. Further disposition pending results of labs. Over 30 minutes of exam, counseling, chart review was performed

## 2015-03-15 LAB — VITAMIN D 25 HYDROXY (VIT D DEFICIENCY, FRACTURES): VIT D 25 HYDROXY: 74 ng/mL (ref 30–100)

## 2015-03-15 LAB — HEMOGLOBIN A1C
HEMOGLOBIN A1C: 6 % — AB (ref <5.7)
MEAN PLASMA GLUCOSE: 126 mg/dL — AB (ref <117)

## 2015-03-15 LAB — INSULIN, RANDOM: Insulin: 5.6 u[IU]/mL (ref 2.0–19.6)

## 2015-03-19 ENCOUNTER — Other Ambulatory Visit: Payer: Self-pay

## 2015-03-19 MED ORDER — DOXAZOSIN MESYLATE 4 MG PO TABS: 4.0000 mg | ORAL_TABLET | Freq: Every day | ORAL | Status: DC

## 2015-03-20 ENCOUNTER — Other Ambulatory Visit: Payer: Self-pay | Admitting: *Deleted

## 2015-03-20 MED ORDER — DOXAZOSIN MESYLATE 4 MG PO TABS: 4.0000 mg | ORAL_TABLET | Freq: Every day | ORAL | Status: DC

## 2015-03-21 ENCOUNTER — Encounter: Payer: Self-pay | Admitting: Internal Medicine

## 2015-04-12 ENCOUNTER — Other Ambulatory Visit: Payer: Self-pay | Admitting: *Deleted

## 2015-04-12 MED ORDER — AMPHETAMINE-DEXTROAMPHETAMINE 20 MG PO TABS: ORAL_TABLET | ORAL | Status: DC

## 2015-05-06 ENCOUNTER — Other Ambulatory Visit: Payer: Self-pay | Admitting: Internal Medicine

## 2015-05-06 DIAGNOSIS — E349 Endocrine disorder, unspecified: Secondary | ICD-10-CM

## 2015-05-06 MED ORDER — TESTOSTERONE CYPIONATE 200 MG/ML IM SOLN: INTRAMUSCULAR | Status: DC

## 2015-05-16 ENCOUNTER — Encounter: Payer: Self-pay | Admitting: Internal Medicine

## 2015-05-28 ENCOUNTER — Other Ambulatory Visit: Payer: Self-pay | Admitting: *Deleted

## 2015-05-28 MED ORDER — AMPHETAMINE-DEXTROAMPHETAMINE 20 MG PO TABS: ORAL_TABLET | ORAL | Status: DC

## 2015-06-14 ENCOUNTER — Encounter: Payer: Self-pay | Admitting: Internal Medicine

## 2015-06-14 ENCOUNTER — Ambulatory Visit (INDEPENDENT_AMBULATORY_CARE_PROVIDER_SITE_OTHER): Payer: Self-pay | Admitting: Internal Medicine

## 2015-06-14 VITALS — BP 134/84 | HR 64 | Temp 97.6°F | Resp 16 | Ht 69.5 in | Wt 195.0 lb

## 2015-06-14 DIAGNOSIS — I6359 Cerebral infarction due to unspecified occlusion or stenosis of other cerebral artery: Secondary | ICD-10-CM

## 2015-06-14 DIAGNOSIS — R7303 Prediabetes: Secondary | ICD-10-CM

## 2015-06-14 DIAGNOSIS — Z125 Encounter for screening for malignant neoplasm of prostate: Secondary | ICD-10-CM

## 2015-06-14 DIAGNOSIS — G308 Other Alzheimer's disease: Secondary | ICD-10-CM | POA: Diagnosis not present

## 2015-06-14 DIAGNOSIS — I10 Essential (primary) hypertension: Secondary | ICD-10-CM | POA: Diagnosis not present

## 2015-06-14 DIAGNOSIS — Z136 Encounter for screening for cardiovascular disorders: Secondary | ICD-10-CM

## 2015-06-14 DIAGNOSIS — Z1212 Encounter for screening for malignant neoplasm of rectum: Secondary | ICD-10-CM

## 2015-06-14 DIAGNOSIS — R6889 Other general symptoms and signs: Secondary | ICD-10-CM | POA: Diagnosis not present

## 2015-06-14 DIAGNOSIS — R7309 Other abnormal glucose: Secondary | ICD-10-CM | POA: Diagnosis not present

## 2015-06-14 DIAGNOSIS — Z0001 Encounter for general adult medical examination with abnormal findings: Secondary | ICD-10-CM

## 2015-06-14 DIAGNOSIS — G301 Alzheimer's disease with late onset: Secondary | ICD-10-CM

## 2015-06-14 DIAGNOSIS — E785 Hyperlipidemia, unspecified: Secondary | ICD-10-CM

## 2015-06-14 DIAGNOSIS — N320 Bladder-neck obstruction: Secondary | ICD-10-CM | POA: Diagnosis not present

## 2015-06-14 DIAGNOSIS — E349 Endocrine disorder, unspecified: Secondary | ICD-10-CM

## 2015-06-14 DIAGNOSIS — K219 Gastro-esophageal reflux disease without esophagitis: Secondary | ICD-10-CM

## 2015-06-14 DIAGNOSIS — Z0000 Encounter for general adult medical examination without abnormal findings: Secondary | ICD-10-CM | POA: Diagnosis not present

## 2015-06-14 DIAGNOSIS — Z79899 Other long term (current) drug therapy: Secondary | ICD-10-CM

## 2015-06-14 DIAGNOSIS — F0280 Dementia in other diseases classified elsewhere without behavioral disturbance: Secondary | ICD-10-CM | POA: Insufficient documentation

## 2015-06-14 DIAGNOSIS — E559 Vitamin D deficiency, unspecified: Secondary | ICD-10-CM

## 2015-06-14 DIAGNOSIS — E291 Testicular hypofunction: Secondary | ICD-10-CM | POA: Diagnosis not present

## 2015-06-14 DIAGNOSIS — F03B0 Unspecified dementia, moderate, without behavioral disturbance, psychotic disturbance, mood disturbance, and anxiety: Secondary | ICD-10-CM | POA: Insufficient documentation

## 2015-06-14 LAB — BASIC METABOLIC PANEL WITH GFR
BUN: 21 mg/dL (ref 7–25)
CALCIUM: 9.4 mg/dL (ref 8.6–10.3)
CO2: 29 mmol/L (ref 20–31)
CREATININE: 1.44 mg/dL — AB (ref 0.70–1.18)
Chloride: 104 mmol/L (ref 98–110)
GFR, EST AFRICAN AMERICAN: 53 mL/min — AB (ref >=60)
GFR, Est Non African American: 46 mL/min — ABNORMAL LOW (ref >=60)
GLUCOSE: 113 mg/dL — AB (ref 65–99)
Potassium: 4.5 mmol/L (ref 3.5–5.3)
SODIUM: 142 mmol/L (ref 135–146)

## 2015-06-14 LAB — MAGNESIUM: MAGNESIUM: 2 mg/dL (ref 1.5–2.5)

## 2015-06-14 LAB — CBC WITH DIFFERENTIAL/PLATELET
BASOS ABS: 0 {cells}/uL (ref 0–200)
Basophils Relative: 0 %
EOS PCT: 3 %
Eosinophils Absolute: 150 cells/uL (ref 15–500)
HEMATOCRIT: 46.6 % (ref 38.5–50.0)
HEMOGLOBIN: 15.4 g/dL (ref 13.2–17.1)
LYMPHS ABS: 1300 {cells}/uL (ref 850–3900)
LYMPHS PCT: 26 %
MCH: 28.7 pg (ref 27.0–33.0)
MCHC: 33 g/dL (ref 32.0–36.0)
MCV: 86.9 fL (ref 80.0–100.0)
MPV: 11.7 fL (ref 7.5–12.5)
Monocytes Absolute: 400 cells/uL (ref 200–950)
Monocytes Relative: 8 %
NEUTROS PCT: 63 %
Neutro Abs: 3150 cells/uL (ref 1500–7800)
Platelets: 130 10*3/uL — ABNORMAL LOW (ref 140–400)
RBC: 5.36 MIL/uL (ref 4.20–5.80)
RDW: 14.6 % (ref 11.0–15.0)
WBC: 5 10*3/uL (ref 3.8–10.8)

## 2015-06-14 LAB — HEPATIC FUNCTION PANEL
ALT: 11 U/L (ref 9–46)
AST: 11 U/L (ref 10–35)
Albumin: 3.8 g/dL (ref 3.6–5.1)
Alkaline Phosphatase: 50 U/L (ref 40–115)
Bilirubin, Direct: 0.1 mg/dL (ref <=0.2)
Indirect Bilirubin: 0.5 mg/dL (ref 0.2–1.2)
TOTAL PROTEIN: 6.1 g/dL (ref 6.1–8.1)
Total Bilirubin: 0.6 mg/dL (ref 0.2–1.2)

## 2015-06-14 LAB — LIPID PANEL
CHOLESTEROL: 166 mg/dL (ref 125–200)
HDL: 46 mg/dL (ref >=40)
LDL Cholesterol: 101 mg/dL (ref <130)
TRIGLYCERIDES: 95 mg/dL (ref <150)
Total CHOL/HDL Ratio: 3.6 Ratio (ref <=5.0)
VLDL: 19 mg/dL (ref <30)

## 2015-06-14 LAB — TSH: TSH: 5.11 mIU/L — ABNORMAL HIGH (ref 0.40–4.50)

## 2015-06-14 NOTE — Patient Instructions (Signed)

## 2015-06-14 NOTE — Progress Notes (Signed)
Patient ID: Lance Ramirez, male   DOB: 19-Aug-1933, 82 y.o.   MRN: [id number]  Annual  Screening/Preventative Visit And Comprehensive Evaluation & Examination  This very nice 82 y.o. MWM presents for a Wellness/Preventative Visit & comprehensive evaluation and management of multiple medical co-morbidities.  Patient has been followed for HTN, ASCVD, SDAT, Prediabetes, Hyperlipidemia and Vitamin D Deficiency.   HTN predates since 89.  Patient's BP has been controlled at home.Today's BP: 134/84 mmHg.  In 2012, patient had a CVA rescued by TPA and then again 2014 has a 2sd CVA with sequelae of mild dysnomia and dulled cognition. Patient denies any cardiac symptoms as chest pain, palpitations, shortness of breath, dizziness or ankle swelling.   Patient's hyperlipidemia is controlled with diet and medications. Patient denies myalgias or other medication SE's. Last lipids were at goal with Cholesterol 180; HDL 65; LDL 99; Triglycerides 81 on 03/14/2015.    Patient has prediabetes since 2011 with A1c 5.8% and patient denies reactive hypoglycemic symptoms, visual blurring, diabetic polys or paresthesias. Last A1c was 6.0% on 03/14/2015.   Finally, patient has history of Vitamin D Deficiency of "26" in 2008 and last vitamin D was  74 on 03/14/2015.    Medication Sig  . ADDERALL20 MG  Take 1/2 to 1 tablet twice daily as needed for alertness and depression.  Marland Kitchen aspirin EC 81 MG  Take 81 mg by mouth daily.  Marland Kitchen atenolol  100 MG  Take 1 tablet (100 mg total) by mouth daily.  Marland Kitchen atorvastatin 80 MG  Take 1/2 to 1 tablet daily at dinner time  . VITAMIN D 5000 UNITS  Take 1 tablet by mouth 2 (two) times daily.  . clopidogrel  75 MG Take 1 tablet (75 mg total) by mouth daily.  . TYLENOL PM 25-500 MG  Take 1 tablet by mouth at bedtime as needed.  . doxazosin  4 MG  Take 1 tablet (4 mg total) by mouth daily.  . sildenafil 100 MG  Take 1/2 to 1 tablet daily as needed for XXXX  . solifenacin  10 MG  Take by mouth daily.  Marland Kitchen  testosterone cypio  200 MG/ML INJECT 2 ML EVERY 2 WEEKS OR AS DIRECTED - Off    No Known Allergies   Past Medical History  Diagnosis Date  . TIA (transient ischemic attack)   . Confusion   . H/O dizziness   . Arthritis   . GERD (gastroesophageal reflux disease)   . Hypertension   . Hyperlipidemia   . Stroke ([place]) 05/2012    affected memory  . Cerebral embolism with cerebral infarction ([place]) 02/10/2011  . Vitamin D deficiency   . Other testicular hypofunction    Health Maintenance  Topic Date Due  . TETANUS/TDAP  01/22/1955  . PNA vac Low Risk Adult (2 of 2 - PCV13) 11/28/2009  . INFLUENZA VACCINE  10/09/2015  . ZOSTAVAX  Completed   Immunization History  Administered Date(s) Administered  . DT 11/28/2008  . Pneumococcal Polysaccharide-23 11/28/2008  . Zoster 09/16/2006   Past Surgical History  Procedure Laterality Date  . Hernia repair    . Back surgery    . 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 Anterior cervical decomp/discectomy fusion N/A 10/25/2012    Procedure: ANTERIOR CERVICAL DECOMPRESSION/DISCECTOMY FUSION CERVICAL THREE-FOUR 1 LEVEL/HARDWARE REMOVAL;  Surgeon: Ophelia Charter, MD;  Location: [facility];  Service: Neurosurgery;  Laterality: N/A;  Cervical three-four anterior cervical decompression with fusion  interbody prothesis plating and bonegraft with removal of old Premier plate   Family History  Problem Relation Age of Onset  . Sudden death Mother   . Other Father     Social History   Social History  . Marital Status: Married    Spouse Name: N/A  . Number of Children: N/A  . Years of Education: N/A   Occupational History  . Not on file.   Social History Main Topics  . Smoking status: Former Smoker -- 1.00 packs/day for 30 years    Types: Cigarettes    Quit date: 03/12/1978  . Smokeless tobacco: Former Systems developer    Types: Chew    Quit date: 03/12/1978  . Alcohol Use: No  . Drug Use: No  . Sexual Activity: No     ROS Constitutional: Denies fever, chills, weight loss/gain, headaches, insomnia,  night sweats or change in appetite. Does c/o fatigue. Eyes: Denies redness, blurred vision, diplopia, discharge, itchy or watery eyes.  ENT: Denies discharge, congestion, post nasal drip, epistaxis, sore throat, earache, hearing loss, dental pain, Tinnitus, Vertigo, Sinus pain or snoring.  Cardio: Denies chest pain, palpitations, irregular heartbeat, syncope, dyspnea, diaphoresis, orthopnea, PND, claudication or edema Respiratory: denies cough, dyspnea, DOE, pleurisy, hoarseness, laryngitis or wheezing.  Gastrointestinal: Denies dysphagia, heartburn, reflux, water brash, pain, cramps, nausea, vomiting, bloating, diarrhea, constipation, hematemesis, melena, hematochezia, jaundice or hemorrhoids Genitourinary: Denies dysuria, frequency, urgency, nocturia, hesitancy, discharge, hematuria or flank pain Musculoskeletal: Denies arthralgia, myalgia, stiffness, Jt. Swelling, pain, limp or strain/sprain. Denies Falls. Skin: Denies puritis, rash, hives, warts, acne, eczema or change in skin lesion Neuro: No weakness, tremor, incoordination, spasms, paresthesia or pain Psychiatric: Denies confusion, memory loss or sensory loss. Denies Depression. Endocrine: Denies change in weight, skin, hair change, nocturia, and paresthesia, diabetic polys, visual blurring or hyper / hypo glycemic episodes.  Heme/Lymph: No excessive bleeding, bruising or enlarged lymph nodes.  Physical Exam  BP 134/84 mmHg  Pulse 64  Temp(Src) 97.6 F (36.4 C)  Resp 16  Ht 5' 9.5" (1.765 m)  Wt 195 lb (88.451 kg)  BMI 28.39 kg/m2  General Appearance: Well nourished, in no apparent distress.  Eyes: PERRLA, EOMs, conjunctiva no swelling or erythema, normal fundi and vessels. Sinuses: No frontal/maxillary tenderness ENT/Mouth: EACs patent / TMs  nl. Nares clear without erythema, swelling, mucoid exudates. Oral hygiene is good. No erythema, swelling,  or exudate. Tongue normal, non-obstructing. Tonsils not swollen or erythematous. Hearing normal.  Neck: Supple, thyroid normal. No bruits, nodes or JVD. Respiratory: Respiratory effort normal.  BS equal and clear bilateral without rales, rhonci, wheezing or stridor. Cardio: Heart sounds are normal with regular rate and rhythm and no murmurs, rubs or gallops. Peripheral pulses are normal and equal bilaterally without edema. No aortic or femoral bruits. Chest: symmetric with normal excursions and percussion.  Abdomen: Soft, with Nl bowel sounds. Nontender, no guarding, rebound, hernias, masses, or organomegaly.  Lymphatics: Non tender without lymphadenopathy.  Genitourinary:  Deferred by patient. Musculoskeletal: Full ROM all peripheral extremities, joint stability, 5/5 strength, and normal gait. Skin: Warm and dry without rashes, lesions, cyanosis, clubbing or  ecchymosis.  Neuro: Cranial nerves intact, reflexes equal bilaterally. Normal muscle tone, no cerebellar symptoms. Sensation intact.  Pysch: Alert and oriented X 3 with mild dysnomia, poor word search and  limited  Insight.   Assessment and Plan  1. Annual Preventative/Screening Exam   - Microalbumin / creatinine urine ratio - EKG 12-Lead - [place], RETROPERITNL ABD,  LTD - POC Hemoccult Bld/Stl  -  Urinalysis, Routine w reflex microscopic  - PSA - CBC with Differential/Platelet - BASIC METABOLIC PANEL WITH GFR - Hepatic function panel - Magnesium - Lipid panel - TSH - Hemoglobin A1c - Insulin, random - VITAMIN D 25 Hydroxy   2. Essential hypertension  - Microalbumin / creatinine urine ratio - EKG 12-Lead - [place], RETROPERITNL ABD,  LTD - TSH  3. Hyperlipidemia  - Lipid panel - TSH  4. Prediabetes  - Hemoglobin A1c - Insulin, random  5. Vitamin D deficiency  - VITAMIN D 25 Hydroxy   6. 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 Testosterone deficiency  - taking Depo-T infrequently,  so was advised to just stop  8. Gastroesophageal reflux disease   9. SDAT (senile dementia of Alzheimer's type)   10. Screening for rectal cancer  - POC Hemoccult Bld/Stl   11. Prostate cancer screening  - PSA  12. Screening for ischemic heart disease   13. Screening for AAA (aortic abdominal aneurysm)   14. Medication management  - Urinalysis, Routine w reflex microscopic  - CBC with Differential/Platelet - BASIC METABOLIC PANEL WITH GFR - Hepatic function panel - Magnesium   Continue prudent diet as discussed, weight control, BP monitoring, regular exercise, and medications as discussed.  Discussed med effects and SE's. Routine screening labs and tests as requested with regular follow-up as recommended. Over 40 minutes of exam, counseling, chart review and high complex critical decision making was performed

## 2015-06-15 ENCOUNTER — Other Ambulatory Visit: Payer: Self-pay | Admitting: Internal Medicine

## 2015-06-15 DIAGNOSIS — E039 Hypothyroidism, unspecified: Secondary | ICD-10-CM

## 2015-06-15 LAB — INSULIN, RANDOM: INSULIN: 5.6 u[IU]/mL (ref 2.0–19.6)

## 2015-06-15 LAB — URINALYSIS, ROUTINE W REFLEX MICROSCOPIC
BILIRUBIN URINE: NEGATIVE
Glucose, UA: NEGATIVE
HGB URINE DIPSTICK: NEGATIVE
KETONES UR: NEGATIVE
Leukocytes, UA: NEGATIVE
NITRITE: NEGATIVE
SPECIFIC GRAVITY, URINE: 1.015 (ref 1.001–1.035)
pH: 6.5 (ref 5.0–8.0)

## 2015-06-15 LAB — HEMOGLOBIN A1C
HEMOGLOBIN A1C: 6.6 % — AB (ref <5.7)
MEAN PLASMA GLUCOSE: 143 mg/dL

## 2015-06-15 LAB — MICROALBUMIN / CREATININE URINE RATIO
Creatinine, Urine: 137 mg/dL (ref 20–370)
MICROALB/CREAT RATIO: 19 ug/mg{creat} (ref <30)
Microalb, Ur: 2.6 mg/dL

## 2015-06-15 LAB — VITAMIN D 25 HYDROXY (VIT D DEFICIENCY, FRACTURES): VIT D 25 HYDROXY: 89 ng/mL (ref 30–100)

## 2015-06-15 LAB — PSA: PSA: 2.28 ng/mL (ref <=4.00)

## 2015-06-28 ENCOUNTER — Other Ambulatory Visit: Payer: Self-pay | Admitting: Internal Medicine

## 2015-06-28 DIAGNOSIS — G4700 Insomnia, unspecified: Secondary | ICD-10-CM

## 2015-07-10 ENCOUNTER — Other Ambulatory Visit: Payer: Self-pay | Admitting: *Deleted

## 2015-07-10 MED ORDER — AMPHETAMINE-DEXTROAMPHETAMINE 20 MG PO TABS: ORAL_TABLET | ORAL | Status: DC

## 2015-07-18 ENCOUNTER — Ambulatory Visit (INDEPENDENT_AMBULATORY_CARE_PROVIDER_SITE_OTHER): Payer: PPO

## 2015-07-18 DIAGNOSIS — E039 Hypothyroidism, unspecified: Secondary | ICD-10-CM | POA: Diagnosis not present

## 2015-07-18 LAB — TSH: TSH: 2.45 mIU/L (ref 0.40–4.50)

## 2015-07-20 ENCOUNTER — Ambulatory Visit: Payer: Self-pay

## 2015-08-04 ENCOUNTER — Other Ambulatory Visit: Payer: Self-pay | Admitting: Internal Medicine

## 2015-08-05 ENCOUNTER — Other Ambulatory Visit: Payer: Self-pay | Admitting: Internal Medicine

## 2015-08-05 DIAGNOSIS — I10 Essential (primary) hypertension: Secondary | ICD-10-CM

## 2015-08-05 MED ORDER — MINOXIDIL 10 MG PO TABS
ORAL_TABLET | ORAL | Status: DC
Start: 2015-08-05 — End: 2016-01-26

## 2015-09-06 ENCOUNTER — Other Ambulatory Visit: Payer: Self-pay | Admitting: *Deleted

## 2015-09-06 MED ORDER — AMPHETAMINE-DEXTROAMPHETAMINE 20 MG PO TABS: ORAL_TABLET | ORAL | Status: DC

## 2015-09-17 ENCOUNTER — Encounter: Payer: Self-pay | Admitting: Internal Medicine

## 2015-09-17 ENCOUNTER — Ambulatory Visit (INDEPENDENT_AMBULATORY_CARE_PROVIDER_SITE_OTHER): Payer: PPO | Admitting: Internal Medicine

## 2015-09-17 VITALS — BP 124/70 | HR 72 | Temp 97.9°F | Resp 16 | Ht 69.5 in | Wt 202.6 lb

## 2015-09-17 DIAGNOSIS — R42 Dizziness and giddiness: Secondary | ICD-10-CM | POA: Diagnosis not present

## 2015-09-17 DIAGNOSIS — Z79899 Other long term (current) drug therapy: Secondary | ICD-10-CM

## 2015-09-17 DIAGNOSIS — I69398 Other sequelae of cerebral infarction: Secondary | ICD-10-CM

## 2015-09-17 DIAGNOSIS — R413 Other amnesia: Secondary | ICD-10-CM

## 2015-09-17 LAB — CBC WITH DIFFERENTIAL/PLATELET
BASOS ABS: 0 {cells}/uL (ref 0–200)
BASOS PCT: 0 %
EOS ABS: 122 {cells}/uL (ref 15–500)
Eosinophils Relative: 2 %
HEMATOCRIT: 41.7 % (ref 38.5–50.0)
HEMOGLOBIN: 14 g/dL (ref 13.2–17.1)
LYMPHS ABS: 1342 {cells}/uL (ref 850–3900)
Lymphocytes Relative: 22 %
MCH: 28.9 pg (ref 27.0–33.0)
MCHC: 33.6 g/dL (ref 32.0–36.0)
MCV: 86 fL (ref 80.0–100.0)
MONO ABS: 488 {cells}/uL (ref 200–950)
MPV: 10.8 fL (ref 7.5–12.5)
Monocytes Relative: 8 %
NEUTROS ABS: 4148 {cells}/uL (ref 1500–7800)
Neutrophils Relative %: 68 %
Platelets: 162 10*3/uL (ref 140–400)
RBC: 4.85 MIL/uL (ref 4.20–5.80)
RDW: 14.6 % (ref 11.0–15.0)
WBC: 6.1 10*3/uL (ref 3.8–10.8)

## 2015-09-17 LAB — BASIC METABOLIC PANEL WITH GFR
BUN: 20 mg/dL (ref 7–25)
CHLORIDE: 106 mmol/L (ref 98–110)
CO2: 29 mmol/L (ref 20–31)
CREATININE: 1.26 mg/dL — AB (ref 0.70–1.18)
Calcium: 9.2 mg/dL (ref 8.6–10.3)
GFR, Est African American: 62 mL/min (ref >=60)
GFR, Est Non African American: 54 mL/min — ABNORMAL LOW (ref >=60)
Glucose, Bld: 110 mg/dL — ABNORMAL HIGH (ref 65–99)
POTASSIUM: 4 mmol/L (ref 3.5–5.3)
Sodium: 143 mmol/L (ref 135–146)

## 2015-09-17 NOTE — Patient Instructions (Signed)
Recommend use a Walker for BALANCE

## 2015-09-21 ENCOUNTER — Ambulatory Visit (HOSPITAL_COMMUNITY)
Admission: RE | Admit: 2015-09-21 | Discharge: 2015-09-21 | Disposition: A | Discharge status: Home / Self Care | Payer: PPO | Source: Ambulatory Visit | Attending: Internal Medicine | Admitting: Internal Medicine

## 2015-09-21 DIAGNOSIS — R413 Other amnesia: Secondary | ICD-10-CM | POA: Diagnosis not present

## 2015-09-21 DIAGNOSIS — R42 Dizziness and giddiness: Secondary | ICD-10-CM | POA: Diagnosis not present

## 2015-09-21 DIAGNOSIS — I69398 Other sequelae of cerebral infarction: Secondary | ICD-10-CM | POA: Diagnosis not present

## 2015-09-21 DIAGNOSIS — Z9181 History of falling: Secondary | ICD-10-CM | POA: Insufficient documentation

## 2015-09-21 DIAGNOSIS — S0990XA Unspecified injury of head, initial encounter: Secondary | ICD-10-CM | POA: Diagnosis not present

## 2015-09-22 ENCOUNTER — Encounter: Payer: Self-pay | Admitting: Internal Medicine

## 2015-09-22 NOTE — Progress Notes (Signed)
Subjective:    Patient ID: Lance Ramirez, male    DOB: 1933/06/18, 82 y.o.   MRN: [id number]  HPI  This very nice 82 yo MWM with HTN, HLD, ASCVD s/p CVA's & TIA's, SDAT and Vit D Deficiency is brought in by his wife for evaluation for seemingly schange in mental status and worsening short term recall. He reportedly has had recent falls , but wife has no knowledge if any head injury. Patient is on dual therapy with bASA & Clopidogrel. Does c/o sense of imbalance and is using a cane.   Medication Sig  . ALPRAZolam 1 MG tablet TAKE 1/2 TO 1 TABLET BY MOUTH AT BEDTIME AS NEEDED  . ADDERALL 20 MG  Take 1/2 to 1 tablet twice daily as needed for alertness and depression.  Marland Kitchen aspirin EC 81 MG  Take 81 mg by mouth daily.  Marland Kitchen atenolol  100 MG  Take 1 tablet (100 mg total) by mouth daily.  Marland Kitchen VITAMIN D 5000 UNITS  Take 1 tablet by mouth 2 (two) times daily.  . clopidogrel 75 MG tablet Take 1 tablet (75 mg total) by mouth daily.  Marland Kitchen doxazosin  4 MG tablet Take 1 tablet (4 mg total) by mouth daily.  . Minoxidil  10 MG tablet TAKE 1/2 TO 1 TABLET DAILY FOR BLOOD PRESSURE AS DIRECTED  . solifenacin  10 MG tablet Take by mouth daily.  Marland Kitchen testosterone cypionate 200 MG/ML inj INJECT 2 ML EVERY 2 WEEKS OR AS DIRECTED   No Known Allergies   Past Medical History  Diagnosis Date  . TIA (transient ischemic attack)   . Confusion   . H/O dizziness   . Arthritis   . GERD    . Hypertension   . Hyperlipidemia   . Stroke [facility])  - affected memory 05/2012  . Cerebral embolism with cerebral infarction ([place]) 02/10/2011  . Vitamin D deficiency   . Other testicular hypofunction    Past Surgical History  Procedure Date  . Hernia repair   . Back surgery   . Amputation  - shot himself in toe on acciden 1952  . Anterior cervical decomp/discectomy fusion -Cx 3/4  - Ophelia Charter, MD 10/25/2012   Review of Systems  10 point systems review negative except as above.    Objective:   Physical Exam  BP 124/70 mmHg   Pulse 72  Temp(Src) 97.9 F (36.6 C) (Temporal)  Resp 16  Ht 5' 9.5" (1.765 m)  Wt 202 lb 9.6 oz (91.899 kg)  BMI 29.50 kg/m2  SpO2 98%  In no distress.   HEENT - Eac's patent. TM's Nl. EOM's full. PERRLA. NasoOroPharynx clear. Neck - supple. Nl Thyroid. Carotids 2+ & No bruits, nodes, JVD Chest - Clear equal BS w/o Rales, rhonchi, wheezes. Cor - Nl HS. RRR w/o sig MGR. PP 1(+). No edema. Abd - No palpable organomegaly, masses or tenderness. BS nl. MS- FROM w/o deformities. Muscle power, tone and bulk Nl. Gait Nl. Neuro - No obvious Cr N abnormalities. Sensory, motor and Cerebellar functions appear Nl w/o focal abnormalities. Decreased ST recall and limited insight.  Psyche - Mental status flat& appropriate.  No delusions, ideations or obvious mood abnormalities.    Assessment & Plan:   1. Memory loss  - CT Head Wo Contrast; Future  2. Vertigo following cerebrovascular accident  - CT Head Wo Contrast; Future - encouraged to use a walker  3. Medication management  - CBC with Differential/Platelet - BASIC METABOLIC PANEL WITH  GFR

## 2015-09-24 ENCOUNTER — Encounter: Payer: Self-pay | Admitting: Internal Medicine

## 2015-09-24 ENCOUNTER — Ambulatory Visit (INDEPENDENT_AMBULATORY_CARE_PROVIDER_SITE_OTHER): Payer: PPO | Admitting: Internal Medicine

## 2015-09-24 VITALS — BP 118/60 | HR 72 | Temp 98.0°F | Resp 16 | Ht 69.5 in | Wt 200.0 lb

## 2015-09-24 DIAGNOSIS — E291 Testicular hypofunction: Secondary | ICD-10-CM | POA: Diagnosis not present

## 2015-09-24 DIAGNOSIS — E559 Vitamin D deficiency, unspecified: Secondary | ICD-10-CM | POA: Diagnosis not present

## 2015-09-24 DIAGNOSIS — R296 Repeated falls: Secondary | ICD-10-CM | POA: Diagnosis not present

## 2015-09-24 DIAGNOSIS — G308 Other Alzheimer's disease: Secondary | ICD-10-CM | POA: Diagnosis not present

## 2015-09-24 DIAGNOSIS — I10 Essential (primary) hypertension: Secondary | ICD-10-CM

## 2015-09-24 DIAGNOSIS — Z9181 History of falling: Secondary | ICD-10-CM

## 2015-09-24 DIAGNOSIS — F0280 Dementia in other diseases classified elsewhere without behavioral disturbance: Secondary | ICD-10-CM

## 2015-09-24 DIAGNOSIS — Z79899 Other long term (current) drug therapy: Secondary | ICD-10-CM | POA: Diagnosis not present

## 2015-09-24 DIAGNOSIS — R7303 Prediabetes: Secondary | ICD-10-CM

## 2015-09-24 DIAGNOSIS — E785 Hyperlipidemia, unspecified: Secondary | ICD-10-CM | POA: Diagnosis not present

## 2015-09-24 DIAGNOSIS — G301 Alzheimer's disease with late onset: Secondary | ICD-10-CM

## 2015-09-24 LAB — BASIC METABOLIC PANEL WITH GFR
BUN: 21 mg/dL (ref 7–25)
CHLORIDE: 107 mmol/L (ref 98–110)
CO2: 28 mmol/L (ref 20–31)
Calcium: 8.8 mg/dL (ref 8.6–10.3)
Creat: 1.53 mg/dL — ABNORMAL HIGH (ref 0.70–1.18)
GFR, EST AFRICAN AMERICAN: 49 mL/min — AB (ref >=60)
GFR, EST NON AFRICAN AMERICAN: 43 mL/min — AB (ref >=60)
Glucose, Bld: 122 mg/dL — ABNORMAL HIGH (ref 65–99)
POTASSIUM: 3.8 mmol/L (ref 3.5–5.3)
Sodium: 140 mmol/L (ref 135–146)

## 2015-09-24 LAB — HEMOGLOBIN A1C
Hgb A1c MFr Bld: 6.5 % — ABNORMAL HIGH (ref <5.7)
MEAN PLASMA GLUCOSE: 140 mg/dL

## 2015-09-24 LAB — HEPATIC FUNCTION PANEL
ALBUMIN: 3.6 g/dL (ref 3.6–5.1)
ALK PHOS: 47 U/L (ref 40–115)
ALT: 10 U/L (ref 9–46)
AST: 11 U/L (ref 10–35)
BILIRUBIN INDIRECT: 0.5 mg/dL (ref 0.2–1.2)
BILIRUBIN TOTAL: 0.6 mg/dL (ref 0.2–1.2)
Bilirubin, Direct: 0.1 mg/dL (ref <=0.2)
Total Protein: 5.4 g/dL — ABNORMAL LOW (ref 6.1–8.1)

## 2015-09-24 LAB — CBC WITH DIFFERENTIAL/PLATELET
BASOS PCT: 1 %
Basophils Absolute: 48 cells/uL (ref 0–200)
EOS ABS: 96 {cells}/uL (ref 15–500)
Eosinophils Relative: 2 %
HEMATOCRIT: 40.4 % (ref 38.5–50.0)
Hemoglobin: 13.4 g/dL (ref 13.2–17.1)
Lymphocytes Relative: 25 %
Lymphs Abs: 1200 cells/uL (ref 850–3900)
MCH: 28.6 pg (ref 27.0–33.0)
MCHC: 33.2 g/dL (ref 32.0–36.0)
MCV: 86.3 fL (ref 80.0–100.0)
MONO ABS: 432 {cells}/uL (ref 200–950)
MPV: 10.8 fL (ref 7.5–12.5)
Monocytes Relative: 9 %
NEUTROS ABS: 3024 {cells}/uL (ref 1500–7800)
Neutrophils Relative %: 63 %
PLATELETS: 144 10*3/uL (ref 140–400)
RBC: 4.68 MIL/uL (ref 4.20–5.80)
RDW: 15.1 % — ABNORMAL HIGH (ref 11.0–15.0)
WBC: 4.8 10*3/uL (ref 3.8–10.8)

## 2015-09-24 LAB — TSH: TSH: 5.11 m[IU]/L — AB (ref 0.40–4.50)

## 2015-09-24 LAB — LIPID PANEL
Cholesterol: 152 mg/dL (ref 125–200)
HDL: 61 mg/dL (ref >=40)
LDL Cholesterol: 79 mg/dL (ref <130)
Total CHOL/HDL Ratio: 2.5 Ratio (ref <=5.0)
Triglycerides: 61 mg/dL (ref <150)
VLDL: 12 mg/dL (ref <30)

## 2015-09-24 MED ORDER — MEMANTINE HCL 28 X 5 MG & 21 X 10 MG PO TABS: ORAL_TABLET | ORAL | Status: DC

## 2015-09-24 MED ORDER — FUROSEMIDE 40 MG PO TABS: 40.0000 mg | ORAL_TABLET | Freq: Every day | ORAL | Status: DC

## 2015-09-24 NOTE — Progress Notes (Signed)
Assessment and Plan:  Hypertension:  -stop doxazosin -start lasix 40 mg due to leg swelling -Continue medication -monitor blood pressure at home. -Continue DASH diet -Reminder to go to the ER if any CP, SOB, nausea, dizziness, severe HA, changes vision/speech, left arm numbness and tingling and jaw pain.  Cholesterol - Continue diet and exercise -Check cholesterol.   Diabetes with diabetic chronic kidney disease -Continue diet and exercise.  -Check A1C  Vitamin D Def -check level -continue medications.   CVA history -cont plavix -cont tight blood pressure control  SDAT -discussed in depth the likely progression of disease state -went over CT scan results with patient and his wife. -they do want to try namenda -did encourage an increase in exercise  Hypogonadism -stop testosterone  Continue diet and meds as discussed. Further disposition pending results of labs. Discussed med's effects and SE's.    HPI 82 y.o. male  presents for 3 month follow up with hypertension, hyperlipidemia, diabetes and vitamin D deficiency.   His blood pressure has been controlled at home, today their BP is BP: 118/60 mmHg.He does workout. He denies chest pain, shortness of breath, dizziness.   He is on cholesterol medication and denies myalgias. His cholesterol is at goal. The cholesterol was:  06/14/2015: Cholesterol 166; HDL 46; LDL Cholesterol 101; Triglycerides 95   He has been working on diet and exercise for diabetes without complications, he is on bASA, he is on ACE/ARB, and denies  foot ulcerations, hyperglycemia, hypoglycemia , increased appetite, nausea, paresthesia of the feet, polydipsia, polyuria, visual disturbances, vomiting and weight loss. Last A1C was: 06/14/2015: Hgb A1c MFr Bld 6.6*   Patient is on Vitamin D supplement. 06/14/2015: Vit D, 25-Hydroxy 89  He is here with his wife today.  He is having a lot of dizziness still that comes and goes.  He reports that he probably won't  remember this visit.  His wife reports that his memory loss is signficantly worse.  He very rarely leaving the house.     Current Medications:  Current Outpatient Prescriptions on File Prior to Visit  Medication Sig Dispense Refill  . ALPRAZolam (XANAX) 1 MG tablet TAKE 1/2 TO 1 TABLET BY MOUTH AT BEDTIME AS NEEDED 90 tablet 1  . amphetamine-dextroamphetamine (ADDERALL) 20 MG tablet Take 1/2 to 1 tablet twice daily as needed for alertness and depression. 60 tablet 0  . aspirin EC 81 MG tablet Take 81 mg by mouth daily.    Marland Kitchen atenolol (TENORMIN) 100 MG tablet Take 1 tablet (100 mg total) by mouth daily. 90 tablet 4  . Cholecalciferol (VITAMIN D-3) 5000 UNITS TABS Take 1 tablet by mouth 2 (two) times daily.    . clopidogrel (PLAVIX) 75 MG tablet Take 1 tablet (75 mg total) by mouth daily. 90 tablet 3  . doxazosin (CARDURA) 4 MG tablet Take 1 tablet (4 mg total) by mouth daily. 90 tablet 3  . minoxidil (LONITEN) 10 MG tablet TAKE 1/2 TO 1 TABLET DAILY FOR BLOOD PRESSURE AS DIRECTED 90 tablet 1  . solifenacin (VESICARE) 10 MG tablet Take by mouth daily.    Marland Kitchen testosterone cypionate (DEPOTESTOSTERONE CYPIONATE) 200 MG/ML injection INJECT 2 ML EVERY 2 WEEKS OR AS DIRECTED 10 mL 5   No current facility-administered medications on file prior to visit.   Medical History:  Past Medical History  Diagnosis Date  . TIA (transient ischemic attack)   . Confusion   . H/O dizziness   . Arthritis   . GERD (gastroesophageal reflux  disease)   . Hypertension   . Hyperlipidemia   . Stroke ([place]) 05/2012    affected memory  . Cerebral embolism with cerebral infarction ([place]) 02/10/2011  . Vitamin D deficiency   . Other testicular hypofunction    Allergies: No Known Allergies   Review of Systems:  Review of Systems  Constitutional: Negative for fever, chills and malaise/fatigue.  HENT: Negative for congestion, ear pain and sore throat.   Eyes: Negative.   Respiratory: Negative for cough, shortness of  breath and wheezing.   Cardiovascular: Positive for leg swelling. Negative for chest pain and palpitations.  Gastrointestinal: Negative for heartburn, abdominal pain, diarrhea, constipation, blood in stool and melena.  Genitourinary: Negative.   Skin: Negative.   Neurological: Negative for dizziness, sensory change, loss of consciousness and headaches.  Psychiatric/Behavioral: Negative for depression. The patient is not nervous/anxious and does not have insomnia.     Family history- Review and unchanged  Social history- Review and unchanged  Physical Exam: BP 118/60 mmHg  Pulse 72  Temp(Src) 98 F (36.7 C) (Temporal)  Resp 16  Ht 5' 9.5" (1.765 m)  Wt 200 lb (90.719 kg)  BMI 29.12 kg/m2 Wt Readings from Last 3 Encounters:  09/24/15 200 lb (90.719 kg)  09/17/15 202 lb 9.6 oz (91.899 kg)  06/14/15 195 lb (88.451 kg)   General Appearance: Well nourished well developed, non-toxic appearing, in no apparent distress. Eyes: PERRLA, EOMs, conjunctiva no swelling or erythema ENT/Mouth: Ear canals clear with no erythema, swelling, or discharge.  TMs normal bilaterally, oropharynx clear, moist, with no exudate.   Neck: Supple, thyroid normal, no JVD, no cervical adenopathy.  Respiratory: Respiratory effort normal, breath sounds clear A&P, no wheeze, rhonchi or rales noted.  No retractions, no accessory muscle usage Cardio: RRR with no MRGs. No noted edema.  Abdomen: Soft, + BS.  Non tender, no guarding, rebound, hernias, masses. Musculoskeletal: Full ROM, 5/5 strength, Normal gait Skin: Warm, dry without rashes, lesions, ecchymosis.  Neuro: Awake and oriented X 3, Cranial nerves intact. No cerebellar symptoms.  Psych: normal affect, Insight and Judgment appropriate.    Starlyn Skeans, PA-C 10:42 AM [facility]

## 2015-09-24 NOTE — Patient Instructions (Addendum)
Please stop testosterone injections for the next month and see how you do off the injections.    Please doxazosin.  Please start taking 40 mg of lasix which is a fluid pill daily.  Avoid salt.  Please use salt substitutes with it.

## 2015-09-25 ENCOUNTER — Other Ambulatory Visit: Payer: Self-pay | Admitting: Internal Medicine

## 2015-10-26 ENCOUNTER — Ambulatory Visit (INDEPENDENT_AMBULATORY_CARE_PROVIDER_SITE_OTHER): Payer: PPO | Admitting: *Deleted

## 2015-10-26 DIAGNOSIS — E039 Hypothyroidism, unspecified: Secondary | ICD-10-CM

## 2015-10-26 LAB — TSH: TSH: 3.68 mIU/L (ref 0.40–4.50)

## 2015-10-26 NOTE — Progress Notes (Signed)
Patient here for a recheck of his TSH due to it being abnormal on his last labs.  Patient is not taking thyroid medications currently.

## 2015-10-30 ENCOUNTER — Other Ambulatory Visit: Payer: Self-pay

## 2015-10-30 MED ORDER — AMPHETAMINE-DEXTROAMPHETAMINE 20 MG PO TABS: ORAL_TABLET | ORAL | 0 refills | Status: DC

## 2015-11-23 ENCOUNTER — Other Ambulatory Visit: Payer: Self-pay | Admitting: *Deleted

## 2015-11-23 MED ORDER — CLOPIDOGREL BISULFATE 75 MG PO TABS: 75.0000 mg | ORAL_TABLET | Freq: Every day | ORAL | 3 refills | Status: DC

## 2015-12-31 ENCOUNTER — Ambulatory Visit: Payer: Self-pay | Admitting: Internal Medicine

## 2016-01-03 ENCOUNTER — Other Ambulatory Visit: Payer: Self-pay | Admitting: *Deleted

## 2016-01-03 MED ORDER — AMPHETAMINE-DEXTROAMPHETAMINE 20 MG PO TABS: ORAL_TABLET | ORAL | 0 refills | Status: DC

## 2016-01-14 ENCOUNTER — Other Ambulatory Visit: Payer: Self-pay | Admitting: *Deleted

## 2016-01-14 MED ORDER — ATENOLOL 100 MG PO TABS: 100.0000 mg | ORAL_TABLET | Freq: Every day | ORAL | 0 refills | Status: DC

## 2016-01-18 ENCOUNTER — Telehealth: Payer: Self-pay | Admitting: *Deleted

## 2016-01-18 ENCOUNTER — Other Ambulatory Visit: Payer: Self-pay | Admitting: *Deleted

## 2016-01-18 MED ORDER — BISOPROLOL-HYDROCHLOROTHIAZIDE 10-6.25 MG PO TABS: 1.0000 | ORAL_TABLET | Freq: Every day | ORAL | 0 refills | Status: DC

## 2016-01-18 NOTE — Telephone Encounter (Signed)
Per Dr Melford Aase, the patient should stop the furosemide when he receives the new RX, Ziac, because it has a fluid component.  The spouse was informed.

## 2016-01-23 ENCOUNTER — Ambulatory Visit: Payer: Self-pay | Admitting: Internal Medicine

## 2016-01-26 ENCOUNTER — Other Ambulatory Visit: Payer: Self-pay | Admitting: Internal Medicine

## 2016-01-26 DIAGNOSIS — I10 Essential (primary) hypertension: Secondary | ICD-10-CM

## 2016-01-28 ENCOUNTER — Ambulatory Visit: Payer: Self-pay | Admitting: Internal Medicine

## 2016-01-28 ENCOUNTER — Encounter: Payer: Self-pay | Admitting: Internal Medicine

## 2016-01-28 ENCOUNTER — Ambulatory Visit (INDEPENDENT_AMBULATORY_CARE_PROVIDER_SITE_OTHER): Payer: PPO | Admitting: Internal Medicine

## 2016-01-28 VITALS — BP 158/66 | HR 94 | Temp 98.0°F | Resp 16 | Ht 69.5 in | Wt 200.0 lb

## 2016-01-28 DIAGNOSIS — G301 Alzheimer's disease with late onset: Secondary | ICD-10-CM

## 2016-01-28 DIAGNOSIS — Z0001 Encounter for general adult medical examination with abnormal findings: Secondary | ICD-10-CM

## 2016-01-28 DIAGNOSIS — K219 Gastro-esophageal reflux disease without esophagitis: Secondary | ICD-10-CM

## 2016-01-28 DIAGNOSIS — E349 Endocrine disorder, unspecified: Secondary | ICD-10-CM

## 2016-01-28 DIAGNOSIS — I6359 Cerebral infarction due to unspecified occlusion or stenosis of other cerebral artery: Secondary | ICD-10-CM | POA: Diagnosis not present

## 2016-01-28 DIAGNOSIS — E559 Vitamin D deficiency, unspecified: Secondary | ICD-10-CM

## 2016-01-28 DIAGNOSIS — R6889 Other general symptoms and signs: Secondary | ICD-10-CM

## 2016-01-28 DIAGNOSIS — Z23 Encounter for immunization: Secondary | ICD-10-CM | POA: Diagnosis not present

## 2016-01-28 DIAGNOSIS — G4700 Insomnia, unspecified: Secondary | ICD-10-CM

## 2016-01-28 DIAGNOSIS — F329 Major depressive disorder, single episode, unspecified: Secondary | ICD-10-CM | POA: Diagnosis not present

## 2016-01-28 DIAGNOSIS — I10 Essential (primary) hypertension: Secondary | ICD-10-CM

## 2016-01-28 DIAGNOSIS — R413 Other amnesia: Secondary | ICD-10-CM | POA: Diagnosis not present

## 2016-01-28 DIAGNOSIS — F32A Depression, unspecified: Secondary | ICD-10-CM

## 2016-01-28 DIAGNOSIS — R7303 Prediabetes: Secondary | ICD-10-CM

## 2016-01-28 DIAGNOSIS — Z9181 History of falling: Secondary | ICD-10-CM | POA: Diagnosis not present

## 2016-01-28 DIAGNOSIS — E782 Mixed hyperlipidemia: Secondary | ICD-10-CM | POA: Diagnosis not present

## 2016-01-28 DIAGNOSIS — F0280 Dementia in other diseases classified elsewhere without behavioral disturbance: Secondary | ICD-10-CM

## 2016-01-28 DIAGNOSIS — Z79899 Other long term (current) drug therapy: Secondary | ICD-10-CM

## 2016-01-28 DIAGNOSIS — Z0000 Encounter for general adult medical examination without abnormal findings: Secondary | ICD-10-CM

## 2016-01-28 LAB — HEPATIC FUNCTION PANEL
ALBUMIN: 4 g/dL (ref 3.6–5.1)
ALT: 11 U/L (ref 9–46)
AST: 14 U/L (ref 10–35)
Alkaline Phosphatase: 46 U/L (ref 40–115)
BILIRUBIN DIRECT: 0.1 mg/dL (ref <=0.2)
BILIRUBIN TOTAL: 0.4 mg/dL (ref 0.2–1.2)
Indirect Bilirubin: 0.3 mg/dL (ref 0.2–1.2)
Total Protein: 6.3 g/dL (ref 6.1–8.1)

## 2016-01-28 LAB — CBC WITH DIFFERENTIAL/PLATELET
BASOS ABS: 51 {cells}/uL (ref 0–200)
Basophils Relative: 1 %
EOS ABS: 51 {cells}/uL (ref 15–500)
EOS PCT: 1 %
HCT: 44.8 % (ref 38.5–50.0)
Hemoglobin: 15.1 g/dL (ref 13.2–17.1)
LYMPHS PCT: 26 %
Lymphs Abs: 1326 cells/uL (ref 850–3900)
MCH: 29 pg (ref 27.0–33.0)
MCHC: 33.7 g/dL (ref 32.0–36.0)
MCV: 86 fL (ref 80.0–100.0)
MONOS PCT: 7 %
MPV: 10.6 fL (ref 7.5–12.5)
Monocytes Absolute: 357 cells/uL (ref 200–950)
NEUTROS ABS: 3315 {cells}/uL (ref 1500–7800)
NEUTROS PCT: 65 %
PLATELETS: 176 10*3/uL (ref 140–400)
RBC: 5.21 MIL/uL (ref 4.20–5.80)
RDW: 14.3 % (ref 11.0–15.0)
WBC: 5.1 10*3/uL (ref 3.8–10.8)

## 2016-01-28 LAB — LIPID PANEL
CHOL/HDL RATIO: 4.5 ratio (ref <5.0)
CHOLESTEROL: 255 mg/dL — AB (ref <200)
HDL: 57 mg/dL (ref >40)
LDL Cholesterol: 169 mg/dL — ABNORMAL HIGH (ref <100)
TRIGLYCERIDES: 146 mg/dL (ref <150)
VLDL: 29 mg/dL (ref <30)

## 2016-01-28 LAB — TSH: TSH: 3.71 m[IU]/L (ref 0.40–4.50)

## 2016-01-28 LAB — BASIC METABOLIC PANEL WITH GFR
BUN: 16 mg/dL (ref 7–25)
CALCIUM: 9.5 mg/dL (ref 8.6–10.3)
CHLORIDE: 102 mmol/L (ref 98–110)
CO2: 31 mmol/L (ref 20–31)
CREATININE: 1.29 mg/dL — AB (ref 0.70–1.11)
GFR, Est African American: 60 mL/min (ref >=60)
GFR, Est Non African American: 52 mL/min — ABNORMAL LOW (ref >=60)
Glucose, Bld: 117 mg/dL — ABNORMAL HIGH (ref 65–99)
Potassium: 3.9 mmol/L (ref 3.5–5.3)
SODIUM: 142 mmol/L (ref 135–146)

## 2016-01-28 MED ORDER — ALPRAZOLAM 1 MG PO TABS: 0.5000 mg | ORAL_TABLET | Freq: Every evening | ORAL | 1 refills | Status: DC | PRN

## 2016-01-28 NOTE — Progress Notes (Signed)
MEDICARE ANNUAL WELLNESS VISIT AND FOLLOW UP Assessment:    1. Insomnia, unspecified type -refilled meds - ALPRAZolam (XANAX) 1 MG tablet; Take 0.5-1 tablets (0.5-1 mg total) by mouth at bedtime as needed.  Dispense: 90 tablet; Refill: 1  2. Essential hypertension -cont meds -dash diet -exercise as tolerated -monitor at home -goal less than 150/90, was rushing to get here today - TSH  3. Gastroesophageal reflux disease, esophagitis presence not specified -cont meds   4. 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 SDAT (senile dementia of Alzheimer's type) -on namzeric  6. At high risk for falls -patient refuses cane  7. Depression, controlled -cont alprazolam prn  8. Mixed hyperlipidemia -cont statin - Lipid panel  9. Medication management  - CBC with Differential/Platelet - BASIC METABOLIC PANEL WITH GFR - Hepatic function panel  10. Memory impairment -see SDAT  11. Prediabetes -cont diet and exercise - Hemoglobin A1c  12. Testosterone deficiency -no longer on replacement -not age appropriate  52. Vitamin D deficiency -cont Vit D  14. Need for prophylactic vaccination and inoculation against influenza  - Flu Vaccine QUAD with presevative  15. Need for prophylactic vaccination against Streptococcus pneumoniae (pneumococcus)  - Pneumococcal conjugate vaccine 13-valent    Over 30 minutes of exam, counseling, chart review, and critical decision making was performed  Future Appointments Date Time Provider Hope  06/27/2016 9:00 AM Unk Pinto, MD [facility] None     Plan:   During the course of the visit the patient was educated and counseled about appropriate screening and preventive services including:    Pneumococcal vaccine   Influenza vaccine  Prevnar 13  Td vaccine  Screening electrocardiogram  Colorectal cancer screening  Diabetes  screening  Glaucoma screening  Nutrition counseling    Subjective:  Lance Ramirez is a 82 y.o. male who presents for Medicare Annual Wellness Visit and 3 month follow up for HTN, hyperlipidemia, prediabetes, and vitamin D Def.   His blood pressure has been controlled at home, today their BP is BP: (!) 158/66 He does not workout. He denies chest pain, shortness of breath, dizziness.  He is on cholesterol medication and denies myalgias. His cholesterol is at goal. The cholesterol last visit was:  Lab Results  Component Value Date   CHOL 152 09/24/2015   HDL 61 09/24/2015   LDLCALC 79 09/24/2015   TRIG 61 09/24/2015   CHOLHDL 2.5 09/24/2015   He has been working on his blood sugars at home.  He is currently diet controlled, but had recently crossed to the diabetic range.   Lab Results  Component Value Date   HGBA1C 6.5 (H) 09/24/2015   Last GFR Lab Results  Component Value Date   GFRNONAA 43 (L) 09/24/2015     Lab Results  Component Value Date   GFRAA 49 (L) 09/24/2015   Patient is on Vitamin D supplement.   Lab Results  Component Value Date   VD25OH 89 06/14/2015     He has been having some soreness in his nipple.  He has been complaining to his wife.  He does not think that it has changed in size or color.  He has no discharge.  He reports that he is not bothered by clothing.  It hurts when he rub it.  No family history of breast cancer.  No lumps or rashes on the skin that he is aware of.    Medication Review: Current Outpatient  Prescriptions on File Prior to Visit  Medication Sig Dispense Refill  . ALPRAZolam (XANAX) 1 MG tablet TAKE 1/2 TO 1 TABLET BY MOUTH AT BEDTIME AS NEEDED 90 tablet 1  . amphetamine-dextroamphetamine (ADDERALL) 20 MG tablet Take 1/2 to 1 tablet twice daily as needed for alertness and depression. 60 tablet 0  . aspirin EC 81 MG tablet Take 81 mg by mouth daily.    . bisoprolol-hydrochlorothiazide (ZIAC) 10-6.25 MG tablet Take 1 tablet by mouth  daily. 90 tablet 0  . Cholecalciferol (VITAMIN D-3) 5000 UNITS TABS Take 1 tablet by mouth 2 (two) times daily.    . clopidogrel (PLAVIX) 75 MG tablet Take 1 tablet (75 mg total) by mouth daily. 90 tablet 3  . memantine (NAMENDA TITRATION PAK) tablet pack 5 mg/day for =1 week; 5 mg twice daily for =1 week; 15 mg/day given in 5 mg and 10 mg separated doses for =1 week; then 10 mg twice daily 49 tablet 12  . minoxidil (LONITEN) 10 MG tablet TAKE 1/2 TO 1 TABLET DAILY FOR BLOOD PRESSURE AS DIRECTED 90 tablet 0   No current facility-administered medications on file prior to visit.     Allergies: No Known Allergies  Current Problems (verified) has Hypertension; Hyperlipidemia; CVA (cerebral infarction); GERD (gastroesophageal reflux disease); Vitamin D deficiency; Testosterone deficiency; Prediabetes; Medication management; Depression, controlled; At high risk for falls; Memory impairment; and SDAT (senile dementia of Alzheimer's type) on his problem list.  Screening Tests Immunization History  Administered Date(s) Administered  . DT 11/28/2008  . Pneumococcal Polysaccharide-23 11/28/2008  . Zoster 09/16/2006    Preventative care: Last colonoscopy: 2009  Prior vaccinations: TD or Tdap: 2010  Influenza: 2017  Pneumococcal: 2010 Prevnar13: 2017 Shingles/Zostavax: 2008  Names of Other Physician/Practitioners you currently use: 1. [facility] here for primary care 2. Does not regularly see , eye doctor, last visit 2012 3. 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 Family His family history includes Other in his father; Sudden death in his mother. Social history  He reports that he quit smoking about 37 years ago. His smoking use included Cigarettes. He has a 30.00 pack-year smoking history. He quit smokeless tobacco use about 37 years ago. His smokeless tobacco use included Chew. He reports that he does not drink alcohol or use drugs.  MEDICARE WELLNESS OBJECTIVES: Physical activity:   Cardiac risk factors:   Depression/mood screen:   Depression screen [facility] 2/9 06/14/2015  Decreased Interest 0  Down, Depressed, Hopeless 0  PHQ - 2 Score 0    ADLs:  In your present state of health, do you have any difficulty performing the following activities: 09/22/2015 06/14/2015  Hearing? N N  Vision? N N  Difficulty concentrating or making decisions? Tempie Donning  Walking or climbing stairs? Y N  Dressing or bathing? N N  Doing errands, shopping? Y Y  Some recent data might be hidden     Cognitive Testing  Alert? Yes  Normal Appearance?Yes  Oriented to person? Yes  Place? Yes   Time? Yes  Recall of three objects?  Yes  Can perform simple  calculations? Yes  Displays appropriate judgment?Yes  Can read the correct time from a watch face?Yes  EOL planning:     Objective:   Today's Vitals   01/28/16 1152  BP: (!) 158/66  Pulse: 94  Resp: 16  Temp: 98 F (36.7 C)  TempSrc: Temporal  Weight: 200 lb (90.7 kg)  Height: 5' 9.5" (1.765 m)   Body mass index is 29.11 kg/m.  General appearance: alert, no distress, WD/WN, male HEENT: normocephalic, sclerae anicteric, TMs pearly, nares patent, no discharge or erythema, pharynx normal Oral cavity: MMM, no lesions Neck: supple, no lymphadenopathy, no thyromegaly, no masses Heart: RRR, normal S1, S2, no murmurs Lungs: CTA bilaterally, no wheezes, rhonchi, or rales.  Bilateral breast exam without palpable lumps.  NO nipple retraction.   No nipple discharge.  Nontender on examination Abdomen: +bs, soft, non tender, non distended, no masses, no hepatomegaly, no splenomegaly Musculoskeletal: nontender, no swelling, no obvious deformity Extremities: no edema, no cyanosis, no clubbing Pulses: 2+ symmetric, upper and lower extremities, normal cap refill Neurological: alert, oriented x 3, CN2-12 intact, strength normal upper extremities and lower extremities, sensation normal throughout, DTRs 2+ throughout, no cerebellar signs, gait normal Psychiatric: normal affect, behavior normal, pleasant   Medicare Attestation I have personally reviewed: The patient's medical and social history Their use of alcohol, tobacco or illicit drugs Their current medications and supplements The patient's functional ability including ADLs,fall risks, home safety risks, cognitive, and hearing and visual impairment Diet and physical activities Evidence for depression or mood disorders  The patient's weight, height, BMI, and visual acuity have been recorded in the chart.  I have made referrals, counseling, and provided education to the patient based on review of the above and I have provided the patient with a written personalized care plan for preventive services.     Starlyn Skeans, PA-C   01/28/2016

## 2016-01-29 LAB — HEMOGLOBIN A1C
Hgb A1c MFr Bld: 5.8 % — ABNORMAL HIGH (ref <5.7)
MEAN PLASMA GLUCOSE: 120 mg/dL

## 2016-02-06 ENCOUNTER — Other Ambulatory Visit: Payer: Self-pay | Admitting: Internal Medicine

## 2016-02-06 DIAGNOSIS — N644 Mastodynia: Secondary | ICD-10-CM

## 2016-02-07 ENCOUNTER — Other Ambulatory Visit: Payer: Self-pay | Admitting: Internal Medicine

## 2016-02-07 DIAGNOSIS — N644 Mastodynia: Secondary | ICD-10-CM

## 2016-02-11 ENCOUNTER — Encounter: Payer: Self-pay | Admitting: Internal Medicine

## 2016-02-11 ENCOUNTER — Other Ambulatory Visit: Payer: Self-pay | Admitting: Internal Medicine

## 2016-02-11 DIAGNOSIS — N644 Mastodynia: Secondary | ICD-10-CM

## 2016-02-14 ENCOUNTER — Ambulatory Visit
Admission: RE | Admit: 2016-02-14 | Discharge: 2016-02-14 | Disposition: A | Discharge status: Home / Self Care | Payer: PPO | Source: Ambulatory Visit | Attending: Internal Medicine | Admitting: Internal Medicine

## 2016-02-14 DIAGNOSIS — N62 Hypertrophy of breast: Secondary | ICD-10-CM | POA: Diagnosis not present

## 2016-02-14 DIAGNOSIS — N644 Mastodynia: Secondary | ICD-10-CM

## 2016-02-18 ENCOUNTER — Other Ambulatory Visit: Payer: Self-pay | Admitting: *Deleted

## 2016-02-18 DIAGNOSIS — G4700 Insomnia, unspecified: Secondary | ICD-10-CM

## 2016-02-18 MED ORDER — ALPRAZOLAM 1 MG PO TABS: 0.5000 mg | ORAL_TABLET | Freq: Every evening | ORAL | 1 refills | Status: DC | PRN

## 2016-02-19 ENCOUNTER — Other Ambulatory Visit: Payer: Self-pay | Admitting: *Deleted

## 2016-02-19 DIAGNOSIS — G4700 Insomnia, unspecified: Secondary | ICD-10-CM

## 2016-02-19 MED ORDER — ALPRAZOLAM 1 MG PO TABS: 0.5000 mg | ORAL_TABLET | Freq: Every evening | ORAL | 1 refills | Status: DC | PRN

## 2016-04-26 ENCOUNTER — Other Ambulatory Visit: Payer: Self-pay | Admitting: Internal Medicine

## 2016-04-29 ENCOUNTER — Other Ambulatory Visit: Payer: Self-pay | Admitting: Physician Assistant

## 2016-04-29 DIAGNOSIS — I10 Essential (primary) hypertension: Secondary | ICD-10-CM

## 2016-05-08 ENCOUNTER — Other Ambulatory Visit: Payer: Self-pay | Admitting: *Deleted

## 2016-05-08 DIAGNOSIS — G4700 Insomnia, unspecified: Secondary | ICD-10-CM

## 2016-05-08 MED ORDER — ALPRAZOLAM 1 MG PO TABS: 0.5000 mg | ORAL_TABLET | Freq: Every evening | ORAL | 0 refills | Status: DC | PRN

## 2016-05-09 ENCOUNTER — Other Ambulatory Visit: Payer: Self-pay | Admitting: *Deleted

## 2016-05-09 MED ORDER — BISOPROLOL-HYDROCHLOROTHIAZIDE 10-6.25 MG PO TABS: 1.0000 | ORAL_TABLET | Freq: Every day | ORAL | 0 refills | Status: DC

## 2016-05-16 ENCOUNTER — Other Ambulatory Visit: Payer: Self-pay | Admitting: *Deleted

## 2016-05-16 DIAGNOSIS — I10 Essential (primary) hypertension: Secondary | ICD-10-CM

## 2016-05-16 MED ORDER — MINOXIDIL 10 MG PO TABS: ORAL_TABLET | ORAL | 0 refills | Status: DC

## 2016-05-26 ENCOUNTER — Other Ambulatory Visit: Payer: Self-pay | Admitting: Internal Medicine

## 2016-06-16 ENCOUNTER — Other Ambulatory Visit: Payer: Self-pay | Admitting: Internal Medicine

## 2016-06-16 MED ORDER — CLOPIDOGREL BISULFATE 75 MG PO TABS: 75.0000 mg | ORAL_TABLET | Freq: Every day | ORAL | 3 refills | Status: DC

## 2016-06-27 ENCOUNTER — Ambulatory Visit (INDEPENDENT_AMBULATORY_CARE_PROVIDER_SITE_OTHER): Payer: PPO | Admitting: Internal Medicine

## 2016-06-27 ENCOUNTER — Encounter: Payer: Self-pay | Admitting: Internal Medicine

## 2016-06-27 VITALS — BP 138/66 | HR 72 | Temp 97.3°F | Resp 16 | Ht 70.0 in | Wt 202.6 lb

## 2016-06-27 DIAGNOSIS — R7303 Prediabetes: Secondary | ICD-10-CM | POA: Diagnosis not present

## 2016-06-27 DIAGNOSIS — Z1212 Encounter for screening for malignant neoplasm of rectum: Secondary | ICD-10-CM

## 2016-06-27 DIAGNOSIS — F329 Major depressive disorder, single episode, unspecified: Secondary | ICD-10-CM

## 2016-06-27 DIAGNOSIS — G301 Alzheimer's disease with late onset: Secondary | ICD-10-CM

## 2016-06-27 DIAGNOSIS — Z125 Encounter for screening for malignant neoplasm of prostate: Secondary | ICD-10-CM

## 2016-06-27 DIAGNOSIS — I6359 Cerebral infarction due to unspecified occlusion or stenosis of other cerebral artery: Secondary | ICD-10-CM

## 2016-06-27 DIAGNOSIS — Z0000 Encounter for general adult medical examination without abnormal findings: Secondary | ICD-10-CM | POA: Diagnosis not present

## 2016-06-27 DIAGNOSIS — E782 Mixed hyperlipidemia: Secondary | ICD-10-CM | POA: Diagnosis not present

## 2016-06-27 DIAGNOSIS — Z0001 Encounter for general adult medical examination with abnormal findings: Secondary | ICD-10-CM

## 2016-06-27 DIAGNOSIS — F32A Depression, unspecified: Secondary | ICD-10-CM

## 2016-06-27 DIAGNOSIS — E559 Vitamin D deficiency, unspecified: Secondary | ICD-10-CM | POA: Diagnosis not present

## 2016-06-27 DIAGNOSIS — F0280 Dementia in other diseases classified elsewhere without behavioral disturbance: Secondary | ICD-10-CM

## 2016-06-27 DIAGNOSIS — I10 Essential (primary) hypertension: Secondary | ICD-10-CM | POA: Diagnosis not present

## 2016-06-27 DIAGNOSIS — Z79899 Other long term (current) drug therapy: Secondary | ICD-10-CM | POA: Diagnosis not present

## 2016-06-27 DIAGNOSIS — Z136 Encounter for screening for cardiovascular disorders: Secondary | ICD-10-CM | POA: Diagnosis not present

## 2016-06-27 DIAGNOSIS — N320 Bladder-neck obstruction: Secondary | ICD-10-CM | POA: Diagnosis not present

## 2016-06-27 LAB — URINALYSIS, ROUTINE W REFLEX MICROSCOPIC
BILIRUBIN URINE: NEGATIVE
Glucose, UA: NEGATIVE
Hgb urine dipstick: NEGATIVE
Ketones, ur: NEGATIVE
LEUKOCYTES UA: NEGATIVE
Nitrite: NEGATIVE
SPECIFIC GRAVITY, URINE: 1.017 (ref 1.001–1.035)
pH: 6 (ref 5.0–8.0)

## 2016-06-27 LAB — URINALYSIS, MICROSCOPIC ONLY
Bacteria, UA: NONE SEEN [HPF]
CASTS: NONE SEEN [LPF]
Crystals: NONE SEEN [HPF]
RBC / HPF: NONE SEEN RBC/HPF (ref <=2)
SQUAMOUS EPITHELIAL / LPF: NONE SEEN [HPF] (ref <=5)
YEAST: NONE SEEN [HPF]

## 2016-06-27 LAB — BASIC METABOLIC PANEL WITH GFR
BUN: 27 mg/dL — ABNORMAL HIGH (ref 7–25)
CO2: 27 mmol/L (ref 20–31)
Calcium: 10.2 mg/dL (ref 8.6–10.3)
Chloride: 106 mmol/L (ref 98–110)
Creat: 1.74 mg/dL — ABNORMAL HIGH (ref 0.70–1.11)
GFR, EST NON AFRICAN AMERICAN: 36 mL/min — AB (ref >=60)
GFR, Est African American: 42 mL/min — ABNORMAL LOW (ref >=60)
Glucose, Bld: 133 mg/dL — ABNORMAL HIGH (ref 65–99)
POTASSIUM: 4 mmol/L (ref 3.5–5.3)
SODIUM: 142 mmol/L (ref 135–146)

## 2016-06-27 LAB — CBC WITH DIFFERENTIAL/PLATELET
BASOS ABS: 52 {cells}/uL (ref 0–200)
BASOS PCT: 1 %
Eosinophils Absolute: 156 cells/uL (ref 15–500)
Eosinophils Relative: 3 %
HCT: 46.8 % (ref 38.5–50.0)
HEMOGLOBIN: 15.5 g/dL (ref 13.2–17.1)
LYMPHS ABS: 1716 {cells}/uL (ref 850–3900)
Lymphocytes Relative: 33 %
MCH: 28.7 pg (ref 27.0–33.0)
MCHC: 33.1 g/dL (ref 32.0–36.0)
MCV: 86.7 fL (ref 80.0–100.0)
MONOS PCT: 10 %
MPV: 11.1 fL (ref 7.5–12.5)
Monocytes Absolute: 520 cells/uL (ref 200–950)
NEUTROS ABS: 2756 {cells}/uL (ref 1500–7800)
Neutrophils Relative %: 53 %
PLATELETS: 141 10*3/uL (ref 140–400)
RBC: 5.4 MIL/uL (ref 4.20–5.80)
RDW: 15.6 % — ABNORMAL HIGH (ref 11.0–15.0)
WBC: 5.2 10*3/uL (ref 3.8–10.8)

## 2016-06-27 LAB — HEPATIC FUNCTION PANEL
ALT: 10 U/L (ref 9–46)
AST: 14 U/L (ref 10–35)
Albumin: 4 g/dL (ref 3.6–5.1)
Alkaline Phosphatase: 44 U/L (ref 40–115)
BILIRUBIN DIRECT: 0.1 mg/dL (ref <=0.2)
BILIRUBIN INDIRECT: 0.4 mg/dL (ref 0.2–1.2)
BILIRUBIN TOTAL: 0.5 mg/dL (ref 0.2–1.2)
Total Protein: 6.1 g/dL (ref 6.1–8.1)

## 2016-06-27 LAB — MAGNESIUM: MAGNESIUM: 2 mg/dL (ref 1.5–2.5)

## 2016-06-27 LAB — TSH: TSH: 8.13 mIU/L — ABNORMAL HIGH (ref 0.40–4.50)

## 2016-06-27 LAB — PSA: PSA: 1.9 ng/mL (ref <=4.0)

## 2016-06-27 NOTE — Patient Instructions (Signed)
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
## 2016-06-28 LAB — MICROALBUMIN / CREATININE URINE RATIO
Creatinine, Urine: 170 mg/dL (ref 20–370)
Microalb Creat Ratio: 32 mcg/mg creat — ABNORMAL HIGH (ref <30)
Microalb, Ur: 5.4 mg/dL

## 2016-06-28 LAB — HEMOGLOBIN A1C
Hgb A1c MFr Bld: 5.9 % — ABNORMAL HIGH (ref <5.7)
MEAN PLASMA GLUCOSE: 123 mg/dL

## 2016-06-28 LAB — VITAMIN D 25 HYDROXY (VIT D DEFICIENCY, FRACTURES): VIT D 25 HYDROXY: 65 ng/mL (ref 30–100)

## 2016-06-30 LAB — INSULIN, RANDOM: INSULIN: 11.8 u[IU]/mL (ref 2.0–19.6)

## 2016-07-08 ENCOUNTER — Other Ambulatory Visit: Payer: Self-pay

## 2016-07-08 ENCOUNTER — Other Ambulatory Visit: Payer: Self-pay | Admitting: Physician Assistant

## 2016-07-08 ENCOUNTER — Telehealth: Payer: Self-pay | Admitting: *Deleted

## 2016-07-08 MED ORDER — AMPHETAMINE-DEXTROAMPHETAMINE 20 MG PO TABS: ORAL_TABLET | ORAL | 0 refills | Status: DC

## 2016-07-08 NOTE — Telephone Encounter (Signed)
Per Fidela Salisbury, mail the Adderall RX to the patient's home at [place], [place] [address].

## 2016-07-14 ENCOUNTER — Emergency Department (HOSPITAL_COMMUNITY)
Admission: EM | Admit: 2016-07-14 | Discharge: 2016-07-14 | Disposition: A | Discharge status: Home / Self Care | Payer: PPO | Attending: Emergency Medicine | Admitting: Emergency Medicine

## 2016-07-14 ENCOUNTER — Encounter (HOSPITAL_COMMUNITY): Payer: Self-pay | Admitting: Emergency Medicine

## 2016-07-14 DIAGNOSIS — Z7982 Long term (current) use of aspirin: Secondary | ICD-10-CM | POA: Insufficient documentation

## 2016-07-14 DIAGNOSIS — I10 Essential (primary) hypertension: Secondary | ICD-10-CM | POA: Diagnosis not present

## 2016-07-14 DIAGNOSIS — F918 Other conduct disorders: Secondary | ICD-10-CM | POA: Diagnosis not present

## 2016-07-14 DIAGNOSIS — Z87891 Personal history of nicotine dependence: Secondary | ICD-10-CM | POA: Insufficient documentation

## 2016-07-14 DIAGNOSIS — Z8673 Personal history of transient ischemic attack (TIA), and cerebral infarction without residual deficits: Secondary | ICD-10-CM | POA: Insufficient documentation

## 2016-07-14 DIAGNOSIS — F4321 Adjustment disorder with depressed mood: Secondary | ICD-10-CM | POA: Diagnosis not present

## 2016-07-14 DIAGNOSIS — Z79899 Other long term (current) drug therapy: Secondary | ICD-10-CM | POA: Insufficient documentation

## 2016-07-14 LAB — COMPREHENSIVE METABOLIC PANEL
ALK PHOS: 48 U/L (ref 38–126)
ALT: 14 U/L — ABNORMAL LOW (ref 17–63)
AST: 16 U/L (ref 15–41)
Albumin: 4.1 g/dL (ref 3.5–5.0)
Anion gap: 8 (ref 5–15)
BILIRUBIN TOTAL: 0.4 mg/dL (ref 0.3–1.2)
BUN: 24 mg/dL — AB (ref 6–20)
CALCIUM: 9.5 mg/dL (ref 8.9–10.3)
CO2: 28 mmol/L (ref 22–32)
CREATININE: 1.44 mg/dL — AB (ref 0.61–1.24)
Chloride: 106 mmol/L (ref 101–111)
GFR calc Af Amer: 51 mL/min — ABNORMAL LOW (ref >60)
GFR calc non Af Amer: 44 mL/min — ABNORMAL LOW (ref >60)
GLUCOSE: 150 mg/dL — AB (ref 65–99)
Potassium: 3.4 mmol/L — ABNORMAL LOW (ref 3.5–5.1)
Sodium: 142 mmol/L (ref 135–145)
TOTAL PROTEIN: 7.1 g/dL (ref 6.5–8.1)

## 2016-07-14 LAB — ETHANOL: Alcohol, Ethyl (B): <5 mg/dL (ref <5)

## 2016-07-14 LAB — RAPID URINE DRUG SCREEN, HOSP PERFORMED
Amphetamines: NOT DETECTED
BARBITURATES: NOT DETECTED
BENZODIAZEPINES: POSITIVE — AB
Cocaine: NOT DETECTED
Opiates: NOT DETECTED
Tetrahydrocannabinol: NOT DETECTED

## 2016-07-14 LAB — CBC
HEMATOCRIT: 45.9 % (ref 39.0–52.0)
Hemoglobin: 15.6 g/dL (ref 13.0–17.0)
MCH: 28.9 pg (ref 26.0–34.0)
MCHC: 34 g/dL (ref 30.0–36.0)
MCV: 85 fL (ref 78.0–100.0)
Platelets: 148 10*3/uL — ABNORMAL LOW (ref 150–400)
RBC: 5.4 MIL/uL (ref 4.22–5.81)
RDW: 13.9 % (ref 11.5–15.5)
WBC: 5.2 10*3/uL (ref 4.0–10.5)

## 2016-07-14 LAB — ACETAMINOPHEN LEVEL: Acetaminophen (Tylenol), Serum: <10 ug/mL — ABNORMAL LOW (ref 10–30)

## 2016-07-14 LAB — SALICYLATE LEVEL: Salicylate Lvl: <7 mg/dL (ref 2.8–30.0)

## 2016-07-14 MED ORDER — CITALOPRAM HYDROBROMIDE 10 MG PO TABS: 10.0000 mg | ORAL_TABLET | Freq: Every day | ORAL | 0 refills | Status: DC

## 2016-07-14 MED ORDER — CITALOPRAM HYDROBROMIDE 10 MG PO TABS: 10.0000 mg | ORAL_TABLET | Freq: Every day | ORAL | Status: DC

## 2016-07-14 MED ORDER — ACETAMINOPHEN 325 MG PO TABS: 650.0000 mg | ORAL_TABLET | ORAL | Status: DC | PRN

## 2016-07-14 MED ORDER — ONDANSETRON HCL 4 MG PO TABS: 4.0000 mg | ORAL_TABLET | Freq: Three times a day (TID) | ORAL | Status: DC | PRN

## 2016-07-14 MED ORDER — LORAZEPAM 1 MG PO TABS: 1.0000 mg | ORAL_TABLET | Freq: Three times a day (TID) | ORAL | Status: DC | PRN
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
## 2016-08-01 ENCOUNTER — Ambulatory Visit: Payer: Self-pay

## 2016-08-14 ENCOUNTER — Other Ambulatory Visit: Payer: Self-pay | Admitting: *Deleted

## 2016-08-14 MED ORDER — CITALOPRAM HYDROBROMIDE 20 MG PO TABS: 20.0000 mg | ORAL_TABLET | Freq: Every day | ORAL | 1 refills | Status: DC

## 2016-09-01 ENCOUNTER — Other Ambulatory Visit: Payer: Self-pay | Admitting: *Deleted

## 2016-09-01 ENCOUNTER — Other Ambulatory Visit: Payer: Self-pay | Admitting: Internal Medicine

## 2016-09-01 DIAGNOSIS — I10 Essential (primary) hypertension: Secondary | ICD-10-CM

## 2016-09-01 MED ORDER — BISOPROLOL-HYDROCHLOROTHIAZIDE 10-6.25 MG PO TABS: 1.0000 | ORAL_TABLET | Freq: Every day | ORAL | 0 refills | Status: DC

## 2016-09-08 ENCOUNTER — Other Ambulatory Visit: Payer: Self-pay | Admitting: Internal Medicine

## 2016-09-08 DIAGNOSIS — I10 Essential (primary) hypertension: Secondary | ICD-10-CM

## 2016-09-25 ENCOUNTER — Telehealth: Payer: Self-pay | Admitting: *Deleted

## 2016-09-25 ENCOUNTER — Other Ambulatory Visit: Payer: Self-pay | Admitting: *Deleted

## 2016-09-25 MED ORDER — AMPHETAMINE-DEXTROAMPHETAMINE 20 MG PO TABS: ORAL_TABLET | ORAL | 0 refills | Status: DC

## 2016-09-25 MED ORDER — BISOPROLOL-HYDROCHLOROTHIAZIDE 10-6.25 MG PO TABS: 1.0000 | ORAL_TABLET | Freq: Every day | ORAL | 0 refills | Status: DC

## 2016-09-25 NOTE — Telephone Encounter (Signed)
RX for Adderall mailed to the patient's daughter's address, per the spouse.

## 2016-10-20 ENCOUNTER — Ambulatory Visit: Payer: Self-pay | Admitting: Physician Assistant

## 2016-11-03 ENCOUNTER — Ambulatory Visit (INDEPENDENT_AMBULATORY_CARE_PROVIDER_SITE_OTHER): Payer: PPO | Admitting: Physician Assistant

## 2016-11-03 ENCOUNTER — Encounter: Payer: Self-pay | Admitting: Physician Assistant

## 2016-11-03 VITALS — BP 132/76 | HR 70 | Temp 97.7°F | Resp 14 | Ht 70.0 in | Wt 199.0 lb

## 2016-11-03 DIAGNOSIS — I10 Essential (primary) hypertension: Secondary | ICD-10-CM

## 2016-11-03 DIAGNOSIS — F3341 Major depressive disorder, recurrent, in partial remission: Secondary | ICD-10-CM

## 2016-11-03 DIAGNOSIS — Z23 Encounter for immunization: Secondary | ICD-10-CM | POA: Diagnosis not present

## 2016-11-03 DIAGNOSIS — Z79899 Other long term (current) drug therapy: Secondary | ICD-10-CM

## 2016-11-03 DIAGNOSIS — Z9181 History of falling: Secondary | ICD-10-CM | POA: Diagnosis not present

## 2016-11-03 DIAGNOSIS — R2689 Other abnormalities of gait and mobility: Secondary | ICD-10-CM

## 2016-11-03 DIAGNOSIS — E782 Mixed hyperlipidemia: Secondary | ICD-10-CM

## 2016-11-03 DIAGNOSIS — N401 Enlarged prostate with lower urinary tract symptoms: Secondary | ICD-10-CM | POA: Diagnosis not present

## 2016-11-03 MED ORDER — TERAZOSIN HCL 2 MG PO CAPS: 2.0000 mg | ORAL_CAPSULE | Freq: Every day | ORAL | 0 refills | Status: DC

## 2016-11-03 NOTE — Progress Notes (Signed)
Assessment and Plan:  Hypertension:  Add terozosin due to BPH, decrease minoxidil -Continue medication -monitor blood pressure at home. -Continue DASH diet -Reminder to go to the ER if any CP, SOB, nausea, dizziness, severe HA, changes vision/speech, left arm numbness and tingling and jaw pain.  Cholesterol - Continue diet and exercise -Check cholesterol.   Diabetes with diabetic chronic kidney disease -Continue diet and exercise.  -Check A1C  Vitamin D Def -check level -continue medications.   CVA history -cont plavix -cont tight blood pressure control  Depression, major, recurrent, in partial remission (HCC) Continue celexa 20mg s  Needs flu shot -     Flu vaccine HIGH DOSE PF  Benign prostatic hyperplasia with lower urinary tract symptoms, symptom details unspecified -     terazosin (HYTRIN) 2 MG capsule; Take 1 capsule (2 mg total) by mouth at bedtime. -     Urinalysis, Routine w reflex microscopic -     Urine Culture  Imbalance and fall risk Will schedule for in home PT since patient has dementia and it is difficult for him to go anywhere.   Continue diet and meds as discussed. Further disposition pending results of labs. Discussed med's effects and SE's.    HPI 83 y.o. male  presents for 3 month follow up with hypertension, hyperlipidemia, diabetes and vitamin D deficiency.   His blood pressure has been controlled at home, today their BP is BP: 132/76.He does workout. He denies chest pain, shortness of breath, dizziness.   He is on cholesterol medication and denies myalgias. His cholesterol is at goal. The cholesterol was:  01/28/2016: Cholesterol 255; HDL 57; LDL Cholesterol 169; Triglycerides 146   He has been working on diet and exercise for diabetes without complications, he is on bASA, he is on ACE/ARB, and denies  foot ulcerations, hyperglycemia, hypoglycemia , increased appetite, nausea, paresthesia of the feet, polydipsia, polyuria, visual disturbances,  vomiting and weight loss. Last A1C was: 06/27/2016: Hgb A1c MFr Bld 5.9   Patient is on Vitamin D supplement. 06/27/2016: Vit D, 25-Hydroxy 65  He is here with his wife today.  He has memory issues, history comes from wife, he states he is frustrated not being able to do as much as it use to be, has some leg weakness and deconditioning from not moving as much and would like PT, can not leave the house easily due to dementia/fall risk now. He has been having dribbling, frequency, urgency every 10 mins.He was taken off his doxazosin.     Current Medications:  Current Outpatient Prescriptions on File Prior to Visit  Medication Sig Dispense Refill  . ALPRAZolam (XANAX) 1 MG tablet Take 0.5-1 tablets (0.5-1 mg total) by mouth at bedtime as needed. 90 tablet 0  . amphetamine-dextroamphetamine (ADDERALL) 20 MG tablet Take 1/2 to 1 tablet twice daily as needed for alertness and depression. 60 tablet 0  . aspirin EC 81 MG tablet Take 81 mg by mouth daily.    Marland Kitchen atenolol (TENORMIN) 100 MG tablet TAKE 1 TABLET EVERY DAY 30 tablet 1  . bisoprolol-hydrochlorothiazide (ZIAC) 10-6.25 MG tablet Take 1 tablet by mouth daily. 90 tablet 0  . Cholecalciferol (VITAMIN D-3) 5000 UNITS TABS Take 1 tablet by mouth 2 (two) times daily.    . citalopram (CELEXA) 20 MG tablet Take 1 tablet (20 mg total) by mouth daily. 90 tablet 1  . clopidogrel (PLAVIX) 75 MG tablet Take 1 tablet (75 mg total) by mouth daily. 90 tablet 3  . minoxidil (LONITEN) 10  MG tablet TAKE 1/2 TO 1 TABLET DAILY FOR BLOOD PRESSURE AS DIRECTED 90 tablet 0  . memantine (NAMENDA TITRATION PAK) tablet pack 5 mg/day for =1 week; 5 mg twice daily for =1 week; 15 mg/day given in 5 mg and 10 mg separated doses for =1 week; then 10 mg twice daily (Patient not taking: Reported on 11/03/2016) 49 tablet 12   No current facility-administered medications on file prior to visit.    Medical History:  Past Medical History:  Diagnosis Date  . Arthritis   . Cerebral  embolism with cerebral infarction ([place]) 02/10/2011  . Confusion   . GERD (gastroesophageal reflux disease)   . H/O dizziness   . Hyperlipidemia   . Hypertension   . Other testicular hypofunction   . Stroke ([place]) 05/2012   affected memory  . TIA (transient ischemic attack)   . Vitamin D deficiency    Allergies: No Known Allergies   Review of Systems:  Review of Systems  Constitutional: Negative for chills, fever and malaise/fatigue.  HENT: Negative for congestion, ear pain and sore throat.   Eyes: Negative.   Respiratory: Negative for cough, shortness of breath and wheezing.   Cardiovascular: Negative for chest pain, palpitations and leg swelling.  Gastrointestinal: Negative for abdominal pain, blood in stool, constipation, diarrhea, heartburn and melena.  Genitourinary: Negative.   Skin: Negative.   Neurological: Positive for dizziness. Negative for sensory change, loss of consciousness and headaches.  Psychiatric/Behavioral: Positive for memory loss. Negative for depression. The patient is not nervous/anxious and does not have insomnia.     Family history- Review and unchanged  Social history- Review and unchanged  Physical Exam: BP 132/76   Pulse 70   Temp 97.7 F (36.5 C)   Resp 14   Ht 5\' 10"  (1.778 m)   Wt 199 lb (90.3 kg)   SpO2 97%   BMI 28.55 kg/m  Wt Readings from Last 3 Encounters:  11/03/16 199 lb (90.3 kg)  06/27/16 202 lb 9.6 oz (91.9 kg)  01/28/16 200 lb (90.7 kg)   General Appearance: Well nourished well developed, non-toxic appearing, in no apparent distress. Eyes: PERRLA, EOMs, conjunctiva no swelling or erythema ENT/Mouth: Ear canals clear with no erythema, swelling, or discharge.  TMs normal bilaterally, oropharynx clear, moist, with no exudate.   Neck: Supple, thyroid normal, no JVD, no cervical adenopathy.  Respiratory: Respiratory effort normal, breath sounds clear A&P, no wheeze, rhonchi or rales noted.  No retractions, no accessory muscle  usage Cardio: RRR with no MRGs. No noted edema.  Abdomen: Soft, + BS.  Non tender, no guarding, rebound, hernias, masses. Musculoskeletal: Full ROM, 5/5 strength, Normal gait Skin: Warm, dry without rashes, lesions, ecchymosis.  Neuro: Awake and oriented X 3, Cranial nerves intact. No cerebellar symptoms.  Psych: normal affect, Insight and Judgment appropriate.    Vicie Mutters, PA-C 2:51 PM [facility]

## 2016-11-03 NOTE — Patient Instructions (Addendum)
Cut the minoxidil in half Start on terazosin 2 mg at night, can go up to 4 mg at night after 3-5 nights  If you get dizzy with the doxazosin go back to the previous dose. This medication can cause hypotension so be careful with standing in the beginning and decrease the dose if you get dizzy. Please drink plenty of fluids!!!  If this does not help or you have side effects stop it and go back up on minoxidil   Benign Prostatic Hyperplasia Benign prostatic hyperplasia is when the prostate gland is bigger than normal (enlarged). The prostate is a gland that produces the fluid that goes into semen. It is near the opening to the bladder and it surrounds the tube that drains urine out of the body (urethra). Benign prostatic hyperplasia is common among older men and it typically causes problems with urinating. The prostate grows slowly as you age. As the prostate grows, it can pinch the urethra. This causes the bladder to work too hard to pass urine, which leads to a thickened bladder wall. The bladder may eventually become weak and unable to empty completely. What are the causes? The exact cause of this condition is not known. It may be related to changes in hormones as the body ages. What increases the risk? You are more likely to develop this condition if:  You have a family history of the condition.  You are age 81 or older.  You have a history of erectile dysfunction.  You do not exercise.  You have certain medical conditions, including: ? Type 2 diabetes. ? Obesity. ? Heart and circulatory disease.  What are the signs or symptoms? Symptoms of this condition include:  Weak or interrupted urine stream.  Dribbling or leaking urine.  Feeling like the bladder has not emptied completely.  Difficulty starting urination.  Getting up frequently at night to urinate.  Urinating more often (8 or more times a day).  Accidental loss of urine (urinary incontinence).  Pain during  urination or ejaculation.  Urine with an unusual smell or color.  The size of the prostate does not always determine the severity of the symptoms. For example, a man with a large prostate may experience minor symptoms, or a man with a smaller prostate may experience a severe blockage. How is this diagnosed? This condition may be diagnosed based on:  Your medical history and symptoms.  A physical exam. This usually includes a digital rectal exam. During this exam, your health care provider places a gloved, lubricated finger into the rectum to feel the size of the prostate.  A blood test. This test checks for high levels of a protein that is produced by the prostate (prostate specific antigen, PSA).  Tests to examine how well the urethra and bladder are functioning (urodynamic tests).  Cystoscopy. For this test, a small, tube-shaped instrument (cystoscope) is used to look inside the urethra and bladder. The cystoscope is placed into the urinary tract through the opening at the tip of the penis.  Urine tests.  Ultrasound.  How is this treated? Treatment for this condition depends on how severe your symptoms are. Treatment may include:  Active surveillance or "watchful waiting." If your symptoms are mild, your health care provider may delay treatment and ask you to keep track of your symptoms. You will have regular checkups to examine the size of your prostate, discuss symptoms, and determine whether treatment is needed.  Medicines. These may be used to: ? Stop prostate growth. ?  Shrink the prostate. ? Relieve symptoms.  Lifestyle changes, including: ? Pelvic floor muscle exercises. The pelvic floor muscles are a group of muscles that relax when you urinate. ? Bladder training. This involves exercises that train the bladder to hold more urine for longer periods. ? Reducing the amount of liquid that you drink. This is especially important before sleeping and before long periods of time  spent in public. ? Reducing the amount of caffeine and alcohol that you drink. ? Treating or preventing constipation.  Surgery to reduce the size of the prostate or widen the urethra. This is typically done if your symptoms are severe or there are serious complications from the enlarged prostate.  Follow these instructions at home: Medicines  Take over-the-counter and prescription medicines as told by your health care provider.  Avoid certain medicines, such as decongestants, antihistamines, and some prescription medicines as told by your health care provider. Ask your health care provider which medicines you should avoid. General instructions  Monitor your symptoms for any changes. Tell your health care provider about any changes.  Give yourself time when you urinate.  Avoid certain beverages that can irritate the bladder, such as: ? Alcohol. ? Caffeinated drinks like coffee, tea, and cola.  Avoid drinking large amounts of liquid before bed or before going out in public.  Do pelvic floor muscle or bladder training exercises as told by your health care provider.  Keep all follow-up visits as told by your health care provider. This is important. Contact a health care provider if:  Your develop new or worse symptoms.  You have trouble getting or maintaining an erection.  You have a fever.  You have pain or burning during urination.  You have blood in your urine. Get help right away if:  You have severe pain when urinating.  You cannot urinate.  You have severe pain in your abdomen.  You are dizzy.  You faint.  You have severe back pain.  Your urine is dark red and difficult to see through.  You have large blood clots in your urine.  You have severe pain after an erection.  You have chest pain, dizziness, or nausea during sexual activity. Summary  The prostate is a gland that produces the fluid that goes into semen. It is near the opening to the bladder and  it surrounds the tube that drains urine out of the body (urethra).  Benign prostatic hyperplasia is common among older men and it typically causes problems with urinating.  If your symptoms are mild, your health care provider may delay treatment and ask you to keep track of your symptoms. You will have regular checkups to examine the size of your prostate, discuss symptoms, and determine whether treatment is needed.  If directed, you may need to avoid certain medicines, such as decongestants, antihistamines, and some prescription medicines.  Contact your health care provider if you develop new or worse symptoms. This information is not intended to replace advice given to you by your health care provider. Make sure you discuss any questions you have with your health care provider. Document Released: 02/24/2005 Document Revised: 01/14/2016 Document Reviewed: 01/14/2016 Elsevier Interactive Patient Education  2017 [employer].

## 2016-11-04 NOTE — Progress Notes (Signed)
Pt's wife was made aware of lab results & voiced understanding. Pt's wife is aware of pending labs

## 2016-11-06 LAB — CBC WITH DIFFERENTIAL/PLATELET
BASOS ABS: 42 {cells}/uL (ref 0–200)
Basophils Relative: 0.9 %
EOS PCT: 1.3 %
Eosinophils Absolute: 61 cells/uL (ref 15–500)
HEMATOCRIT: 43 % (ref 38.5–50.0)
Hemoglobin: 14.3 g/dL (ref 13.2–17.1)
LYMPHS ABS: 1175 {cells}/uL (ref 850–3900)
MCH: 28 pg (ref 27.0–33.0)
MCHC: 33.3 g/dL (ref 32.0–36.0)
MCV: 84.1 fL (ref 80.0–100.0)
MONOS PCT: 7.5 %
MPV: 11.8 fL (ref 7.5–12.5)
NEUTROS ABS: 3069 {cells}/uL (ref 1500–7800)
Neutrophils Relative %: 65.3 %
PLATELETS: 172 10*3/uL (ref 140–400)
RBC: 5.11 10*6/uL (ref 4.20–5.80)
RDW: 13.5 % (ref 11.0–15.0)
Total Lymphocyte: 25 %
WBC mixed population: 353 cells/uL (ref 200–950)
WBC: 4.7 10*3/uL (ref 3.8–10.8)

## 2016-11-06 LAB — URINALYSIS, ROUTINE W REFLEX MICROSCOPIC
BACTERIA UA: NONE SEEN /HPF
Bilirubin Urine: NEGATIVE
GLUCOSE, UA: NEGATIVE
HGB URINE DIPSTICK: NEGATIVE
HYALINE CAST: NONE SEEN /LPF
Ketones, ur: NEGATIVE
Leukocytes, UA: NEGATIVE
Nitrite: NEGATIVE
RBC / HPF: NONE SEEN /HPF (ref 0–2)
SPECIFIC GRAVITY, URINE: 1.017 (ref 1.001–1.03)
pH: 6 (ref 5.0–8.0)

## 2016-11-06 LAB — BASIC METABOLIC PANEL WITH GFR
BUN / CREAT RATIO: 13 (calc) (ref 6–22)
BUN: 18 mg/dL (ref 7–25)
CHLORIDE: 106 mmol/L (ref 98–110)
CO2: 31 mmol/L (ref 20–32)
Calcium: 9.2 mg/dL (ref 8.6–10.3)
Creat: 1.37 mg/dL — ABNORMAL HIGH (ref 0.70–1.11)
GFR, EST AFRICAN AMERICAN: 56 mL/min/{1.73_m2} — AB (ref >=60)
GFR, Est Non African American: 48 mL/min/{1.73_m2} — ABNORMAL LOW (ref >=60)
Glucose, Bld: 96 mg/dL (ref 65–99)
POTASSIUM: 4.1 mmol/L (ref 3.5–5.3)
SODIUM: 141 mmol/L (ref 135–146)

## 2016-11-06 LAB — HEPATIC FUNCTION PANEL
AG Ratio: 2 (calc) (ref 1.0–2.5)
ALBUMIN MSPROF: 3.8 g/dL (ref 3.6–5.1)
ALT: 9 U/L (ref 9–46)
AST: 11 U/L (ref 10–35)
Alkaline phosphatase (APISO): 44 U/L (ref 40–115)
BILIRUBIN DIRECT: 0.1 mg/dL (ref 0.0–0.2)
BILIRUBIN INDIRECT: 0.5 mg/dL (ref 0.2–1.2)
BILIRUBIN TOTAL: 0.6 mg/dL (ref 0.2–1.2)
Globulin: 1.9 g/dL (calc) (ref 1.9–3.7)
Total Protein: 5.7 g/dL — ABNORMAL LOW (ref 6.1–8.1)

## 2016-11-06 LAB — URINE CULTURE
MICRO NUMBER:: 80933864
Result:: NO GROWTH
SPECIMEN QUALITY: ADEQUATE

## 2016-11-06 LAB — TSH: TSH: 2.86 m[IU]/L (ref 0.40–4.50)

## 2016-11-06 LAB — LIPID PANEL
CHOL/HDL RATIO: 4.8 (calc) (ref <5.0)
Cholesterol: 223 mg/dL — ABNORMAL HIGH (ref <200)
HDL: 46 mg/dL (ref >40)
LDL CHOLESTEROL (CALC): 152 mg/dL — AB
NON-HDL CHOLESTEROL (CALC): 177 mg/dL — AB (ref <130)
TRIGLYCERIDES: 124 mg/dL (ref <150)

## 2016-11-06 LAB — MAGNESIUM: MAGNESIUM: 1.8 mg/dL (ref 1.5–2.5)

## 2016-11-10 DIAGNOSIS — M6281 Muscle weakness (generalized): Secondary | ICD-10-CM | POA: Diagnosis not present

## 2016-11-10 DIAGNOSIS — F3341 Major depressive disorder, recurrent, in partial remission: Secondary | ICD-10-CM | POA: Diagnosis not present

## 2016-11-10 DIAGNOSIS — E119 Type 2 diabetes mellitus without complications: Secondary | ICD-10-CM | POA: Diagnosis not present

## 2016-11-10 DIAGNOSIS — M1991 Primary osteoarthritis, unspecified site: Secondary | ICD-10-CM | POA: Diagnosis not present

## 2016-11-10 DIAGNOSIS — N401 Enlarged prostate with lower urinary tract symptoms: Secondary | ICD-10-CM | POA: Diagnosis not present

## 2016-11-10 DIAGNOSIS — K219 Gastro-esophageal reflux disease without esophagitis: Secondary | ICD-10-CM | POA: Diagnosis not present

## 2016-11-10 DIAGNOSIS — E782 Mixed hyperlipidemia: Secondary | ICD-10-CM | POA: Diagnosis not present

## 2016-11-10 DIAGNOSIS — Z7902 Long term (current) use of antithrombotics/antiplatelets: Secondary | ICD-10-CM | POA: Diagnosis not present

## 2016-11-10 DIAGNOSIS — I10 Essential (primary) hypertension: Secondary | ICD-10-CM | POA: Diagnosis not present

## 2016-11-10 DIAGNOSIS — Z7982 Long term (current) use of aspirin: Secondary | ICD-10-CM | POA: Diagnosis not present

## 2016-11-10 DIAGNOSIS — F0390 Unspecified dementia without behavioral disturbance: Secondary | ICD-10-CM | POA: Diagnosis not present

## 2016-11-10 DIAGNOSIS — Z8673 Personal history of transient ischemic attack (TIA), and cerebral infarction without residual deficits: Secondary | ICD-10-CM | POA: Diagnosis not present

## 2016-11-11 ENCOUNTER — Other Ambulatory Visit: Payer: Self-pay | Admitting: Internal Medicine

## 2016-11-11 MED ORDER — LOSARTAN POTASSIUM 100 MG PO TABS: 100.0000 mg | ORAL_TABLET | Freq: Every day | ORAL | 1 refills | Status: DC

## 2016-11-12 ENCOUNTER — Other Ambulatory Visit: Payer: Self-pay | Admitting: *Deleted

## 2016-11-30 ENCOUNTER — Other Ambulatory Visit: Payer: Self-pay | Admitting: Physician Assistant

## 2016-11-30 DIAGNOSIS — N401 Enlarged prostate with lower urinary tract symptoms: Secondary | ICD-10-CM

## 2016-12-01 ENCOUNTER — Other Ambulatory Visit: Payer: Self-pay | Admitting: Physician Assistant

## 2016-12-01 ENCOUNTER — Telehealth: Payer: Self-pay | Admitting: *Deleted

## 2016-12-01 DIAGNOSIS — N401 Enlarged prostate with lower urinary tract symptoms: Secondary | ICD-10-CM

## 2016-12-01 NOTE — Telephone Encounter (Signed)
Spouse called and reported the patient snores very loudly and asked for advise.  Per Dr Melford Aase, he can be referred to Dr Radene Journey, ENT, for an evaluation for nasal obstruction. Also, Dr Melford Aase advised the patient stay off Adderall until his 02/2017 OV.  Spouse is aware.

## 2016-12-25 ENCOUNTER — Other Ambulatory Visit: Payer: Self-pay | Admitting: Internal Medicine

## 2016-12-25 DIAGNOSIS — H52223 Regular astigmatism, bilateral: Secondary | ICD-10-CM | POA: Diagnosis not present

## 2016-12-25 DIAGNOSIS — H35033 Hypertensive retinopathy, bilateral: Secondary | ICD-10-CM | POA: Diagnosis not present

## 2016-12-25 DIAGNOSIS — H5203 Hypermetropia, bilateral: Secondary | ICD-10-CM | POA: Diagnosis not present

## 2016-12-25 DIAGNOSIS — H524 Presbyopia: Secondary | ICD-10-CM | POA: Diagnosis not present

## 2016-12-25 DIAGNOSIS — Z961 Presence of intraocular lens: Secondary | ICD-10-CM | POA: Diagnosis not present

## 2016-12-25 DIAGNOSIS — Z9849 Cataract extraction status, unspecified eye: Secondary | ICD-10-CM | POA: Diagnosis not present

## 2016-12-25 DIAGNOSIS — I10 Essential (primary) hypertension: Secondary | ICD-10-CM | POA: Diagnosis not present

## 2017-01-21 ENCOUNTER — Ambulatory Visit: Payer: Self-pay | Admitting: Internal Medicine

## 2017-02-06 ENCOUNTER — Other Ambulatory Visit: Payer: Self-pay | Admitting: Internal Medicine

## 2017-02-08 DIAGNOSIS — N183 Chronic kidney disease, stage 3 unspecified: Secondary | ICD-10-CM | POA: Insufficient documentation

## 2017-02-08 NOTE — Progress Notes (Deleted)
MEDICARE ANNUAL WELLNESS VISIT AND FOLLOW UP Assessment:   Diagnoses and all orders for this visit:  Encounter for Medicare annual wellness exam  Essential hypertension Continue medications Monitor blood pressure at home; call if consistently over 130/80 Continue DASH diet.   Reminder to go to the ER if any CP, SOB, nausea, dizziness, severe HA, changes vision/speech, left arm numbness and tingling and jaw pain.  Gastroesophageal reflux disease, esophagitis presence not specified Well managed on current plan with PRN OTC agents Discussed diet, avoiding triggers and other lifestyle changes  SDAT (senile dementia of Alzheimer's type) Suspected vascular component per last neuro eval - continue BP treatments and plavix Patient recently relocated for safety reasons with increased agitation - treated with celxa 20 mg ***  Mixed hyperlipidemia No longer treated by statin secondary to age Continue low cholesterol diet and exercise.  Check lipid panel.  -     Lipid panel -     TSH  History of CVA (cerebrovascular accident) Continue tight control of BP Continue plavix  Vitamin D deficiency Continue supplementation -     VITAMIN D 25 Hydroxy (Vit-D Deficiency, Fractures)  Prediabetes Discussed disease and risks Discussed diet/exercise, weight management  -     Hemoglobin A1c  Medication management -     CBC with Differential/Platelet -     BASIC METABOLIC PANEL WITH GFR -     Hepatic function panel  Depression, major, recurrent, in partial remission (HCC)  R/t maladjustment after wife's death and moving to new townhome for declining mobility and safety Continue celexa 20 mg daily  At high risk for falls/imbalance  Continue in home PT  CKD (chronic kidney disease) stage 3, GFR 30-59 ml/min (HCC) -     BASIC METABOLIC PANEL WITH GFR  Benign prostatic hyperplasia with lower urinary tract symptoms Continue terazosin  Over 30 minutes of exam, counseling, chart review, and  critical decision making was performed  Future Appointments  Date Time Provider [place]  02/09/2017  2:45 PM Liane Comber, NP [facility] None  07/22/2017  9:00 AM Unk Pinto, MD [facility] None     Plan:   During the course of the visit the patient was educated and counseled about appropriate screening and preventive services including:    Pneumococcal vaccine   Influenza vaccine  Prevnar 13  Td vaccine  Screening electrocardiogram  Colorectal cancer screening  Diabetes screening  Glaucoma screening  Nutrition counseling    Subjective:  Lance Ramirez is a 82 y.o. male who presents for Medicare Annual Wellness Visit and 3 month follow up for HTN, hyperlipidemia, prediabetes, and vitamin D Def. has Hypertension; Hyperlipidemia; History of CVA (cerebrovascular accident); GERD (gastroesophageal reflux disease); Vitamin D deficiency; Prediabetes; Medication management; Depression, major, recurrent, in partial remission ([place]); At high risk for falls; SDAT (senile dementia of Alzheimer's type); Imbalance; Benign prostatic hyperplasia with lower urinary tract symptoms; and CKD (chronic kidney disease) stage 3, GFR 30-59 ml/min (HCC) on their problem list.  He was referred for in home PT   His blood pressure {HAS HAS NOT:18834} been controlled at home, today their BP is   He {DOES_DOES OIZ:12458} workout. He denies chest pain, shortness of breath, dizziness.   He is not on cholesterol medication secondary to age. His cholesterol is not at goal. The cholesterol last visit was:  Lab Results  Component Value Date   CHOL 223 (H) 11/03/2016   HDL 46 11/03/2016   LDLCALC 169 (H) 01/28/2016   TRIG 124 11/03/2016   CHOLHDL  4.8 11/03/2016   He {Has/has not:18111} been working on diet and exercise for prediabetes, and denies {Symptoms; diabetes w/o none:19199}. Last A1C in the office was:  Lab Results  Component Value Date   HGBA1C 5.9 (H) 06/27/2016   Last  GFR Lab Results  Component Value Date   GFRNONAA 48 (L) 11/03/2016    Patient is on Vitamin D supplement and near goal:    Lab Results  Component Value Date   VD25OH 65 06/27/2016      Medication Review: Current Outpatient Medications on File Prior to Visit  Medication Sig Dispense Refill  . ALPRAZolam (XANAX) 1 MG tablet Take 0.5-1 tablets (0.5-1 mg total) by mouth at bedtime as needed. 90 tablet 0  . amphetamine-dextroamphetamine (ADDERALL) 20 MG tablet Take 1/2 to 1 tablet twice daily as needed for alertness and depression. 60 tablet 0  . aspirin EC 81 MG tablet Take 81 mg by mouth daily.    . bisoprolol-hydrochlorothiazide (ZIAC) 10-6.25 MG tablet TAKE 1 TABLET BY MOUTH EVERY DAY 90 tablet 0  . Cholecalciferol (VITAMIN D-3) 5000 UNITS TABS Take 1 tablet by mouth 2 (two) times daily.    . citalopram (CELEXA) 20 MG tablet TAKE 1 TABLET BY MOUTH EVERY DAY 90 tablet 1  . clopidogrel (PLAVIX) 75 MG tablet Take 1 tablet (75 mg total) by mouth daily. 90 tablet 3  . losartan (COZAAR) 100 MG tablet Take 1 tablet (100 mg total) by mouth daily. 90 tablet 1  . minoxidil (LONITEN) 10 MG tablet TAKE 1/2 TO 1 TABLET DAILY FOR BLOOD PRESSURE AS DIRECTED 90 tablet 0  . terazosin (HYTRIN) 2 MG capsule TAKE 1 CAPSULE (2 MG TOTAL) BY MOUTH AT BEDTIME. 90 capsule 1   No current facility-administered medications on file prior to visit.     Allergies: No Known Allergies  Current Problems (verified) has Hypertension; Hyperlipidemia; History of CVA (cerebrovascular accident); GERD (gastroesophageal reflux disease); Vitamin D deficiency; Prediabetes; Medication management; Depression, major, recurrent, in partial remission ([place]); At high risk for falls; Memory impairment; SDAT (senile dementia of Alzheimer's type); Imbalance; Benign prostatic hyperplasia with lower urinary tract symptoms; and CKD (chronic kidney disease) stage 3, GFR 30-59 ml/min (HCC) on their problem list.  Screening  Tests Immunization History  Administered Date(s) Administered  . DT 11/28/2008  . Influenza, High Dose Seasonal PF 11/03/2016  . Influenza,inj,quad, With Preservative 01/28/2016  . Pneumococcal Conjugate-13 01/28/2016  . Pneumococcal Polysaccharide-23 11/28/2008  . Zoster 09/16/2006   Preventative care: Last colonoscopy: 2009  Prior vaccinations: TD or Tdap: 2010  Influenza: 2017  Pneumococcal: 2010 Prevnar13: 2017 Shingles/Zostavax: 2008  Names of Other Physician/Practitioners you currently use: 1. [facility] here for primary care 2. eye doctor, does not regularly see, last visit 2012 3. 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 Family His family history includes Other in his father; Sudden death in his mother. Social history  He reports that he quit smoking about 38 years ago. His smoking use included cigarettes. He has a 30.00 pack-year smoking history. He quit smokeless tobacco use about 38 years ago. His smokeless tobacco  use included chew. He reports that he does not drink alcohol or use drugs.  MEDICARE WELLNESS OBJECTIVES: Physical activity:   Cardiac risk factors:   Depression/mood screen:   Depression screen [facility] 2/9 06/27/2016  Decreased Interest 0  Down, Depressed, Hopeless 0  PHQ - 2 Score 0    ADLs:  In your present state of health, do you have any difficulty performing the following activities: 07/14/2016 06/27/2016  Hearing? N N  Vision? N N   Difficulty concentrating or making decisions? Y Y  Comment - Dementia  Walking or climbing stairs? N N  Dressing or bathing? N N  Doing errands, shopping? - Y  Comment - requires superviasion due to his Dementia  Some recent data might be hidden     Cognitive Testing  Alert? Yes  Normal Appearance?Yes  Oriented to person? Yes  Place? Yes   Time? Yes  Recall of three objects?  Yes  Can perform simple calculations? Yes  Displays appropriate judgment?Yes  Can read the correct time from a watch face?Yes  EOL planning:     Objective:   There were no vitals filed for this visit. There is no height or weight on file to calculate BMI.  General appearance: alert, no distress, WD/WN, male HEENT: normocephalic, sclerae anicteric, TMs pearly, nares patent, no discharge or erythema, pharynx normal Oral cavity: MMM, no lesions Neck: supple, no lymphadenopathy, no thyromegaly, no masses Heart: RRR, normal S1, S2, no murmurs Lungs: CTA bilaterally, no wheezes, rhonchi, or rales Abdomen: +bs, soft, non tender, non distended, no masses, no hepatomegaly, no splenomegaly Musculoskeletal: nontender, no swelling, no obvious deformity Extremities: no edema, no cyanosis, no clubbing Pulses: 2+ symmetric, upper and lower extremities, normal cap refill Neurological: alert, oriented x 3, CN2-12 intact, strength normal upper extremities and lower extremities, sensation normal throughout, DTRs 2+ throughout, no cerebellar signs, gait normal Psychiatric: normal affect, behavior normal, pleasant   Medicare Attestation I have personally reviewed: The patient's medical and social history Their use of alcohol, tobacco or illicit drugs Their current medications and supplements The patient's functional ability including ADLs,fall risks, home safety risks, cognitive, and hearing and visual impairment Diet and physical activities Evidence for depression or mood disorders  The patient's weight, height,  BMI, and visual acuity have been recorded in the chart.  I have made referrals, counseling, and provided education to the patient based on review of the above and I have provided the patient with a written personalized care plan for preventive services.     Izora Ribas, NP   02/08/2017

## 2017-02-09 ENCOUNTER — Ambulatory Visit: Payer: Self-pay | Admitting: Adult Health

## 2017-02-09 NOTE — Progress Notes (Signed)
MEDICARE ANNUAL WELLNESS VISIT AND FOLLOW UP Assessment:   Diagnoses and all orders for this visit:  Encounter for Medicare annual wellness exam  Essential hypertension Continue medications Monitor blood pressure at home; call if consistently over 130/80 Continue DASH diet.   Reminder to go to the ER if any CP, SOB, nausea, dizziness, severe HA, changes vision/speech, left arm numbness and tingling and jaw pain.  Gastroesophageal reflux disease, esophagitis presence not specified Well managed on current plan with PRN OTC agents Discussed diet, avoiding triggers and other lifestyle changes  SDAT (senile dementia of Alzheimer's type) Suspected vascular component per last neuro eval - continue BP treatments and plavix Patient recently relocated for safety reasons with increased agitation  Reporting poor motivation, depression- will increase celexa from 20 to 40 mg  Mixed hyperlipidemia No longer treated by statin secondary to age Continue low cholesterol diet and exercise.  Check lipid panel.  -     Lipid panel -     TSH  History of CVA (cerebrovascular accident) Continue tight control of BP Continue plavix  Vitamin D deficiency Continue supplementation -     VITAMIN D 25 Hydroxy (Vit-D Deficiency, Fractures)  Prediabetes Discussed disease and risks Discussed diet/exercise, weight management  -     Hemoglobin A1c  Medication management -     CBC with Differential/Platelet -     BASIC METABOLIC PANEL WITH GFR -     Hepatic function panel  Depression, major, recurrent, in partial remission (HCC)  R/t maladjustment after wife's death and moving to new townhome for declining mobility and safety. Will increase celexa to 40 mg daily for continued depressive symptoms  At high risk for falls/imbalance PT did follow him at home; evaluated for falls risk, concluded no need for continued therapy  CKD (chronic kidney disease) stage 3, GFR 30-59 ml/min (HCC) Increase fluids,  avoid NSAIDS, monitor sugars, will monitor -     BASIC METABOLIC PANEL WITH GFR  Benign prostatic hyperplasia with lower urinary tract symptoms Continue terazosin, starting finasteride today, refer to urology as needed  Over 30 minutes of exam, counseling, chart review, and critical decision making was performed  Future Appointments  Date Time Provider [place]  07/22/2017  9:00 AM Unk Pinto, MD [facility] None    Plan:   During the course of the visit the patient was educated and counseled about appropriate screening and preventive services including:    Pneumococcal vaccine   Influenza vaccine  Prevnar 13  Td vaccine  Screening electrocardiogram  Colorectal cancer screening  Diabetes screening  Glaucoma screening  Nutrition counseling    Subjective:  Lance Ramirez is a 83 y.o. male who presents accompanied by his daughter for Medicare Annual Wellness Visit and 3 month follow up for HTN, hyperlipidemia, prediabetes, and vitamin D Def. has Hypertension; Hyperlipidemia; History of CVA (cerebrovascular accident); GERD (gastroesophageal reflux disease); Vitamin D deficiency; Prediabetes; Medication management; Depression, major, recurrent, in partial remission ([place]); At high risk for falls; SDAT (senile dementia of Alzheimer's type); Imbalance; Benign prostatic hyperplasia with lower urinary tract symptoms; and CKD (chronic kidney disease) stage 3, GFR 30-59 ml/min (HCC) on their problem list. He was referred for in home PT at the last visit; when inquired how this was going neither patient nor daughter was aware- fill follow up. The patient's daughter shares concerns about patient's lifestyle and poor schedule.   His blood pressure has been controlled at home, today their BP is BP: 124/62 He does workout. He denies chest pain,  shortness of breath, dizziness.   He is not on cholesterol medication secondary to age. His cholesterol is not at goal. The cholesterol  last visit was:  Lab Results  Component Value Date   CHOL 223 (H) 11/03/2016   HDL 46 11/03/2016   LDLCALC 169 (H) 01/28/2016   TRIG 124 11/03/2016   CHOLHDL 4.8 11/03/2016   He has been working on diet and exercise for prediabetes, and denies nausea, paresthesia of the feet, polydipsia, polyuria, visual disturbances and vomiting. Last A1C in the office was:  Lab Results  Component Value Date   HGBA1C 5.9 (H) 06/27/2016   Last GFR Lab Results  Component Value Date   GFRNONAA 48 (L) 11/03/2016    Patient is on Vitamin D supplement and near goal:    Lab Results  Component Value Date   VD25OH 65 06/27/2016      Medication Review: Current Outpatient Medications on File Prior to Visit  Medication Sig Dispense Refill  . ALPRAZolam (XANAX) 1 MG tablet Take 0.5-1 tablets (0.5-1 mg total) by mouth at bedtime as needed. 90 tablet 0  . amphetamine-dextroamphetamine (ADDERALL) 20 MG tablet Take 1/2 to 1 tablet twice daily as needed for alertness and depression. 60 tablet 0  . aspirin EC 81 MG tablet Take 81 mg by mouth daily.    . bisoprolol-hydrochlorothiazide (ZIAC) 10-6.25 MG tablet TAKE 1 TABLET BY MOUTH EVERY DAY 90 tablet 0  . Cholecalciferol (VITAMIN D-3) 5000 UNITS TABS Take 1 tablet by mouth 2 (two) times daily.    . citalopram (CELEXA) 20 MG tablet TAKE 1 TABLET BY MOUTH EVERY DAY 90 tablet 1  . clopidogrel (PLAVIX) 75 MG tablet Take 1 tablet (75 mg total) by mouth daily. 90 tablet 3  . losartan (COZAAR) 100 MG tablet Take 1 tablet (100 mg total) by mouth daily. 90 tablet 1  . minoxidil (LONITEN) 10 MG tablet TAKE 1/2 TO 1 TABLET DAILY FOR BLOOD PRESSURE AS DIRECTED 90 tablet 0  . terazosin (HYTRIN) 2 MG capsule TAKE 1 CAPSULE (2 MG TOTAL) BY MOUTH AT BEDTIME. 90 capsule 1   No current facility-administered medications on file prior to visit.     Allergies: No Known Allergies  Current Problems (verified) has Hypertension; Hyperlipidemia; History of CVA (cerebrovascular  accident); GERD (gastroesophageal reflux disease); Vitamin D deficiency; Prediabetes; Medication management; Depression, major, recurrent, in partial remission ([place]); At high risk for falls; SDAT (senile dementia of Alzheimer's type); Imbalance; Benign prostatic hyperplasia with lower urinary tract symptoms; and CKD (chronic kidney disease) stage 3, GFR 30-59 ml/min (HCC) on their problem list.  Screening Tests Immunization History  Administered Date(s) Administered  . DT 11/28/2008  . Influenza, High Dose Seasonal PF 11/03/2016  . Influenza,inj,quad, With Preservative 01/28/2016  . Pneumococcal Conjugate-13 01/28/2016  . Pneumococcal Polysaccharide-23 11/28/2008  . Zoster 09/16/2006   Preventative care: Last colonoscopy: 2009  Prior vaccinations: TD or Tdap: 2010  Influenza: 2018  Pneumococcal: 2010 Prevnar13: 2017 Shingles/Zostavax: 2008  Names of Other Physician/Practitioners you currently use: 1. [facility] here for primary care 2. eye doctor, does not regularly see, last visit 2012 3. 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 Family His family history includes Other in his father; Sudden death in his mother. Social history  He reports that he quit smoking about 38 years ago. His smoking use included cigarettes. He has a 30.00 pack-year smoking history. He quit smokeless tobacco use about 38 years ago. His smokeless tobacco use included chew. He reports that he  does not drink alcohol or use drugs.  MEDICARE WELLNESS OBJECTIVES: Physical activity: Current Exercise Habits: Home exercise routine, Type of exercise: strength training/weights, Time (Minutes): 10, Frequency (Times/Week): 7, Weekly Exercise (Minutes/Week): 70, Intensity: Mild Cardiac risk factors: Cardiac Risk Factors include: hypertension;dyslipidemia;diabetes mellitus;advanced age (>45men, >62 women);sedentary lifestyle;male gender;smoking/ tobacco exposure Depression/mood screen:   Depression screen [facility] 2/9 02/10/2017  Decreased Interest 2  Down, Depressed, Hopeless 2  PHQ - 2 Score 4  Altered sleeping 2  Tired, decreased energy 2  Change in appetite 0  Feeling bad or failure about yourself  1  Trouble concentrating 3  Moving slowly or fidgety/restless 2  Suicidal thoughts 1  PHQ-9 Score 15  Difficult doing work/chores Very difficult    ADLs:  In your present state of health, do you have any difficulty performing the following activities: 02/10/2017 07/14/2016  Hearing? N N  Vision? N N  Difficulty concentrating or making decisions? Y Y  Comment post CVA -  Walking or climbing stairs? N N  Dressing or bathing? N N  Doing errands, shopping? Y -  Comment Driven by daughter -  Some recent data might be hidden     Cognitive Testing  Alert? Yes  Normal Appearance?Yes  Oriented to person? Yes  Place? Yes   Time? Yes  Recall of three objects?  No  Can perform simple calculations? No  Displays appropriate judgment?Yes  Can read the correct time from a watch face?Yes  EOL planning: Does Patient Have a Medical Advance Directive?: Yes Type of Advance Directive: Healthcare Power of Attorney, Living will Does patient want to make changes to medical advance directive?: No - Patient declined Copy of [place] in Chart?: Yes   Objective:   Today's Vitals   02/10/17 1406  BP: 124/62  Pulse: 72  Temp: 97.7 F (36.5 C)  SpO2: 97%  Weight: 198 lb (89.8 kg)   Height: 5\' 10"  (1.778 m)   Body mass index is 28.41 kg/m.  General appearance: alert, no distress, WD/WN, male HEENT: normocephalic, sclerae anicteric, TMs pearly, nares patent, no discharge or erythema, pharynx normal Oral cavity: MMM, no lesions Neck: supple, no lymphadenopathy, no thyromegaly, no masses Heart: RRR, normal S1, S2, no murmurs Lungs: CTA bilaterally, no wheezes, rhonchi, or rales Abdomen: +bs, soft, non tender, non distended, no masses, no hepatomegaly, no splenomegaly Musculoskeletal: nontender, no swelling, no obvious deformity Extremities: no edema, no cyanosis, no clubbing Pulses: 2+ symmetric, upper and lower extremities, normal cap refill Neurological: alert, oriented x 3, CN2-12 intact, strength normal upper extremities and lower extremities, sensation normal throughout, DTRs 2+ throughout, no cerebellar signs, gait normal Psychiatric: normal affect, behavior normal, pleasant   Medicare Attestation I have personally reviewed: The patient's medical and social history Their use of alcohol, tobacco or illicit drugs Their current medications and supplements The patient's functional ability including ADLs,fall risks, home safety risks, cognitive, and hearing and visual impairment Diet and physical activities Evidence for depression or mood disorders  The patient's weight, height, BMI, and visual acuity have been recorded in the chart.  I have  made referrals, counseling, and provided education to the patient based on review of the above and I have provided the patient with a written personalized care plan for preventive services.     Izora Ribas, NP   02/10/2017

## 2017-02-10 ENCOUNTER — Encounter: Payer: Self-pay | Admitting: Adult Health

## 2017-02-10 ENCOUNTER — Ambulatory Visit: Payer: PPO | Admitting: Adult Health

## 2017-02-10 VITALS — BP 124/62 | HR 72 | Temp 97.7°F | Ht 70.0 in | Wt 198.0 lb

## 2017-02-10 DIAGNOSIS — J40 Bronchitis, not specified as acute or chronic: Secondary | ICD-10-CM

## 2017-02-10 DIAGNOSIS — N183 Chronic kidney disease, stage 3 unspecified: Secondary | ICD-10-CM

## 2017-02-10 DIAGNOSIS — Z8673 Personal history of transient ischemic attack (TIA), and cerebral infarction without residual deficits: Secondary | ICD-10-CM

## 2017-02-10 DIAGNOSIS — G301 Alzheimer's disease with late onset: Secondary | ICD-10-CM

## 2017-02-10 DIAGNOSIS — N401 Enlarged prostate with lower urinary tract symptoms: Secondary | ICD-10-CM

## 2017-02-10 DIAGNOSIS — I10 Essential (primary) hypertension: Secondary | ICD-10-CM

## 2017-02-10 DIAGNOSIS — E559 Vitamin D deficiency, unspecified: Secondary | ICD-10-CM

## 2017-02-10 DIAGNOSIS — F3341 Major depressive disorder, recurrent, in partial remission: Secondary | ICD-10-CM

## 2017-02-10 DIAGNOSIS — Z79899 Other long term (current) drug therapy: Secondary | ICD-10-CM

## 2017-02-10 DIAGNOSIS — E782 Mixed hyperlipidemia: Secondary | ICD-10-CM | POA: Diagnosis not present

## 2017-02-10 DIAGNOSIS — Z0001 Encounter for general adult medical examination with abnormal findings: Secondary | ICD-10-CM

## 2017-02-10 DIAGNOSIS — R3914 Feeling of incomplete bladder emptying: Secondary | ICD-10-CM

## 2017-02-10 DIAGNOSIS — R7303 Prediabetes: Secondary | ICD-10-CM

## 2017-02-10 DIAGNOSIS — K219 Gastro-esophageal reflux disease without esophagitis: Secondary | ICD-10-CM | POA: Diagnosis not present

## 2017-02-10 DIAGNOSIS — R6889 Other general symptoms and signs: Secondary | ICD-10-CM | POA: Diagnosis not present

## 2017-02-10 DIAGNOSIS — Z9181 History of falling: Secondary | ICD-10-CM

## 2017-02-10 DIAGNOSIS — F0280 Dementia in other diseases classified elsewhere without behavioral disturbance: Secondary | ICD-10-CM

## 2017-02-10 DIAGNOSIS — Z0000 Encounter for general adult medical examination without abnormal findings: Secondary | ICD-10-CM

## 2017-02-10 MED ORDER — AZITHROMYCIN 250 MG PO TABS: ORAL_TABLET | ORAL | 1 refills | Status: AC

## 2017-02-10 MED ORDER — FINASTERIDE 5 MG PO TABS: 5.0000 mg | ORAL_TABLET | Freq: Every day | ORAL | 2 refills | Status: DC

## 2017-02-10 MED ORDER — CITALOPRAM HYDROBROMIDE 40 MG PO TABS: 40.0000 mg | ORAL_TABLET | Freq: Every day | ORAL | 1 refills | Status: DC

## 2017-02-10 NOTE — Patient Instructions (Addendum)
Water : 8 glasses of water daily  Start walking every day: work up to 30 min   Sit down with daughter to make a schedule to follow every day -   Start on daily allergy medicine  Here is some information to help you keep your heart healthy: Move it! - Aim for 30 mins of activity every day. Take it slowly at first. Talk to [place] before starting any new exercise program.   Lose it.  -Body Mass Index (BMI) can indicate if you need to lose weight. A healthy range is 18.5-24.9. For a BMI calculator, go to [employer]  Waist Management -Excess abdominal fat is a risk factor for heart disease, diabetes, asthma, stroke and more. Ideal waist circumference is less than 35" for women and less than 40" for men.   Eat Right -focus on fruits, vegetables, whole grains, and meals you make yourself. Avoid foods with trans fat and high sugar/sodium content.   Snooze or Snore? - Loud snoring can be a sign of sleep apnea, a significant risk factor for high blood pressure, heart attach, stroke, and heart arrhythmias.  Kick the habit -Quit Smoking! Avoid second hand smoke. A single cigarette raises your blood pressure for 20 mins and increases the risk of heart attack and stroke for the next 24 hours.   Are Aspirin and Supplements right for you? -Add ENTERIC COATED low dose 81 mg Aspirin daily OR can do every other day if you have easy bruising to protect your heart and head. As well as to reduce risk of Colon Cancer by 20 %, Skin Cancer by 26 % , Melanoma by 46% and Pancreatic cancer by 60%  Say "No to Stress -There may be little you can do about problems that cause stress. However, techniques such as long walks, meditation, and exercise can help you manage it.   Start Now! - Make changes one at a time and set reasonable goals to increase your likelihood of success.

## 2017-02-11 LAB — CBC WITH DIFFERENTIAL/PLATELET
BASOS PCT: 1.1 %
Basophils Absolute: 68 cells/uL (ref 0–200)
Eosinophils Absolute: 112 cells/uL (ref 15–500)
Eosinophils Relative: 1.8 %
HCT: 43.7 % (ref 38.5–50.0)
HEMOGLOBIN: 14.6 g/dL (ref 13.2–17.1)
Lymphs Abs: 1438 cells/uL (ref 850–3900)
MCH: 28.2 pg (ref 27.0–33.0)
MCHC: 33.4 g/dL (ref 32.0–36.0)
MCV: 84.4 fL (ref 80.0–100.0)
MONOS PCT: 7.5 %
MPV: 11.7 fL (ref 7.5–12.5)
NEUTROS ABS: 4117 {cells}/uL (ref 1500–7800)
Neutrophils Relative %: 66.4 %
PLATELETS: 206 10*3/uL (ref 140–400)
RBC: 5.18 10*6/uL (ref 4.20–5.80)
RDW: 14.1 % (ref 11.0–15.0)
TOTAL LYMPHOCYTE: 23.2 %
WBC: 6.2 10*3/uL (ref 3.8–10.8)
WBCMIX: 465 {cells}/uL (ref 200–950)

## 2017-02-11 LAB — BASIC METABOLIC PANEL WITH GFR
BUN/Creatinine Ratio: 14 (calc) (ref 6–22)
BUN: 23 mg/dL (ref 7–25)
CALCIUM: 9.7 mg/dL (ref 8.6–10.3)
CO2: 32 mmol/L (ref 20–32)
CREATININE: 1.64 mg/dL — AB (ref 0.70–1.11)
Chloride: 105 mmol/L (ref 98–110)
GFR, Est African American: 45 mL/min/{1.73_m2} — ABNORMAL LOW (ref >=60)
GFR, Est Non African American: 39 mL/min/{1.73_m2} — ABNORMAL LOW (ref >=60)
GLUCOSE: 117 mg/dL — AB (ref 65–99)
Potassium: 4.5 mmol/L (ref 3.5–5.3)
Sodium: 142 mmol/L (ref 135–146)

## 2017-02-11 LAB — HEPATIC FUNCTION PANEL
AG Ratio: 1.7 (calc) (ref 1.0–2.5)
ALBUMIN MSPROF: 3.8 g/dL (ref 3.6–5.1)
ALT: 8 U/L — AB (ref 9–46)
AST: 10 U/L (ref 10–35)
Alkaline phosphatase (APISO): 45 U/L (ref 40–115)
BILIRUBIN INDIRECT: 0.5 mg/dL (ref 0.2–1.2)
Bilirubin, Direct: 0.1 mg/dL (ref 0.0–0.2)
GLOBULIN: 2.3 g/dL (ref 1.9–3.7)
TOTAL PROTEIN: 6.1 g/dL (ref 6.1–8.1)
Total Bilirubin: 0.6 mg/dL (ref 0.2–1.2)

## 2017-02-11 LAB — LIPID PANEL
CHOLESTEROL: 243 mg/dL — AB (ref <200)
HDL: 40 mg/dL — AB (ref >40)
LDL CHOLESTEROL (CALC): 178 mg/dL — AB
Non-HDL Cholesterol (Calc): 203 mg/dL (calc) — ABNORMAL HIGH (ref <130)
TRIGLYCERIDES: 120 mg/dL (ref <150)
Total CHOL/HDL Ratio: 6.1 (calc) — ABNORMAL HIGH (ref <5.0)

## 2017-02-11 LAB — VITAMIN D 25 HYDROXY (VIT D DEFICIENCY, FRACTURES): VIT D 25 HYDROXY: 86 ng/mL (ref 30–100)

## 2017-02-11 LAB — TSH: TSH: 2.64 m[IU]/L (ref 0.40–4.50)

## 2017-03-19 ENCOUNTER — Other Ambulatory Visit: Payer: Self-pay | Admitting: *Deleted

## 2017-03-19 DIAGNOSIS — I10 Essential (primary) hypertension: Secondary | ICD-10-CM

## 2017-03-19 MED ORDER — MINOXIDIL 10 MG PO TABS: ORAL_TABLET | ORAL | 0 refills | Status: DC

## 2017-03-28 ENCOUNTER — Other Ambulatory Visit: Payer: Self-pay | Admitting: Internal Medicine

## 2017-05-04 ENCOUNTER — Other Ambulatory Visit: Payer: Self-pay | Admitting: Internal Medicine

## 2017-05-19 NOTE — Progress Notes (Deleted)
MEDICARE ANNUAL WELLNESS VISIT AND FOLLOW UP Assessment:   Diagnoses and all orders for this visit:  Encounter for Medicare annual wellness exam  Essential hypertension Continue medications Monitor blood pressure at home; call if consistently over 130/80 Continue DASH diet.   Reminder to go to the ER if any CP, SOB, nausea, dizziness, severe HA, changes vision/speech, left arm numbness and tingling and jaw pain.  Gastroesophageal reflux disease, esophagitis presence not specified Well managed on current plan with PRN OTC agents Discussed diet, avoiding triggers and other lifestyle changes  SDAT (senile dementia of Alzheimer's type) Suspected vascular component per last neuro eval - continue BP treatments and plavix, no longer on namenda Patient recently relocated for safety reasons with increased agitation  Poor motivation/energy - on celexa 40 mg, has been prescribed low dose adderall for energy by other provider  Benign prostatic hyperplasia with incomplete bladder emptying Symptoms stable on medications; continue to monitor Urology referral as needed  CKD (chronic kidney disease) stage 3, GFR 30-59 ml/min (HCC) Increase fluids, avoid NSAIDS, monitor sugars, will monitor -     BASIC METABOLIC PANEL WITH GFR  At high risk for falls Falls risk assessment completed, home safety discussed, cane/walker recommended - pt resistant PT did follow him at home; evaluated for falls risk, concluded no need for continued therapy  Depression, major, recurrent, in partial remission ([place]) Continue medications  Lifestyle discussed: diet/exerise, sleep hygiene, stress management, hydration  History of CVA (cerebrovascular accident) Control blood pressure, cholesterol, glucose, increase exercise.   Mixed hyperlipidemia Continue low cholesterol diet and exercise.  Defer chol panel to every other visit as would no longer treat -     TSH  Imbalance Falls screening performed; discussed home  safety, patient has refused cane in the past, assistance discussed today PT did follow him at home; evaluated for falls risk, concluded no need for continued therapy  Medication management -     CBC with Differential/Platelet -     BASIC METABOLIC PANEL WITH GFR -     Hepatic function panel  Prediabetes Discussed disease and risks Discussed diet/exercise, weight management  -     Hemoglobin A1c  Vitamin D deficiency At goal at recent check; continue to recommend supplementation for goal of 70-100 Defer vitamin D level   Over 30 minutes of exam, counseling, chart review, and critical decision making was performed  Future Appointments  Date Time Provider [place]  05/20/2017  3:30 PM Liane Comber, NP [facility] None  08/31/2017  3:00 PM Unk Pinto, MD [facility] None     Plan:   During the course of the visit the patient was educated and counseled about appropriate screening and preventive services including:    Pneumococcal vaccine   Influenza vaccine  Prevnar 13  Td vaccine  Screening electrocardiogram  Colorectal cancer screening  Diabetes screening  Glaucoma screening  Nutrition counseling    Subjective:  Lance Ramirez is a 84 y.o. male who presents for Medicare Annual Wellness Visit and 3 month follow up for HTN, hyperlipidemia, prediabetes, and vitamin D Def. Patient has ASCVDs/p CVA in 2012 rescued by TPA and a 2sd CVA in 2014 . Patient has mild/moderate Dementia felt likely vascular and since last year has been moved with his wife to an independent apartment by their children with some agitation noted post-transition.   celexa increased last time ***  he has a diagnosis of depression and is currently on celexa 40 mg daily, reports symptoms are*** well controlled on current regimen.  he   BMI is There is no height or weight on file to calculate BMI., he {HAS HAS HKV:42595} been working on diet and exercise. Wt Readings from Last 3  Encounters:  02/10/17 198 lb (89.8 kg)  11/03/16 199 lb (90.3 kg)  06/27/16 202 lb 9.6 oz (91.9 kg)   His blood pressure {HAS HAS NOT:18834} been controlled at home, today their BP is   He {DOES_DOES GLO:75643} workout. He denies chest pain, shortness of breath, dizziness.   He is not on cholesterol medication secondary to hx of myalgias with statins and age considerations. His cholesterol is not at goal. The cholesterol last visit was:   Lab Results  Component Value Date   CHOL 243 (H) 02/10/2017   HDL 40 (L) 02/10/2017   LDLCALC 169 (H) 01/28/2016   TRIG 120 02/10/2017   CHOLHDL 6.1 (H) 02/10/2017   He {Has/has not:18111} been working on diet and exercise for prediabetes, and denies {Symptoms; diabetes w/o none:19199}. Last A1C in the office was:  Lab Results  Component Value Date   HGBA1C 5.9 (H) 06/27/2016   Last GFR Lab Results  Component Value Date   GFRNONAA 39 (L) 02/10/2017    Patient is on Vitamin D supplement and at goal at recent check:    Lab Results  Component Value Date   VD25OH 86 02/10/2017      Medication Review: Current Outpatient Medications on File Prior to Visit  Medication Sig Dispense Refill  . ALPRAZolam (XANAX) 1 MG tablet Take 0.5-1 tablets (0.5-1 mg total) by mouth at bedtime as needed. 90 tablet 0  . amphetamine-dextroamphetamine (ADDERALL) 20 MG tablet Take 1/2 to 1 tablet twice daily as needed for alertness and depression. 60 tablet 0  . aspirin EC 81 MG tablet Take 81 mg by mouth daily.    . bisoprolol-hydrochlorothiazide (ZIAC) 10-6.25 MG tablet TAKE 1 TABLET BY MOUTH EVERY DAY 90 tablet 0  . Cholecalciferol (VITAMIN D-3) 5000 UNITS TABS Take 1 tablet by mouth 2 (two) times daily.    . citalopram (CELEXA) 40 MG tablet Take 1 tablet (40 mg total) by mouth daily. 90 tablet 1  . clopidogrel (PLAVIX) 75 MG tablet Take 1 tablet (75 mg total) by mouth daily. 90 tablet 3  . finasteride (PROSCAR) 5 MG tablet Take 1 tablet (5 mg total) by mouth  daily. 90 tablet 2  . losartan (COZAAR) 100 MG tablet TAKE 1 TABLET BY MOUTH EVERY DAY 90 tablet 0  . minoxidil (LONITEN) 10 MG tablet TAKE 1/2 TO 1 TABLET DAILY FOR BLOOD PRESSURE AS DIRECTED 90 tablet 0  . terazosin (HYTRIN) 2 MG capsule TAKE 1 CAPSULE (2 MG TOTAL) BY MOUTH AT BEDTIME. 90 capsule 1   No current facility-administered medications on file prior to visit.     Allergies: No Known Allergies  Current Problems (verified) has Hypertension; Hyperlipidemia; History of CVA (cerebrovascular accident); GERD (gastroesophageal reflux disease); Vitamin D deficiency; Prediabetes; Medication management; Depression, major, recurrent, in partial remission ([place]); At high risk for falls; SDAT (senile dementia of Alzheimer's type); Imbalance; Benign prostatic hyperplasia with lower urinary tract symptoms; CKD (chronic kidney disease) stage 3, GFR 30-59 ml/min (HCC); and Encounter for Medicare annual wellness exam on their problem list.  Screening Tests Immunization History  Administered Date(s) Administered  . DT 11/28/2008  . Influenza, High Dose Seasonal PF 11/03/2016  . Influenza,inj,quad, With Preservative 01/28/2016  . Pneumococcal Conjugate-13 01/28/2016  . Pneumococcal Polysaccharide-23 11/28/2008  . Zoster 09/16/2006    Preventative care: Last  colonoscopy: 2009 DONE d/t age  Prior vaccinations: TD or Tdap: 2010  Influenza: 2018  Pneumococcal: 2010 Prevnar13: 2017 Shingles/Zostavax: 2008  Names of Other Physician/Practitioners you currently use: 1. [facility] here for primary care 2. Does not regularly see , eye doctor, last visit 2012 3. 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 Family His family history includes Other in his father; Sudden death in his mother. Social history  He reports that he quit smoking about 39 years ago. His smoking use included cigarettes. He has a 30.00 pack-year smoking history. He quit smokeless tobacco use about 39 years ago. His smokeless tobacco use included chew. He reports that he does not drink alcohol or use drugs.  MEDICARE WELLNESS OBJECTIVES: Physical activity:   Cardiac risk factors:   Depression/mood screen:   Depression screen [facility] 2/9 02/10/2017  Decreased Interest 2  Down, Depressed, Hopeless 2  PHQ - 2 Score 4  Altered sleeping 2  Tired, decreased energy 2  Change in appetite 0  Feeling bad or failure about yourself  1  Trouble concentrating 3  Moving slowly or fidgety/restless 2  Suicidal thoughts 1  PHQ-9 Score 15  Difficult doing work/chores Very difficult    ADLs:  In your present state of health, do you have any difficulty performing the following activities: 02/10/2017 07/14/2016  Hearing? N N  Vision? N N  Difficulty concentrating or making decisions? Y Y  Comment post CVA -  Walking or climbing stairs? N N  Dressing or bathing? N N  Doing errands, shopping? Y -  Comment Driven by daughter -  Some recent data might be hidden     Cognitive Testing  Alert? Yes  Normal Appearance?Yes  Oriented to person? Yes  Place? Yes   Time? Yes  Recall of three objects?  Yes  Can perform simple calculations? Yes  Displays appropriate judgment?Yes  Can read the correct time from a watch face?Yes  EOL planning:     Objective:   There were no vitals filed for this visit. There is no height or weight on file to calculate BMI.  General appearance: alert, no distress, WD/WN, male HEENT:  normocephalic, sclerae anicteric, TMs pearly, nares patent, no discharge or erythema, pharynx normal Oral cavity: MMM, no lesions Neck: supple, no lymphadenopathy, no thyromegaly, no masses Heart: RRR, normal S1, S2, no murmurs Lungs: CTA bilaterally, no wheezes, rhonchi, or rales Abdomen: +bs, soft, non tender, non distended, no masses, no hepatomegaly, no splenomegaly Musculoskeletal: nontender, no swelling, no obvious deformity Extremities: no edema, no cyanosis, no clubbing Pulses: 2+ symmetric, upper and lower extremities, normal cap refill Neurological: alert, oriented x 3, CN2-12 intact, strength normal upper extremities and lower extremities, sensation normal throughout, DTRs 2+ throughout, no cerebellar signs, gait normal Psychiatric: normal affect, behavior normal, pleasant   Medicare Attestation I have personally reviewed: The patient's medical and social history Their use of alcohol, tobacco or illicit drugs Their current medications and supplements The patient's functional ability including ADLs,fall risks, home safety risks, cognitive, and hearing and visual impairment Diet and physical activities Evidence for depression or  mood disorders  The patient's weight, height, BMI, and visual acuity have been recorded in the chart.  I have made referrals, counseling, and provided education to the patient based on review of the above and I have provided the patient with a written personalized care plan for preventive services.     Izora Ribas, NP   05/19/2017

## 2017-05-20 ENCOUNTER — Ambulatory Visit: Payer: Self-pay | Admitting: Adult Health

## 2017-06-02 NOTE — Progress Notes (Deleted)
FOLLOW UP  Assessment and Plan:   Hypertension Well controlled with current medications  Monitor blood pressure at home; patient to call if consistently greater than 130/80 Continue DASH diet.   Reminder to go to the ER if any CP, SOB, nausea, dizziness, severe HA, changes vision/speech, left arm numbness and tingling and jaw pain.  Cholesterol Currently above goal; no longer on treatment secondary to age and progressive dementia Continue low cholesterol diet and exercise.  Defer lipid panel   Prediabetes Continue diet and exercise.  Perform daily foot/skin check, notify office of any concerning changes.  Check A1C  Obesity with co morbidities Long discussion about weight loss, diet, and exercise Recommended diet heavy in fruits and veggies and low in animal meats, cheeses, and dairy products, appropriate calorie intake Discussed ideal weight for height (below ***) and initial weight goal (***) Patient will work on *** Will follow up in 3 months  Vitamin D Def At goal at last visit; continue supplementation to maintain goal of 70-100 Defer Vit D level  Depression  Continue diet and meds as discussed. Further disposition pending results of labs. Discussed med's effects and SE's.   Over 30 minutes of exam, counseling, chart review, and critical decision making was performed.   Future Appointments  Date Time Provider [place]  06/03/2017  3:00 PM Liane Comber, NP [facility] None  08/31/2017  3:00 PM Unk Pinto, MD [facility] None    ----------------------------------------------------------------------------------------------------------------------  HPI 84 y.o. male  presents for 3 month follow up on hypertension, cholesterol, prediabetes, weight and vitamin D deficiency. He has hx of SDAT with hx of CVA/ suspected vascular component for which he continues on plavix. His family has shared ongoing concerns with lack of motivation/energy at home; celexa was  increased from 20 mg to 40 mg at last visit; he is also prescribed xanax 0.5-1 mg to be used as needed at night for anxiety/sleep and has been prescribed adderall 20 mg by Dr. Melford Aase to be used as needed for energy and depression. ***  Urinary symptoms?  BMI is There is no height or weight on file to calculate BMI., he {HAS HAS BOF:75102} been working on diet and exercise. Wt Readings from Last 3 Encounters:  02/10/17 198 lb (89.8 kg)  11/03/16 199 lb (90.3 kg)  06/27/16 202 lb 9.6 oz (91.9 kg)   His blood pressure {HAS HAS NOT:18834} been controlled at home, today their BP is    He {DOES_DOES HEN:27782} workout. He denies chest pain, shortness of breath, dizziness.   He is not on cholesterol medication secondary to age and advancing dementia and denies myalgias. His cholesterol is not at goal. The cholesterol last visit was:   Lab Results  Component Value Date   CHOL 243 (H) 02/10/2017   HDL 40 (L) 02/10/2017   LDLCALC 178 (H) 02/10/2017   TRIG 120 02/10/2017   CHOLHDL 6.1 (H) 02/10/2017    He {Has/has not:18111} been working on diet and exercise for prediabetes, and denies {Symptoms; diabetes w/o none:19199}. Last A1C in the office was:  Lab Results  Component Value Date   HGBA1C 5.9 (H) 06/27/2016   Patient is on Vitamin D supplement and at goal at recent check:   Lab Results  Component Value Date   VD25OH 86 02/10/2017        Current Medications:  Current Outpatient Medications on File Prior to Visit  Medication Sig  . ALPRAZolam (XANAX) 1 MG tablet Take 0.5-1 tablets (0.5-1 mg total) by mouth at  bedtime as needed.  Marland Kitchen amphetamine-dextroamphetamine (ADDERALL) 20 MG tablet Take 1/2 to 1 tablet twice daily as needed for alertness and depression.  Marland Kitchen aspirin EC 81 MG tablet Take 81 mg by mouth daily.  . bisoprolol-hydrochlorothiazide (ZIAC) 10-6.25 MG tablet TAKE 1 TABLET BY MOUTH EVERY DAY  . Cholecalciferol (VITAMIN D-3) 5000 UNITS TABS Take 1 tablet by mouth 2 (two) times  daily.  . citalopram (CELEXA) 40 MG tablet Take 1 tablet (40 mg total) by mouth daily.  . clopidogrel (PLAVIX) 75 MG tablet Take 1 tablet (75 mg total) by mouth daily.  . finasteride (PROSCAR) 5 MG tablet Take 1 tablet (5 mg total) by mouth daily.  Marland Kitchen losartan (COZAAR) 100 MG tablet TAKE 1 TABLET BY MOUTH EVERY DAY  . minoxidil (LONITEN) 10 MG tablet TAKE 1/2 TO 1 TABLET DAILY FOR BLOOD PRESSURE AS DIRECTED  . terazosin (HYTRIN) 2 MG capsule TAKE 1 CAPSULE (2 MG TOTAL) BY MOUTH AT BEDTIME.   No current facility-administered medications on file prior to visit.      Allergies: No Known Allergies   Medical History:  Past Medical History:  Diagnosis Date  . Arthritis   . Cerebral embolism with cerebral infarction ([place]) 02/10/2011  . Confusion   . GERD (gastroesophageal reflux disease)   . H/O dizziness   . Hyperlipidemia   . Hypertension   . Other testicular hypofunction   . Stroke ([place]) 05/2012   affected memory  . TIA (transient ischemic attack)   . Vitamin D deficiency    Family history- Reviewed and unchanged Social history- Reviewed and unchanged   Review of Systems:  Review of Systems  Constitutional: Negative for malaise/fatigue and weight loss.  HENT: Negative for hearing loss and tinnitus.   Eyes: Negative for blurred vision and double vision.  Respiratory: Negative for cough, shortness of breath and wheezing.   Cardiovascular: Negative for chest pain, palpitations, orthopnea, claudication and leg swelling.  Gastrointestinal: Negative for abdominal pain, blood in stool, constipation, diarrhea, heartburn, melena, nausea and vomiting.  Genitourinary: Negative.   Musculoskeletal: Negative for joint pain and myalgias.  Skin: Negative for rash.  Neurological: Negative for dizziness, tingling, sensory change, weakness and headaches.  Endo/Heme/Allergies: Negative for polydipsia.  Psychiatric/Behavioral: Negative.   All other systems reviewed and are negative.   Physical  Exam: There were no vitals taken for this visit. Wt Readings from Last 3 Encounters:  02/10/17 198 lb (89.8 kg)  11/03/16 199 lb (90.3 kg)  06/27/16 202 lb 9.6 oz (91.9 kg)   General Appearance: Well nourished, in no apparent distress. Eyes: PERRLA, EOMs, conjunctiva no swelling or erythema Sinuses: No Frontal/maxillary tenderness ENT/Mouth: Ext aud canals clear, TMs without erythema, bulging. No erythema, swelling, or exudate on post pharynx.  Tonsils not swollen or erythematous. Hearing normal.  Neck: Supple, thyroid normal.  Respiratory: Respiratory effort normal, BS equal bilaterally without rales, rhonchi, wheezing or stridor.  Cardio: RRR with no MRGs. Brisk peripheral pulses without edema.  Abdomen: Soft, + BS.  Non tender, no guarding, rebound, hernias, masses. Lymphatics: Non tender without lymphadenopathy.  Musculoskeletal: Full ROM, 5/5 strength, {PSY - GAIT AND STATION:22860} gait Skin: Warm, dry without rashes, lesions, ecchymosis.  Neuro: Cranial nerves intact. No cerebellar symptoms.  Psych: Awake and oriented X 3, normal affect, Insight and Judgment appropriate.    Izora Ribas, NP 2:04 PM [facility]

## 2017-06-03 ENCOUNTER — Ambulatory Visit: Payer: Self-pay | Admitting: Adult Health

## 2017-06-08 NOTE — Progress Notes (Deleted)
FOLLOW UP  Assessment and Plan:   Hypertension Well controlled with current medications  Monitor blood pressure at home; patient to call if consistently greater than 130/80 Continue DASH diet.   Reminder to go to the ER if any CP, SOB, nausea, dizziness, severe HA, changes vision/speech, left arm numbness and tingling and jaw pain.  Cholesterol Currently above goal; no longer on treatment secondary to age and progressive dementia Continue low cholesterol diet and exercise.  Defer lipid panel   Prediabetes Continue diet and exercise.  Perform daily foot/skin check, notify office of any concerning changes.  Check A1C  Obesity with co morbidities Long discussion about weight loss, diet, and exercise Recommended diet heavy in fruits and veggies and low in animal meats, cheeses, and dairy products, appropriate calorie intake Discussed ideal weight for height (below ***) and initial weight goal (***) Patient will work on *** Will follow up in 3 months  Vitamin D Def At goal at last visit; continue supplementation to maintain goal of 70-100 Defer Vit D level  Depression ***   Continue diet and meds as discussed. Further disposition pending results of labs. Discussed med's effects and SE's.   Over 30 minutes of exam, counseling, chart review, and critical decision making was performed.   Future Appointments  Date Time Provider [place]  06/10/2017  2:00 PM Liane Comber, NP [facility] None  08/31/2017  3:00 PM Unk Pinto, MD [facility] None    ----------------------------------------------------------------------------------------------------------------------  HPI 84 y.o. male  presents for 3 month follow up on hypertension, cholesterol, prediabetes, weight and vitamin D deficiency. He has hx of SDAT with hx of CVA/ suspected vascular component for which he continues on plavix. His family has shared ongoing concerns with lack of motivation/energy at home;  celexa was increased from 20 mg to 40 mg at last visit; he is also prescribed xanax 0.5-1 mg to be used as needed at night for anxiety/sleep and has been prescribed adderall 20 mg by Dr. Melford Aase to be used as needed for energy and depression. ***  Urinary symptoms?  BMI is There is no height or weight on file to calculate BMI., he {HAS HAS KDT:26712} been working on diet and exercise. Wt Readings from Last 3 Encounters:  02/10/17 198 lb (89.8 kg)  11/03/16 199 lb (90.3 kg)  06/27/16 202 lb 9.6 oz (91.9 kg)   His blood pressure {HAS HAS NOT:18834} been controlled at home, today their BP is    He {DOES_DOES WPY:09983} workout. He denies chest pain, shortness of breath, dizziness.   He is not on cholesterol medication secondary to age and advancing dementia and denies myalgias. His cholesterol is not at goal. The cholesterol last visit was:   Lab Results  Component Value Date   CHOL 243 (H) 02/10/2017   HDL 40 (L) 02/10/2017   LDLCALC 178 (H) 02/10/2017   TRIG 120 02/10/2017   CHOLHDL 6.1 (H) 02/10/2017    He {Has/has not:18111} been working on diet and exercise for prediabetes, and denies {Symptoms; diabetes w/o none:19199}. Last A1C in the office was:  Lab Results  Component Value Date   HGBA1C 5.9 (H) 06/27/2016   Patient is on Vitamin D supplement and at goal at recent check:   Lab Results  Component Value Date   VD25OH 86 02/10/2017        Current Medications:  Current Outpatient Medications on File Prior to Visit  Medication Sig  . ALPRAZolam (XANAX) 1 MG tablet Take 0.5-1 tablets (0.5-1 mg total) by  mouth at bedtime as needed.  Marland Kitchen amphetamine-dextroamphetamine (ADDERALL) 20 MG tablet Take 1/2 to 1 tablet twice daily as needed for alertness and depression.  Marland Kitchen aspirin EC 81 MG tablet Take 81 mg by mouth daily.  . bisoprolol-hydrochlorothiazide (ZIAC) 10-6.25 MG tablet TAKE 1 TABLET BY MOUTH EVERY DAY  . Cholecalciferol (VITAMIN D-3) 5000 UNITS TABS Take 1 tablet by mouth 2  (two) times daily.  . citalopram (CELEXA) 40 MG tablet Take 1 tablet (40 mg total) by mouth daily.  . clopidogrel (PLAVIX) 75 MG tablet Take 1 tablet (75 mg total) by mouth daily.  . finasteride (PROSCAR) 5 MG tablet Take 1 tablet (5 mg total) by mouth daily.  Marland Kitchen losartan (COZAAR) 100 MG tablet TAKE 1 TABLET BY MOUTH EVERY DAY  . minoxidil (LONITEN) 10 MG tablet TAKE 1/2 TO 1 TABLET DAILY FOR BLOOD PRESSURE AS DIRECTED  . terazosin (HYTRIN) 2 MG capsule TAKE 1 CAPSULE (2 MG TOTAL) BY MOUTH AT BEDTIME.   No current facility-administered medications on file prior to visit.      Allergies: No Known Allergies   Medical History:  Past Medical History:  Diagnosis Date  . Arthritis   . Cerebral embolism with cerebral infarction ([place]) 02/10/2011  . Confusion   . GERD (gastroesophageal reflux disease)   . H/O dizziness   . Hyperlipidemia   . Hypertension   . Other testicular hypofunction   . Stroke ([place]) 05/2012   affected memory  . TIA (transient ischemic attack)   . Vitamin D deficiency    Family history- Reviewed and unchanged Social history- Reviewed and unchanged   Review of Systems:  Review of Systems  Constitutional: Negative for malaise/fatigue and weight loss.  HENT: Negative for hearing loss and tinnitus.   Eyes: Negative for blurred vision and double vision.  Respiratory: Negative for cough, shortness of breath and wheezing.   Cardiovascular: Negative for chest pain, palpitations, orthopnea, claudication and leg swelling.  Gastrointestinal: Negative for abdominal pain, blood in stool, constipation, diarrhea, heartburn, melena, nausea and vomiting.  Genitourinary: Negative.   Musculoskeletal: Negative for joint pain and myalgias.  Skin: Negative for rash.  Neurological: Negative for dizziness, tingling, sensory change, weakness and headaches.  Endo/Heme/Allergies: Negative for polydipsia.  Psychiatric/Behavioral: Negative.   All other systems reviewed and are  negative.   Physical Exam: There were no vitals taken for this visit. Wt Readings from Last 3 Encounters:  02/10/17 198 lb (89.8 kg)  11/03/16 199 lb (90.3 kg)  06/27/16 202 lb 9.6 oz (91.9 kg)   General Appearance: Well nourished, in no apparent distress. Eyes: PERRLA, EOMs, conjunctiva no swelling or erythema Sinuses: No Frontal/maxillary tenderness ENT/Mouth: Ext aud canals clear, TMs without erythema, bulging. No erythema, swelling, or exudate on post pharynx.  Tonsils not swollen or erythematous. Hearing normal.  Neck: Supple, thyroid normal.  Respiratory: Respiratory effort normal, BS equal bilaterally without rales, rhonchi, wheezing or stridor.  Cardio: RRR with no MRGs. Brisk peripheral pulses without edema.  Abdomen: Soft, + BS.  Non tender, no guarding, rebound, hernias, masses. Lymphatics: Non tender without lymphadenopathy.  Musculoskeletal: Full ROM, 5/5 strength, {PSY - GAIT AND STATION:22860} gait Skin: Warm, dry without rashes, lesions, ecchymosis.  Neuro: Cranial nerves intact. No cerebellar symptoms.  Psych: Awake and oriented X 3, normal affect, Insight and Judgment appropriate.    Izora Ribas, NP 1:32 PM [facility]

## 2017-06-10 ENCOUNTER — Ambulatory Visit: Payer: Self-pay | Admitting: Adult Health

## 2017-06-16 ENCOUNTER — Other Ambulatory Visit: Payer: Self-pay | Admitting: Internal Medicine

## 2017-06-16 DIAGNOSIS — I10 Essential (primary) hypertension: Secondary | ICD-10-CM

## 2017-07-16 ENCOUNTER — Emergency Department (HOSPITAL_COMMUNITY): Payer: PPO

## 2017-07-16 ENCOUNTER — Inpatient Hospital Stay (HOSPITAL_COMMUNITY)
Admission: EM | Admit: 2017-07-16 | Discharge: 2017-07-18 | DRG: 064 | Disposition: A | Discharge status: Home Health | Payer: PPO | Attending: Internal Medicine | Admitting: Internal Medicine

## 2017-07-16 ENCOUNTER — Other Ambulatory Visit: Payer: Self-pay

## 2017-07-16 ENCOUNTER — Observation Stay (HOSPITAL_COMMUNITY): Payer: PPO

## 2017-07-16 ENCOUNTER — Encounter (HOSPITAL_COMMUNITY): Payer: Self-pay

## 2017-07-16 DIAGNOSIS — Z7982 Long term (current) use of aspirin: Secondary | ICD-10-CM

## 2017-07-16 DIAGNOSIS — F3341 Major depressive disorder, recurrent, in partial remission: Secondary | ICD-10-CM | POA: Diagnosis not present

## 2017-07-16 DIAGNOSIS — R29898 Other symptoms and signs involving the musculoskeletal system: Secondary | ICD-10-CM

## 2017-07-16 DIAGNOSIS — Z79899 Other long term (current) drug therapy: Secondary | ICD-10-CM | POA: Diagnosis not present

## 2017-07-16 DIAGNOSIS — N183 Chronic kidney disease, stage 3 unspecified: Secondary | ICD-10-CM | POA: Diagnosis present

## 2017-07-16 DIAGNOSIS — I10 Essential (primary) hypertension: Secondary | ICD-10-CM | POA: Diagnosis present

## 2017-07-16 DIAGNOSIS — R7989 Other specified abnormal findings of blood chemistry: Secondary | ICD-10-CM

## 2017-07-16 DIAGNOSIS — N401 Enlarged prostate with lower urinary tract symptoms: Secondary | ICD-10-CM | POA: Diagnosis present

## 2017-07-16 DIAGNOSIS — R2242 Localized swelling, mass and lump, left lower limb: Secondary | ICD-10-CM | POA: Diagnosis not present

## 2017-07-16 DIAGNOSIS — Z8673 Personal history of transient ischemic attack (TIA), and cerebral infarction without residual deficits: Secondary | ICD-10-CM

## 2017-07-16 DIAGNOSIS — R748 Abnormal levels of other serum enzymes: Secondary | ICD-10-CM | POA: Diagnosis not present

## 2017-07-16 DIAGNOSIS — M7989 Other specified soft tissue disorders: Secondary | ICD-10-CM

## 2017-07-16 DIAGNOSIS — F0390 Unspecified dementia without behavioral disturbance: Secondary | ICD-10-CM | POA: Diagnosis present

## 2017-07-16 DIAGNOSIS — I63531 Cerebral infarction due to unspecified occlusion or stenosis of right posterior cerebral artery: Secondary | ICD-10-CM | POA: Diagnosis not present

## 2017-07-16 DIAGNOSIS — R29702 NIHSS score 2: Secondary | ICD-10-CM | POA: Diagnosis present

## 2017-07-16 DIAGNOSIS — Z87891 Personal history of nicotine dependence: Secondary | ICD-10-CM

## 2017-07-16 DIAGNOSIS — I5033 Acute on chronic diastolic (congestive) heart failure: Secondary | ICD-10-CM | POA: Diagnosis present

## 2017-07-16 DIAGNOSIS — Z7902 Long term (current) use of antithrombotics/antiplatelets: Secondary | ICD-10-CM | POA: Diagnosis not present

## 2017-07-16 DIAGNOSIS — R778 Other specified abnormalities of plasma proteins: Secondary | ICD-10-CM

## 2017-07-16 DIAGNOSIS — G459 Transient cerebral ischemic attack, unspecified: Secondary | ICD-10-CM | POA: Diagnosis present

## 2017-07-16 DIAGNOSIS — E559 Vitamin D deficiency, unspecified: Secondary | ICD-10-CM | POA: Diagnosis not present

## 2017-07-16 DIAGNOSIS — R29703 NIHSS score 3: Secondary | ICD-10-CM | POA: Diagnosis not present

## 2017-07-16 DIAGNOSIS — I130 Hypertensive heart and chronic kidney disease with heart failure and stage 1 through stage 4 chronic kidney disease, or unspecified chronic kidney disease: Secondary | ICD-10-CM | POA: Diagnosis present

## 2017-07-16 DIAGNOSIS — E785 Hyperlipidemia, unspecified: Secondary | ICD-10-CM | POA: Diagnosis present

## 2017-07-16 DIAGNOSIS — I6319 Cerebral infarction due to embolism of other precerebral artery: Secondary | ICD-10-CM

## 2017-07-16 DIAGNOSIS — M1990 Unspecified osteoarthritis, unspecified site: Secondary | ICD-10-CM | POA: Diagnosis not present

## 2017-07-16 DIAGNOSIS — G8311 Monoplegia of lower limb affecting right dominant side: Secondary | ICD-10-CM | POA: Diagnosis not present

## 2017-07-16 DIAGNOSIS — I361 Nonrheumatic tricuspid (valve) insufficiency: Secondary | ICD-10-CM | POA: Diagnosis not present

## 2017-07-16 DIAGNOSIS — I517 Cardiomegaly: Secondary | ICD-10-CM | POA: Diagnosis not present

## 2017-07-16 DIAGNOSIS — R609 Edema, unspecified: Secondary | ICD-10-CM | POA: Diagnosis not present

## 2017-07-16 DIAGNOSIS — R29704 NIHSS score 4: Secondary | ICD-10-CM | POA: Diagnosis not present

## 2017-07-16 DIAGNOSIS — R531 Weakness: Secondary | ICD-10-CM | POA: Diagnosis not present

## 2017-07-16 DIAGNOSIS — I639 Cerebral infarction, unspecified: Secondary | ICD-10-CM | POA: Diagnosis not present

## 2017-07-16 DIAGNOSIS — E291 Testicular hypofunction: Secondary | ICD-10-CM | POA: Diagnosis present

## 2017-07-16 DIAGNOSIS — I63532 Cerebral infarction due to unspecified occlusion or stenosis of left posterior cerebral artery: Secondary | ICD-10-CM | POA: Diagnosis not present

## 2017-07-16 DIAGNOSIS — K219 Gastro-esophageal reflux disease without esophagitis: Secondary | ICD-10-CM | POA: Diagnosis not present

## 2017-07-16 DIAGNOSIS — R413 Other amnesia: Secondary | ICD-10-CM | POA: Diagnosis not present

## 2017-07-16 LAB — COMPREHENSIVE METABOLIC PANEL
ALK PHOS: 50 U/L (ref 38–126)
ALT: 11 U/L — ABNORMAL LOW (ref 17–63)
ANION GAP: 9 (ref 5–15)
AST: 13 U/L — ABNORMAL LOW (ref 15–41)
Albumin: 3.3 g/dL — ABNORMAL LOW (ref 3.5–5.0)
BUN: 20 mg/dL (ref 6–20)
CALCIUM: 9 mg/dL (ref 8.9–10.3)
CO2: 25 mmol/L (ref 22–32)
Chloride: 108 mmol/L (ref 101–111)
Creatinine, Ser: 1.55 mg/dL — ABNORMAL HIGH (ref 0.61–1.24)
GFR calc Af Amer: 47 mL/min — ABNORMAL LOW (ref >60)
GFR calc non Af Amer: 40 mL/min — ABNORMAL LOW (ref >60)
GLUCOSE: 148 mg/dL — AB (ref 65–99)
Potassium: 3.8 mmol/L (ref 3.5–5.1)
SODIUM: 142 mmol/L (ref 135–145)
Total Bilirubin: 0.8 mg/dL (ref 0.3–1.2)
Total Protein: 5.9 g/dL — ABNORMAL LOW (ref 6.5–8.1)

## 2017-07-16 LAB — I-STAT CHEM 8, ED
BUN: 21 mg/dL — AB (ref 6–20)
Calcium, Ion: 1.19 mmol/L (ref 1.15–1.40)
Chloride: 105 mmol/L (ref 101–111)
Creatinine, Ser: 1.6 mg/dL — ABNORMAL HIGH (ref 0.61–1.24)
Glucose, Bld: 144 mg/dL — ABNORMAL HIGH (ref 65–99)
HEMATOCRIT: 38 % — AB (ref 39.0–52.0)
HEMOGLOBIN: 12.9 g/dL — AB (ref 13.0–17.0)
Potassium: 3.9 mmol/L (ref 3.5–5.1)
Sodium: 142 mmol/L (ref 135–145)
TCO2: 25 mmol/L (ref 22–32)

## 2017-07-16 LAB — CBC
HCT: 40.4 % (ref 39.0–52.0)
Hemoglobin: 13.4 g/dL (ref 13.0–17.0)
MCH: 28.5 pg (ref 26.0–34.0)
MCHC: 33.2 g/dL (ref 30.0–36.0)
MCV: 85.8 fL (ref 78.0–100.0)
PLATELETS: 186 10*3/uL (ref 150–400)
RBC: 4.71 MIL/uL (ref 4.22–5.81)
RDW: 13.6 % (ref 11.5–15.5)
WBC: 4.5 10*3/uL (ref 4.0–10.5)

## 2017-07-16 LAB — I-STAT TROPONIN, ED: Troponin i, poc: 0.3 ng/mL (ref 0.00–0.08)

## 2017-07-16 LAB — DIFFERENTIAL
BASOS ABS: 0 10*3/uL (ref 0.0–0.1)
Basophils Relative: 0 %
EOS PCT: 1 %
Eosinophils Absolute: 0.1 10*3/uL (ref 0.0–0.7)
LYMPHS PCT: 29 %
Lymphs Abs: 1.3 10*3/uL (ref 0.7–4.0)
Monocytes Absolute: 0.2 10*3/uL (ref 0.1–1.0)
Monocytes Relative: 5 %
NEUTROS ABS: 2.9 10*3/uL (ref 1.7–7.7)
Neutrophils Relative %: 65 %

## 2017-07-16 LAB — PROTIME-INR
INR: 1.09
PROTHROMBIN TIME: 14 s (ref 11.4–15.2)

## 2017-07-16 LAB — CBG MONITORING, ED: GLUCOSE-CAPILLARY: 123 mg/dL — AB (ref 65–99)

## 2017-07-16 LAB — APTT: aPTT: 32 seconds (ref 24–36)

## 2017-07-16 LAB — BRAIN NATRIURETIC PEPTIDE: B Natriuretic Peptide: 516.4 pg/mL — ABNORMAL HIGH (ref 0.0–100.0)

## 2017-07-16 MED ORDER — STROKE: EARLY STAGES OF RECOVERY BOOK
Freq: Once | Status: AC
  Administered 2017-07-17: 01:00:00
  Filled 2017-07-16: qty 1

## 2017-07-16 MED ORDER — FUROSEMIDE 10 MG/ML IJ SOLN
20.0000 mg | Freq: Once | INTRAMUSCULAR | Status: AC
  Administered 2017-07-16: 20 mg via INTRAVENOUS
  Filled 2017-07-16: qty 2

## 2017-07-16 MED ORDER — ASPIRIN 325 MG PO TABS
325.0000 mg | ORAL_TABLET | Freq: Every day | ORAL | Status: DC
  Administered 2017-07-17 – 2017-07-18 (×2): 325 mg via ORAL
  Filled 2017-07-16 (×2): qty 1

## 2017-07-16 MED ORDER — ASPIRIN 300 MG RE SUPP: 300.0000 mg | Freq: Every day | RECTAL | Status: DC

## 2017-07-16 MED ORDER — SODIUM CHLORIDE 0.9 % IV BOLUS
500.0000 mL | Freq: Once | INTRAVENOUS | Status: AC
  Administered 2017-07-16: 500 mL via INTRAVENOUS

## 2017-07-16 MED ORDER — TERAZOSIN HCL 2 MG PO CAPS
2.0000 mg | ORAL_CAPSULE | Freq: Every day | ORAL | Status: DC
  Administered 2017-07-17 (×2): 2 mg via ORAL
  Filled 2017-07-16 (×3): qty 1

## 2017-07-16 MED ORDER — ACETAMINOPHEN 650 MG RE SUPP: 650.0000 mg | RECTAL | Status: DC | PRN

## 2017-07-16 MED ORDER — LOSARTAN POTASSIUM 50 MG PO TABS: 100.0000 mg | ORAL_TABLET | Freq: Every day | ORAL | Status: DC

## 2017-07-16 MED ORDER — MINOXIDIL 2.5 MG PO TABS: 5.0000 mg | ORAL_TABLET | Freq: Every day | ORAL | Status: DC

## 2017-07-16 MED ORDER — ACETAMINOPHEN 160 MG/5ML PO SOLN: 650.0000 mg | ORAL | Status: DC | PRN

## 2017-07-16 MED ORDER — ENOXAPARIN SODIUM 40 MG/0.4ML ~~LOC~~ SOLN
40.0000 mg | Freq: Every day | SUBCUTANEOUS | Status: DC
  Administered 2017-07-17 – 2017-07-18 (×2): 40 mg via SUBCUTANEOUS
  Filled 2017-07-16 (×2): qty 0.4

## 2017-07-16 MED ORDER — ACETAMINOPHEN 325 MG PO TABS: 650.0000 mg | ORAL_TABLET | ORAL | Status: DC | PRN

## 2017-07-16 MED ORDER — FINASTERIDE 5 MG PO TABS
5.0000 mg | ORAL_TABLET | Freq: Every day | ORAL | Status: DC
  Administered 2017-07-17 – 2017-07-18 (×2): 5 mg via ORAL
  Filled 2017-07-16 (×2): qty 1

## 2017-07-16 MED ORDER — ALPRAZOLAM 0.5 MG PO TABS: 0.5000 mg | ORAL_TABLET | Freq: Every evening | ORAL | Status: DC | PRN

## 2017-07-16 MED ORDER — CITALOPRAM HYDROBROMIDE 40 MG PO TABS
40.0000 mg | ORAL_TABLET | Freq: Every day | ORAL | Status: DC
  Administered 2017-07-17 – 2017-07-18 (×2): 40 mg via ORAL
  Filled 2017-07-16 (×2): qty 1

## 2017-07-16 MED ORDER — CLOPIDOGREL BISULFATE 75 MG PO TABS
75.0000 mg | ORAL_TABLET | Freq: Every day | ORAL | Status: DC
  Administered 2017-07-17 – 2017-07-18 (×2): 75 mg via ORAL
  Filled 2017-07-16 (×2): qty 1

## 2017-07-16 NOTE — ED Provider Notes (Addendum)
Patient placed in Quick Look pathway, seen and evaluated   Chief Complaint: Right leg numbness and weakness  HPI:   Patient is a poor historian.  Wife gives most of the details.  Reports that when she went to wake patient up today around lunchtime he complained that his right leg was numb and could not "feel it".  Patient states that this has resolved at this time however at the end of examination states that it came back.  Patient has history of prior stroke unsure of residual symptoms.  Patient states that he cannot "remember when his symptoms started".  Denies any visual changes, headache, chest pain, shortness of breath a aphasia, ataxia, abdominal pain, nausea or emesis.  Unsure when symptoms started.  Cannot remember if he had any of the symptoms last night before he went to bed.  Wife does report some twitching of his right face 1 week ago.  ROS: Paresthesias, weakness (one)  Physical Exam:   Gen: No distress  Neuro: Awake and Alert  Skin: Warm    Focused Exam: The patient is alert, attentive, and oriented x 2 which is baseline.Marland Kitchen Speech is clear. Cranial nerve II-VII grossly intact. Negative pronator drift. Sensation intact. Strength 4/5 and right lower extremity.  All other extremities are 5 out of 5.  No drift noted.  No visual field defects.  5 in all extremities. Reflexes 2+ and symmetric at biceps, triceps, knees, and ankles. Rapid alternating movement and fine finger movements intact.  DP and radial pulses are 2+ bilaterally.  All extremities are warm to touch.  Heart regular rate and rhythm.  Lungs clear to auscultation bilaterally.  No focal abdominal tenderness.   Initiation of care has begun. The patient has been counseled on the process, plan, and necessity for staying for the completion/evaluation, and the remainder of the medical screening examination   Discussed with the patient that exiting the department prior to completion of the work-up is AMA and there is no guarantee  that there are no emergency medical conditions present.     Doristine Devoid, PA-C 07/16/17 1802    Doristine Devoid, PA-C 07/16/17 Johnnye Lana    Carmin Muskrat, MD 07/16/17 1904

## 2017-07-16 NOTE — ED Notes (Signed)
Doppler pulse in right foot found by PA.

## 2017-07-16 NOTE — ED Notes (Signed)
Patient transported to MRI 

## 2017-07-16 NOTE — ED Notes (Signed)
Patient transported to CT 

## 2017-07-16 NOTE — ED Notes (Signed)
Report taken on trop of 0.30 from mini-lab.  Pt. Roomed.

## 2017-07-16 NOTE — ED Triage Notes (Signed)
Wife at bedside states that pt has had several strokes and the last left him "where he cant remember anything' she reports that when the pt woke up he was c/o right leg numbness stating that it has been intermittent today. Wife reports she noticed that his mouth was pulling on Friday. Pt denies CP, denies SOB.

## 2017-07-16 NOTE — ED Notes (Signed)
Lance Ramirez [phone] [facility]) Call daughter for any update and information
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
## 2017-07-17 ENCOUNTER — Observation Stay (HOSPITAL_COMMUNITY): Payer: PPO

## 2017-07-17 ENCOUNTER — Observation Stay (HOSPITAL_BASED_OUTPATIENT_CLINIC_OR_DEPARTMENT_OTHER): Payer: PPO

## 2017-07-17 DIAGNOSIS — N183 Chronic kidney disease, stage 3 (moderate): Secondary | ICD-10-CM

## 2017-07-17 DIAGNOSIS — F3341 Major depressive disorder, recurrent, in partial remission: Secondary | ICD-10-CM | POA: Diagnosis present

## 2017-07-17 DIAGNOSIS — Z8673 Personal history of transient ischemic attack (TIA), and cerebral infarction without residual deficits: Secondary | ICD-10-CM | POA: Diagnosis not present

## 2017-07-17 DIAGNOSIS — I361 Nonrheumatic tricuspid (valve) insufficiency: Secondary | ICD-10-CM | POA: Diagnosis not present

## 2017-07-17 DIAGNOSIS — Z7982 Long term (current) use of aspirin: Secondary | ICD-10-CM | POA: Diagnosis not present

## 2017-07-17 DIAGNOSIS — E785 Hyperlipidemia, unspecified: Secondary | ICD-10-CM | POA: Diagnosis present

## 2017-07-17 DIAGNOSIS — E559 Vitamin D deficiency, unspecified: Secondary | ICD-10-CM | POA: Diagnosis present

## 2017-07-17 DIAGNOSIS — Z87891 Personal history of nicotine dependence: Secondary | ICD-10-CM | POA: Diagnosis not present

## 2017-07-17 DIAGNOSIS — G8311 Monoplegia of lower limb affecting right dominant side: Secondary | ICD-10-CM | POA: Diagnosis present

## 2017-07-17 DIAGNOSIS — R29704 NIHSS score 4: Secondary | ICD-10-CM | POA: Diagnosis not present

## 2017-07-17 DIAGNOSIS — I10 Essential (primary) hypertension: Secondary | ICD-10-CM

## 2017-07-17 DIAGNOSIS — N401 Enlarged prostate with lower urinary tract symptoms: Secondary | ICD-10-CM | POA: Diagnosis present

## 2017-07-17 DIAGNOSIS — I63531 Cerebral infarction due to unspecified occlusion or stenosis of right posterior cerebral artery: Secondary | ICD-10-CM | POA: Diagnosis present

## 2017-07-17 DIAGNOSIS — R609 Edema, unspecified: Secondary | ICD-10-CM | POA: Diagnosis not present

## 2017-07-17 DIAGNOSIS — Z7902 Long term (current) use of antithrombotics/antiplatelets: Secondary | ICD-10-CM | POA: Diagnosis not present

## 2017-07-17 DIAGNOSIS — E291 Testicular hypofunction: Secondary | ICD-10-CM | POA: Diagnosis present

## 2017-07-17 DIAGNOSIS — M1990 Unspecified osteoarthritis, unspecified site: Secondary | ICD-10-CM | POA: Diagnosis present

## 2017-07-17 DIAGNOSIS — G459 Transient cerebral ischemic attack, unspecified: Secondary | ICD-10-CM

## 2017-07-17 DIAGNOSIS — F0390 Unspecified dementia without behavioral disturbance: Secondary | ICD-10-CM | POA: Diagnosis present

## 2017-07-17 DIAGNOSIS — I639 Cerebral infarction, unspecified: Secondary | ICD-10-CM | POA: Diagnosis present

## 2017-07-17 DIAGNOSIS — F0150 Vascular dementia without behavioral disturbance: Secondary | ICD-10-CM

## 2017-07-17 DIAGNOSIS — I6319 Cerebral infarction due to embolism of other precerebral artery: Secondary | ICD-10-CM

## 2017-07-17 DIAGNOSIS — R29702 NIHSS score 2: Secondary | ICD-10-CM | POA: Diagnosis present

## 2017-07-17 DIAGNOSIS — I5033 Acute on chronic diastolic (congestive) heart failure: Secondary | ICD-10-CM | POA: Diagnosis present

## 2017-07-17 DIAGNOSIS — I130 Hypertensive heart and chronic kidney disease with heart failure and stage 1 through stage 4 chronic kidney disease, or unspecified chronic kidney disease: Secondary | ICD-10-CM | POA: Diagnosis present

## 2017-07-17 DIAGNOSIS — R748 Abnormal levels of other serum enzymes: Secondary | ICD-10-CM

## 2017-07-17 DIAGNOSIS — K219 Gastro-esophageal reflux disease without esophagitis: Secondary | ICD-10-CM | POA: Diagnosis present

## 2017-07-17 DIAGNOSIS — Z79899 Other long term (current) drug therapy: Secondary | ICD-10-CM | POA: Diagnosis not present

## 2017-07-17 DIAGNOSIS — R29703 NIHSS score 3: Secondary | ICD-10-CM | POA: Diagnosis not present

## 2017-07-17 LAB — CBC
HCT: 36.7 % — ABNORMAL LOW (ref 39.0–52.0)
HEMATOCRIT: 35.5 % — AB (ref 39.0–52.0)
Hemoglobin: 12 g/dL — ABNORMAL LOW (ref 13.0–17.0)
Hemoglobin: 12.1 g/dL — ABNORMAL LOW (ref 13.0–17.0)
MCH: 28.8 pg (ref 26.0–34.0)
MCH: 28.1 pg (ref 26.0–34.0)
MCHC: 33.8 g/dL (ref 30.0–36.0)
MCHC: 33 g/dL (ref 30.0–36.0)
MCV: 85.3 fL (ref 78.0–100.0)
MCV: 85.2 fL (ref 78.0–100.0)
PLATELETS: 168 10*3/uL (ref 150–400)
PLATELETS: 175 10*3/uL (ref 150–400)
RBC: 4.16 MIL/uL — ABNORMAL LOW (ref 4.22–5.81)
RBC: 4.31 MIL/uL (ref 4.22–5.81)
RDW: 13.5 % (ref 11.5–15.5)
RDW: 13.5 % (ref 11.5–15.5)
WBC: 4.3 10*3/uL (ref 4.0–10.5)
WBC: 4.8 10*3/uL (ref 4.0–10.5)

## 2017-07-17 LAB — COMPREHENSIVE METABOLIC PANEL
ALK PHOS: 45 U/L (ref 38–126)
ALT: 12 U/L — AB (ref 17–63)
AST: 14 U/L — AB (ref 15–41)
Albumin: 3 g/dL — ABNORMAL LOW (ref 3.5–5.0)
Anion gap: 10 (ref 5–15)
BUN: 21 mg/dL — AB (ref 6–20)
CALCIUM: 8.6 mg/dL — AB (ref 8.9–10.3)
CHLORIDE: 108 mmol/L (ref 101–111)
CO2: 25 mmol/L (ref 22–32)
CREATININE: 1.63 mg/dL — AB (ref 0.61–1.24)
GFR calc non Af Amer: 38 mL/min — ABNORMAL LOW (ref >60)
GFR, EST AFRICAN AMERICAN: 44 mL/min — AB (ref >60)
Glucose, Bld: 106 mg/dL — ABNORMAL HIGH (ref 65–99)
Potassium: 3.5 mmol/L (ref 3.5–5.1)
SODIUM: 143 mmol/L (ref 135–145)
Total Bilirubin: 0.5 mg/dL (ref 0.3–1.2)
Total Protein: 5.3 g/dL — ABNORMAL LOW (ref 6.5–8.1)

## 2017-07-17 LAB — ECHOCARDIOGRAM COMPLETE
Height: 71 in
WEIGHTICAEL: 3128.77 [oz_av]

## 2017-07-17 LAB — LIPID PANEL
CHOL/HDL RATIO: 5.3 ratio
Cholesterol: 154 mg/dL (ref 0–200)
HDL: 29 mg/dL — AB (ref >40)
LDL CALC: 105 mg/dL — AB (ref 0–99)
TRIGLYCERIDES: 101 mg/dL (ref <150)
VLDL: 20 mg/dL (ref 0–40)

## 2017-07-17 LAB — HEMOGLOBIN A1C
Hgb A1c MFr Bld: 5.7 % — ABNORMAL HIGH (ref 4.8–5.6)
Mean Plasma Glucose: 116.89 mg/dL

## 2017-07-17 LAB — TROPONIN I
Troponin I: 0.33 ng/mL (ref <0.03)
Troponin I: 0.37 ng/mL (ref <0.03)
Troponin I: 0.37 ng/mL (ref <0.03)

## 2017-07-17 LAB — CREATININE, SERUM
Creatinine, Ser: 1.61 mg/dL — ABNORMAL HIGH (ref 0.61–1.24)
GFR calc Af Amer: 45 mL/min — ABNORMAL LOW (ref >60)
GFR calc non Af Amer: 38 mL/min — ABNORMAL LOW (ref >60)

## 2017-07-17 MED ORDER — HYDRALAZINE HCL 20 MG/ML IJ SOLN: 10.0000 mg | INTRAMUSCULAR | Status: DC | PRN

## 2017-07-17 MED ORDER — ATORVASTATIN CALCIUM 80 MG PO TABS
80.0000 mg | ORAL_TABLET | Freq: Every day | ORAL | Status: DC
  Administered 2017-07-17 – 2017-07-18 (×2): 80 mg via ORAL
  Filled 2017-07-17 (×2): qty 1

## 2017-07-17 MED ORDER — BISOPROLOL FUMARATE 10 MG PO TABS
10.0000 mg | ORAL_TABLET | Freq: Every day | ORAL | Status: DC
  Administered 2017-07-17 – 2017-07-18 (×2): 10 mg via ORAL
  Filled 2017-07-17 (×2): qty 1

## 2017-07-17 NOTE — Evaluation (Signed)
Occupational Therapy Evaluation Patient Details Name: Lance Ramirez MRN: [id number] DOB: 1933/07/29 Today's Date: 07/17/2017    History of Present Illness Pt is an 84 y.o. male with past medical history of stroke, hyperlipidemia, hypertension, and dementia.  He presented with right leg numbness and weakness that has resolved.  MRI revealed multifocal infarcts in the cerebellum as well as bilateral cerebral hemispheres.    Clinical Impression   This 84 y/o male presents with the above. Per chart review and pt report, pt was independent with ADLs and functional mobility at his baseline, lives with spouse. Pt completing room and hallway level functional mobility, standing grooming ADLs with minguard assist this session without AD, currently requires MinA for LB ADLs. Pt with baseline dementia, is able to complete one step commands consistently this session and completing functional tasks with min verbal cues. Feel Pt is mostly at his baseline regarding ADL and mobility completion (no family present at time of session), will continue to follow to maximize his safety and independence with ADLs and mobility and to ensure safe d/c home.     Follow Up Recommendations  No OT follow up;Supervision/Assistance - 24 hour    Equipment Recommendations  None recommended by OT           Precautions / Restrictions Precautions Precautions: Fall Restrictions Weight Bearing Restrictions: No      Mobility Bed Mobility Overal bed mobility: Modified Independent                Transfers Overall transfer level: Needs assistance Equipment used: None Transfers: Sit to/from Stand Sit to Stand: Supervision         General transfer comment: supervision for safety, no physical assist    Balance Overall balance assessment: Mild deficits observed, not formally tested                                         ADL either performed or assessed with clinical judgement   ADL Overall  ADL's : Needs assistance/impaired Eating/Feeding: Supervision/ safety;Sitting   Grooming: Wash/dry face;Wash/dry hands;Oral care;Supervision/safety;Standing   Upper Body Bathing: Min guard;Sitting   Lower Body Bathing: Min guard;Sit to/from stand   Upper Body Dressing : Set up;Sitting   Lower Body Dressing: Minimal assistance;Sit to/from stand Lower Body Dressing Details (indicate cue type and reason): assist to fully don socks, pt donning non-slip socks over regular socks which added to difficulty, increased effort to bring RLE towards body to don  Toilet Transfer: Min guard;Ambulation;Regular Glass blower/designer Details (indicate cue type and reason): simulated in transfer to/from [place] and Hygiene: Min guard;Sit to/from stand       Functional mobility during ADLs: Min guard;Supervision/safety       Vision Baseline Vision/History: Wears glasses Wears Glasses: Reading only Patient Visual Report: No change from baseline Vision Assessment?: No apparent visual deficits                Pertinent Vitals/Pain Pain Assessment: No/denies pain     Hand Dominance Right   Extremity/Trunk Assessment Upper Extremity Assessment Upper Extremity Assessment: Overall WFL for tasks assessed   Lower Extremity Assessment Lower Extremity Assessment: Defer to PT evaluation   Cervical / Trunk Assessment Cervical / Trunk Assessment: Kyphotic   Communication Communication Communication: No difficulties   Cognition Arousal/Alertness: Awake/alert Behavior During Therapy: WFL for tasks assessed/performed Overall Cognitive Status: History of cognitive impairments -  at baseline                                 General Comments: pt oriented x2 this session; has baseline dementia, suspect he may be at his baseline for cognition                      Home Living Family/patient expects to be discharged to:: Private residence Living  Arrangements: Spouse/significant other Available Help at Discharge: Family;Available 24 hours/day Type of Home: House Home Access: Stairs to enter [employer] of Steps: 3 Entrance Stairs-Rails: Right Home Layout: One level     Bathroom Shower/Tub: Occupational psychologist: Standard     Home Equipment: Environmental consultant - 2 wheels   Additional Comments: PLOF and home set up obtained from chart review due to pt's baseline cognitive deficits   Lives With: Spouse    Prior Functioning/Environment Level of Independence: Independent        Comments: independent per pt. h/o dementia and previous stroke so unsure of validity.        OT Problem List: Decreased strength;Impaired balance (sitting and/or standing);Decreased cognition;Decreased activity tolerance      OT Treatment/Interventions: Self-care/ADL training;DME and/or AE instruction;Balance training;Therapeutic activities;Therapeutic exercise;Patient/family education    OT Goals(Current goals can be found in the care plan section) Acute Rehab OT Goals Patient Stated Goal: not stated OT Goal Formulation: With patient Time For Goal Achievement: 07/31/17 Potential to Achieve Goals: Good  OT Frequency: Min 2X/week                             AM-PAC PT "6 Clicks" Daily Activity     Outcome Measure Help from another person eating meals?: None Help from another person taking care of personal grooming?: None Help from another person toileting, which includes using toliet, bedpan, or urinal?: A Little Help from another person bathing (including washing, rinsing, drying)?: A Little Help from another person to put on and taking off regular upper body clothing?: None Help from another person to put on and taking off regular lower body clothing?: A Little 6 Click Score: 21   End of Session Equipment Utilized During Treatment: Gait belt Nurse Communication: Mobility status  Activity Tolerance: Patient  tolerated treatment well Patient left: in bed;with call bell/phone within reach;with bed alarm set  OT Visit Diagnosis: Other symptoms and signs involving the nervous system (R29.898)                Time: 8882-8003 OT Time Calculation (min): 17 min Charges:  OT General Charges $OT Visit: 1 Visit OT Evaluation $OT Eval Moderate Complexity: 1 Mod G-Codes:     Lou Cal, OT Pager [phone] 07/17/2017   Lance Ramirez 07/17/2017, 3:50 PM

## 2017-07-17 NOTE — Progress Notes (Signed)
*  PRELIMINARY RESULTS* Vascular Ultrasound Carotid Duplex (Doppler) has been completed.  Findings suggest 1-39% internal carotid artery stenosis bilaterally. Vertebral arteries are patent with antegrade flow.  Bilateral lower extremity venous duplex completed. Bilateral lower extremities are negative for deep vein thrombosis. There is no evidence of Baker's cyst bilaterally.  07/17/2017 9:17 AM Maudry Mayhew, BS, RVT, RDCS, RDMS

## 2017-07-17 NOTE — Evaluation (Signed)
Physical Therapy Evaluation Patient Details Name: Lance Ramirez MRN: [id number] DOB: [birth year] Today's Date: 07/17/2017   History of Present Illness  Pt is an 84 y.o. male with past medical history of stroke, hyperlipidemia, hypertension, and dementia.  He presented with right leg numbness and weakness that has resolved.  MRI revealed multifocal infarcts in the cerebellum as well as bilateral cerebral hemispheres.     Clinical Impression  Pt admitted with above diagnosis. Pt currently with functional limitations due to the deficits listed below (see PT Problem List). On eval, pt required supervision transfers, min guard assist ambulation 300 feet without AD, and min guard assist ascend/descend 5 steps with R rail.  Pt will benefit from skilled PT to increase their independence and safety with mobility to allow discharge to the venue listed below.  I suspect pt is close to baseline. PT to follow acutely to ensure safe transition home. No follow up services indicated.      Follow Up Recommendations No PT follow up;Supervision - Intermittent    Equipment Recommendations  None recommended by PT    Recommendations for Other Services       Precautions / Restrictions Precautions Precautions: Fall      Mobility  Bed Mobility Overal bed mobility: Modified Independent             General bed mobility comments: +rail  Transfers Overall transfer level: Needs assistance Equipment used: None Transfers: Sit to/from Stand Sit to Stand: Supervision         General transfer comment: supervision for safety, no physical assist  Ambulation/Gait Ambulation/Gait assistance: Min guard Ambulation Distance (Feet): 300 Feet Assistive device: None Gait Pattern/deviations: Step-through pattern;Drifts right/left Gait velocity: WFL Gait velocity interpretation: >2.62 ft/sec, indicative of community ambulatory General Gait Details: min guard for safety, mildly unsteady but no overt  LOB  Stairs Stairs: Yes Stairs assistance: Min guard Stair Management: One rail Right;Alternating pattern Number of Stairs: 5    Wheelchair Mobility    Modified Rankin (Stroke Patients Only) Modified Rankin (Stroke Patients Only) Pre-Morbid Rankin Score: Slight disability Modified Rankin: Slight disability     Balance Overall balance assessment: Mild deficits observed, not formally tested                                           Pertinent Vitals/Pain Pain Assessment: No/denies pain    Home Living Family/patient expects to be discharged to:: Private residence Living Arrangements: Spouse/significant other Available Help at Discharge: Family Type of Home: House Home Access: Stairs to enter Entrance Stairs-Rails: Right Entrance Stairs-Number of Steps: 3 Home Layout: One level Home Equipment: Walker - 2 wheels Additional Comments: Pt able to provide accurate home set up after comparing information from previous admission.    Prior Function Level of Independence: Independent         Comments: independent per pt. h/o dementia and previous stroke so unsure of validity.     Hand Dominance   Dominant Hand: Right    Extremity/Trunk Assessment   Upper Extremity Assessment Upper Extremity Assessment: Defer to OT evaluation    Lower Extremity Assessment Lower Extremity Assessment: Overall WFL for tasks assessed(5/5 bilat)    Cervical / Trunk Assessment Cervical / Trunk Assessment: Kyphotic  Communication   Communication: No difficulties  Cognition Arousal/Alertness: Awake/alert Behavior During Therapy: WFL for tasks assessed/performed Overall Cognitive Status: No family/caregiver present to determine baseline cognitive functioning  General Comments: Supsect pt is at baseline for cognition. In ED wife stated, 'he can't remember anything." A&O x 2. Disoriented to time and situation.       General  Comments      Exercises     Assessment/Plan    PT Assessment Patient needs continued PT services  PT Problem List Decreased mobility;Decreased safety awareness;Decreased cognition;Decreased balance       PT Treatment Interventions Therapeutic activities;Cognitive remediation;Gait training;Therapeutic exercise;Patient/family education;Stair training;Balance training;Functional mobility training    PT Goals (Current goals can be found in the Care Plan section)  Acute Rehab PT Goals Patient Stated Goal: not stated PT Goal Formulation: With patient Time For Goal Achievement: 07/31/17 Potential to Achieve Goals: Good    Frequency Min 3X/week   Barriers to discharge        Co-evaluation               AM-PAC PT "6 Clicks" Daily Activity  Outcome Measure Difficulty turning over in bed (including adjusting bedclothes, sheets and blankets)?: None Difficulty moving from lying on back to sitting on the side of the bed? : None Difficulty sitting down on and standing up from a chair with arms (e.g., wheelchair, bedside commode, etc,.)?: None Help needed moving to and from a bed to chair (including a wheelchair)?: None Help needed walking in hospital room?: A Little Help needed climbing 3-5 steps with a railing? : A Little 6 Click Score: 22    End of Session Equipment Utilized During Treatment: Gait belt Activity Tolerance: Patient tolerated treatment well Patient left: in bed;with call bell/phone within reach;with bed alarm set Nurse Communication: Mobility status PT Visit Diagnosis: Unsteadiness on feet (R26.81)    Time: 1572-6203 PT Time Calculation (min) (ACUTE ONLY): 16 min   Charges:   PT Evaluation $PT Eval Low Complexity: 1 Low     PT G Codes:        Lorrin Goodell, PT  Office # [phone] Pager [phone]   Lorriane Shire 07/17/2017, 11:16 AM
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
## 2017-07-17 NOTE — Plan of Care (Signed)
  Problem: Self-Care: Goal: Ability to participate in self-care as condition permits will improve Outcome: Progressing   

## 2017-07-17 NOTE — Progress Notes (Signed)
OT Cancellation Note  Patient Details Name: Lance Ramirez MRN: [id number] DOB: 20-Jul-1933   Cancelled Treatment:    Reason Eval/Treat Not Completed: Patient at procedure or test/ unavailable; will follow up as schedule allows and as pt is available.   Lou Cal, OT Pager [phone] 07/17/2017   [name] 07/17/2017, 8:57 AM

## 2017-07-17 NOTE — Progress Notes (Addendum)
STROKE TEAM PROGRESS NOTE   SUBJECTIVE (INTERVAL HISTORY) No family members present. The patient is lying in bed in no distress. He knows he is in the hospital but was unable to tell me what brought him here. He is pleasantly, mildly confused. He states he feels fine.   OBJECTIVE Temp:  [98 F (36.7 C)-98.7 F (37.1 C)] 98.7 F (37.1 C) (05/10 1953) Pulse Rate:  [47-78] 70 (05/10 1953) Cardiac Rhythm: Normal sinus rhythm (05/10 2048) Resp:  [16-20] 18 (05/10 1953) BP: (111-155)/(59-71) 155/71 (05/10 1953) SpO2:  [93 %-99 %] 97 % (05/10 1953) Weight:  [195 lb 8.8 oz (88.7 kg)-196 lb 10.4 oz (89.2 kg)] 195 lb 8.8 oz (88.7 kg) (05/10 0152)  CBC:  Recent Labs  Lab 07/16/17 1800  07/17/17 0103 07/17/17 0449  WBC 4.5  --  4.3 4.8  NEUTROABS 2.9  --   --   --   HGB 13.4   < > 12.0* 12.1*  HCT 40.4   < > 35.5* 36.7*  MCV 85.8  --  85.3 85.2  PLT 186  --  168 175   < > = values in this interval not displayed.    Basic Metabolic Panel:  Recent Labs  Lab 07/16/17 1800 07/16/17 1809 07/17/17 0103 07/17/17 0449  NA 142 142  --  143  K 3.8 3.9  --  3.5  CL 108 105  --  108  CO2 25  --   --  25  GLUCOSE 148* 144*  --  106*  BUN 20 21*  --  21*  CREATININE 1.55* 1.60* 1.61* 1.63*  CALCIUM 9.0  --   --  8.6*    Lipid Panel:     Component Value Date/Time   CHOL 154 07/17/2017 0103   TRIG 101 07/17/2017 0103   HDL 29 (L) 07/17/2017 0103   CHOLHDL 5.3 07/17/2017 0103   VLDL 20 07/17/2017 0103   LDLCALC 105 (H) 07/17/2017 0103   LDLCALC 178 (H) 02/10/2017 1453   HgbA1c:  Lab Results  Component Value Date   HGBA1C 5.7 (H) 07/17/2017   Urine Drug Screen:     Component Value Date/Time   LABOPIA NONE DETECTED 07/14/2016 1403   COCAINSCRNUR NONE DETECTED 07/14/2016 1403   LABBENZ POSITIVE (A) 07/14/2016 1403   AMPHETMU NONE DETECTED 07/14/2016 1403   THCU NONE DETECTED 07/14/2016 1403   LABBARB NONE DETECTED 07/14/2016 1403    Alcohol Level     Component Value  Date/Time   ETH <5 07/14/2016 1419    IMAGING I have personally reviewed the radiological images below and agree with the radiology interpretations.  Ct Head Wo Contrast 07/16/2017 IMPRESSION:  Atrophy and small vessel disease.  No acute intracranial findings. Some progression of cerebral volume loss compared to 2017.    Mr Jodene Nam Head Wo Contrast 07/17/2017 IMPRESSION:   MRI HEAD IMPRESSION  1. Multiple small predominantly subcentimeter multifocal infarcts involving the bilateral cerebral and right cerebellar hemispheres as above, largest of which measures 10 mm at the mesial left temporal lobe. A central thromboembolic etiology is suspected. No associated hemorrhage.  2. Remote right PCA territory infarct, with additional scatter remote lacunar infarcts involving the bilateral basal ganglia, thalami, and bilateral cerebellar hemispheres.  3. Age-related cerebral atrophy with advanced chronic small vessel ischemic disease.   MRA HEAD IMPRESSION  1. Severe atheromatous change throughout the intracranial circulation as above. Most notable findings include severe stenoses at the proximal left M1 segment, right V4 segment, and mid basilar  artery.  2. Nonvisualization of the left PCA, likely occluded.    Dg Chest Port 1 View 07/16/2017 IMPRESSION:  No active disease.  Stable cardiomegaly.  Aortic atherosclerosis.    Transthoracic Echocardiogram - Comapred to a prior study in 2014, there is now severe LVH (IVSd   of 2.0 cm), noraml wall motion, grade 1 DD and markedly elevated   LV filling pressure. 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  Bilateral Carotid Dopplers  07/17/2017 1-39% internal carotid artery stenosis bilaterally. Vertebral arteries are patent with antegrade flow.  Bilateral lower extremity Dopplers - negative for DVT    PHYSICAL EXAM Vitals:   07/17/17 0739 07/17/17 1331 07/17/17 1605 07/17/17 1953  BP: (!) 133/59  138/68 134/68 (!) 155/71  Pulse: 67 64 68 70  Resp: 16 16 16 18   Temp: 98.1 F (36.7 C) 98 F (36.7 C) 98.1 F (36.7 C) 98.7 F (37.1 C)  TempSrc: Oral Oral Oral Oral  SpO2: 93% 97% 97% 97%  Weight:      Height:        General - pleasant mildly confused 84 year old min no acute  Heart - Regular rate and rhythm - no murmer appreciated Lungs - Clear to auscultation anteriorly Abdomen - Soft - non tender Extremities - Distal pulses weak to absent. 1+ edema bilaterally. Skin - Warm and dry  Mental Status: Oriented to person, May, and [place]. Was unable to name the current president, current year, day of the week, reason for hospital etc. Speech fluent without evidence of aphasia.  Able to follow 3 step commands without difficulty. Cranial Nerves: II: Discs not visualized; Visual fields grossly normal, pupils equal, round, reactive to light. III,IV, VI: ptosis not present, extra-ocular motions intact bilaterally V,VII: smile symmetric, facial light touch sensation normal bilaterally VIII: hearing normal bilaterally IX,X: gag reflex present XI: bilateral shoulder shrug intact. XII: midline tongue extension Motor: RUE - 5/5    LUE - 5/5 RLE - 5/5    LLE -  5/5 Tone and bulk:normal tone throughout; no atrophy noted Sensory: Light touch intact throughout, bilaterally Cerebellar: normal finger-to-nose, normal rapid alternating movements and normal heel-to-shin test Gait: not tested    ASSESSMENT/PLAN Mr. Dorin Stooksbury is a 84 y.o. male with history of hypertension, hyperlipidemia, previous strokes, and dementia presenting with transitory right-sided weakness. He did not receive IV t-PA due to resolution of deficits.  Strokes: multifocal cortical and subcortical puntate infarcts - embolic - etiology unclear.  Resultant  minimal if any deficits - right-sided weakness resolved  CT head - Atrophy and small vessel disease.   MRI head - multifocal punctate infarcts including  right cerebellar, left mesial temporal, b/l frontal cortical, and right parafalcin. Remote left thalamic infarcts.  MRA head - severe intracranial stenoses at the proximal left M1 segment, right V4 segment, and mid basilar artery. Left PCA likely occluded.  Carotid Doppler unremarkable.  Bilateral lower extremity Dopplers - negative for DVT.  Recommend 30 day cardiac event monitoring to rule out afib as outpt  2D Echo -EF 60-65%  LDL - 105  HgbA1c - 5.7  VTE prophylaxis - Lovenox  aspirin 81 mg daily and clopidogrel 75 mg daily prior to admission, now on aspirin 325 mg daily and clopidogrel 75 mg daily. Continue DAPT on discharge for 3 months and then plavix alone.   Ongoing aggressive stroke risk factor management  Therapy recommendations:  pending  Disposition:  Pending  Hx of stroke  02/12/11 - left thalamic infarct - MRA left PCA occluded with diffuse athero - MRA neck neg, CUS neg, A1C 6.0 nad LDL 87  05/2012 right PCA infarct - MRA right PCA occluded - CUS neg - put on DAPT  Hypertension  Stable . Permissive hypertension (OK if < 220/120) but gradually normalize in 5-7 days . Long-term BP goal normotensive  Hyperlipidemia  Lipid lowering medication PTA:  none  LDL 105, goal < 70  Current lipid lowering medication: now on Lipitor 80 mg daily  Continue statin at discharge  Vascular dementia  Not fully orientated  Likely not a good candidate for loop recorder  Followed by Dr. Tomi Likens  Other Stroke Risk Factors  Advanced age  Former cigarette smoker - quit 30 to 40 years ago.  Other [facility] day # 0  Neurology will sign off. Please call with questions. Pt will follow up with Dr. Tomi Likens at [facility] in about 4 weeks. Thanks for the consult.  Rosalin Hawking, MD PhD Stroke Neurology 07/17/2017 11:48 PM    To contact Stroke Continuity provider, please refer to [email or web address] After hours, contact General Neurology

## 2017-07-17 NOTE — Progress Notes (Signed)
PROGRESS NOTE    Lance Ramirez  UXL:[id number] DOB: 10/27/1933 DOA: 07/16/2017 PCP: Unk Pinto, MD    Brief Narrative:  Lance Ramirez is a 84 y.o. male with history of stroke with progressive memory loss since the nostril I recommend TPA in 2012, chronic kidney disease stage III, hypertension was brought to the ER after patient's wife noticed that patient has been having transient weakness of the right lower extremity since morning.-At least 5 times when patient was trying to walk.  No weakness of the upper extremities or left lower extremity.  Patient does notice that patient has been having increasing swelling of the both lower extremity.  Patient has eyes denies any difficulty breathing or any chest pain.  ED Course: In the ER patient appeared nonfocal.  CT head was unremarkable.  Patient is being admitted for further stroke work-up.  Neurologist on-call has been consulted.  Patient passed swallow.  Since patient's lower extremities mass: BNP was done which was mildly elevated along with troponin.  The ER physician had ordered 1 dose of Lasix 20 mg IV.     Assessment & Plan:   Principal Problem:   TIA (transient ischemic attack) Active Problems:   Hypertension   History of CVA (cerebrovascular accident)   Depression, major, recurrent, in partial remission ([place])   Benign prostatic hyperplasia with lower urinary tract symptoms   CKD (chronic kidney disease) stage 3, GFR 30-59 ml/min (HCC)  1-Acute multiple stroke; bilateral cerebellar and cerebral.  -MRI brain; Multiple small predominantly subcentimeter multifocal infarcts involving the bilateral cerebral and right cerebellar hemispheres as above, largest of which measures 10 mm at the mesial left temporal lobe. A central thromboembolic etiology is suspected. No associated hemorrhage. -ECHO; Left ventricle: The cavity size was normal. There was severe concentric hypertrophy. Systolic function was normal. The estimated ejection  fraction was in the range of 60% to 65%. Wall motion was normal; there were no regional wall motion abnormalities. Doppler parameters are consistent with abnormal left ventricular relaxation (grade 1 diastolic dysfunction). The -Hb A1c; 5.7, LDL 105.  -doppler; no significant stenosis.  Doppler LE; negative for DVT>  Follow neuro recommendation for anticoagulation.  On aspirin and plavix.  Needs 30 day monitor.   2-CKD stage III; cr at baseline. 1.6.   3-Elevation of troponi. Mild. ECHO no wall motion abnormalities.   4-Acute on chronic Diastolic Dysfunction. LE edema; resolved.  Will need repeat Bmet in am.  Will need to decide if he will be discharge on lasix, instead of HCTZ>   HTN; hold arb due to CKD. Continue bisoprolol.     DVT prophylaxis: lovenox Code Status: full code.  Family Communication: care discussed with patient.  Disposition Plan: to be determine   Consultants:   Neurology    Procedures:  ECHO;    Antimicrobials: none   Subjective: He is alert, he feels stable. Denies legs weakness.   Objective: Vitals:   07/17/17 0500 07/17/17 0700 07/17/17 0739 07/17/17 1331  BP: 131/64 128/61 (!) 133/59 138/68  Pulse: 67 68 67 64  Resp: 18 18 16 16   Temp: 98.5 F (36.9 C) 98.6 F (37 C) 98.1 F (36.7 C) 98 F (36.7 C)  TempSrc: Oral Oral Oral Oral  SpO2: 98% 97% 93% 97%  Weight:      Height:       No intake or output data in the 24 hours ending 07/17/17 1517 Filed Weights   07/16/17 1748 07/17/17 0000 07/17/17 0152  Weight: 89.8 kg (198 lb)  89.2 kg (196 lb 10.4 oz) 88.7 kg (195 lb 8.8 oz)    Examination:  General exam: Appears calm and comfortable  Respiratory system: Clear to auscultation. Respiratory effort normal. Cardiovascular system: S1 & S2 heard, RRR. No JVD, murmurs, rubs, gallops or clicks. No pedal edema. Gastrointestinal system: Abdomen is nondistended, soft and nontender. No organomegaly or masses felt. Normal bowel sounds  heard. Central nervous system: Alert and oriented. No focal neurological deficits. Extremities: Symmetric 5 x 5 power. Skin: No rashes, lesions or ulcers   Data Reviewed: I have personally reviewed following labs and imaging studies  CBC: Recent Labs  Lab 07/16/17 1800 07/16/17 1809 07/17/17 0103 07/17/17 0449  WBC 4.5  --  4.3 4.8  NEUTROABS 2.9  --   --   --   HGB 13.4 12.9* 12.0* 12.1*  HCT 40.4 38.0* 35.5* 36.7*  MCV 85.8  --  85.3 85.2  PLT 186  --  168 517   Basic Metabolic Panel: Recent Labs  Lab 07/16/17 1800 07/16/17 1809 07/17/17 0103 07/17/17 0449  NA 142 142  --  143  K 3.8 3.9  --  3.5  CL 108 105  --  108  CO2 25  --   --  25  GLUCOSE 148* 144*  --  106*  BUN 20 21*  --  21*  CREATININE 1.55* 1.60* 1.61* 1.63*  CALCIUM 9.0  --   --  8.6*   GFR: Estimated Creatinine Clearance: 37.9 mL/min (A) (by C-G formula based on SCr of 1.63 mg/dL (H)). Liver Function Tests: Recent Labs  Lab 07/16/17 1800 07/17/17 0449  AST 13* 14*  ALT 11* 12*  ALKPHOS 50 45  BILITOT 0.8 0.5  PROT 5.9* 5.3*  ALBUMIN 3.3* 3.0*   No results for input(s): LIPASE, AMYLASE in the last 168 hours. No results for input(s): AMMONIA in the last 168 hours. Coagulation Profile: Recent Labs  Lab 07/16/17 1800  INR 1.09   Cardiac Enzymes: Recent Labs  Lab 07/17/17 0103 07/17/17 0449 07/17/17 1036  TROPONINI 0.33* 0.37* 0.37*   BNP (last 3 results) No results for input(s): PROBNP in the last 8760 hours. HbA1C: Recent Labs    07/17/17 0103  HGBA1C 5.7*   CBG: Recent Labs  Lab 07/16/17 1850  GLUCAP 123*   Lipid Profile: Recent Labs    07/17/17 0103  CHOL 154  HDL 29*  LDLCALC 105*  TRIG 101  CHOLHDL 5.3   Thyroid Function Tests: No results for input(s): TSH, T4TOTAL, FREET4, T3FREE, THYROIDAB in the last 72 hours. Anemia Panel: No results for input(s): VITAMINB12, FOLATE, FERRITIN, TIBC, IRON, RETICCTPCT in the last 72 hours. Sepsis Labs: No results for  input(s): PROCALCITON, LATICACIDVEN in the last 168 hours.  No results found for this or any previous visit (from the past 240 hour(s)).       Radiology Studies: Ct Head Wo Contrast  Result Date: 07/16/2017 CLINICAL DATA:  RIGHT leg numbness and weakness. Cannot remember when symptoms started or if they are improved. Memory deficit. Poor historian. EXAM: CT HEAD WITHOUT CONTRAST TECHNIQUE: Contiguous axial images were obtained from the base of the skull through the vertex without intravenous contrast. COMPARISON:  CT head 09/21/2015.  Most recent MR head 09/04/2012. FINDINGS: Brain: No evidence for acute infarction, hemorrhage, mass lesion, hydrocephalus, or extra-axial fluid. Generalized atrophy. Hypoattenuation of white matter, likely small vessel disease. Remote lacunar infarcts, most notable LEFT thalamus/internal capsule. Also chronic RIGHT PCA territory infarct affecting posterior temporal and occipital regions.  Vascular: Calcification of the cavernous internal carotid arteries and distal vertebral arteries consistent with cerebrovascular atherosclerotic disease. No signs of intracranial large vessel occlusion. Skull: Negative Sinuses/Orbits: No layering sinus fluid or significant opacity. BILATERAL cataract extraction. Other: None. Compared with priors, there is worsening atrophy/increasing cerebral hemispheric volume loss, and ventriculomegaly. IMPRESSION: Atrophy and small vessel disease.  No acute intracranial findings. Some progression of cerebral volume loss compared to 2017. Electronically Signed   By: Staci Righter M.D.   On: 07/16/2017 21:08   Mr Brain Wo Contrast  Result Date: 07/17/2017 CLINICAL DATA:  Initial evaluation for acute right leg weakness. EXAM: MRI HEAD WITHOUT CONTRAST MRA HEAD WITHOUT CONTRAST TECHNIQUE: Multiplanar, multiecho pulse sequences of the brain and surrounding structures were obtained without intravenous contrast. Angiographic images of the head were obtained  using MRA technique without contrast. COMPARISON:  Prior CT from earlier the same day as well as previous MRI from 05/15/2012. FINDINGS: MRI HEAD FINDINGS Brain: Generalized age-related cerebral atrophy. Patchy and confluent T2/FLAIR hyperintensity within the periventricular and deep white matter both cerebral hemispheres, consistent with chronic small vessel ischemic disease, advanced in nature. Superimposed remote lacunar infarcts present within the bilateral basal ganglia and thalami. Remote right PCA territory infarct. Additional scattered remote bilateral cerebellar infarcts. 10 mm acute ischemic nonhemorrhagic infarct within the periventricular white matter of the mesial left temporal lobe (series 5001, image 63). Few additional punctate cortical infarcts seen involving both cerebral hemispheres (series 5001, images 70 for, 75, 78). A few additional tiny right cerebellar infarcts noted as well (series 5001, image 57, 53). No associated hemorrhage. Findings likely reflect sequelae of a central thromboembolic phenomenon. No acute intracranial hemorrhage. Subcentimeter chronic microhemorrhage noted at the posterior left centrum semi ovale. No mass lesion, midline shift or mass effect. No hydrocephalus. No extra-axial fluid collection. Major dural sinuses are grossly patent. Pituitary gland suprasellar region normal. Midline structures intact and normal. Vascular: Signal abnormality within the right V4 segment consistent with atherosclerotic change (series 14001, image 1). Major intracranial vascular flow voids otherwise maintained. Skull and upper cervical spine: Calvarium intact. Scalp soft tissues normal. Craniocervical junction normal. Postsurgical changes from prior ACDF noted at C3-4. Bone marrow signal intensity normal. Sinuses/Orbits: Globes and orbital soft tissues within normal limits. Patient status post cataract extraction bilaterally. Paranasal sinuses are clear. No significant mastoid effusion. Inner  ear structures normal. Other: None. MRA HEAD FINDINGS Distal cervical segments of the internal carotid arteries are patent with antegrade flow. Petrous segments patent bilaterally. Extensive atheromatous irregularity throughout the cavernous/supraclinoid segments with secondary mild to moderate multifocal narrowing, most notable at the supraclinoid right ICA. Left A1 segment patent. Severe diffuse narrowing of the right A1 segment. Normal anterior communicating artery. Extensive atheromatous change throughout the anterior cerebral arteries with multifocal moderate stenoses, worse on the left. Focal severe proximal left M1 stenosis (series 7001, image 111). Left M1 irregular but patent distally. Diffuse atheromatous irregularity with mild diffuse narrowing at the right M1 segment. Extensive atheromatous change throughout the distal MCA branches bilaterally which are well perfused and fairly symmetric in nature. POSTERIOR CIRCULATION: Atheromatous change with multifocal moderate stenoses throughout the bilateral V4 segments. There is a more focal severe right V4 segment beyond the takeoff of the right PICA (series 7001, image 30). Posterior inferior cerebral arteries are patent bilaterally. Extensive atheromatous change throughout the basilar artery. Focal fairly severe stenosis present at the mid basilar artery. Superior cerebral arteries are patent bilaterally. Severe bilateral P1 stenoses. Left PCA not visualized, likely occluded. Moderate  right P2 stenosis. Right PCA is patent to its distal aspect. No aneurysm. IMPRESSION: MRI HEAD IMPRESSION 1. Multiple small predominantly subcentimeter multifocal infarcts involving the bilateral cerebral and right cerebellar hemispheres as above, largest of which measures 10 mm at the mesial left temporal lobe. A central thromboembolic etiology is suspected. No associated hemorrhage. 2. Remote right PCA territory infarct, with additional scatter remote lacunar infarcts involving  the bilateral basal ganglia, thalami, and bilateral cerebellar hemispheres. 3. Age-related cerebral atrophy with advanced chronic small vessel ischemic disease. MRA HEAD IMPRESSION 1. Severe atheromatous change throughout the intracranial circulation as above. Most notable findings include severe stenoses at the proximal left M1 segment, right V4 segment, and mid basilar artery. 2. Nonvisualization of the left PCA, likely occluded. Electronically Signed   By: Jeannine Boga M.D.   On: 07/17/2017 00:59   Dg Chest Port 1 View  Result Date: 07/16/2017 CLINICAL DATA:  Possible stroke EXAM: PORTABLE CHEST 1 VIEW COMPARISON:  Chest x-ray dated 04/08/2013. FINDINGS: Stable cardiomegaly. Lungs are clear. No pleural effusion or pneumothorax seen. Aortic atherosclerosis. No acute or suspicious osseous finding. IMPRESSION: No active disease.  Stable cardiomegaly.  Aortic atherosclerosis. Electronically Signed   By: Franki Cabot M.D.   On: 07/16/2017 20:25   Mr Jodene Nam Head Wo Contrast  Result Date: 07/17/2017 CLINICAL DATA:  Initial evaluation for acute right leg weakness. EXAM: MRI HEAD WITHOUT CONTRAST MRA HEAD WITHOUT CONTRAST TECHNIQUE: Multiplanar, multiecho pulse sequences of the brain and surrounding structures were obtained without intravenous contrast. Angiographic images of the head were obtained using MRA technique without contrast. COMPARISON:  Prior CT from earlier the same day as well as previous MRI from 05/15/2012. FINDINGS: MRI HEAD FINDINGS Brain: Generalized age-related cerebral atrophy. Patchy and confluent T2/FLAIR hyperintensity within the periventricular and deep white matter both cerebral hemispheres, consistent with chronic small vessel ischemic disease, advanced in nature. Superimposed remote lacunar infarcts present within the bilateral basal ganglia and thalami. Remote right PCA territory infarct. Additional scattered remote bilateral cerebellar infarcts. 10 mm acute ischemic  nonhemorrhagic infarct within the periventricular white matter of the mesial left temporal lobe (series 5001, image 63). Few additional punctate cortical infarcts seen involving both cerebral hemispheres (series 5001, images 70 for, 75, 78). A few additional tiny right cerebellar infarcts noted as well (series 5001, image 57, 53). No associated hemorrhage. Findings likely reflect sequelae of a central thromboembolic phenomenon. No acute intracranial hemorrhage. Subcentimeter chronic microhemorrhage noted at the posterior left centrum semi ovale. No mass lesion, midline shift or mass effect. No hydrocephalus. No extra-axial fluid collection. Major dural sinuses are grossly patent. Pituitary gland suprasellar region normal. Midline structures intact and normal. Vascular: Signal abnormality within the right V4 segment consistent with atherosclerotic change (series 14001, image 1). Major intracranial vascular flow voids otherwise maintained. Skull and upper cervical spine: Calvarium intact. Scalp soft tissues normal. Craniocervical junction normal. Postsurgical changes from prior ACDF noted at C3-4. Bone marrow signal intensity normal. Sinuses/Orbits: Globes and orbital soft tissues within normal limits. Patient status post cataract extraction bilaterally. Paranasal sinuses are clear. No significant mastoid effusion. Inner ear structures normal. Other: None. MRA HEAD FINDINGS Distal cervical segments of the internal carotid arteries are patent with antegrade flow. Petrous segments patent bilaterally. Extensive atheromatous irregularity throughout the cavernous/supraclinoid segments with secondary mild to moderate multifocal narrowing, most notable at the supraclinoid right ICA. Left A1 segment patent. Severe diffuse narrowing of the right A1 segment. Normal anterior communicating artery. Extensive atheromatous change throughout the anterior cerebral arteries  with multifocal moderate stenoses, worse on the left. Focal  severe proximal left M1 stenosis (series 7001, image 111). Left M1 irregular but patent distally. Diffuse atheromatous irregularity with mild diffuse narrowing at the right M1 segment. Extensive atheromatous change throughout the distal MCA branches bilaterally which are well perfused and fairly symmetric in nature. POSTERIOR CIRCULATION: Atheromatous change with multifocal moderate stenoses throughout the bilateral V4 segments. There is a more focal severe right V4 segment beyond the takeoff of the right PICA (series 7001, image 30). Posterior inferior cerebral arteries are patent bilaterally. Extensive atheromatous change throughout the basilar artery. Focal fairly severe stenosis present at the mid basilar artery. Superior cerebral arteries are patent bilaterally. Severe bilateral P1 stenoses. Left PCA not visualized, likely occluded. Moderate right P2 stenosis. Right PCA is patent to its distal aspect. No aneurysm. IMPRESSION: MRI HEAD IMPRESSION 1. Multiple small predominantly subcentimeter multifocal infarcts involving the bilateral cerebral and right cerebellar hemispheres as above, largest of which measures 10 mm at the mesial left temporal lobe. A central thromboembolic etiology is suspected. No associated hemorrhage. 2. Remote right PCA territory infarct, with additional scatter remote lacunar infarcts involving the bilateral basal ganglia, thalami, and bilateral cerebellar hemispheres. 3. Age-related cerebral atrophy with advanced chronic small vessel ischemic disease. MRA HEAD IMPRESSION 1. Severe atheromatous change throughout the intracranial circulation as above. Most notable findings include severe stenoses at the proximal left M1 segment, right V4 segment, and mid basilar artery. 2. Nonvisualization of the left PCA, likely occluded. Electronically Signed   By: Jeannine Boga M.D.   On: 07/17/2017 00:59        Scheduled Meds: . aspirin  300 mg Rectal Daily   Or  . aspirin  325 mg Oral  Daily  . atorvastatin  80 mg Oral q1800  . bisoprolol  10 mg Oral Daily  . citalopram  40 mg Oral Daily  . clopidogrel  75 mg Oral Daily  . enoxaparin (LOVENOX) injection  40 mg Subcutaneous Daily  . finasteride  5 mg Oral Daily  . minoxidil  5 mg Oral Daily  . terazosin  2 mg Oral QHS   Continuous Infusions:   LOS: 0 days    Time spent: 35 minutes.     Elmarie Shiley, MD [facility] Pager [phone]  If 7PM-7AM, please contact night-coverage [email or web address] Password [email or web address] 07/17/2017, 3:17 PM
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
## 2017-07-17 NOTE — ED Notes (Signed)
Pt to be moved to the floor when they get back to the room from MRI

## 2017-07-17 NOTE — ED Notes (Signed)
Delay in lab draw,  Pt not in room at this time. 

## 2017-07-17 NOTE — Progress Notes (Signed)
  Echocardiogram 2D Echocardiogram has been performed.  Lance Ramirez 07/17/2017, 9:52 AM

## 2017-07-18 ENCOUNTER — Encounter: Payer: Self-pay | Admitting: Internal Medicine

## 2017-07-18 DIAGNOSIS — I700 Atherosclerosis of aorta: Secondary | ICD-10-CM | POA: Insufficient documentation

## 2017-07-18 LAB — BASIC METABOLIC PANEL
Anion gap: 5 (ref 5–15)
BUN: 23 mg/dL — AB (ref 6–20)
CHLORIDE: 110 mmol/L (ref 101–111)
CO2: 27 mmol/L (ref 22–32)
Calcium: 8.6 mg/dL — ABNORMAL LOW (ref 8.9–10.3)
Creatinine, Ser: 1.43 mg/dL — ABNORMAL HIGH (ref 0.61–1.24)
GFR calc Af Amer: 51 mL/min — ABNORMAL LOW (ref >60)
GFR, EST NON AFRICAN AMERICAN: 44 mL/min — AB (ref >60)
GLUCOSE: 90 mg/dL (ref 65–99)
Potassium: 3.8 mmol/L (ref 3.5–5.1)
SODIUM: 142 mmol/L (ref 135–145)

## 2017-07-18 LAB — MAGNESIUM: Magnesium: 2 mg/dL (ref 1.7–2.4)

## 2017-07-18 MED ORDER — ATORVASTATIN CALCIUM 80 MG PO TABS: 80.0000 mg | ORAL_TABLET | Freq: Every day | ORAL | 0 refills | Status: DC

## 2017-07-18 MED ORDER — FUROSEMIDE 20 MG PO TABS: 20.0000 mg | ORAL_TABLET | Freq: Every day | ORAL | 0 refills | Status: DC

## 2017-07-18 MED ORDER — ASPIRIN 325 MG PO TABS: 325.0000 mg | ORAL_TABLET | Freq: Every day | ORAL | 0 refills | Status: DC

## 2017-07-18 MED ORDER — TRAZODONE HCL 50 MG PO TABS
50.0000 mg | ORAL_TABLET | Freq: Once | ORAL | Status: AC
  Administered 2017-07-18: 50 mg via ORAL
  Filled 2017-07-18: qty 1

## 2017-07-18 MED ORDER — CITALOPRAM HYDROBROMIDE 40 MG PO TABS: 20.0000 mg | ORAL_TABLET | Freq: Every day | ORAL | 1 refills | Status: DC

## 2017-07-18 MED ORDER — BISOPROLOL FUMARATE 10 MG PO TABS: 10.0000 mg | ORAL_TABLET | Freq: Every day | ORAL | 0 refills | Status: DC

## 2017-07-18 MED ORDER — POTASSIUM CHLORIDE ER 10 MEQ PO TBCR: 10.0000 meq | EXTENDED_RELEASE_TABLET | Freq: Every day | ORAL | 0 refills | Status: DC

## 2017-07-18 NOTE — Progress Notes (Addendum)
CM talked to patient about [facility], patient refused stated " I do not want that." CM informed patient that if he changed his mind his primary care physician can make arrangements from his office. Mindi Slicker [facility] [phone]  1:09 pm - CM talked to patient's spouse Clara; pt with stroke and progressive memory loss, she wants a HHRN at discharge; [facility] choices offered, she chose [facility]; Jermane with [facility] called for arrangements. Mindi Slicker RN,MHA,BSN

## 2017-07-18 NOTE — Discharge Summary (Signed)
Physician Discharge Summary  [name] CBS:[id number] DOB: Nov 03, 1933 DOA: 07/16/2017  PCP: Unk Pinto, MD  Admit date: 07/16/2017 Discharge date: 07/18/2017  Admitted From: Home  Disposition:  Home   Recommendations for Outpatient Follow-up:  1. Follow up with PCP in 1-2 weeks 2. Please obtain BMP/CBC in one week 3. Needs Holter for 30 days.  4. Needs B-met to follow renal function. Patient was started on lasix.  5. Follow up with neurology for further work up of stroke.   Home Health: [facility], RN  Discharge Condition: stable.  CODE STATUS: full code.  Diet recommendation: Heart Healthy   Brief/Interim Summary: Brief Narrative: Lance Ramirez a 84 y.o.malewithhistory of stroke with progressive memory loss since the nostril I recommend TPA in 2012, chronic kidney disease stage III, hypertension was brought to the ER after patient's wife noticed that patient has been having transient weakness of the right lower extremity since morning.-At least 5 times when patient was trying to walk. No weakness of the upper extremities or left lower extremity. Patient does notice that patient has been having increasing swelling of the both lower extremity. Patient has eyes denies any difficulty breathing or any chest pain.  ED Course:In the ER patient appeared nonfocal. CT head was unremarkable. Patient is being admitted for further stroke work-up. Neurologist on-call has been consulted. Patient passed swallow. Since patient's lower extremities mass: BNP was done which was mildly elevated along with troponin. The ER physician had ordered 1 dose of Lasix 20 mg IV.     Assessment & Plan:   Principal Problem:   TIA (transient ischemic attack) Active Problems:   Hypertension   History of CVA (cerebrovascular accident)   Depression, major, recurrent, in partial remission ([place])   Benign prostatic hyperplasia with lower urinary tract symptoms   CKD (chronic kidney disease) stage  3, GFR 30-59 ml/min (HCC)  1-Acute multiple stroke; bilateral cerebellar and cerebral.  -MRI brain; Multiple small predominantly subcentimeter multifocal infarcts involving the bilateral cerebral and right cerebellar hemispheres as above, largest of which measures 10 mm at the mesial left temporal lobe. A central thromboembolic etiology is suspected. No associated hemorrhage. -ECHO; Left ventricle: The cavity size was normal. There was severe concentric hypertrophy. Systolic function was normal. The estimated ejection fraction was in the range of 60% to 65%. Wall motion was normal; there were no regional wall motion abnormalities. Doppler parameters are consistent with abnormal left ventricular relaxation (grade 1 diastolic dysfunction).  -Hb A1c; 5.7, LDL 105.  -Doppler; no significant stenosis.  Doppler LE; negative for DVT>  Follow neuro recommendation for anticoagulation.  On aspirin and plavix.  Needs 30 day monitor. Cardiology will arrange.    2-CKD stage III; cr at baseline. 1.6.  cr at 1.4 stable.  ARB discontinue due to CKD. Added Lasix, due to HF. Needs labs for follow up.   3-Elevation of troponi. Mild. ECHO no wall motion abnormalities.   4-Acute on chronic Diastolic Dysfunction. LE edema; resolved.  Cr stable. He will be discharge on low dose lasix. Will stop HCTZ.  needs B-met to follow renal function.   HTN; hold arb due to CKD. Continue bisoprolol.   Disp; home today. Wife updated.   Discharge Diagnoses:  Principal Problem:   TIA (transient ischemic attack) Active Problems:   Hypertension   History of CVA (cerebrovascular accident)   Depression, major, recurrent, in partial remission ([place])   Benign prostatic hyperplasia with lower urinary tract symptoms   CKD (chronic kidney disease) stage 3, GFR 30-59  ml/min [facility])   Stroke (cerebrum) [facility])    Discharge Instructions  Discharge Instructions    Ambulatory referral to Neurology   Complete by:  As  directed    Pt will follow up with Dr. Tomi Likens at [facility] in about 4-6 weeks. Thanks.   Diet - low sodium heart healthy   Complete by:  As directed    Increase activity slowly   Complete by:  As directed      Allergies as of 07/18/2017   No Known Allergies     Medication List    STOP taking these medications   aspirin EC 81 MG tablet Replaced by:  aspirin 325 MG tablet   bisoprolol-hydrochlorothiazide 10-6.25 MG tablet Commonly known as:  ZIAC   losartan 100 MG tablet Commonly known as:  COZAAR   minoxidil 10 MG tablet Commonly known as:  LONITEN     TAKE these medications   ALPRAZolam 1 MG tablet Commonly known as:  XANAX Take 0.5-1 tablets (0.5-1 mg total) by mouth at bedtime as needed. What changed:  when to take this   aspirin 325 MG tablet Take 1 tablet (325 mg total) by mouth daily. Start taking on:  07/19/2017 Replaces:  aspirin EC 81 MG tablet   atorvastatin 80 MG tablet Commonly known as:  LIPITOR Take 1 tablet (80 mg total) by mouth daily at 6 PM.   bisoprolol 10 MG tablet Commonly known as:  ZEBETA Take 1 tablet (10 mg total) by mouth daily. Start taking on:  07/19/2017   citalopram 40 MG tablet Commonly known as:  CELEXA Take 0.5 tablets (20 mg total) by mouth daily. What changed:  how much to take   clopidogrel 75 MG tablet Commonly known as:  PLAVIX Take 1 tablet (75 mg total) by mouth daily.   finasteride 5 MG tablet Commonly known as:  PROSCAR Take 1 tablet (5 mg total) by mouth daily.   furosemide 20 MG tablet Commonly known as:  LASIX Take 1 tablet (20 mg total) by mouth daily.   potassium chloride 10 MEQ tablet Commonly known as:  K-DUR Take 1 tablet (10 mEq total) by mouth daily.   terazosin 2 MG capsule Commonly known as:  HYTRIN TAKE 1 CAPSULE (2 MG TOTAL) BY MOUTH AT BEDTIME.   Vitamin D-3 5000 units Tabs Take 1 tablet by mouth 2 (two) times daily.      Follow-up Information    [name], DO Follow up in 4 week(s).    Specialty:  Neurology Contact information: [address] [place] [address] [phone]          No Known Allergies  Consultations:  Neurology    Procedures/Studies: Ct Head Wo Contrast  Result Date: 07/16/2017 CLINICAL DATA:  RIGHT leg numbness and weakness. Cannot remember when symptoms started or if they are improved. Memory deficit. Poor historian. EXAM: CT HEAD WITHOUT CONTRAST TECHNIQUE: Contiguous axial images were obtained from the base of the skull through the vertex without intravenous contrast. COMPARISON:  CT head 09/21/2015.  Most recent MR head 09/04/2012. FINDINGS: Brain: No evidence for acute infarction, hemorrhage, mass lesion, hydrocephalus, or extra-axial fluid. Generalized atrophy. Hypoattenuation of white matter, likely small vessel disease. Remote lacunar infarcts, most notable LEFT thalamus/internal capsule. Also chronic RIGHT PCA territory infarct affecting posterior temporal and occipital regions. Vascular: Calcification of the cavernous internal carotid arteries and distal vertebral arteries consistent with cerebrovascular atherosclerotic disease. No signs of intracranial large vessel occlusion. Skull: Negative Sinuses/Orbits: No  layering sinus fluid or significant opacity. BILATERAL cataract extraction. Other: None. Compared with priors, there is worsening atrophy/increasing cerebral hemispheric volume loss, and ventriculomegaly. IMPRESSION: Atrophy and small vessel disease.  No acute intracranial findings. Some progression of cerebral volume loss compared to 2017. Electronically Signed   By: Staci Righter M.D.   On: 07/16/2017 21:08   Mr Brain Wo Contrast  Result Date: 07/17/2017 CLINICAL DATA:  Initial evaluation for acute right leg weakness. EXAM: MRI HEAD WITHOUT CONTRAST MRA HEAD WITHOUT CONTRAST TECHNIQUE: Multiplanar, multiecho pulse sequences of the brain and surrounding structures were obtained without intravenous contrast.  Angiographic images of the head were obtained using MRA technique without contrast. COMPARISON:  Prior CT from earlier the same day as well as previous MRI from 05/15/2012. FINDINGS: MRI HEAD FINDINGS Brain: Generalized age-related cerebral atrophy. Patchy and confluent T2/FLAIR hyperintensity within the periventricular and deep white matter both cerebral hemispheres, consistent with chronic small vessel ischemic disease, advanced in nature. Superimposed remote lacunar infarcts present within the bilateral basal ganglia and thalami. Remote right PCA territory infarct. Additional scattered remote bilateral cerebellar infarcts. 10 mm acute ischemic nonhemorrhagic infarct within the periventricular white matter of the mesial left temporal lobe (series 5001, image 63). Few additional punctate cortical infarcts seen involving both cerebral hemispheres (series 5001, images 70 for, 75, 78). A few additional tiny right cerebellar infarcts noted as well (series 5001, image 57, 53). No associated hemorrhage. Findings likely reflect sequelae of a central thromboembolic phenomenon. No acute intracranial hemorrhage. Subcentimeter chronic microhemorrhage noted at the posterior left centrum semi ovale. No mass lesion, midline shift or mass effect. No hydrocephalus. No extra-axial fluid collection. Major dural sinuses are grossly patent. Pituitary gland suprasellar region normal. Midline structures intact and normal. Vascular: Signal abnormality within the right V4 segment consistent with atherosclerotic change (series 14001, image 1). Major intracranial vascular flow voids otherwise maintained. Skull and upper cervical spine: Calvarium intact. Scalp soft tissues normal. Craniocervical junction normal. Postsurgical changes from prior ACDF noted at C3-4. Bone marrow signal intensity normal. Sinuses/Orbits: Globes and orbital soft tissues within normal limits. Patient status post cataract extraction bilaterally. Paranasal sinuses are  clear. No significant mastoid effusion. Inner ear structures normal. Other: None. MRA HEAD FINDINGS Distal cervical segments of the internal carotid arteries are patent with antegrade flow. Petrous segments patent bilaterally. Extensive atheromatous irregularity throughout the cavernous/supraclinoid segments with secondary mild to moderate multifocal narrowing, most notable at the supraclinoid right ICA. Left A1 segment patent. Severe diffuse narrowing of the right A1 segment. Normal anterior communicating artery. Extensive atheromatous change throughout the anterior cerebral arteries with multifocal moderate stenoses, worse on the left. Focal severe proximal left M1 stenosis (series 7001, image 111). Left M1 irregular but patent distally. Diffuse atheromatous irregularity with mild diffuse narrowing at the right M1 segment. Extensive atheromatous change throughout the distal MCA branches bilaterally which are well perfused and fairly symmetric in nature. POSTERIOR CIRCULATION: Atheromatous change with multifocal moderate stenoses throughout the bilateral V4 segments. There is a more focal severe right V4 segment beyond the takeoff of the right PICA (series 7001, image 30). Posterior inferior cerebral arteries are patent bilaterally. Extensive atheromatous change throughout the basilar artery. Focal fairly severe stenosis present at the mid basilar artery. Superior cerebral arteries are patent bilaterally. Severe bilateral P1 stenoses. Left PCA not visualized, likely occluded. Moderate right P2 stenosis. Right PCA is patent to its distal aspect. No aneurysm. IMPRESSION: MRI HEAD IMPRESSION 1. Multiple small predominantly subcentimeter multifocal infarcts involving the bilateral cerebral  and right cerebellar hemispheres as above, largest of which measures 10 mm at the mesial left temporal lobe. A central thromboembolic etiology is suspected. No associated hemorrhage. 2. Remote right PCA territory infarct, with  additional scatter remote lacunar infarcts involving the bilateral basal ganglia, thalami, and bilateral cerebellar hemispheres. 3. Age-related cerebral atrophy with advanced chronic small vessel ischemic disease. MRA HEAD IMPRESSION 1. Severe atheromatous change throughout the intracranial circulation as above. Most notable findings include severe stenoses at the proximal left M1 segment, right V4 segment, and mid basilar artery. 2. Nonvisualization of the left PCA, likely occluded. Electronically Signed   By: Jeannine Boga M.D.   On: 07/17/2017 00:59   Dg Chest Port 1 View  Result Date: 07/16/2017 CLINICAL DATA:  Possible stroke EXAM: PORTABLE CHEST 1 VIEW COMPARISON:  Chest x-ray dated 04/08/2013. FINDINGS: Stable cardiomegaly. Lungs are clear. No pleural effusion or pneumothorax seen. Aortic atherosclerosis. No acute or suspicious osseous finding. IMPRESSION: No active disease.  Stable cardiomegaly.  Aortic atherosclerosis. Electronically Signed   By: Franki Cabot M.D.   On: 07/16/2017 20:25   Mr Jodene Nam Head Wo Contrast  Result Date: 07/17/2017 CLINICAL DATA:  Initial evaluation for acute right leg weakness. EXAM: MRI HEAD WITHOUT CONTRAST MRA HEAD WITHOUT CONTRAST TECHNIQUE: Multiplanar, multiecho pulse sequences of the brain and surrounding structures were obtained without intravenous contrast. Angiographic images of the head were obtained using MRA technique without contrast. COMPARISON:  Prior CT from earlier the same day as well as previous MRI from 05/15/2012. FINDINGS: MRI HEAD FINDINGS Brain: Generalized age-related cerebral atrophy. Patchy and confluent T2/FLAIR hyperintensity within the periventricular and deep white matter both cerebral hemispheres, consistent with chronic small vessel ischemic disease, advanced in nature. Superimposed remote lacunar infarcts present within the bilateral basal ganglia and thalami. Remote right PCA territory infarct. Additional scattered remote bilateral  cerebellar infarcts. 10 mm acute ischemic nonhemorrhagic infarct within the periventricular white matter of the mesial left temporal lobe (series 5001, image 63). Few additional punctate cortical infarcts seen involving both cerebral hemispheres (series 5001, images 70 for, 75, 78). A few additional tiny right cerebellar infarcts noted as well (series 5001, image 57, 53). No associated hemorrhage. Findings likely reflect sequelae of a central thromboembolic phenomenon. No acute intracranial hemorrhage. Subcentimeter chronic microhemorrhage noted at the posterior left centrum semi ovale. No mass lesion, midline shift or mass effect. No hydrocephalus. No extra-axial fluid collection. Major dural sinuses are grossly patent. Pituitary gland suprasellar region normal. Midline structures intact and normal. Vascular: Signal abnormality within the right V4 segment consistent with atherosclerotic change (series 14001, image 1). Major intracranial vascular flow voids otherwise maintained. Skull and upper cervical spine: Calvarium intact. Scalp soft tissues normal. Craniocervical junction normal. Postsurgical changes from prior ACDF noted at C3-4. Bone marrow signal intensity normal. Sinuses/Orbits: Globes and orbital soft tissues within normal limits. Patient status post cataract extraction bilaterally. Paranasal sinuses are clear. No significant mastoid effusion. Inner ear structures normal. Other: None. MRA HEAD FINDINGS Distal cervical segments of the internal carotid arteries are patent with antegrade flow. Petrous segments patent bilaterally. Extensive atheromatous irregularity throughout the cavernous/supraclinoid segments with secondary mild to moderate multifocal narrowing, most notable at the supraclinoid right ICA. Left A1 segment patent. Severe diffuse narrowing of the right A1 segment. Normal anterior communicating artery. Extensive atheromatous change throughout the anterior cerebral arteries with multifocal  moderate stenoses, worse on the left. Focal severe proximal left M1 stenosis (series 7001, image 111). Left M1 irregular but patent distally. Diffuse atheromatous irregularity  with mild diffuse narrowing at the right M1 segment. Extensive atheromatous change throughout the distal MCA branches bilaterally which are well perfused and fairly symmetric in nature. POSTERIOR CIRCULATION: Atheromatous change with multifocal moderate stenoses throughout the bilateral V4 segments. There is a more focal severe right V4 segment beyond the takeoff of the right PICA (series 7001, image 30). Posterior inferior cerebral arteries are patent bilaterally. Extensive atheromatous change throughout the basilar artery. Focal fairly severe stenosis present at the mid basilar artery. Superior cerebral arteries are patent bilaterally. Severe bilateral P1 stenoses. Left PCA not visualized, likely occluded. Moderate right P2 stenosis. Right PCA is patent to its distal aspect. No aneurysm. IMPRESSION: MRI HEAD IMPRESSION 1. Multiple small predominantly subcentimeter multifocal infarcts involving the bilateral cerebral and right cerebellar hemispheres as above, largest of which measures 10 mm at the mesial left temporal lobe. A central thromboembolic etiology is suspected. No associated hemorrhage. 2. Remote right PCA territory infarct, with additional scatter remote lacunar infarcts involving the bilateral basal ganglia, thalami, and bilateral cerebellar hemispheres. 3. Age-related cerebral atrophy with advanced chronic small vessel ischemic disease. MRA HEAD IMPRESSION 1. Severe atheromatous change throughout the intracranial circulation as above. Most notable findings include severe stenoses at the proximal left M1 segment, right V4 segment, and mid basilar artery. 2. Nonvisualization of the left PCA, likely occluded. Electronically Signed   By: Jeannine Boga M.D.   On: 07/17/2017 00:59      Subjective: pleasantly confused.   Denies pain.   Discharge Exam: Vitals:   07/18/17 0350 07/18/17 0744  BP: 139/68 (!) 125/55  Pulse: 69 62  Resp: 18 14  Temp: 97.7 F (36.5 C) (!) 97.5 F (36.4 C)  SpO2: 95% 96%   Vitals:   07/17/17 1953 07/18/17 0000 07/18/17 0350 07/18/17 0744  BP: (!) 155/71 (!) 146/76 139/68 (!) 125/55  Pulse: 70 68 69 62  Resp: 18 18 18 14   Temp: 98.7 F (37.1 C) 98.5 F (36.9 C) 97.7 F (36.5 C) (!) 97.5 F (36.4 C)  TempSrc: Oral Oral Oral Oral  SpO2: 97% 97% 95% 96%  Weight:      Height:        General: Pt is alert, awake, not in acute distress Cardiovascular: RRR, S1/S2 +, no rubs, no gallops Respiratory: CTA bilaterally, no wheezing, no rhonchi Abdominal: Soft, NT, ND, bowel sounds + Extremities: no edema, no cyanosis    The results of significant diagnostics from this hospitalization (including imaging, microbiology, ancillary and laboratory) are listed below for reference.     Microbiology: No results found for this or any previous visit (from the past 240 hour(s)).   Labs: BNP (last 3 results) Recent Labs    07/16/17 1935  BNP 253.6*   Basic Metabolic Panel: Recent Labs  Lab 07/16/17 1800 07/16/17 1809 07/17/17 0103 07/17/17 0449 07/18/17 0338  NA 142 142  --  143 142  K 3.8 3.9  --  3.5 3.8  CL 108 105  --  108 110  CO2 25  --   --  25 27  GLUCOSE 148* 144*  --  106* 90  BUN 20 21*  --  21* 23*  CREATININE 1.55* 1.60* 1.61* 1.63* 1.43*  CALCIUM 9.0  --   --  8.6* 8.6*   Liver Function Tests: Recent Labs  Lab 07/16/17 1800 07/17/17 0449  AST 13* 14*  ALT 11* 12*  ALKPHOS 50 45  BILITOT 0.8 0.5  PROT 5.9* 5.3*  ALBUMIN 3.3* 3.0*  No results for input(s): LIPASE, AMYLASE in the last 168 hours. No results for input(s): AMMONIA in the last 168 hours. CBC: Recent Labs  Lab 07/16/17 1800 07/16/17 1809 07/17/17 0103 07/17/17 0449  WBC 4.5  --  4.3 4.8  NEUTROABS 2.9  --   --   --   HGB 13.4 12.9* 12.0* 12.1*  HCT 40.4 38.0* 35.5*  36.7*  MCV 85.8  --  85.3 85.2  PLT 186  --  168 175   Cardiac Enzymes: Recent Labs  Lab 07/17/17 0103 07/17/17 0449 07/17/17 1036  TROPONINI 0.33* 0.37* 0.37*   BNP: Invalid input(s): POCBNP CBG: Recent Labs  Lab 07/16/17 1850  GLUCAP 123*   D-Dimer No results for input(s): DDIMER in the last 72 hours. Hgb A1c Recent Labs    07/17/17 0103  HGBA1C 5.7*   Lipid Profile Recent Labs    07/17/17 0103  CHOL 154  HDL 29*  LDLCALC 105*  TRIG 101  CHOLHDL 5.3   Thyroid function studies No results for input(s): TSH, T4TOTAL, T3FREE, THYROIDAB in the last 72 hours.  Invalid input(s): FREET3 Anemia work up No results for input(s): VITAMINB12, FOLATE, FERRITIN, TIBC, IRON, RETICCTPCT in the last 72 hours. Urinalysis    Component Value Date/Time   COLORURINE YELLOW 11/03/2016 1518   APPEARANCEUR CLEAR 11/03/2016 1518   LABSPEC 1.017 11/03/2016 1518   PHURINE 6.0 11/03/2016 1518   GLUCOSEU NEGATIVE 11/03/2016 1518   HGBUR NEGATIVE 11/03/2016 1518   BILIRUBINUR NEGATIVE 06/27/2016 0951   KETONESUR NEGATIVE 11/03/2016 1518   PROTEINUR TRACE (A) 11/03/2016 1518   UROBILINOGEN 0.2 08/09/2013 1557   NITRITE NEGATIVE 11/03/2016 1518   LEUKOCYTESUR NEGATIVE 11/03/2016 1518   Sepsis Labs Invalid input(s): PROCALCITONIN,  WBC,  LACTICIDVEN Microbiology No results found for this or any previous visit (from the past 240 hour(s)).   Time coordinating discharge: 35 minutes.   SIGNED:   Elmarie Shiley, MD  [facility] 07/18/2017, 11:36 AM Pager   If 7PM-7AM, please contact night-coverage [email or web address] Password [email or web address]

## 2017-07-18 NOTE — Progress Notes (Signed)
Patient requesting sleeping medication

## 2017-07-19 NOTE — Progress Notes (Signed)
Pt d/c from unit in wheelchair per staff. Reviewed discharge summary and instructions with patient and family. No new concerns or questions. Verbalized understanding and able to teach back. IV and tele removed. Daughter to transport home.d/c summary signed and in chart

## 2017-07-19 NOTE — Progress Notes (Signed)
Pt had 5 beat run of VT, Notified MD. MD gave order to obtain mag levels.   Mag levels WNL, MD said OK to d/c

## 2017-07-22 ENCOUNTER — Encounter: Payer: Self-pay | Admitting: Internal Medicine

## 2017-07-22 ENCOUNTER — Encounter: Payer: Self-pay | Admitting: Neurology

## 2017-07-23 ENCOUNTER — Telehealth: Payer: Self-pay | Admitting: Internal Medicine

## 2017-07-23 NOTE — Telephone Encounter (Signed)
[place] called to advise PCP;  patient declined home health that was ordered by hospital at discharge. 07-18-17

## 2017-07-30 ENCOUNTER — Other Ambulatory Visit: Payer: Self-pay

## 2017-07-30 NOTE — Patient Outreach (Signed)
[facility]) Care Management  07/30/2017  Lance Ramirez [birth year] [id number]     EMMI-Stroke RED ON EMMI ALERT Day # 9 Date: 07/29/17 Red Alert Reason: "Lost interest in things they used to enjoy? Yes   Sad, hopeless, anxious or empty? Yes"   Outreach attempt # 1 to patient. Spoke with spouse who has been answering automated call and given patient's documented history of "progressive memory loss." Spouse voices that patient is still asleep but has been doing fairly well since return home. Reviewed and addressed red alerts with spouse. She reports that she has noticed some depression in patient but that he will not admit it. She reports that he has mentioned that his health is declining and he will never return to his previous state of health from years ago. Support and education given to spouse. Advised spouse to consider discussing this with MD during next appt. She reports that patient has PCP appt the end of June for his AWV. Advised spouse that per discharge papers he is to follow up with PCP within 1-2wks. She voiced understanding and will call office to make an appt. She states that she drives patient to appts. She also has supportive son who is able to assist them as needed. RN CM confirmed with spouse that patient has all his meds and no issus or concerns regarding them. No further RN CM needs or concerns at this time. Advised spouse that they would continue to get automated EMMI-Stroke post discharge calls to assess how they are doing following recent hospitalization and will receive a call from a nurse if any of their responses were abnormal. Spouse voiced understanding and was appreciative of f/u call.       Plan: RN CM will close case as no further interventions needed at this time.    Enzo Montgomery, RN,BSN,CCM [facility] Telephonic Care Management Coordinator Direct Phone: [phone] Toll Free: [phone] Fax: [phone]

## 2017-08-03 ENCOUNTER — Encounter: Payer: Self-pay | Admitting: Internal Medicine

## 2017-08-03 NOTE — Patient Instructions (Signed)

## 2017-08-03 NOTE — Progress Notes (Signed)
Lance Ramirez     This very nice 84 y.o. MWM  was admitted to the hospital on 05.09.2019 and patient was discharged from the hospital on 05.11.2019. The patient now presents for follow up for transition from recent hospitalization.  The day after discharge our clinical staff contacted the patient to assure stability and schedule a follow up appointment. The discharge summary, medications and diagnostic test results were reviewed before meeting with the patient. The patient was admitted for:   TIA (transient ischemic attack) Multiple CVA's Hypertension Depression, major, recurrent, in partial remission ([place]) Benign prostatic hyperplasia with lower urinary tract symptoms CKD (chronic kidney disease) stage 3, GFR 30-59 ml/min (HCC) Vascular Dementia HLD      Patient was admitted with RLE weakness and was felt recovered in the ER  and TPA was not  administered. 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  NeuroHospitalist Dr Aroor recommended increasing ASA to 325 mg and adding Plavix 75 mg. He also recommended outpatient Telemetry monitoring.   As patient remained stable, he was discharged for out patient f/u.       Hospitalization discharge instructions and medications are reconciled with the patient.      Patient is also followed with Hypertension, Hyperlipidemia, Pre-Diabetes and Vitamin D Deficiency.      Patient is treated for HTN since 1988 & BP has been controlled at home. Today's BP: 138/72.  Patient has hx/o a CVA in 2012 rescued by TPA and he had a 2sd CVA in 2014. Patient has moderate Dementia felt due to ASCVD. Patient has had no complaints of any cardiac type chest pain, palpitations, dyspnea/orthopnea/PND, dizziness, claudication, or dependent edema.     Hyperlipidemia is not controlled with diet & meds. Patient denies myalgias or other med SE's. Recent  Lipids in hospital were not at  goal: Lab Results  Component Value Date   CHOL 154 07/17/2017   HDL 29 (L) 07/17/2017   LDLCALC 105 (H) 07/17/2017   TRIG 101 07/17/2017   CHOLHDL 5.3 07/17/2017      Also, the patient has history of PreDiabetes (A1c 5.8%/2011, then 6.6% in T2_DM range in 2017) and has had no symptoms of reactive hypoglycemia, diabetic polys, paresthesias or visual blurring.  Last A1c was near goal: Lab Results  Component Value Date   HGBA1C 5.7 (H) 07/17/2017      Further, the patient also has history of Vitamin D Deficiency ("26"/2008) and supplements vitamin D without any suspected side-effects. Last vitamin D was at goal: Lab Results  Component Value Date   VD25OH 86 02/10/2017   Current Outpatient Medications on File Prior to Visit  Medication Sig  . ALPRAZolam (XANAX) 1 MG tablet Take 0.5-1 tablets  at bedtime as needed  . aspirin 325 MG tablet Take 1 tablet (325 mg total) by mouth daily.  Marland Kitchen atorvastatin (LIPITOR) 80 MG tablet Take 1 tablet (80 mg total) by mouth daily at 6 PM.  . bisoprolol (ZEBETA) 10 MG tablet Take 1 tablet (10 mg total) by mouth daily.  Marland Kitchen VITAMIN D 5000 UNITS TABS Take 1 tablet by mouth 2 (two) times daily.  . citalopram (CELEXA) 40 MG tablet Take 0.5 tablets (20 mg total) by mouth daily.  . clopidogrel (PLAVIX) 75 MG tablet Take 1 tablet (75 mg total) by mouth daily.  . finasteride (PROSCAR) 5 MG tablet Take 1 tablet (5 mg total) by mouth daily.  . furosemide (LASIX)  20 MG tablet Take 1 tablet (20 mg total) by mouth daily.  . potassium chloride (K-DUR) 10 MEQ tablet Take 1 tablet (10 mEq total) by mouth daily.  Marland Kitchen terazosin (HYTRIN) 2 MG capsule TAKE 1 CAP AT BEDTIME.   No Known Allergies   PMHx:   Past Medical History:  Diagnosis Date  . Arthritis   . Cerebral embolism with cerebral infarction ([place]) 02/10/2011  . Confusion   . GERD (gastroesophageal reflux disease)   . H/O dizziness   . Hyperlipidemia   . Hypertension   . Other testicular hypofunction   .  Stroke ([place]) 05/2012   affected memory  . TIA (transient ischemic attack)   . Vitamin D deficiency    Immunization History  Administered Date(s) Administered  . DT 11/28/2008  . Influenza, High Dose Seasonal PF 11/03/2016  . Influenza,inj,quad, With Preservative 01/28/2016  . Pneumococcal Conjugate-13 01/28/2016  . Pneumococcal Polysaccharide-23 11/28/2008  . Zoster 09/16/2006   Past Surgical History:  Procedure Laterality Date  . AMPUTATION Right 1952   shot himself in toe on accident  . ANTERIOR CERVICAL DECOMP/DISCECTOMY FUSION N/A 10/25/2012   Procedure: ANTERIOR CERVICAL DECOMPRESSION/DISCECTOMY FUSION CERVICAL THREE-FOUR 1 LEVEL/HARDWARE REMOVAL;  Surgeon: Ophelia Charter, MD;  Location: [facility];  Service: Neurosurgery;  Laterality: N/A;  Cervical three-four anterior cervical decompression with fusion interbody prothesis plating and bonegraft with removal of old Premier plate  . BACK SURGERY    . HERNIA REPAIR     FHx:    Reviewed / unchanged  SHx:    Reviewed / unchanged   Systems Review:  Constitutional: Denies fever, chills, wt changes, headaches, insomnia, fatigue, night sweats, change in appetite. Eyes: Denies redness, blurred vision, diplopia, discharge, itchy, watery eyes.  ENT: Denies discharge, congestion, post nasal drip, epistaxis, sore throat, earache, hearing loss, dental pain, tinnitus, vertigo, sinus pain, snoring.  CV: Denies chest pain, palpitations, irregular heartbeat, syncope, dyspnea, diaphoresis, orthopnea, PND, claudication or edema. Respiratory: denies cough, dyspnea, DOE, pleurisy, hoarseness, laryngitis, wheezing.  Gastrointestinal: Denies dysphagia, odynophagia, heartburn, reflux, water brash, abdominal pain or cramps, nausea, vomiting, bloating, diarrhea, constipation, hematemesis, melena, hematochezia  or hemorrhoids. Genitourinary: Denies dysuria, frequency, urgency, nocturia, hesitancy, discharge, hematuria or flank pain. Musculoskeletal:  Denies arthralgias, myalgias, stiffness, jt. swelling, pain, limping or strain/sprain.  Skin: Denies pruritus, rash, hives, warts, acne, eczema or change in skin lesion(s). Neuro: No weakness, tremor, incoordination, spasms, paresthesia or pain. Psychiatric: Denies confusion, memory loss or sensory loss. Endo: Denies change in weight, skin or hair change.  Heme/Lymph: No excessive bleeding, bruising or enlarged lymph nodes.  Physical Exam  BP 138/72   Pulse 60   Temp (!) 97.5 F (36.4 C)   Resp 16   Ht 5\' 10"  (1.778 m)   Wt 192 lb 3.2 oz (87.2 kg)   BMI 27.58 kg/m   Appears elderly male in no distress.  Eyes: PERRLA, EOMs, conjunctiva no swelling or erythema. Sinuses: No frontal/maxillary tenderness ENT/Mouth: EAC's clear, TM's nl w/o erythema, bulging. Nares clear w/o erythema, swelling, exudates. Oropharynx clear without erythema or exudates. Oral hygiene is good. Tongue normal, non obstructing. Hearing intact.  Neck: Supple. Thyroid nl. Car 2+/2+ without bruits, nodes or JVD. Chest: Respirations nl with BS clear & equal w/o rales, rhonchi, wheezing or stridor.  Cor: Heart sounds normal w/ regular rate and rhythm without sig. murmurs, gallops, clicks or rubs. Peripheral pulses normal and equal  without edema.  Abdomen: Soft & bowel sounds normal. Non-tender w/o guarding, rebound, hernias,  masses or organomegaly.  Lymphatics: Unremarkable.  Musculoskeletal: Full ROM all peripheral extremities, joint stability, 5/5 strength and normal gait.  Skin: Warm, dry without exposed rashes, lesions or ecchymosis apparent.  Neuro: Cranial nerves intact, reflexes equal bilaterally. Sensory-motor testing grossly intact. Tendon reflexes grossly intact.  Pysch: Alert & oriented x 3. Flat affect.  Shot term recall is very poor and Insight and judgement nl are very limited.  Assessment and Plan:  1. TIA (transient ischemic attack)  2. History of multiple cerebrovascular accidents (CVAs)  3.  Cognitive deficit as late effect of multiple subcortical cerebrovascular accidents (CVAs)  4. Essential hypertension  - Continue medication, monitor blood pressure at home.  - Continue DASH diet. Reminder to go to the ER if any CP,  SOB, nausea, dizziness, severe HA, changes vision/speech.   - CBC with Differential/Platelet - COMPLETE METABOLIC PANEL WITH GFR - Magnesium - TSH  5. Abnormal glucose  - Continue diet, exercise, lifestyle modifications.  - Monitor appropriate labs.  - Hemoglobin A1c - Insulin, random  6. Vitamin D deficiency  - Continue supplementation.   - VITAMIN D 25 Hydroxyl  7. Depression, major, recurrent, in partial remission ([place])  8. CKD (chronic kidney disease) stage 3, GFR 30-59 ml/min (HCC)  9. Hyperlipidemia, mixed  - Continue diet/meds, exercise,& lifestyle modifications.  - Continue monitor periodic cholesterol/liver & renal functions   - COMPLETE METABOLIC PANEL WITH GFR - TSH  10. SDAT (senile dementia of Alzheimer's type)  11. vitamin B12 deficiency anemia  - Vitamin B12  12. Medication management  - CBC with Differential/Platelet - COMPLETE METABOLIC PANEL WITH GFR - Magnesium - TSH - Hemoglobin A1c - VITAMIN D 25 Hydroxyl - Vitamin B12 - Insulin, random      Discussed  regular exercise, BP monitoring, weight control to achieve/maintain BMI less than 25 and discussed meds and SE's. Recommended labs to assess and monitor clinical status with further disposition pending results of labs. Over 30 minutes of exam, counseling, chart review was performed.

## 2017-08-04 ENCOUNTER — Ambulatory Visit (INDEPENDENT_AMBULATORY_CARE_PROVIDER_SITE_OTHER): Payer: PPO | Admitting: Internal Medicine

## 2017-08-04 ENCOUNTER — Encounter: Payer: Self-pay | Admitting: Internal Medicine

## 2017-08-04 VITALS — BP 138/72 | HR 60 | Temp 97.5°F | Resp 16 | Ht 70.0 in | Wt 192.2 lb

## 2017-08-04 DIAGNOSIS — R7309 Other abnormal glucose: Secondary | ICD-10-CM

## 2017-08-04 DIAGNOSIS — G301 Alzheimer's disease with late onset: Secondary | ICD-10-CM | POA: Diagnosis not present

## 2017-08-04 DIAGNOSIS — N183 Chronic kidney disease, stage 3 unspecified: Secondary | ICD-10-CM

## 2017-08-04 DIAGNOSIS — E782 Mixed hyperlipidemia: Secondary | ICD-10-CM

## 2017-08-04 DIAGNOSIS — F3341 Major depressive disorder, recurrent, in partial remission: Secondary | ICD-10-CM | POA: Diagnosis not present

## 2017-08-04 DIAGNOSIS — G459 Transient cerebral ischemic attack, unspecified: Secondary | ICD-10-CM

## 2017-08-04 DIAGNOSIS — E559 Vitamin D deficiency, unspecified: Secondary | ICD-10-CM | POA: Diagnosis not present

## 2017-08-04 DIAGNOSIS — Z8673 Personal history of transient ischemic attack (TIA), and cerebral infarction without residual deficits: Secondary | ICD-10-CM | POA: Diagnosis not present

## 2017-08-04 DIAGNOSIS — Z79899 Other long term (current) drug therapy: Secondary | ICD-10-CM

## 2017-08-04 DIAGNOSIS — I10 Essential (primary) hypertension: Secondary | ICD-10-CM | POA: Diagnosis not present

## 2017-08-04 DIAGNOSIS — D518 Other vitamin B12 deficiency anemias: Secondary | ICD-10-CM

## 2017-08-04 DIAGNOSIS — I69319 Unspecified symptoms and signs involving cognitive functions following cerebral infarction: Secondary | ICD-10-CM

## 2017-08-04 DIAGNOSIS — F0280 Dementia in other diseases classified elsewhere without behavioral disturbance: Secondary | ICD-10-CM

## 2017-08-04 MED ORDER — BUPROPION HCL ER (XL) 150 MG PO TB24: ORAL_TABLET | ORAL | 0 refills | Status: DC

## 2017-08-06 LAB — CBC WITH DIFFERENTIAL/PLATELET
BASOS ABS: 30 {cells}/uL (ref 0–200)
Basophils Relative: 0.6 %
EOS PCT: 1.8 %
Eosinophils Absolute: 90 cells/uL (ref 15–500)
HEMATOCRIT: 44.3 % (ref 38.5–50.0)
Hemoglobin: 14.8 g/dL (ref 13.2–17.1)
LYMPHS ABS: 1470 {cells}/uL (ref 850–3900)
MCH: 28.6 pg (ref 27.0–33.0)
MCHC: 33.4 g/dL (ref 32.0–36.0)
MCV: 85.5 fL (ref 80.0–100.0)
MPV: 13.4 fL — ABNORMAL HIGH (ref 7.5–12.5)
Monocytes Relative: 7.2 %
NEUTROS PCT: 61 %
Neutro Abs: 3050 cells/uL (ref 1500–7800)
Platelets: 114 10*3/uL — ABNORMAL LOW (ref 140–400)
RBC: 5.18 10*6/uL (ref 4.20–5.80)
RDW: 14 % (ref 11.0–15.0)
TOTAL LYMPHOCYTE: 29.4 %
WBC mixed population: 360 cells/uL (ref 200–950)
WBC: 5 10*3/uL (ref 3.8–10.8)

## 2017-08-06 LAB — HEMOGLOBIN A1C
Hgb A1c MFr Bld: 5.7 % of total Hgb — ABNORMAL HIGH (ref <5.7)
MEAN PLASMA GLUCOSE: 117 (calc)
eAG (mmol/L): 6.5 (calc)

## 2017-08-06 LAB — VITAMIN B12: VITAMIN B 12: 332 pg/mL (ref 200–1100)

## 2017-08-06 LAB — COMPLETE METABOLIC PANEL WITH GFR
AG RATIO: 2 (calc) (ref 1.0–2.5)
ALKALINE PHOSPHATASE (APISO): 63 U/L (ref 40–115)
ALT: 12 U/L (ref 9–46)
AST: 16 U/L (ref 10–35)
Albumin: 4 g/dL (ref 3.6–5.1)
BUN/Creatinine Ratio: 12 (calc) (ref 6–22)
BUN: 18 mg/dL (ref 7–25)
CO2: 30 mmol/L (ref 20–32)
Calcium: 9.4 mg/dL (ref 8.6–10.3)
Chloride: 109 mmol/L (ref 98–110)
Creat: 1.46 mg/dL — ABNORMAL HIGH (ref 0.70–1.11)
GFR, Est African American: 52 mL/min/{1.73_m2} — ABNORMAL LOW (ref >=60)
GFR, Est Non African American: 44 mL/min/{1.73_m2} — ABNORMAL LOW (ref >=60)
GLOBULIN: 2 g/dL (ref 1.9–3.7)
Glucose, Bld: 134 mg/dL — ABNORMAL HIGH (ref 65–99)
Potassium: 4.4 mmol/L (ref 3.5–5.3)
SODIUM: 145 mmol/L (ref 135–146)
Total Bilirubin: 0.6 mg/dL (ref 0.2–1.2)
Total Protein: 6 g/dL — ABNORMAL LOW (ref 6.1–8.1)

## 2017-08-06 LAB — VITAMIN D 25 HYDROXY (VIT D DEFICIENCY, FRACTURES): Vit D, 25-Hydroxy: 83 ng/mL (ref 30–100)

## 2017-08-06 LAB — MAGNESIUM: MAGNESIUM: 2.1 mg/dL (ref 1.5–2.5)

## 2017-08-06 LAB — INSULIN, RANDOM: INSULIN: 11.8 u[IU]/mL (ref 2.0–19.6)

## 2017-08-06 LAB — TSH: TSH: 3.79 m[IU]/L (ref 0.40–4.50)

## 2017-08-10 ENCOUNTER — Other Ambulatory Visit: Payer: Self-pay | Admitting: *Deleted

## 2017-08-10 MED ORDER — BISOPROLOL FUMARATE 10 MG PO TABS: 10.0000 mg | ORAL_TABLET | Freq: Every day | ORAL | 0 refills | Status: DC

## 2017-08-14 ENCOUNTER — Other Ambulatory Visit: Payer: Self-pay | Admitting: Internal Medicine

## 2017-08-20 ENCOUNTER — Other Ambulatory Visit: Payer: Self-pay | Admitting: *Deleted

## 2017-08-20 MED ORDER — FUROSEMIDE 20 MG PO TABS: 20.0000 mg | ORAL_TABLET | Freq: Every day | ORAL | 1 refills | Status: DC

## 2017-08-26 ENCOUNTER — Other Ambulatory Visit: Payer: Self-pay | Admitting: Internal Medicine

## 2017-08-26 DIAGNOSIS — G4700 Insomnia, unspecified: Secondary | ICD-10-CM

## 2017-08-26 MED ORDER — ALPRAZOLAM 0.5 MG PO TABS: ORAL_TABLET | ORAL | 0 refills | Status: DC

## 2017-08-27 ENCOUNTER — Other Ambulatory Visit: Payer: Self-pay | Admitting: Internal Medicine

## 2017-08-27 DIAGNOSIS — F3341 Major depressive disorder, recurrent, in partial remission: Secondary | ICD-10-CM

## 2017-08-31 ENCOUNTER — Encounter: Payer: Self-pay | Admitting: Internal Medicine

## 2017-08-31 ENCOUNTER — Ambulatory Visit (INDEPENDENT_AMBULATORY_CARE_PROVIDER_SITE_OTHER): Payer: PPO | Admitting: Internal Medicine

## 2017-08-31 VITALS — BP 140/78 | HR 76 | Temp 97.8°F | Resp 18 | Ht 70.0 in | Wt 199.0 lb

## 2017-08-31 DIAGNOSIS — F0151 Vascular dementia with behavioral disturbance: Secondary | ICD-10-CM | POA: Diagnosis not present

## 2017-08-31 DIAGNOSIS — I700 Atherosclerosis of aorta: Secondary | ICD-10-CM | POA: Diagnosis not present

## 2017-08-31 DIAGNOSIS — Z0001 Encounter for general adult medical examination with abnormal findings: Secondary | ICD-10-CM | POA: Diagnosis not present

## 2017-08-31 DIAGNOSIS — F32A Depression, unspecified: Secondary | ICD-10-CM

## 2017-08-31 DIAGNOSIS — Z125 Encounter for screening for malignant neoplasm of prostate: Secondary | ICD-10-CM

## 2017-08-31 DIAGNOSIS — I10 Essential (primary) hypertension: Secondary | ICD-10-CM

## 2017-08-31 DIAGNOSIS — Z8249 Family history of ischemic heart disease and other diseases of the circulatory system: Secondary | ICD-10-CM | POA: Diagnosis not present

## 2017-08-31 DIAGNOSIS — Z136 Encounter for screening for cardiovascular disorders: Secondary | ICD-10-CM

## 2017-08-31 DIAGNOSIS — E782 Mixed hyperlipidemia: Secondary | ICD-10-CM | POA: Diagnosis not present

## 2017-08-31 DIAGNOSIS — F329 Major depressive disorder, single episode, unspecified: Secondary | ICD-10-CM

## 2017-08-31 DIAGNOSIS — N138 Other obstructive and reflux uropathy: Secondary | ICD-10-CM | POA: Diagnosis not present

## 2017-08-31 DIAGNOSIS — D518 Other vitamin B12 deficiency anemias: Secondary | ICD-10-CM

## 2017-08-31 DIAGNOSIS — Z87891 Personal history of nicotine dependence: Secondary | ICD-10-CM | POA: Diagnosis not present

## 2017-08-31 DIAGNOSIS — Z79899 Other long term (current) drug therapy: Secondary | ICD-10-CM | POA: Diagnosis not present

## 2017-08-31 DIAGNOSIS — N183 Chronic kidney disease, stage 3 unspecified: Secondary | ICD-10-CM

## 2017-08-31 DIAGNOSIS — R7309 Other abnormal glucose: Secondary | ICD-10-CM | POA: Diagnosis not present

## 2017-08-31 DIAGNOSIS — Z1212 Encounter for screening for malignant neoplasm of rectum: Secondary | ICD-10-CM | POA: Diagnosis not present

## 2017-08-31 DIAGNOSIS — E559 Vitamin D deficiency, unspecified: Secondary | ICD-10-CM

## 2017-08-31 DIAGNOSIS — R7303 Prediabetes: Secondary | ICD-10-CM | POA: Diagnosis not present

## 2017-08-31 DIAGNOSIS — N401 Enlarged prostate with lower urinary tract symptoms: Secondary | ICD-10-CM | POA: Diagnosis not present

## 2017-08-31 DIAGNOSIS — F01518 Vascular dementia, unspecified severity, with other behavioral disturbance: Secondary | ICD-10-CM

## 2017-08-31 DIAGNOSIS — Z8673 Personal history of transient ischemic attack (TIA), and cerebral infarction without residual deficits: Secondary | ICD-10-CM | POA: Diagnosis not present

## 2017-08-31 MED ORDER — TAMSULOSIN HCL 0.4 MG PO CAPS: ORAL_CAPSULE | ORAL | 3 refills | Status: DC

## 2017-08-31 NOTE — Progress Notes (Signed)
[facility]   Unk Pinto, M.D.     Uvaldo Bristle. Silverio Lay, P.A.-C Liane Comber, [address]                [place], [place] [address] Telephone [phone] Telefax [phone] Annual  Screening/Preventative Visit  & Comprehensive Evaluation & Examination     This very nice 84 y.o. MWM presents for a Screening /Preventative Visit & comprehensive evaluation and management of multiple medical co-morbidities.  Patient has been followed for HTN, ASCVD/CVA's/TIA, HLD, Prediabetes and Vitamin D Deficiency.     HTN predates since 34. Patient's BP has been controlled at home.  Today's BP is at [place]. Patient denies any cardiac symptoms as chest pain, palpitations, shortness of breath, dizziness or ankle swelling. Patient was recently hospitalized in May with a TIA (RLE weakness) & ASA was increased from 81 to 325 mg & Plavix was added by Dr Lorraine Lax, the NeuroHospitalist. Patient also has hx/o a CVA in 2012 (TPA) and again in 2014. Patient has mild/moderate Dementia - felt likely vascular Dementia.     Patient's hyperlipidemia is not controlled with diet and medications. Patient denies myalgias or other medication SE's. Last lipids were not at goal:  Lab Results  Component Value Date   CHOL 154 07/17/2017   HDL 29 (L) 07/17/2017   LDLCALC 105 (H) 07/17/2017   TRIG 101 07/17/2017   CHOLHDL 5.3 07/17/2017      Patient has prediabetes (A1c 5.8%/2011, then 6.6% in T2_DM range in 2017)  and patient denies reactive hypoglycemic symptoms, visual blurring, diabetic polys or paresthesias. Last A1c was almost to goal: Lab Results  Component Value Date   HGBA1C 5.7 (H) 08/04/2017       Finally, patient has history of Vitamin D Deficiency ("26"/2008) and last vitamin D was at goal: Lab Results  Component Value Date   VD25OH 83 08/04/2017   Current Outpatient Medications on File Prior to Visit   Medication Sig  . ALPRAZolam (XANAX) 0.5 MG tablet Take 1/2 to 1 tablet at bedtime  ONLY if needed for Sleep. Limit to 5 days/week to avoid addiction  . aspirin 325 MG tablet Take 1 tablet (325 mg total) by mouth daily.  . bisoprolol (ZEBETA) 10 MG tablet Take 1 tablet (10 mg total) by mouth daily.  Marland Kitchen buPROPion (WELLBUTRIN XL) 150 MG 24 hr tablet TAKE 1 TABLET EVERY MORNING FOR MOOD AND AFTER 1 MONTH INCREASE TO 300 MG DOSE  . Cholecalciferol (VITAMIN D-3) 5000 UNITS TABS Take 1 tablet by mouth 2 (two) times daily.  . citalopram (CELEXA) 40 MG tablet Take 0.5 tablets (20 mg total) by mouth daily.  . clopidogrel (PLAVIX) 75 MG tablet TAKE 1 TABLET (75 MG TOTAL) BY MOUTH DAILY.  . finasteride (PROSCAR) 5 MG tablet Take 1 tablet (5 mg total) by mouth daily.  . furosemide (LASIX) 20 MG tablet Take 1 tablet (20 mg total) by mouth daily.  . potassium chloride (K-DUR) 10 MEQ tablet Take 1 tablet (10 mEq total) by mouth daily.  Marland Kitchen terazosin (HYTRIN) 2 MG capsule TAKE 1 CAPSULE (2 MG TOTAL) BY MOUTH AT BEDTIME.   No current facility-administered medications on file prior to visit.    No Known Allergies   Past Medical History:  Diagnosis Date  . Arthritis   . Cerebral embolism with cerebral infarction ([place]) 02/10/2011  .  Confusion   . GERD (gastroesophageal reflux disease)   . H/O dizziness   . Hyperlipidemia   . Hypertension   . Other testicular hypofunction   . Stroke ([place]) 05/2012   affected memory  . TIA (transient ischemic attack)   . Vitamin D deficiency    Health Maintenance  Topic Date Due  . INFLUENZA VACCINE  10/08/2017  . TETANUS/TDAP  11/29/2018  . PNA vac Low Risk Adult  Completed   Immunization History  Administered Date(s) Administered  . DT 11/28/2008  . Influenza, High Dose Seasonal PF 11/03/2016  . Influenza,inj,quad, With Preservative 01/28/2016  . Pneumococcal Conjugate-13 01/28/2016  . Pneumococcal Polysaccharide-23 11/28/2008  . Zoster 09/16/2006   Last Colon  - 06/29/2007 - Dr Cristina Gong - recc 5 yr f/u - overdue  Past Surgical History:  Procedure Laterality Date  . AMPUTATION Right 1952   shot himself in toe on accident  . ANTERIOR CERVICAL DECOMP/DISCECTOMY FUSION N/A 10/25/2012   Procedure: ANTERIOR CERVICAL DECOMPRESSION/DISCECTOMY FUSION CERVICAL THREE-FOUR 1 LEVEL/HARDWARE REMOVAL;  Surgeon: Ophelia Charter, MD;  Location: [facility];  Service: Neurosurgery;  Laterality: N/A;  Cervical three-four anterior cervical decompression with fusion interbody prothesis plating and bonegraft with removal of old Premier plate  . BACK SURGERY    . HERNIA REPAIR     Family History  Problem Relation Age of Onset  . Sudden death Mother   . Other Father    Social History   Socioeconomic History  . Marital status: Married    Spouse name: Clara  . Number of children: 3 children  Occupational History  . Retired   Tobacco Use  . Smoking status: Former Smoker    Packs/day: 1.00    Years: 30.00    Pack years: 30.00    Types: Cigarettes    Last attempt to quit: 03/12/1978    Years since quitting: 39.4  . Smokeless tobacco: Former Systems developer    Types: [place] date: 03/12/1978  Substance and Sexual Activity  . Alcohol use: No  . Drug use: No  . Sexual activity: Never    ROS Constitutional: Denies fever, chills, weight loss/gain, headaches, insomnia,  night sweats or change in appetite. Does c/o fatigue. Eyes: Denies redness, blurred vision, diplopia, discharge, itchy or watery eyes.  ENT: Denies discharge, congestion, post nasal drip, epistaxis, sore throat, earache, hearing loss, dental pain, Tinnitus, Vertigo, Sinus pain or snoring.  Cardio: Denies chest pain, palpitations, irregular heartbeat, syncope, dyspnea, diaphoresis, orthopnea, PND, claudication or edema Respiratory: denies cough, dyspnea, DOE, pleurisy, hoarseness, laryngitis or wheezing.  Gastrointestinal: Denies dysphagia, heartburn, reflux, water brash, pain, cramps, nausea, vomiting,  bloating, diarrhea, constipation, hematemesis, melena, hematochezia, jaundice or hemorrhoids Genitourinary: Denies dysuria, urgency,  discharge, hematuria or flank pain. Reports , frequency & nocturia every 1-2 hours at night with hesitancy & decreased flow.  Musculoskeletal: Denies arthralgia, myalgia, stiffness, Jt. Swelling, pain, limp or strain/sprain. Denies Falls. Skin: Denies puritis, rash, hives, warts, acne, eczema or change in skin lesion Neuro: No weakness, tremor, incoordination, spasms, paresthesia or pain Psychiatric: Denies confusion, memory loss or sensory loss. Denies Depression. Endocrine: Denies change in weight, skin, hair change, nocturia, and paresthesia, diabetic polys, visual blurring or hyper / hypo glycemic episodes.  Heme/Lymph: No excessive bleeding, bruising or enlarged lymph nodes.  Physical Exam  BP 140/78   Pulse 76   Temp 97.8 F (36.6 C)   Resp 18   Ht 5\' 10"  (1.778 m)   Wt 199 lb (90.3 kg)  BMI 28.55 kg/m   General Appearance: Well nourished and well groomed and in no apparent distress.  Eyes: PERRLA, EOMs, conjunctiva no swelling or erythema, normal fundi and vessels. Sinuses: No frontal/maxillary tenderness ENT/Mouth: EACs patent / TMs  nl. Nares clear without erythema, swelling, mucoid exudates. Oral hygiene is good. No erythema, swelling, or exudate. Tongue normal, non-obstructing. Tonsils not swollen or erythematous. Hearing normal.  Neck: Supple, thyroid not palpable. No bruits, nodes or JVD. Respiratory: Respiratory effort normal.  BS equal and clear bilateral without rales, rhonci, wheezing or stridor. Cardio: Heart sounds are normal with regular rate and rhythm and no murmurs, rubs or gallops. Peripheral pulses are normal and equal bilaterally without edema. No aortic or femoral bruits. Chest: symmetric with normal excursions and percussion.  Abdomen: Soft, with Nl bowel sounds. Nontender, no guarding, rebound, hernias, masses, or  organomegaly.  Lymphatics: Non tender without lymphadenopathy.  Genitourinary: No hernias.Testes nl. DRE - prostate nl for age - smooth & firm w/o nodules. Musculoskeletal: Full ROM all peripheral extremities, joint stability, 5/5 strength, and normal gait. Skin: Warm and dry without rashes, lesions, cyanosis, clubbing or  ecchymosis.  Neuro: Cranial nerves intact, reflexes equal bilaterally. Normal muscle tone, no cerebellar symptoms. Sensation intact.  Pysch: Alert and oriented X 3 with normal affect, insight and judgment appropriate.   Assessment and Plan  1. Annual Preventative/Screening Exam   2. Essential hypertension  - EKG 12-Lead - [place], RETROPERITNL ABD,  LTD - Urinalysis, Routine w reflex microscopic - Microalbumin / creatinine urine ratio - CBC with Differential/Platelet - COMPLETE METABOLIC PANEL WITH GFR - Magnesium - TSH  3. Hyperlipidemia, mixed  - EKG 12-Lead - [place], RETROPERITNL ABD,  LTD - Lipid panel  4. Abnormal glucose  - EKG 12-Lead - [place], RETROPERITNL ABD,  LTD - Hemoglobin A1c - Insulin, random  5. Vitamin D deficiency  - VITAMIN D 25 Hydroxyl  6. Prediabetes  - EKG 12-Lead - [place], RETROPERITNL ABD,  LTD - Hemoglobin A1c - Insulin, random  7. History of multiple cerebrovascular accidents (CVAs)   8. CKD (chronic kidney disease) stage 3, GFR 30-59 ml/min (HCC)  - Urinalysis, Routine w reflex microscopic - Microalbumin / creatinine urine ratio - COMPLETE METABOLIC PANEL WITH GFR  9. Vascular dementia with behavior disturbance  - Lipid panel  10. Vitamin B12 deficiency anemia  - Vitamin B12  11. Depression, controlled  12. Screening for rectal cancer  - POC Hemoccult Bld/Stl  13. BPH with obstruction/lower urinary tract symptoms  - Urinalysis, Routine w reflex microscopic - PSA  14. Prostate cancer screening  - PSA  15. Screening for ischemic heart disease  - EKG 12-Lead  16. Former smoker  - EKG 12-Lead - [place],  RETROPERITNL ABD,  LTD  17. FHx: heart disease  - EKG 12-Lead - [place], RETROPERITNL ABD,  LTD  18. Aortic atherosclerosis (HCC)  - EKG 12-Lead - [place], RETROPERITNL ABD,  LTD  19. Screening for AAA (aortic abdominal aneurysm)  - [place], RETROPERITNL ABD,  LTD  20. Medication management  - Urinalysis, Routine w reflex microscopic - Microalbumin / creatinine urine ratio - CBC with Differential/Platelet - COMPLETE METABOLIC PANEL WITH GFR - Magnesium - Lipid panel - TSH - Hemoglobin A1c - Insulin, random - VITAMIN D 25 Hydroxyl       Patient was counseled in prudent diet, weight control to achieve/maintain BMI less than 25, BP monitoring, regular exercise and medications as discussed.  Discussed med effects and SE's. Routine screening labs and tests as  requested with regular follow-up as recommended. Over 40 minutes of exam, counseling, chart review and high complex critical decision making was performed

## 2017-08-31 NOTE — Patient Instructions (Signed)

## 2017-09-01 LAB — COMPLETE METABOLIC PANEL WITH GFR
AG RATIO: 1.9 (calc) (ref 1.0–2.5)
ALT: 11 U/L (ref 9–46)
AST: 11 U/L (ref 10–35)
Albumin: 3.9 g/dL (ref 3.6–5.1)
Alkaline phosphatase (APISO): 49 U/L (ref 40–115)
BUN/Creatinine Ratio: 11 (calc) (ref 6–22)
BUN: 20 mg/dL (ref 7–25)
CALCIUM: 9.7 mg/dL (ref 8.6–10.3)
CHLORIDE: 106 mmol/L (ref 98–110)
CO2: 32 mmol/L (ref 20–32)
Creat: 1.85 mg/dL — ABNORMAL HIGH (ref 0.70–1.11)
GFR, EST AFRICAN AMERICAN: 39 mL/min/{1.73_m2} — AB (ref >=60)
GFR, EST NON AFRICAN AMERICAN: 33 mL/min/{1.73_m2} — AB (ref >=60)
Globulin: 2.1 g/dL (calc) (ref 1.9–3.7)
Glucose, Bld: 107 mg/dL — ABNORMAL HIGH (ref 65–99)
POTASSIUM: 4.4 mmol/L (ref 3.5–5.3)
Sodium: 144 mmol/L (ref 135–146)
TOTAL PROTEIN: 6 g/dL — AB (ref 6.1–8.1)
Total Bilirubin: 0.7 mg/dL (ref 0.2–1.2)

## 2017-09-01 LAB — INSULIN, RANDOM: Insulin: 4.7 u[IU]/mL (ref 2.0–19.6)

## 2017-09-01 LAB — CBC WITH DIFFERENTIAL/PLATELET
Basophils Absolute: 39 cells/uL (ref 0–200)
Basophils Relative: 0.8 %
Eosinophils Absolute: 59 cells/uL (ref 15–500)
Eosinophils Relative: 1.2 %
HEMATOCRIT: 42.3 % (ref 38.5–50.0)
Hemoglobin: 14.1 g/dL (ref 13.2–17.1)
LYMPHS ABS: 1117 {cells}/uL (ref 850–3900)
MCH: 28.9 pg (ref 27.0–33.0)
MCHC: 33.3 g/dL (ref 32.0–36.0)
MCV: 86.7 fL (ref 80.0–100.0)
MPV: 11.7 fL (ref 7.5–12.5)
Monocytes Relative: 7.1 %
NEUTROS PCT: 68.1 %
Neutro Abs: 3337 cells/uL (ref 1500–7800)
Platelets: 145 10*3/uL (ref 140–400)
RBC: 4.88 10*6/uL (ref 4.20–5.80)
RDW: 14 % (ref 11.0–15.0)
Total Lymphocyte: 22.8 %
WBC: 4.9 10*3/uL (ref 3.8–10.8)
WBCMIX: 348 {cells}/uL (ref 200–950)

## 2017-09-01 LAB — URINALYSIS, ROUTINE W REFLEX MICROSCOPIC
BACTERIA UA: NONE SEEN /HPF
Bilirubin Urine: NEGATIVE
Glucose, UA: NEGATIVE
HGB URINE DIPSTICK: NEGATIVE
HYALINE CAST: NONE SEEN /LPF
Ketones, ur: NEGATIVE
Leukocytes, UA: NEGATIVE
Nitrite: NEGATIVE
PROTEIN: NEGATIVE
RBC / HPF: NONE SEEN /HPF (ref 0–2)
Specific Gravity, Urine: 1.015 (ref 1.001–1.03)
Squamous Epithelial / LPF: NONE SEEN /HPF (ref <=5)
WBC UA: NONE SEEN /HPF (ref 0–5)
pH: 6 (ref 5.0–8.0)

## 2017-09-01 LAB — HEMOGLOBIN A1C
EAG (MMOL/L): 6.5 (calc)
Hgb A1c MFr Bld: 5.7 % of total Hgb — ABNORMAL HIGH (ref <5.7)
MEAN PLASMA GLUCOSE: 117 (calc)

## 2017-09-01 LAB — TSH: TSH: 2.93 mIU/L (ref 0.40–4.50)

## 2017-09-01 LAB — LIPID PANEL
CHOL/HDL RATIO: 3.6 (calc) (ref <5.0)
CHOLESTEROL: 197 mg/dL (ref <200)
HDL: 54 mg/dL (ref >40)
LDL Cholesterol (Calc): 124 mg/dL (calc) — ABNORMAL HIGH
NON-HDL CHOLESTEROL (CALC): 143 mg/dL — AB (ref <130)
TRIGLYCERIDES: 87 mg/dL (ref <150)

## 2017-09-01 LAB — MICROALBUMIN / CREATININE URINE RATIO
Creatinine, Urine: 143 mg/dL (ref 20–320)
MICROALB UR: 2.6 mg/dL
MICROALB/CREAT RATIO: 18 ug/mg{creat} (ref <30)

## 2017-09-01 LAB — PSA: PSA: 1.2 ng/mL (ref <=4.0)

## 2017-09-01 LAB — VITAMIN B12: Vitamin B-12: 607 pg/mL (ref 200–1100)

## 2017-09-01 LAB — MAGNESIUM: Magnesium: 2 mg/dL (ref 1.5–2.5)

## 2017-09-01 LAB — VITAMIN D 25 HYDROXY (VIT D DEFICIENCY, FRACTURES): VIT D 25 HYDROXY: 78 ng/mL (ref 30–100)

## 2017-09-03 ENCOUNTER — Other Ambulatory Visit: Payer: Self-pay | Admitting: Internal Medicine

## 2017-10-02 ENCOUNTER — Ambulatory Visit: Payer: PPO | Admitting: Neurology

## 2017-10-05 ENCOUNTER — Other Ambulatory Visit: Payer: Self-pay | Admitting: Internal Medicine

## 2017-10-10 ENCOUNTER — Other Ambulatory Visit: Payer: Self-pay | Admitting: Adult Health

## 2017-10-10 DIAGNOSIS — F3341 Major depressive disorder, recurrent, in partial remission: Secondary | ICD-10-CM

## 2017-10-20 ENCOUNTER — Other Ambulatory Visit: Payer: Self-pay | Admitting: Internal Medicine

## 2017-10-20 DIAGNOSIS — G4700 Insomnia, unspecified: Secondary | ICD-10-CM

## 2017-11-05 ENCOUNTER — Other Ambulatory Visit: Payer: Self-pay | Admitting: Internal Medicine

## 2017-11-05 DIAGNOSIS — I10 Essential (primary) hypertension: Secondary | ICD-10-CM

## 2017-11-25 ENCOUNTER — Other Ambulatory Visit: Payer: Self-pay | Admitting: Internal Medicine

## 2017-11-25 DIAGNOSIS — F3341 Major depressive disorder, recurrent, in partial remission: Secondary | ICD-10-CM

## 2017-12-03 ENCOUNTER — Other Ambulatory Visit: Payer: Self-pay | Admitting: Internal Medicine

## 2017-12-06 ENCOUNTER — Other Ambulatory Visit: Payer: Self-pay | Admitting: Adult Health

## 2017-12-06 DIAGNOSIS — R3914 Feeling of incomplete bladder emptying: Principal | ICD-10-CM

## 2017-12-06 DIAGNOSIS — N401 Enlarged prostate with lower urinary tract symptoms: Secondary | ICD-10-CM

## 2017-12-09 DIAGNOSIS — E663 Overweight: Secondary | ICD-10-CM | POA: Insufficient documentation

## 2017-12-09 NOTE — Progress Notes (Deleted)
MEDICARE ANNUAL WELLNESS VISIT AND FOLLOW UP Assessment:   Diagnoses and all orders for this visit:  Encounter for Medicare annual wellness exam  Atherosclerosis of aorta Control blood pressure, cholesterol, glucose, increase exercise.   Essential hypertension Continue medications Monitor blood pressure at home; call if consistently over 130/80 Continue DASH diet.   Reminder to go to the ER if any CP, SOB, nausea, dizziness, severe HA, changes vision/speech, left arm numbness and tingling and jaw pain.  Gastroesophageal reflux disease, esophagitis presence not specified Well managed on current plan with PRN OTC agents Discussed diet, avoiding triggers and other lifestyle changes  SDAT (senile dementia of Alzheimer's type) Suspected vascular component per last neuro eval - continue BP treatments and plavix ***  Mixed hyperlipidemia No longer treated by statin secondary to age Continue low cholesterol diet and exercise.  Lipids were checked at last visit - defer today as would not aggressively pursue  History of CVA (cerebrovascular accident) Continue tight control of BP Continue plavix, ASA as recommended by neurology  Vitamin D deficiency Continue supplementation -     VITAMIN D 25 Hydroxy (Vit-D Deficiency, Fractures)  Prediabetes Discussed disease and risks Discussed diet/exercise, weight management  -     Hemoglobin A1c  Medication management -     CBC with Differential/Platelet -     CMP/GFR  Depression, major, recurrent, in partial remission ([place]) Continue medications  Lifestyle discussed: diet/exerise, sleep hygiene, stress management, hydration  At high risk for falls/imbalance PT did follow him at home; evaluated for falls risk, concluded no need for continued therapy ***  CKD (chronic kidney disease) stage 3, GFR 30-59 ml/min (HCC) Increase fluids, avoid NSAIDS, monitor sugars, will monitor -     BASIC METABOLIC PANEL WITH GFR  Benign prostatic  hyperplasia with lower urinary tract symptoms Continue terazosin, starting finasteride today, refer to urology as needed  BMI 28  Over 30 minutes of exam, counseling, chart review, and critical decision making was performed  Future Appointments  Date Time Provider [place]  12/10/2017  4:00 PM Liane Comber, NP [facility] None  03/16/2018  3:30 PM Unk Pinto, MD [facility] None  09/29/2018  3:00 PM Unk Pinto, MD [facility] None    Plan:   During the course of the visit the patient was educated and counseled about appropriate screening and preventive services including:    Pneumococcal vaccine   Influenza vaccine  Prevnar 13  Td vaccine  Screening electrocardiogram  Colorectal cancer screening  Diabetes screening  Glaucoma screening  Nutrition counseling    Subjective:  Lance Ramirez is a 84 y.o. male who presents accompanied by his daughter for Medicare Annual Wellness Visit and 3 month follow up for HTN, hyperlipidemia, prediabetes, and vitamin D Def. has Hypertension; Hyperlipidemia; History of CVA (cerebrovascular accident); GERD (gastroesophageal reflux disease); Vitamin D deficiency; Prediabetes; Medication management; Depression, major, recurrent, in partial remission ([place]); At high risk for falls; SDAT (senile dementia of Alzheimer's type) ([place]); Benign prostatic hyperplasia with lower urinary tract symptoms; CKD (chronic kidney disease) stage 3, GFR 30-59 ml/min ([place]); Aortic atherosclerosis ([place]); and Overweight (BMI 25.0-29.9) on their problem list. Patient was recently hospitalized in May 2019 with a TIA (RLE weakness), at time which ASA was increased from 81 to 325 mg & Plavix was added by Dr Lorraine Lax, the NeuroHospitalist. Patient also has hx/o a CVA in 2012 (TPA) and again in 2014. Patient has mild/moderate Dementia - felt likely vascular Dementia.  BMI is There is no height or weight on file  to calculate BMI., he {HAS HAS [id number]} been  working on diet and exercise. Wt Readings from Last 3 Encounters:  08/31/17 199 lb (90.3 kg)  08/04/17 192 lb 3.2 oz (87.2 kg)  07/17/17 195 lb 8.8 oz (88.7 kg)   His blood pressure has been controlled at home, today their BP is   He does workout. He denies chest pain, shortness of breath, dizziness.   He is not on cholesterol medication secondary to age. His cholesterol is not at goal. The cholesterol last visit was:  Lab Results  Component Value Date   CHOL 197 08/31/2017   HDL 54 08/31/2017   LDLCALC 124 (H) 08/31/2017   TRIG 87 08/31/2017   CHOLHDL 3.6 08/31/2017   He has been working on diet and exercise for prediabetes, and denies nausea, paresthesia of the feet, polydipsia, polyuria, visual disturbances and vomiting. Last A1C in the office was:  Lab Results  Component Value Date   HGBA1C 5.7 (H) 08/31/2017   Last GFR Lab Results  Component Value Date   GFRNONAA 33 (L) 08/31/2017    Patient is on Vitamin D supplement and at goal:    Lab Results  Component Value Date   VD25OH 78 08/31/2017      Medication Review: Current Outpatient Medications on File Prior to Visit  Medication Sig Dispense Refill  . ALPRAZolam (XANAX) 0.5 MG tablet TAKE 1/2 TO 1 TABLET AT BEDTIME ONLY IF NEEDED FOR SLEEP. LIMIT TO 5 DAYS/WEEK TO AVOID ADDICTION 30 tablet 0  . aspirin 325 MG tablet Take 1 tablet (325 mg total) by mouth daily. 30 tablet 0  . bisoprolol (ZEBETA) 10 MG tablet Take 1 tablet (10 mg total) by mouth daily. 90 tablet 0  . bisoprolol-hydrochlorothiazide (ZIAC) 10-6.25 MG tablet TAKE 1 TABLET BY MOUTH EVERY DAY 90 tablet 0  . buPROPion (WELLBUTRIN XL) 150 MG 24 hr tablet TAKE 1 TABLET EVERY MORNING FOR MOOD AND AFTER 1 MONTH INCREASE TO 300 MG DOSE 90 tablet 1  . Cholecalciferol (VITAMIN D-3) 5000 UNITS TABS Take 1 tablet by mouth 2 (two) times daily.    . citalopram (CELEXA) 40 MG tablet TAKE 1 TABLET BY MOUTH EVERY DAY 90 tablet 0  . clopidogrel (PLAVIX) 75 MG tablet TAKE 1  TABLET (75 MG TOTAL) BY MOUTH DAILY. 90 tablet 0  . finasteride (PROSCAR) 5 MG tablet TAKE 1 TABLET BY MOUTH EVERY DAY 90 tablet 1  . furosemide (LASIX) 20 MG tablet Take 1 tablet (20 mg total) by mouth daily. 90 tablet 1  . losartan (COZAAR) 100 MG tablet TAKE 1 TABLET BY MOUTH EVERY DAY 90 tablet 0  . minoxidil (LONITEN) 10 MG tablet TAKE 1/2 TO 1 TABLET DAILY FOR BLOOD PRESSURE 90 tablet 0  . potassium chloride (K-DUR) 10 MEQ tablet Take 1 tablet (10 mEq total) by mouth daily. 20 tablet 0  . tamsulosin (FLOMAX) 0.4 MG CAPS capsule Take 1 capsule at bedtime for Prostate & Urinary Frequency 90 capsule 3  . terazosin (HYTRIN) 2 MG capsule TAKE 1 CAPSULE (2 MG TOTAL) BY MOUTH AT BEDTIME. 90 capsule 1   No current facility-administered medications on file prior to visit.     Allergies: No Known Allergies  Current Problems (verified) has Hypertension; Hyperlipidemia; History of CVA (cerebrovascular accident); GERD (gastroesophageal reflux disease); Vitamin D deficiency; Prediabetes; Medication management; Depression, major, recurrent, in partial remission ([place]); At high risk for falls; SDAT (senile dementia of Alzheimer's type) ([place]); Benign prostatic hyperplasia with lower urinary tract symptoms; CKD (  chronic kidney disease) stage 3, GFR 30-59 ml/min ([place]); Aortic atherosclerosis ([place]); and Overweight (BMI 25.0-29.9) on their problem list.  Screening Tests Immunization History  Administered Date(s) Administered  . DT 11/28/2008  . Influenza, High Dose Seasonal PF 11/03/2016  . Influenza,inj,quad, With Preservative 01/28/2016  . Pneumococcal Conjugate-13 01/28/2016  . Pneumococcal Polysaccharide-23 11/28/2008  . Zoster 09/16/2006   Preventative care: Last colonoscopy: 2009, DONE  Prior vaccinations: TD or Tdap: 2010  Influenza: 2018  Pneumococcal: 2010 Prevnar13: 2017 Shingles/Zostavax: 2008  Names of Other Physician/Practitioners you currently use: 1. [facility] here for primary care 2. eye doctor, does not regularly see, last visit 2012 3. 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 Family His family history includes Other in his father; Sudden death in his mother. Social history  He reports that he quit smoking about 39 years ago. His smoking use included cigarettes. He has a 30.00 pack-year smoking history. He quit smokeless tobacco use about 39 years ago.  His smokeless tobacco use included chew. He reports that he does not drink alcohol or use drugs.  MEDICARE WELLNESS OBJECTIVES: Physical activity:   Cardiac risk factors:   Depression/mood screen:   Depression screen [facility] 2/9 08/31/2017  Decreased Interest 0  Down, Depressed, Hopeless 0  PHQ - 2 Score 0  Altered sleeping -  Tired, decreased energy -  Change in appetite -  Feeling bad or failure about yourself  -  Trouble concentrating -  Moving slowly or fidgety/restless -  Suicidal thoughts -  PHQ-9 Score -  Difficult doing work/chores -    ADLs:  In your present state of health, do you have any difficulty performing the following activities: 08/31/2017 08/04/2017  Hearing? - N  Vision? N N  Difficulty concentrating or making decisions? Y N  Comment mild-moderate Dementia with poor ST recall -  Walking or climbing stairs? N N  Dressing or bathing? N N  Doing errands, shopping? N N  Comment - -  Some recent data might be hidden     Cognitive Testing  Alert? Yes  Normal  Appearance?Yes  Oriented to person? Yes  Place? Yes   Time? Yes  Recall of three objects?  No  Can perform simple calculations? No  Displays appropriate judgment?Yes  Can read the correct time from a watch face?Yes  EOL planning:     Objective:   There were no vitals filed for this visit. There is no height or weight on file to calculate BMI.  General appearance: alert, no distress, WD/WN, male HEENT: normocephalic, sclerae anicteric, TMs pearly, nares patent, no discharge or erythema, pharynx normal Oral cavity: MMM, no lesions Neck: supple, no lymphadenopathy, no thyromegaly, no masses Heart: RRR, normal S1, S2, no murmurs Lungs: CTA bilaterally, no wheezes, rhonchi, or rales Abdomen: +bs, soft, non tender, non distended, no masses, no hepatomegaly, no splenomegaly Musculoskeletal: nontender, no swelling, no obvious deformity Extremities: no edema, no cyanosis, no clubbing Pulses: 2+ symmetric, upper and lower extremities, normal cap refill Neurological: alert, oriented x 3, CN2-12 intact, strength normal upper extremities and lower extremities, sensation normal  throughout, DTRs 2+ throughout, no cerebellar signs, gait normal Psychiatric: normal affect, behavior normal, pleasant   Medicare Attestation I have personally reviewed: The patient's medical and social history Their use of alcohol, tobacco or illicit drugs Their current medications and supplements The patient's functional ability including ADLs,fall risks, home safety risks, cognitive, and hearing and visual impairment Diet and physical activities Evidence for depression or mood disorders  The patient's weight, height, BMI, and visual acuity have been recorded in the chart.  I have made referrals, counseling, and provided education to the patient based on review of the above and I have provided the patient with a written personalized care plan for preventive services.     Izora Ribas, NP   12/09/2017

## 2017-12-10 ENCOUNTER — Ambulatory Visit: Payer: Self-pay | Admitting: Adult Health

## 2017-12-10 ENCOUNTER — Ambulatory Visit: Payer: Self-pay | Admitting: Physician Assistant

## 2017-12-14 NOTE — Progress Notes (Deleted)
MEDICARE ANNUAL WELLNESS VISIT AND FOLLOW UP Assessment:   Diagnoses and all orders for this visit:  Encounter for Medicare annual wellness exam  Atherosclerosis of aorta Control blood pressure, cholesterol, glucose, increase exercise.   Essential hypertension Continue medications Monitor blood pressure at home; call if consistently over 130/80 Continue DASH diet.   Reminder to go to the ER if any CP, SOB, nausea, dizziness, severe HA, changes vision/speech, left arm numbness and tingling and jaw pain.  Gastroesophageal reflux disease, esophagitis presence not specified Well managed on current plan with PRN OTC agents Discussed diet, avoiding triggers and other lifestyle changes  SDAT (senile dementia of Alzheimer's type) Suspected vascular component per last neuro eval - continue BP treatments and plavix ***  Mixed hyperlipidemia No longer treated by statin secondary to age Continue low cholesterol diet and exercise.  Lipids were checked at last visit - defer today as would not aggressively pursue  History of CVA (cerebrovascular accident) Continue tight control of BP Continue plavix, ASA as recommended by neurology  Vitamin D deficiency Continue supplementation -     VITAMIN D 25 Hydroxy (Vit-D Deficiency, Fractures)  Prediabetes Discussed disease and risks Discussed diet/exercise, weight management  -     Hemoglobin A1c  Medication management -     CBC with Differential/Platelet -     CMP/GFR  Depression, major, recurrent, in partial remission ([place]) Continue medications  Lifestyle discussed: diet/exerise, sleep hygiene, stress management, hydration  At high risk for falls/imbalance PT did follow him at home; evaluated for falls risk, concluded no need for continued therapy ***  CKD (chronic kidney disease) stage 3, GFR 30-59 ml/min (HCC) Increase fluids, avoid NSAIDS, monitor sugars, will monitor -     BASIC METABOLIC PANEL WITH GFR  Benign prostatic  hyperplasia with lower urinary tract symptoms Continue terazosin, starting finasteride today, refer to urology as needed  BMI 28  Over 30 minutes of exam, counseling, chart review, and critical decision making was performed  Future Appointments  Date Time Provider [place]  12/15/2017  3:00 PM Liane Comber, NP [facility] None  03/16/2018  3:30 PM Unk Pinto, MD [facility] None  09/29/2018  3:00 PM Unk Pinto, MD [facility] None    Plan:   During the course of the visit the patient was educated and counseled about appropriate screening and preventive services including:    Pneumococcal vaccine   Influenza vaccine  Prevnar 13  Td vaccine  Screening electrocardiogram  Colorectal cancer screening  Diabetes screening  Glaucoma screening  Nutrition counseling    Subjective:  Lance Ramirez is a 84 y.o. male who presents accompanied by his daughter for Medicare Annual Wellness Visit and 3 month follow up for HTN, hyperlipidemia, prediabetes, and vitamin D Def. has Hypertension; Hyperlipidemia; History of CVA (cerebrovascular accident); GERD (gastroesophageal reflux disease); Vitamin D deficiency; Prediabetes; Medication management; Depression, major, recurrent, in partial remission ([place]); At high risk for falls; SDAT (senile dementia of Alzheimer's type) ([place]); Benign prostatic hyperplasia with lower urinary tract symptoms; CKD (chronic kidney disease) stage 3, GFR 30-59 ml/min ([place]); Aortic atherosclerosis ([place]); and Overweight (BMI 25.0-29.9) on their problem list. Patient was recently hospitalized in May 2019 with a TIA (RLE weakness), at time which ASA was increased from 81 to 325 mg & Plavix was added by Dr Lorraine Lax, the NeuroHospitalist. Patient also has hx/o a CVA in 2012 (TPA) and again in 2014. Patient has mild/moderate Dementia - felt likely vascular Dementia.  BMI is There is no height or weight on file  to calculate BMI., he {HAS HAS [id number]} been  working on diet and exercise. Wt Readings from Last 3 Encounters:  08/31/17 199 lb (90.3 kg)  08/04/17 192 lb 3.2 oz (87.2 kg)  07/17/17 195 lb 8.8 oz (88.7 kg)   His blood pressure has been controlled at home, today their BP is   He does workout. He denies chest pain, shortness of breath, dizziness.   He is not on cholesterol medication secondary to age. His cholesterol is not at goal. The cholesterol last visit was:  Lab Results  Component Value Date   CHOL 197 08/31/2017   HDL 54 08/31/2017   LDLCALC 124 (H) 08/31/2017   TRIG 87 08/31/2017   CHOLHDL 3.6 08/31/2017   He has been working on diet and exercise for prediabetes, and denies nausea, paresthesia of the feet, polydipsia, polyuria, visual disturbances and vomiting. Last A1C in the office was:  Lab Results  Component Value Date   HGBA1C 5.7 (H) 08/31/2017   Last GFR Lab Results  Component Value Date   GFRNONAA 33 (L) 08/31/2017    Patient is on Vitamin D supplement and at goal:    Lab Results  Component Value Date   VD25OH 78 08/31/2017      Medication Review: Current Outpatient Medications on File Prior to Visit  Medication Sig Dispense Refill  . ALPRAZolam (XANAX) 0.5 MG tablet TAKE 1/2 TO 1 TABLET AT BEDTIME ONLY IF NEEDED FOR SLEEP. LIMIT TO 5 DAYS/WEEK TO AVOID ADDICTION 30 tablet 0  . aspirin 325 MG tablet Take 1 tablet (325 mg total) by mouth daily. 30 tablet 0  . bisoprolol (ZEBETA) 10 MG tablet Take 1 tablet (10 mg total) by mouth daily. 90 tablet 0  . bisoprolol-hydrochlorothiazide (ZIAC) 10-6.25 MG tablet TAKE 1 TABLET BY MOUTH EVERY DAY 90 tablet 0  . buPROPion (WELLBUTRIN XL) 150 MG 24 hr tablet TAKE 1 TABLET EVERY MORNING FOR MOOD AND AFTER 1 MONTH INCREASE TO 300 MG DOSE 90 tablet 1  . Cholecalciferol (VITAMIN D-3) 5000 UNITS TABS Take 1 tablet by mouth 2 (two) times daily.    . citalopram (CELEXA) 40 MG tablet TAKE 1 TABLET BY MOUTH EVERY DAY 90 tablet 0  . clopidogrel (PLAVIX) 75 MG tablet TAKE 1  TABLET (75 MG TOTAL) BY MOUTH DAILY. 90 tablet 0  . finasteride (PROSCAR) 5 MG tablet TAKE 1 TABLET BY MOUTH EVERY DAY 90 tablet 1  . furosemide (LASIX) 20 MG tablet Take 1 tablet (20 mg total) by mouth daily. 90 tablet 1  . losartan (COZAAR) 100 MG tablet TAKE 1 TABLET BY MOUTH EVERY DAY 90 tablet 0  . minoxidil (LONITEN) 10 MG tablet TAKE 1/2 TO 1 TABLET DAILY FOR BLOOD PRESSURE 90 tablet 0  . potassium chloride (K-DUR) 10 MEQ tablet Take 1 tablet (10 mEq total) by mouth daily. 20 tablet 0  . tamsulosin (FLOMAX) 0.4 MG CAPS capsule Take 1 capsule at bedtime for Prostate & Urinary Frequency 90 capsule 3  . terazosin (HYTRIN) 2 MG capsule TAKE 1 CAPSULE (2 MG TOTAL) BY MOUTH AT BEDTIME. 90 capsule 1   No current facility-administered medications on file prior to visit.     Allergies: No Known Allergies  Current Problems (verified) has Hypertension; Hyperlipidemia; History of CVA (cerebrovascular accident); GERD (gastroesophageal reflux disease); Vitamin D deficiency; Prediabetes; Medication management; Depression, major, recurrent, in partial remission ([place]); At high risk for falls; SDAT (senile dementia of Alzheimer's type) ([place]); Benign prostatic hyperplasia with lower urinary tract symptoms; CKD (  chronic kidney disease) stage 3, GFR 30-59 ml/min ([place]); Aortic atherosclerosis ([place]); and Overweight (BMI 25.0-29.9) on their problem list.  Screening Tests Immunization History  Administered Date(s) Administered  . DT 11/28/2008  . Influenza, High Dose Seasonal PF 11/03/2016  . Influenza,inj,quad, With Preservative 01/28/2016  . Pneumococcal Conjugate-13 01/28/2016  . Pneumococcal Polysaccharide-23 11/28/2008  . Zoster 09/16/2006   Preventative care: Last colonoscopy: 2009, DONE  Prior vaccinations: TD or Tdap: 2010  Influenza: 2018  Pneumococcal: 2010 Prevnar13: 2017 Shingles/Zostavax: 2008  Names of Other Physician/Practitioners you currently use: 1. [facility] here for primary care 2. eye doctor, does not regularly see, last visit 2012 3. 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 Family His family history includes Other in his father; Sudden death in his mother. Social history  He reports that he quit smoking about 39 years ago. His smoking use included cigarettes. He has a 30.00 pack-year smoking history. He quit smokeless tobacco use about 39 years ago.  His smokeless tobacco use included chew. He reports that he does not drink alcohol or use drugs.  MEDICARE WELLNESS OBJECTIVES: Physical activity:   Cardiac risk factors:   Depression/mood screen:   Depression screen [facility] 2/9 08/31/2017  Decreased Interest 0  Down, Depressed, Hopeless 0  PHQ - 2 Score 0  Altered sleeping -  Tired, decreased energy -  Change in appetite -  Feeling bad or failure about yourself  -  Trouble concentrating -  Moving slowly or fidgety/restless -  Suicidal thoughts -  PHQ-9 Score -  Difficult doing work/chores -    ADLs:  In your present state of health, do you have any difficulty performing the following activities: 08/31/2017 08/04/2017  Hearing? - N  Vision? N N  Difficulty concentrating or making decisions? Y N  Comment mild-moderate Dementia with poor ST recall -  Walking or climbing stairs? N N  Dressing or bathing? N N  Doing errands, shopping? N N  Comment - -  Some recent data might be hidden     Cognitive Testing  Alert? Yes  Normal  Appearance?Yes  Oriented to person? Yes  Place? Yes   Time? Yes  Recall of three objects?  No  Can perform simple calculations? No  Displays appropriate judgment?Yes  Can read the correct time from a watch face?Yes  EOL planning:     Objective:   There were no vitals filed for this visit. There is no height or weight on file to calculate BMI.  General appearance: alert, no distress, WD/WN, male HEENT: normocephalic, sclerae anicteric, TMs pearly, nares patent, no discharge or erythema, pharynx normal Oral cavity: MMM, no lesions Neck: supple, no lymphadenopathy, no thyromegaly, no masses Heart: RRR, normal S1, S2, no murmurs Lungs: CTA bilaterally, no wheezes, rhonchi, or rales Abdomen: +bs, soft, non tender, non distended, no masses, no hepatomegaly, no splenomegaly Musculoskeletal: nontender, no swelling, no obvious deformity Extremities: no edema, no cyanosis, no clubbing Pulses: 2+ symmetric, upper and lower extremities, normal cap refill Neurological: alert, oriented x 3, CN2-12 intact, strength normal upper extremities and lower extremities, sensation normal  throughout, DTRs 2+ throughout, no cerebellar signs, gait normal Psychiatric: normal affect, behavior normal, pleasant   Medicare Attestation I have personally reviewed: The patient's medical and social history Their use of alcohol, tobacco or illicit drugs Their current medications and supplements The patient's functional ability including ADLs,fall risks, home safety risks, cognitive, and hearing and visual impairment Diet and physical activities Evidence for depression or mood disorders  The patient's weight, height, BMI, and visual acuity have been recorded in the chart.  I have made referrals, counseling, and provided education to the patient based on review of the above and I have provided the patient with a written personalized care plan for preventive services.     Izora Ribas, NP   12/14/2017

## 2017-12-15 ENCOUNTER — Ambulatory Visit: Payer: Self-pay | Admitting: Adult Health

## 2018-01-01 ENCOUNTER — Other Ambulatory Visit: Payer: Self-pay | Admitting: Internal Medicine

## 2018-01-04 ENCOUNTER — Other Ambulatory Visit: Payer: Self-pay | Admitting: Internal Medicine

## 2018-01-04 DIAGNOSIS — F3341 Major depressive disorder, recurrent, in partial remission: Secondary | ICD-10-CM

## 2018-01-06 ENCOUNTER — Other Ambulatory Visit: Payer: Self-pay | Admitting: Adult Health

## 2018-01-06 DIAGNOSIS — G4700 Insomnia, unspecified: Secondary | ICD-10-CM

## 2018-01-06 MED ORDER — ALPRAZOLAM 0.5 MG PO TABS: ORAL_TABLET | ORAL | 0 refills | Status: DC

## 2018-01-07 ENCOUNTER — Other Ambulatory Visit: Payer: Self-pay | Admitting: Internal Medicine

## 2018-01-07 DIAGNOSIS — N401 Enlarged prostate with lower urinary tract symptoms: Secondary | ICD-10-CM

## 2018-01-08 ENCOUNTER — Other Ambulatory Visit: Payer: Self-pay | Admitting: Internal Medicine

## 2018-01-08 DIAGNOSIS — F3341 Major depressive disorder, recurrent, in partial remission: Secondary | ICD-10-CM

## 2018-01-08 NOTE — Progress Notes (Deleted)
MEDICARE ANNUAL WELLNESS VISIT AND FOLLOW UP Assessment:   Diagnoses and all orders for this visit:  Encounter for Medicare annual wellness exam  Atherosclerosis of aorta Control blood pressure, cholesterol, glucose, increase exercise.   Essential hypertension Continue medications Monitor blood pressure at home; call if consistently over 130/80 Continue DASH diet.   Reminder to go to the ER if any CP, SOB, nausea, dizziness, severe HA, changes vision/speech, left arm numbness and tingling and jaw pain.  Gastroesophageal reflux disease, esophagitis presence not specified Well managed on current plan with PRN OTC agents Discussed diet, avoiding triggers and other lifestyle changes  SDAT (senile dementia of Alzheimer's type) Suspected vascular component per last neuro eval - continue BP treatments and plavix ***  Mixed hyperlipidemia No longer treated by statin secondary to age Continue low cholesterol diet and exercise.  Lipids were checked at last visit - defer today as would not aggressively pursue  History of CVA (cerebrovascular accident) Continue tight control of BP Continue plavix, ASA as recommended by neurology  Vitamin D deficiency Continue supplementation -     VITAMIN D 25 Hydroxy (Vit-D Deficiency, Fractures)  Prediabetes Discussed disease and risks Discussed diet/exercise, weight management  -     Hemoglobin A1c  Medication management -     CBC with Differential/Platelet -     CMP/GFR  Depression, major, recurrent, in partial remission ([place]) Continue medications  Lifestyle discussed: diet/exerise, sleep hygiene, stress management, hydration  At high risk for falls/imbalance PT did follow him at home; evaluated for falls risk, concluded no need for continued therapy ***  CKD (chronic kidney disease) stage 3, GFR 30-59 ml/min (HCC) Increase fluids, avoid NSAIDS, monitor sugars, will monitor -     BASIC METABOLIC PANEL WITH GFR  Benign prostatic  hyperplasia with lower urinary tract symptoms Continue terazosin, starting finasteride today, refer to urology as needed  BMI 28  Over 30 minutes of exam, counseling, chart review, and critical decision making was performed  Future Appointments  Date Time Provider [place]  01/11/2018  3:00 PM Liane Comber, NP [facility] None  03/16/2018  3:30 PM Unk Pinto, MD [facility] None  09/29/2018  3:00 PM Unk Pinto, MD [facility] None    Plan:   During the course of the visit the patient was educated and counseled about appropriate screening and preventive services including:    Pneumococcal vaccine   Influenza vaccine  Prevnar 13  Td vaccine  Screening electrocardiogram  Colorectal cancer screening  Diabetes screening  Glaucoma screening  Nutrition counseling    Subjective:  Lance Ramirez is a 84 y.o. male who presents accompanied by his daughter for Medicare Annual Wellness Visit and 3 month follow up for HTN, hyperlipidemia, prediabetes, and vitamin D Def. has Hypertension; Hyperlipidemia; History of CVA (cerebrovascular accident); GERD (gastroesophageal reflux disease); Vitamin D deficiency; Prediabetes; Medication management; Depression, major, recurrent, in partial remission ([place]); At high risk for falls; SDAT (senile dementia of Alzheimer's type) ([place]); Benign prostatic hyperplasia with lower urinary tract symptoms; CKD (chronic kidney disease) stage 3, GFR 30-59 ml/min ([place]); Aortic atherosclerosis ([place]); and Overweight (BMI 25.0-29.9) on their problem list. Patient was recently hospitalized in May 2019 with a TIA (RLE weakness), at time which ASA was increased from 81 to 325 mg & Plavix was added by Dr Lorraine Lax, the NeuroHospitalist. Patient also has hx/o a CVA in 2012 ([facility]) and again in 2014. Patient has mild/moderate Dementia - felt likely vascular Dementia.  BMI is There is no height or weight on file  to calculate BMI., he {HAS HAS [id number]} been  working on diet and exercise. Wt Readings from Last 3 Encounters:  08/31/17 199 lb (90.3 kg)  08/04/17 192 lb 3.2 oz (87.2 kg)  07/17/17 195 lb 8.8 oz (88.7 kg)   His blood pressure has been controlled at home, today their BP is   He does workout. He denies chest pain, shortness of breath, dizziness.   He is not on cholesterol medication secondary to age. His cholesterol is not at goal. The cholesterol last visit was:  Lab Results  Component Value Date   CHOL 197 08/31/2017   HDL 54 08/31/2017   LDLCALC 124 (H) 08/31/2017   TRIG 87 08/31/2017   CHOLHDL 3.6 08/31/2017   He has been working on diet and exercise for prediabetes, and denies nausea, paresthesia of the feet, polydipsia, polyuria, visual disturbances and vomiting. Last A1C in the office was:  Lab Results  Component Value Date   HGBA1C 5.7 (H) 08/31/2017   Last GFR Lab Results  Component Value Date   GFRNONAA 33 (L) 08/31/2017    Patient is on Vitamin D supplement and at goal:    Lab Results  Component Value Date   VD25OH 78 08/31/2017      Medication Review: Current Outpatient Medications on File Prior to Visit  Medication Sig Dispense Refill  . ALPRAZolam (XANAX) 0.5 MG tablet Take 1/2-1 tab once daily as needed for severe anxiety. 30 tablet 0  . aspirin 325 MG tablet Take 1 tablet (325 mg total) by mouth daily. 30 tablet 0  . bisoprolol (ZEBETA) 10 MG tablet Take 1 tablet (10 mg total) by mouth daily. 90 tablet 0  . bisoprolol-hydrochlorothiazide (ZIAC) 10-6.25 MG tablet TAKE 1 TABLET BY MOUTH EVERY DAY 90 tablet 0  . buPROPion (WELLBUTRIN XL) 150 MG 24 hr tablet TAKE 1 TABLET EVERY MORNING FOR MOOD AND AFTER 1 MONTH INCREASE TO 300 MG DOSE 90 tablet 1  . Cholecalciferol (VITAMIN D-3) 5000 UNITS TABS Take 1 tablet by mouth 2 (two) times daily.    . citalopram (CELEXA) 40 MG tablet TAKE 1 TABLET BY MOUTH EVERY DAY 90 tablet 0  . clopidogrel (PLAVIX) 75 MG tablet TAKE 1 TABLET (75 MG TOTAL) BY MOUTH DAILY. 90  tablet 0  . finasteride (PROSCAR) 5 MG tablet TAKE 1 TABLET BY MOUTH EVERY DAY 90 tablet 1  . furosemide (LASIX) 20 MG tablet Take 1 tablet (20 mg total) by mouth daily. 90 tablet 1  . losartan (COZAAR) 100 MG tablet TAKE 1 TABLET BY MOUTH EVERY DAY 30 tablet 0  . minoxidil (LONITEN) 10 MG tablet TAKE 1/2 TO 1 TABLET DAILY FOR BLOOD PRESSURE 90 tablet 0  . potassium chloride (K-DUR) 10 MEQ tablet Take 1 tablet (10 mEq total) by mouth daily. 20 tablet 0  . tamsulosin (FLOMAX) 0.4 MG CAPS capsule Take 1 capsule at bedtime for Prostate & Urinary Frequency 90 capsule 3  . terazosin (HYTRIN) 2 MG capsule TAKE 1 CAPSULE (2 MG TOTAL) BY MOUTH AT BEDTIME. 90 capsule 0   No current facility-administered medications on file prior to visit.     Allergies: No Known Allergies  Current Problems (verified) has Hypertension; Hyperlipidemia; History of CVA (cerebrovascular accident); GERD (gastroesophageal reflux disease); Vitamin D deficiency; Prediabetes; Medication management; Depression, major, recurrent, in partial remission ([place]); At high risk for falls; SDAT (senile dementia of Alzheimer's type) ([place]); Benign prostatic hyperplasia with lower urinary tract symptoms; CKD (chronic kidney disease) stage 3, GFR 30-59 ml/min ([place]);  Aortic atherosclerosis ([place]); and Overweight (BMI 25.0-29.9) on their problem list.  Screening Tests Immunization History  Administered Date(s) Administered  . DT 11/28/2008  . Influenza, High Dose Seasonal PF 11/03/2016  . Influenza,inj,quad, With Preservative 01/28/2016  . Pneumococcal Conjugate-13 01/28/2016  . Pneumococcal Polysaccharide-23 11/28/2008  . Zoster 09/16/2006   Preventative care: Last colonoscopy: 2009, DONE  Prior vaccinations: TD or Tdap: 2010  Influenza: 2018  Pneumococcal: 2010 Prevnar13: 2017 Shingles/Zostavax: 2008  Names of Other Physician/Practitioners you currently use: 1. [facility] here for primary  care 2. eye doctor, does not regularly see, last visit 2012 3. 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 Family His family history includes Other in his father; Sudden death in his mother. Social history  He reports that he quit smoking about 39 years ago. His smoking use included cigarettes. He has a 30.00 pack-year smoking history. He quit smokeless tobacco use about 39 years ago.  His smokeless tobacco use included chew. He reports that he does not drink alcohol or use drugs.  MEDICARE WELLNESS OBJECTIVES: Physical activity:   Cardiac risk factors:   Depression/mood screen:   Depression screen [facility] 2/9 08/31/2017  Decreased Interest 0  Down, Depressed, Hopeless 0  PHQ - 2 Score 0  Altered sleeping -  Tired, decreased energy -  Change in appetite -  Feeling bad or failure about yourself  -  Trouble concentrating -  Moving slowly or fidgety/restless -  Suicidal thoughts -  PHQ-9 Score -  Difficult doing work/chores -    ADLs:  In your present state of health, do you have any difficulty performing the following activities: 08/31/2017 08/04/2017  Hearing? - N  Vision? N N  Difficulty concentrating or making decisions? Y N  Comment mild-moderate Dementia with poor ST recall -  Walking or climbing stairs? N N  Dressing or bathing? N N  Doing errands, shopping? N N  Comment - -  Some recent data might be hidden     Cognitive Testing  Alert? Yes  Normal Appearance?Yes  Oriented to person? Yes  Place?  Yes   Time? Yes  Recall of three objects?  No  Can perform simple calculations? No  Displays appropriate judgment?Yes  Can read the correct time from a watch face?Yes  EOL planning:     Objective:   There were no vitals filed for this visit. There is no height or weight on file to calculate BMI.  General appearance: alert, no distress, WD/WN, male HEENT: normocephalic, sclerae anicteric, TMs pearly, nares patent, no discharge or erythema, pharynx normal Oral cavity: MMM, no lesions Neck: supple, no lymphadenopathy, no thyromegaly, no masses Heart: RRR, normal S1, S2, no murmurs Lungs: CTA bilaterally, no wheezes, rhonchi, or rales Abdomen: +bs, soft, non tender, non distended, no masses, no hepatomegaly, no splenomegaly Musculoskeletal: nontender, no swelling, no obvious deformity Extremities: no edema, no cyanosis, no clubbing Pulses: 2+ symmetric, upper and lower extremities, normal cap refill Neurological: alert, oriented x 3, CN2-12 intact, strength normal upper extremities and lower extremities, sensation normal throughout, DTRs 2+ throughout, no cerebellar signs, gait normal  Psychiatric: normal affect, behavior normal, pleasant   Medicare Attestation I have personally reviewed: The patient's medical and social history Their use of alcohol, tobacco or illicit drugs Their current medications and supplements The patient's functional ability including ADLs,fall risks, home safety risks, cognitive, and hearing and visual impairment Diet and physical activities Evidence for depression or mood disorders  The patient's weight, height, BMI, and visual acuity have been recorded in the chart.  I have made referrals, counseling, and provided education to the patient based on review of the above and I have provided the patient with a written personalized care plan for preventive services.     Izora Ribas, NP   01/08/2018

## 2018-01-11 ENCOUNTER — Ambulatory Visit: Payer: Self-pay | Admitting: Adult Health

## 2018-01-15 ENCOUNTER — Telehealth: Payer: Self-pay | Admitting: *Deleted

## 2018-01-15 NOTE — Telephone Encounter (Signed)
Pt called pt is not able to walk not able to lift arms needs to be checked.  Advised pt ED  Patients wife aware said she will call EMS due to not able to walk with need transporting

## 2018-01-16 ENCOUNTER — Encounter (HOSPITAL_COMMUNITY): Payer: Self-pay | Admitting: Internal Medicine

## 2018-01-16 ENCOUNTER — Inpatient Hospital Stay (HOSPITAL_COMMUNITY)
Admission: EM | Admit: 2018-01-16 | Discharge: 2018-01-19 | DRG: 682 | Disposition: A | Discharge status: Home Health | Payer: PPO | Attending: Internal Medicine | Admitting: Internal Medicine

## 2018-01-16 ENCOUNTER — Emergency Department (HOSPITAL_COMMUNITY): Payer: PPO

## 2018-01-16 DIAGNOSIS — K219 Gastro-esophageal reflux disease without esophagitis: Secondary | ICD-10-CM | POA: Diagnosis not present

## 2018-01-16 DIAGNOSIS — Z79899 Other long term (current) drug therapy: Secondary | ICD-10-CM

## 2018-01-16 DIAGNOSIS — G9341 Metabolic encephalopathy: Secondary | ICD-10-CM | POA: Diagnosis present

## 2018-01-16 DIAGNOSIS — Z8673 Personal history of transient ischemic attack (TIA), and cerebral infarction without residual deficits: Secondary | ICD-10-CM

## 2018-01-16 DIAGNOSIS — Z7902 Long term (current) use of antithrombotics/antiplatelets: Secondary | ICD-10-CM

## 2018-01-16 DIAGNOSIS — E782 Mixed hyperlipidemia: Secondary | ICD-10-CM | POA: Diagnosis not present

## 2018-01-16 DIAGNOSIS — R7989 Other specified abnormal findings of blood chemistry: Secondary | ICD-10-CM | POA: Diagnosis present

## 2018-01-16 DIAGNOSIS — Z981 Arthrodesis status: Secondary | ICD-10-CM | POA: Diagnosis not present

## 2018-01-16 DIAGNOSIS — Z87891 Personal history of nicotine dependence: Secondary | ICD-10-CM

## 2018-01-16 DIAGNOSIS — N183 Chronic kidney disease, stage 3 unspecified: Secondary | ICD-10-CM | POA: Diagnosis present

## 2018-01-16 DIAGNOSIS — R531 Weakness: Secondary | ICD-10-CM | POA: Diagnosis not present

## 2018-01-16 DIAGNOSIS — M1990 Unspecified osteoarthritis, unspecified site: Secondary | ICD-10-CM | POA: Diagnosis not present

## 2018-01-16 DIAGNOSIS — R4182 Altered mental status, unspecified: Secondary | ICD-10-CM | POA: Diagnosis not present

## 2018-01-16 DIAGNOSIS — K529 Noninfective gastroenteritis and colitis, unspecified: Secondary | ICD-10-CM | POA: Diagnosis present

## 2018-01-16 DIAGNOSIS — I1310 Hypertensive heart and chronic kidney disease without heart failure, with stage 1 through stage 4 chronic kidney disease, or unspecified chronic kidney disease: Secondary | ICD-10-CM | POA: Diagnosis not present

## 2018-01-16 DIAGNOSIS — N189 Chronic kidney disease, unspecified: Secondary | ICD-10-CM | POA: Diagnosis not present

## 2018-01-16 DIAGNOSIS — D696 Thrombocytopenia, unspecified: Secondary | ICD-10-CM | POA: Diagnosis present

## 2018-01-16 DIAGNOSIS — I959 Hypotension, unspecified: Secondary | ICD-10-CM | POA: Diagnosis not present

## 2018-01-16 DIAGNOSIS — Z9181 History of falling: Secondary | ICD-10-CM | POA: Diagnosis not present

## 2018-01-16 DIAGNOSIS — N200 Calculus of kidney: Secondary | ICD-10-CM | POA: Diagnosis not present

## 2018-01-16 DIAGNOSIS — Z7982 Long term (current) use of aspirin: Secondary | ICD-10-CM

## 2018-01-16 DIAGNOSIS — I10 Essential (primary) hypertension: Secondary | ICD-10-CM

## 2018-01-16 DIAGNOSIS — E291 Testicular hypofunction: Secondary | ICD-10-CM | POA: Diagnosis not present

## 2018-01-16 DIAGNOSIS — E860 Dehydration: Secondary | ICD-10-CM | POA: Diagnosis present

## 2018-01-16 DIAGNOSIS — F0390 Unspecified dementia without behavioral disturbance: Secondary | ICD-10-CM | POA: Diagnosis present

## 2018-01-16 DIAGNOSIS — R1111 Vomiting without nausea: Secondary | ICD-10-CM | POA: Diagnosis not present

## 2018-01-16 DIAGNOSIS — N401 Enlarged prostate with lower urinary tract symptoms: Secondary | ICD-10-CM | POA: Diagnosis not present

## 2018-01-16 DIAGNOSIS — R410 Disorientation, unspecified: Secondary | ICD-10-CM | POA: Diagnosis not present

## 2018-01-16 DIAGNOSIS — E559 Vitamin D deficiency, unspecified: Secondary | ICD-10-CM | POA: Diagnosis present

## 2018-01-16 DIAGNOSIS — N179 Acute kidney failure, unspecified: Secondary | ICD-10-CM | POA: Diagnosis not present

## 2018-01-16 DIAGNOSIS — E785 Hyperlipidemia, unspecified: Secondary | ICD-10-CM | POA: Diagnosis not present

## 2018-01-16 LAB — ETHANOL

## 2018-01-16 LAB — URINALYSIS, COMPLETE (UACMP) WITH MICROSCOPIC
BILIRUBIN URINE: NEGATIVE
Bacteria, UA: NONE SEEN
GLUCOSE, UA: NEGATIVE mg/dL
Ketones, ur: NEGATIVE mg/dL
Leukocytes, UA: NEGATIVE
NITRITE: NEGATIVE
PROTEIN: NEGATIVE mg/dL
SPECIFIC GRAVITY, URINE: 1.014 (ref 1.005–1.030)
pH: 5 (ref 5.0–8.0)

## 2018-01-16 LAB — COMPREHENSIVE METABOLIC PANEL
ALT: 14 U/L (ref 0–44)
ANION GAP: 8 (ref 5–15)
AST: 16 U/L (ref 15–41)
Albumin: 3.5 g/dL (ref 3.5–5.0)
Alkaline Phosphatase: 39 U/L (ref 38–126)
BILIRUBIN TOTAL: 0.7 mg/dL (ref 0.3–1.2)
BUN: 51 mg/dL — AB (ref 8–23)
CHLORIDE: 107 mmol/L (ref 98–111)
CO2: 26 mmol/L (ref 22–32)
Calcium: 9.7 mg/dL (ref 8.9–10.3)
Creatinine, Ser: 3.87 mg/dL — ABNORMAL HIGH (ref 0.61–1.24)
GFR, EST AFRICAN AMERICAN: 15 mL/min — AB (ref >60)
GFR, EST NON AFRICAN AMERICAN: 13 mL/min — AB (ref >60)
Glucose, Bld: 146 mg/dL — ABNORMAL HIGH (ref 70–99)
Potassium: 3.8 mmol/L (ref 3.5–5.1)
Sodium: 141 mmol/L (ref 135–145)
TOTAL PROTEIN: 5.7 g/dL — AB (ref 6.5–8.1)

## 2018-01-16 LAB — CREATININE, SERUM
Creatinine, Ser: 3.67 mg/dL — ABNORMAL HIGH (ref 0.61–1.24)
GFR calc Af Amer: 16 mL/min — ABNORMAL LOW (ref >60)
GFR, EST NON AFRICAN AMERICAN: 14 mL/min — AB (ref >60)

## 2018-01-16 LAB — I-STAT CHEM 8, ED
BUN: 47 mg/dL — ABNORMAL HIGH (ref 8–23)
CALCIUM ION: 1.24 mmol/L (ref 1.15–1.40)
CHLORIDE: 106 mmol/L (ref 98–111)
CREATININE: 4.1 mg/dL — AB (ref 0.61–1.24)
GLUCOSE: 142 mg/dL — AB (ref 70–99)
HCT: 40 % (ref 39.0–52.0)
HEMOGLOBIN: 13.6 g/dL (ref 13.0–17.0)
Potassium: 3.8 mmol/L (ref 3.5–5.1)
Sodium: 142 mmol/L (ref 135–145)
TCO2: 29 mmol/L (ref 22–32)

## 2018-01-16 LAB — RAPID URINE DRUG SCREEN, HOSP PERFORMED
Amphetamines: NOT DETECTED
BENZODIAZEPINES: POSITIVE — AB
Barbiturates: NOT DETECTED
Cocaine: NOT DETECTED
Opiates: NOT DETECTED
Tetrahydrocannabinol: NOT DETECTED

## 2018-01-16 LAB — CBC WITH DIFFERENTIAL/PLATELET
ABS IMMATURE GRANULOCYTES: 0.03 10*3/uL (ref 0.00–0.07)
BASOS ABS: 0 10*3/uL (ref 0.0–0.1)
BASOS PCT: 0 %
Eosinophils Absolute: 0 10*3/uL (ref 0.0–0.5)
Eosinophils Relative: 0 %
HCT: 42 % (ref 39.0–52.0)
HEMOGLOBIN: 13.2 g/dL (ref 13.0–17.0)
IMMATURE GRANULOCYTES: 0 %
LYMPHS PCT: 10 %
Lymphs Abs: 0.9 10*3/uL (ref 0.7–4.0)
MCH: 28.2 pg (ref 26.0–34.0)
MCHC: 31.4 g/dL (ref 30.0–36.0)
MCV: 89.7 fL (ref 80.0–100.0)
MONO ABS: 0.6 10*3/uL (ref 0.1–1.0)
Monocytes Relative: 7 %
NEUTROS PCT: 83 %
NRBC: 0 % (ref 0.0–0.2)
Neutro Abs: 7.4 10*3/uL (ref 1.7–7.7)
PLATELETS: 123 10*3/uL — AB (ref 150–400)
RBC: 4.68 MIL/uL (ref 4.22–5.81)
RDW: 13.7 % (ref 11.5–15.5)
WBC: 9 10*3/uL (ref 4.0–10.5)

## 2018-01-16 LAB — SEDIMENTATION RATE: SED RATE: 3 mm/h (ref 0–16)

## 2018-01-16 LAB — CBC
HCT: 39.1 % (ref 39.0–52.0)
HEMOGLOBIN: 12.5 g/dL — AB (ref 13.0–17.0)
MCH: 28.3 pg (ref 26.0–34.0)
MCHC: 32 g/dL (ref 30.0–36.0)
MCV: 88.5 fL (ref 80.0–100.0)
Platelets: 114 10*3/uL — ABNORMAL LOW (ref 150–400)
RBC: 4.42 MIL/uL (ref 4.22–5.81)
RDW: 13.8 % (ref 11.5–15.5)
WBC: 6.7 10*3/uL (ref 4.0–10.5)
nRBC: 0 % (ref 0.0–0.2)

## 2018-01-16 LAB — C-REACTIVE PROTEIN

## 2018-01-16 LAB — TROPONIN I
TROPONIN I: 0.45 ng/mL — AB (ref <0.03)
Troponin I: 0.41 ng/mL (ref <0.03)
Troponin I: 0.38 ng/mL (ref <0.03)

## 2018-01-16 LAB — AMMONIA: Ammonia: 20 umol/L (ref 9–35)

## 2018-01-16 LAB — CK: CK TOTAL: 240 U/L (ref 49–397)

## 2018-01-16 LAB — LIPASE, BLOOD: LIPASE: 26 U/L (ref 11–51)

## 2018-01-16 LAB — I-STAT CG4 LACTIC ACID, ED: LACTIC ACID, VENOUS: 1.25 mmol/L (ref 0.5–1.9)

## 2018-01-16 MED ORDER — ASPIRIN 81 MG PO CHEW
324.0000 mg | CHEWABLE_TABLET | Freq: Once | ORAL | Status: AC
  Administered 2018-01-16: 324 mg via ORAL
  Filled 2018-01-16: qty 4

## 2018-01-16 MED ORDER — BUPROPION HCL ER (XL) 150 MG PO TB24
150.0000 mg | ORAL_TABLET | Freq: Every day | ORAL | Status: DC
  Administered 2018-01-17 – 2018-01-19 (×3): 150 mg via ORAL
  Filled 2018-01-16 (×3): qty 1

## 2018-01-16 MED ORDER — CLOPIDOGREL BISULFATE 75 MG PO TABS: 75.0000 mg | ORAL_TABLET | Freq: Every day | ORAL | Status: DC

## 2018-01-16 MED ORDER — PANTOPRAZOLE SODIUM 40 MG IV SOLR
40.0000 mg | Freq: Once | INTRAVENOUS | Status: AC
  Administered 2018-01-16: 40 mg via INTRAVENOUS
  Filled 2018-01-16: qty 40

## 2018-01-16 MED ORDER — SODIUM CHLORIDE 0.9 % IV SOLN
INTRAVENOUS | Status: DC
  Administered 2018-01-16: 17:00:00 via INTRAVENOUS

## 2018-01-16 MED ORDER — LACTATED RINGERS IV BOLUS
1000.0000 mL | Freq: Once | INTRAVENOUS | Status: AC
  Administered 2018-01-16: 1000 mL via INTRAVENOUS

## 2018-01-16 MED ORDER — ALPRAZOLAM 0.5 MG PO TABS: 0.5000 mg | ORAL_TABLET | Freq: Two times a day (BID) | ORAL | Status: DC | PRN

## 2018-01-16 MED ORDER — PANTOPRAZOLE SODIUM 40 MG PO TBEC
40.0000 mg | DELAYED_RELEASE_TABLET | Freq: Every day | ORAL | Status: DC
  Administered 2018-01-17: 40 mg via ORAL
  Filled 2018-01-16 (×2): qty 1

## 2018-01-16 MED ORDER — CITALOPRAM HYDROBROMIDE 40 MG PO TABS
40.0000 mg | ORAL_TABLET | Freq: Every day | ORAL | Status: DC
  Administered 2018-01-17 – 2018-01-19 (×3): 40 mg via ORAL
  Filled 2018-01-16 (×3): qty 1

## 2018-01-16 MED ORDER — VITAMIN D 25 MCG (1000 UNIT) PO TABS
1000.0000 [IU] | ORAL_TABLET | Freq: Two times a day (BID) | ORAL | Status: DC
  Administered 2018-01-16 – 2018-01-19 (×6): 1000 [IU] via ORAL

## 2018-01-16 MED ORDER — ACETAMINOPHEN 650 MG RE SUPP: 650.0000 mg | Freq: Four times a day (QID) | RECTAL | Status: DC | PRN

## 2018-01-16 MED ORDER — VITAMIN D-3 125 MCG (5000 UT) PO TABS: 1.0000 | ORAL_TABLET | Freq: Two times a day (BID) | ORAL | Status: DC

## 2018-01-16 MED ORDER — ACETAMINOPHEN 325 MG PO TABS
650.0000 mg | ORAL_TABLET | Freq: Four times a day (QID) | ORAL | Status: DC | PRN
  Administered 2018-01-17 – 2018-01-18 (×2): 650 mg via ORAL
  Filled 2018-01-16 (×2): qty 2

## 2018-01-16 MED ORDER — FINASTERIDE 5 MG PO TABS
5.0000 mg | ORAL_TABLET | Freq: Every day | ORAL | Status: DC
  Administered 2018-01-17 – 2018-01-19 (×3): 5 mg via ORAL
  Filled 2018-01-16 (×3): qty 1

## 2018-01-16 MED ORDER — TAMSULOSIN HCL 0.4 MG PO CAPS
0.4000 mg | ORAL_CAPSULE | Freq: Every day | ORAL | Status: DC
  Administered 2018-01-17 – 2018-01-18 (×2): 0.4 mg via ORAL
  Filled 2018-01-16 (×2): qty 1

## 2018-01-16 MED ORDER — ASPIRIN 325 MG PO TABS: 325.0000 mg | ORAL_TABLET | Freq: Every day | ORAL | Status: DC

## 2018-01-16 MED ORDER — ENOXAPARIN SODIUM 30 MG/0.3ML ~~LOC~~ SOLN
30.0000 mg | SUBCUTANEOUS | Status: DC
  Administered 2018-01-16: 30 mg via SUBCUTANEOUS
  Filled 2018-01-16: qty 0.3

## 2018-01-16 MED ORDER — ONDANSETRON HCL 4 MG/2ML IJ SOLN: 4.0000 mg | Freq: Three times a day (TID) | INTRAMUSCULAR | Status: DC | PRN

## 2018-01-16 NOTE — ED Notes (Signed)
Patient transported to X-ray 

## 2018-01-16 NOTE — ED Notes (Signed)
Pt is trying to use urinal to get UA sample
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
## 2018-01-16 NOTE — ED Triage Notes (Addendum)
Wife noticed increased confusion last night and pt needed assistance with ADLs that he is normally able to do (asking repetitive questions and needs help ambulating)  Pt woke up this morning and had 1 episode of emesis and c/o epigastric pain. Pt reports weakness. Hx dementia at baseline and CVAs. Pt states "I can't remember nothing. I can't remember anything about today. I have heart burn."

## 2018-01-16 NOTE — Progress Notes (Addendum)
Called by nursing regarding patient's daughter report.   Apparently she went over to help clean up emesis reported and reports that it was all dark blood.  Repeat Hgb is stable.  He is hemodynamically stable.  This appears to be a one time event, per report of patient's wife.  Will order IV protonix X 1 and trend CBC with AM CBC ordered.  DDx includes acute food poisoning, uncontrolled GERD, ulcer, varices (no signs/symptoms/lft elevation to suggest liver failure).  If a one time event, can consider having an outpatient endoscopy.  If H/H drop or recurs, will need to call GI consult during this hospitalization.   Gilles Chiquito, MD

## 2018-01-16 NOTE — ED Notes (Signed)
Dr. Regenia Skeeter made aware of critical troponin lab value 0.45 at 1142. No new orders at this time.

## 2018-01-16 NOTE — ED Notes (Signed)
ED Provider at bedside. 
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
## 2018-01-17 DIAGNOSIS — N189 Chronic kidney disease, unspecified: Secondary | ICD-10-CM

## 2018-01-17 DIAGNOSIS — N401 Enlarged prostate with lower urinary tract symptoms: Secondary | ICD-10-CM

## 2018-01-17 DIAGNOSIS — N179 Acute kidney failure, unspecified: Principal | ICD-10-CM

## 2018-01-17 LAB — COMPREHENSIVE METABOLIC PANEL
ALBUMIN: 2.9 g/dL — AB (ref 3.5–5.0)
ALT: 11 U/L (ref 0–44)
ANION GAP: 8 (ref 5–15)
AST: 13 U/L — ABNORMAL LOW (ref 15–41)
Alkaline Phosphatase: 30 U/L — ABNORMAL LOW (ref 38–126)
BUN: 41 mg/dL — ABNORMAL HIGH (ref 8–23)
CO2: 22 mmol/L (ref 22–32)
Calcium: 8.8 mg/dL — ABNORMAL LOW (ref 8.9–10.3)
Chloride: 111 mmol/L (ref 98–111)
Creatinine, Ser: 3.14 mg/dL — ABNORMAL HIGH (ref 0.61–1.24)
GFR calc Af Amer: 20 mL/min — ABNORMAL LOW (ref >60)
GFR calc non Af Amer: 17 mL/min — ABNORMAL LOW (ref >60)
Glucose, Bld: 95 mg/dL (ref 70–99)
POTASSIUM: 3.5 mmol/L (ref 3.5–5.1)
SODIUM: 141 mmol/L (ref 135–145)
Total Bilirubin: 0.8 mg/dL (ref 0.3–1.2)
Total Protein: 4.8 g/dL — ABNORMAL LOW (ref 6.5–8.1)

## 2018-01-17 LAB — URINE CULTURE

## 2018-01-17 LAB — CBC
HCT: 34.7 % — ABNORMAL LOW (ref 39.0–52.0)
Hemoglobin: 11.5 g/dL — ABNORMAL LOW (ref 13.0–17.0)
MCH: 29.4 pg (ref 26.0–34.0)
MCHC: 33.1 g/dL (ref 30.0–36.0)
MCV: 88.7 fL (ref 80.0–100.0)
PLATELETS: 99 10*3/uL — AB (ref 150–400)
RBC: 3.91 MIL/uL — ABNORMAL LOW (ref 4.22–5.81)
RDW: 13.9 % (ref 11.5–15.5)
WBC: 4.6 10*3/uL (ref 4.0–10.5)
nRBC: 0 % (ref 0.0–0.2)

## 2018-01-17 LAB — TROPONIN I: TROPONIN I: 0.41 ng/mL — AB (ref <0.03)

## 2018-01-17 LAB — OCCULT BLOOD X 1 CARD TO LAB, STOOL: Fecal Occult Bld: NEGATIVE

## 2018-01-17 MED ORDER — ENOXAPARIN SODIUM 40 MG/0.4ML ~~LOC~~ SOLN: 40.0000 mg | SUBCUTANEOUS | Status: DC

## 2018-01-17 MED ORDER — SODIUM CHLORIDE 0.9 % IV SOLN
INTRAVENOUS | Status: DC
  Administered 2018-01-17: 14:00:00 via INTRAVENOUS

## 2018-01-17 MED ORDER — POLYETHYLENE GLYCOL 3350 17 G PO PACK
17.0000 g | PACK | Freq: Two times a day (BID) | ORAL | Status: AC
  Administered 2018-01-17 – 2018-01-18 (×3): 17 g via ORAL
  Filled 2018-01-17 (×4): qty 1

## 2018-01-17 MED ORDER — ENOXAPARIN SODIUM 30 MG/0.3ML ~~LOC~~ SOLN
30.0000 mg | SUBCUTANEOUS | Status: DC
  Administered 2018-01-17 – 2018-01-18 (×2): 30 mg via SUBCUTANEOUS
  Filled 2018-01-17 (×2): qty 0.3

## 2018-01-17 NOTE — Progress Notes (Signed)
PROGRESS NOTE                                                                                                                                                                                                             Patient Demographics:    Lance Ramirez, is a 84 y.o. male, DOB - 1933/10/05, DQQ:[id number]  Admit date - 01/16/2018   Admitting Physician Sid Falcon, MD  Outpatient Primary MD for the patient is Unk Pinto, MD  LOS - 0   Chief Complaint  Patient presents with  . Weakness  . Altered Mental Status       Brief Narrative    84 y.o. male with medical history significant of CVA, dementia, HTN, HLD, GERD, arthritis who presents for worsening confusion and weakness, well he presents with vomiting, some documentation of coffee-ground emesis as well, to be dehydrated, with worsening creatinine to 1.1 from baseline 1.6, he was admitted for further work-up.   Subjective:    Lance Ramirez today has, No headache, No chest pain, No abdominal pain -lately he denies any nausea, no further vomiting since he presented to ED .   Assessment  & Plan :    Active Problems:   Hypertension   Hyperlipidemia   History of CVA (cerebrovascular accident)   GERD (gastroesophageal reflux disease)   At high risk for falls   Benign prostatic hyperplasia with lower urinary tract symptoms   CKD (chronic kidney disease) stage 3, GFR 30-59 ml/min (HCC)   AKI (acute kidney injury) ([facility])   Acute kidney injury superimposed on chronic kidney disease (HCC)    AKI (acute kidney injury) on CKD (chronic kidney disease) stage 3 -Exline creatinine 1.6, elevated 4.1 on presentation, most likely volume depletion his nausea and vomiting, it appears to be improving with IV fluids, I did trend down to 3.1, patient still significantly appears to be volume depleted with dry mouth and delayed skin turgor, will continue with IV fluids at 75 cc/h.  Acute  encephalopathy -Patient appears to be with baseline dementia, especially with MRI done in May showing multi-infarcts then, does appear with acute episode of confusion, most likely metabolic in the setting of his nausea, vomiting and dehydration, CT head with no acute findings -Continue to hold losartan and Lasix  Nausea/vomiting -CT abdomen pelvis with no acute findings, no recurrence during  hospital stay, there is questionable coffee-ground emesis, will monitor hemoglobin closely, for now I will hold aspirin, Plavix, and DVT prophylaxis, kidney with Protonix, so far Hemoccult negative x1, with no recurrence of vomiting or any evidence of significant GI bleed.  Elevated troponin -Any chest pain or shortness of breath, opening trend non-ACS pattern, it is chronically elevated, this is most likely in the setting of baseline CKD. -No acute EKG changes     Hypertension - Hold renally dosed medications, bisoprolol-hctz, losartan, lasix - Monitor BP and add PRN medications if SBP trending about 180    Hyperlipidemia - Continue pravastatin    History of CVA (cerebrovascular accident) - Though complained of weakness Thursday, now strength is equal and intact.  Monitor for changes,  -Recommendation on discharge on May 2019 for CVA for dual antiplatelet therapy for 3 months, then Plavix alone, is already 7 months after his discharge, will resume Plavix if hemoglobin remained stable.    GERD (gastroesophageal reflux disease) - Nausea control with PRN zofran - Protonix started     Benign prostatic hyperplasia with lower urinary tract symptoms - He is on 2 alpha blockers, will hold terazosin and keep finasteride and flomax for now to ensure good urine output.        Code Status : Full  Family Communication  : none a bedside  Disposition Plan  : pt consulted  Barriers For Discharge : Patient remains with significantly elevated creatinine, and remains with significant clinical dehydration  need of IV fluids, baseline creatinine 1.6, remains at 3.1 today, so he will need further IV fluids, PT consult, and further work-up to rule out GI bleed  Consults  : None  Procedures  : None  DVT Prophylaxis  : Subcu Lovenox  Lab Results  Component Value Date   PLT 99 (L) 01/17/2018    Antibiotics  :    Anti-infectives (From admission, onward)   None        Objective:   Vitals:   01/16/18 1530 01/16/18 1624 01/16/18 2036 01/17/18 0421  BP: (!) 110/51 (!) 144/64 (!) 125/59 (!) 152/62  Pulse: 80 85 80 79  Resp: 16 18 16 15   Temp:  98.1 F (36.7 C) 98.2 F (36.8 C) 98.1 F (36.7 C)  TempSrc:  Oral Oral Oral  SpO2: 98% 97% 98% 97%    Wt Readings from Last 3 Encounters:  08/31/17 90.3 kg  08/04/17 87.2 kg  07/17/17 88.7 kg     Intake/Output Summary (Last 24 hours) at 01/17/2018 1214 Last data filed at 01/17/2018 0900 Gross per 24 hour  Intake 411.87 ml  Output 650 ml  Net -238.13 ml     Physical Exam  Awake Alert, Oriented X 2, No new F.N deficits, Normal affect, dry oral mucosa Symmetrical Chest wall movement, Good air movement bilaterally, CTAB RRR,No Gallops,Rubs or new Murmurs, No Parasternal Heave +ve B.Sounds, Abd Soft, No tenderness, No rebound - guarding or rigidity. No Cyanosis, Clubbing or edema, No new Rash or bruise, delayed skin turgor    Data Review:    CBC Recent Labs  Lab 01/16/18 1022 01/16/18 1038 01/16/18 1632 01/17/18 0338  WBC 9.0  --  6.7 4.6  HGB 13.2 13.6 12.5* 11.5*  HCT 42.0 40.0 39.1 34.7*  PLT 123*  --  114* 99*  MCV 89.7  --  88.5 88.7  MCH 28.2  --  28.3 29.4  MCHC 31.4  --  32.0 33.1  RDW 13.7  --  13.8 13.9  LYMPHSABS 0.9  --   --   --   MONOABS 0.6  --   --   --   EOSABS 0.0  --   --   --   BASOSABS 0.0  --   --   --     Chemistries  Recent Labs  Lab 01/16/18 1022 01/16/18 1038 01/16/18 1632 01/17/18 0338  NA 141 142  --  141  K 3.8 3.8  --  3.5  CL 107 106  --  111  CO2 26  --   --  22    GLUCOSE 146* 142*  --  95  BUN 51* 47*  --  41*  CREATININE 3.87* 4.10* 3.67* 3.14*  CALCIUM 9.7  --   --  8.8*  AST 16  --   --  13*  ALT 14  --   --  11  ALKPHOS 39  --   --  30*  BILITOT 0.7  --   --  0.8   ------------------------------------------------------------------------------------------------------------------ No results for input(s): CHOL, HDL, LDLCALC, TRIG, CHOLHDL, LDLDIRECT in the last 72 hours.  Lab Results  Component Value Date   HGBA1C 5.7 (H) 08/31/2017   ------------------------------------------------------------------------------------------------------------------ No results for input(s): TSH, T4TOTAL, T3FREE, THYROIDAB in the last 72 hours.  Invalid input(s): FREET3 ------------------------------------------------------------------------------------------------------------------ No results for input(s): VITAMINB12, FOLATE, FERRITIN, TIBC, IRON, RETICCTPCT in the last 72 hours.  Coagulation profile No results for input(s): INR, PROTIME in the last 168 hours.  No results for input(s): DDIMER in the last 72 hours.  Cardiac Enzymes Recent Labs  Lab 01/16/18 1632 01/16/18 2122 01/17/18 0338  TROPONINI 0.41* 0.38* 0.41*   ------------------------------------------------------------------------------------------------------------------    Component Value Date/Time   BNP 516.4 (H) 07/16/2017 1935    Inpatient Medications  Scheduled Meds: . buPROPion  150 mg Oral Daily  . cholecalciferol  1,000 Units Oral BID  . citalopram  40 mg Oral Daily  . finasteride  5 mg Oral Daily  . pantoprazole  40 mg Oral Daily  . polyethylene glycol  17 g Oral BID  . tamsulosin  0.4 mg Oral QPC supper   Continuous Infusions: . sodium chloride     PRN Meds:.acetaminophen **OR** acetaminophen, ALPRAZolam, ondansetron (ZOFRAN) IV  Micro Results Recent Results (from the past 240 hour(s))  Urine culture     Status: Abnormal   Collection Time: 01/16/18 10:33 AM   Result Value Ref Range Status   Specimen Description URINE, RANDOM  Final   Special Requests   Final    NONE Performed at [facility], [address] [place], [place] [address]    Culture <10,000 COLONIES/mL INSIGNIFICANT GROWTH (A)  Final   Report Status 01/17/2018 FINAL  Final    Radiology Reports Ct Abdomen Pelvis Wo Contrast  Result Date: 01/16/2018 CLINICAL DATA:  84 year old male with history of confusion since last night. Emesis and epigastric pain. Weakness. EXAM: CT ABDOMEN AND PELVIS WITHOUT CONTRAST TECHNIQUE: Multidetector CT imaging of the abdomen and pelvis was performed following the standard protocol without IV contrast. COMPARISON:  None. FINDINGS: Lower chest: Severe calcifications of the mitral annulus. Atherosclerotic calcifications in the descending thoracic aorta and right coronary artery. Trace left pleural effusion lying dependently. Hepatobiliary: Multiple low-attenuation lesions throughout the liver, incompletely characterized on today's noncontrast CT examination, but statistically likely to represent cysts, largest of which measures 3.1 x 3.5 cm in segment 7 (axial image 16 of series 3). Gallbladder is normal in appearance. Pancreas: No definite pancreatic mass or peripancreatic  fluid or inflammatory changes are noted on today's noncontrast CT examination. Spleen: Unremarkable. Adrenals/Urinary Tract: 3 mm nonobstructive calculus in the interpolar collecting system of the right kidney. No additional calculi are noted within the left renal collecting system, along the course of either ureter, or within the lumen of the urinary bladder. Multiple low-attenuation lesions in both kidneys, incompletely characterized on today's noncontrast CT examination, but statistically likely to represent cysts, largest of which measures 8.5 x 5.6 cm in the interpolar region. No hydroureteronephrosis. Urinary bladder is normal in appearance. Bilateral adrenal glands are normal in  appearance. Stomach/Bowel: Unenhanced appearance of the stomach is normal. No pathologic dilatation of small bowel or colon. The appendix is not confidently identified and may be surgically absent. Regardless, there are no inflammatory changes noted adjacent to the cecum to suggest the presence of an acute appendicitis at this time. Vascular/Lymphatic: Aortic atherosclerosis, with mild fusiform aneurysmal dilatation of the infrarenal abdominal aorta which measures up to 3.5 x 2.8 cm. 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 No lymphadenopathy noted in the abdomen or pelvis. Reproductive: Prostate gland and seminal vesicles are unremarkable in appearance. Other: Small left inguinal hernia containing only fat. No significant volume of ascites. No pneumoperitoneum. Musculoskeletal: There are no aggressive appearing lytic or blastic lesions noted in the visualized portions of the skeleton. IMPRESSION: 1. No acute findings are noted in the abdomen or pelvis to account for the patient's symptoms. 2. 3 mm nonobstructive calculus in the interpolar collecting system of the right kidney. No ureteral stones or findings of urinary tract obstruction are noted at this time. 3. Aortic atherosclerosis, in addition to at least right coronary artery disease. 4. There are calcifications of the mitral valve. Echocardiographic correlation for evaluation of potential valvular dysfunction may be warranted if clinically indicated. 5. Trace left pleural effusion lying dependently. 6. Additional incidental findings, as above. Electronically Signed   By: Vinnie Langton M.D.   On: 01/16/2018 12:22   Dg Chest 2 View  Result Date: 01/16/2018 CLINICAL DATA:  Patient with confusion EXAM: CHEST - 2 VIEW COMPARISON:  Chest radiograph 07/16/2017 FINDINGS: Monitoring leads overlie the patient. Cardiomegaly. Aortic atherosclerosis. Left basilar atelectasis. No pleural effusion or pneumothorax. Thoracic spine  degenerative changes. IMPRESSION: No acute cardiopulmonary process. Electronically Signed   By: Lovey Newcomer M.D.   On: 01/16/2018 11:44   Ct Head Wo Contrast  Result Date: 01/16/2018 CLINICAL DATA:  Wife noticed increased confusion last night and pt needed assistance with ADLs that he is normally able to do (asking repetitive questions and needs help ambulating) Pt woke up this morning and had 1 episode of emesis and c/o epigastric pain. Pt reports weakness. Hx dementia at baseline and CVAs. EXAM: CT HEAD WITHOUT CONTRAST CT CERVICAL SPINE WITHOUT CONTRAST TECHNIQUE: Multidetector CT imaging of the head and cervical spine was performed following the standard protocol without intravenous contrast. Multiplanar CT image reconstructions of the cervical spine were also generated. COMPARISON:  07/16/2017 FINDINGS: CT HEAD FINDINGS Brain: No evidence of acute infarction, hemorrhage, hydrocephalus, extra-axial collection or mass lesion/mass effect. There is ventricular sulcal enlargement reflecting mild diffuse atrophy. Old lacunar infarct in the left thalamus. Patchy bilateral periventricular white matter hypoattenuation is present consistent with mild chronic microvascular ischemic change. Vascular: No hyperdense vessel or unexpected calcification. Skull: Normal. Negative for fracture or focal lesion. Sinuses/Orbits: Globes and orbits are unremarkable. Mild ethmoid sinus mucosal thickening. Sinuses otherwise clear. Other: None CT CERVICAL SPINE FINDINGS Alignment: Normal.  Skull base and vertebrae: No acute fracture. No primary bone lesion or focal pathologic process. Soft tissues and spinal canal: No prevertebral fluid or swelling. No visible canal hematoma. Disc levels: Status post anterior cervical disc fusion at C3-C4 with an anterior fusion plate and fixation screws and intervertebral disc spacer. There is mature bony fusion from C4-C5 through C6-C7. Mild spondylotic disc bulging at C2-C3. Facet degenerative  change noted bilaterally greatest at C2-C3 and, on the left, C3-C4. No convincing disc herniation. Upper chest: No acute findings. No masses or adenopathy. Clear lung apices. Other: None. IMPRESSION: HEAD CT 1. No acute intracranial abnormalities. 2. Atrophy, chronic microvascular ischemic change and old left thalamic lacunar infarct, findings stable from the prior study. CERVICAL CT 1. No fracture or acute finding. Electronically Signed   By: Lajean Manes M.D.   On: 01/16/2018 12:19   Ct Cervical Spine Wo Contrast  Result Date: 01/16/2018 CLINICAL DATA:  Wife noticed increased confusion last night and pt needed assistance with ADLs that he is normally able to do (asking repetitive questions and needs help ambulating) Pt woke up this morning and had 1 episode of emesis and c/o epigastric pain. Pt reports weakness. Hx dementia at baseline and CVAs. EXAM: CT HEAD WITHOUT CONTRAST CT CERVICAL SPINE WITHOUT CONTRAST TECHNIQUE: Multidetector CT imaging of the head and cervical spine was performed following the standard protocol without intravenous contrast. Multiplanar CT image reconstructions of the cervical spine were also generated. COMPARISON:  07/16/2017 FINDINGS: CT HEAD FINDINGS Brain: No evidence of acute infarction, hemorrhage, hydrocephalus, extra-axial collection or mass lesion/mass effect. There is ventricular sulcal enlargement reflecting mild diffuse atrophy. Old lacunar infarct in the left thalamus. Patchy bilateral periventricular white matter hypoattenuation is present consistent with mild chronic microvascular ischemic change. Vascular: No hyperdense vessel or unexpected calcification. Skull: Normal. Negative for fracture or focal lesion. Sinuses/Orbits: Globes and orbits are unremarkable. Mild ethmoid sinus mucosal thickening. Sinuses otherwise clear. Other: None CT CERVICAL SPINE FINDINGS Alignment: Normal. Skull base and vertebrae: No acute fracture. No primary bone lesion or focal pathologic  process. Soft tissues and spinal canal: No prevertebral fluid or swelling. No visible canal hematoma. Disc levels: Status post anterior cervical disc fusion at C3-C4 with an anterior fusion plate and fixation screws and intervertebral disc spacer. There is mature bony fusion from C4-C5 through C6-C7. Mild spondylotic disc bulging at C2-C3. Facet degenerative change noted bilaterally greatest at C2-C3 and, on the left, C3-C4. No convincing disc herniation. Upper chest: No acute findings. No masses or adenopathy. Clear lung apices. Other: None. IMPRESSION: HEAD CT 1. No acute intracranial abnormalities. 2. Atrophy, chronic microvascular ischemic change and old left thalamic lacunar infarct, findings stable from the prior study. CERVICAL CT 1. No fracture or acute finding. Electronically Signed   By: Lajean Manes M.D.   On: 01/16/2018 12:19    Phillips Climes M.D on 01/17/2018 at 12:14 PM  Between 7am to 7pm - Pager - [phone]  After 7pm go to [email or web address] - password [email or web address]  [facility] -  Office  [phone]

## 2018-01-18 ENCOUNTER — Other Ambulatory Visit: Payer: Self-pay | Admitting: Internal Medicine

## 2018-01-18 ENCOUNTER — Other Ambulatory Visit: Payer: Self-pay

## 2018-01-18 DIAGNOSIS — Z8673 Personal history of transient ischemic attack (TIA), and cerebral infarction without residual deficits: Secondary | ICD-10-CM | POA: Diagnosis not present

## 2018-01-18 DIAGNOSIS — Z7902 Long term (current) use of antithrombotics/antiplatelets: Secondary | ICD-10-CM | POA: Diagnosis not present

## 2018-01-18 DIAGNOSIS — Z87891 Personal history of nicotine dependence: Secondary | ICD-10-CM | POA: Diagnosis not present

## 2018-01-18 DIAGNOSIS — N179 Acute kidney failure, unspecified: Secondary | ICD-10-CM | POA: Diagnosis present

## 2018-01-18 DIAGNOSIS — F0390 Unspecified dementia without behavioral disturbance: Secondary | ICD-10-CM | POA: Diagnosis present

## 2018-01-18 DIAGNOSIS — R531 Weakness: Secondary | ICD-10-CM | POA: Diagnosis present

## 2018-01-18 DIAGNOSIS — Z9181 History of falling: Secondary | ICD-10-CM | POA: Diagnosis not present

## 2018-01-18 DIAGNOSIS — D696 Thrombocytopenia, unspecified: Secondary | ICD-10-CM | POA: Diagnosis present

## 2018-01-18 DIAGNOSIS — E559 Vitamin D deficiency, unspecified: Secondary | ICD-10-CM | POA: Diagnosis present

## 2018-01-18 DIAGNOSIS — E860 Dehydration: Secondary | ICD-10-CM | POA: Diagnosis present

## 2018-01-18 DIAGNOSIS — Z7982 Long term (current) use of aspirin: Secondary | ICD-10-CM | POA: Diagnosis not present

## 2018-01-18 DIAGNOSIS — E291 Testicular hypofunction: Secondary | ICD-10-CM | POA: Diagnosis present

## 2018-01-18 DIAGNOSIS — I1310 Hypertensive heart and chronic kidney disease without heart failure, with stage 1 through stage 4 chronic kidney disease, or unspecified chronic kidney disease: Secondary | ICD-10-CM | POA: Diagnosis present

## 2018-01-18 DIAGNOSIS — K529 Noninfective gastroenteritis and colitis, unspecified: Secondary | ICD-10-CM | POA: Diagnosis present

## 2018-01-18 DIAGNOSIS — K219 Gastro-esophageal reflux disease without esophagitis: Secondary | ICD-10-CM | POA: Diagnosis present

## 2018-01-18 DIAGNOSIS — E785 Hyperlipidemia, unspecified: Secondary | ICD-10-CM | POA: Diagnosis present

## 2018-01-18 DIAGNOSIS — Z981 Arthrodesis status: Secondary | ICD-10-CM | POA: Diagnosis not present

## 2018-01-18 DIAGNOSIS — N189 Chronic kidney disease, unspecified: Secondary | ICD-10-CM | POA: Diagnosis not present

## 2018-01-18 DIAGNOSIS — N183 Chronic kidney disease, stage 3 (moderate): Secondary | ICD-10-CM | POA: Diagnosis present

## 2018-01-18 DIAGNOSIS — N401 Enlarged prostate with lower urinary tract symptoms: Secondary | ICD-10-CM | POA: Diagnosis present

## 2018-01-18 DIAGNOSIS — G9341 Metabolic encephalopathy: Secondary | ICD-10-CM | POA: Diagnosis present

## 2018-01-18 DIAGNOSIS — M1990 Unspecified osteoarthritis, unspecified site: Secondary | ICD-10-CM | POA: Diagnosis present

## 2018-01-18 DIAGNOSIS — R7989 Other specified abnormal findings of blood chemistry: Secondary | ICD-10-CM | POA: Diagnosis present

## 2018-01-18 DIAGNOSIS — Z79899 Other long term (current) drug therapy: Secondary | ICD-10-CM | POA: Diagnosis not present

## 2018-01-18 LAB — CBC
HEMATOCRIT: 35.1 % — AB (ref 39.0–52.0)
Hemoglobin: 11.1 g/dL — ABNORMAL LOW (ref 13.0–17.0)
MCH: 28.4 pg (ref 26.0–34.0)
MCHC: 31.6 g/dL (ref 30.0–36.0)
MCV: 89.8 fL (ref 80.0–100.0)
NRBC: 0 % (ref 0.0–0.2)
Platelets: 101 10*3/uL — ABNORMAL LOW (ref 150–400)
RBC: 3.91 MIL/uL — AB (ref 4.22–5.81)
RDW: 14 % (ref 11.5–15.5)
WBC: 5 10*3/uL (ref 4.0–10.5)

## 2018-01-18 LAB — BASIC METABOLIC PANEL
ANION GAP: 3 — AB (ref 5–15)
BUN: 34 mg/dL — AB (ref 8–23)
CHLORIDE: 111 mmol/L (ref 98–111)
CO2: 28 mmol/L (ref 22–32)
Calcium: 8.3 mg/dL — ABNORMAL LOW (ref 8.9–10.3)
Creatinine, Ser: 2.57 mg/dL — ABNORMAL HIGH (ref 0.61–1.24)
GFR calc non Af Amer: 22 mL/min — ABNORMAL LOW (ref >60)
GFR, EST AFRICAN AMERICAN: 25 mL/min — AB (ref >60)
Glucose, Bld: 95 mg/dL (ref 70–99)
POTASSIUM: 3.6 mmol/L (ref 3.5–5.1)
Sodium: 142 mmol/L (ref 135–145)

## 2018-01-18 MED ORDER — PANTOPRAZOLE SODIUM 40 MG PO TBEC
40.0000 mg | DELAYED_RELEASE_TABLET | Freq: Two times a day (BID) | ORAL | Status: DC
  Administered 2018-01-18 – 2018-01-19 (×3): 40 mg via ORAL
  Filled 2018-01-18 (×3): qty 1

## 2018-01-18 MED ORDER — CLOPIDOGREL BISULFATE 75 MG PO TABS
75.0000 mg | ORAL_TABLET | Freq: Every day | ORAL | Status: DC
  Administered 2018-01-18 – 2018-01-19 (×2): 75 mg via ORAL
  Filled 2018-01-18 (×2): qty 1

## 2018-01-18 NOTE — Care Management Note (Addendum)
Case Management Note  Patient Details  Name: Lance Ramirez MRN: [id number] Date of Birth: [birth year]  Subjective/Objective:             AKI  Clara Erskine Speed (Spouse) Sheran Spine (Daughter)    [phone]      PCP: Unk Pinto  Action/Plan: Transition to home with home health services when medically stable. Pt/wife agreable to [facility] (PT). Pt with transportation to home once d/c.  Expected Discharge Date:            Expected Discharge Plan:  [place]  In-House Referral:     Discharge planning Services  CM Consult  Post Acute Care Choice:    Choice offered to:  Patient, Spouse  DME Arranged:  N/A(owns 3 walkers) DME Agency:  NA  HH Arranged:  PT,RN [place]:  [place]  Status of Service:  Completed, signed off  If discussed at [place] of Stay Meetings, dates discussed:    Additional Comments:  Sharin Mons, RN 01/18/2018, 12:45 PM

## 2018-01-18 NOTE — Progress Notes (Signed)
PROGRESS NOTE                                                                                                                                                                                                             Patient Demographics:    Lance Ramirez, is a 84 y.o. male, DOB - [birth year], NKN:[id number]  Admit date - 01/16/2018   Admitting Physician Sid Falcon, MD  Outpatient Primary MD for the patient is Unk Pinto, MD  LOS - 0   Chief Complaint  Patient presents with  . Weakness  . Altered Mental Status       Brief Narrative    84 y.o. male with medical history significant of CVA, dementia, HTN, HLD, GERD, arthritis who presents for worsening confusion and weakness, well he presents with vomiting, some documentation of coffee-ground emesis as well, to be dehydrated, with worsening creatinine to 1.1 from baseline 1.6, he was admitted for further work-up.   Subjective:    Lance Ramirez today has, No headache, No chest pain, No abdominal pain -nausea, no vomiting, good appetite, normal color bowel movement yesterday .   Assessment  & Plan :    Active Problems:   Hypertension   Hyperlipidemia   History of CVA (cerebrovascular accident)   GERD (gastroesophageal reflux disease)   At high risk for falls   Benign prostatic hyperplasia with lower urinary tract symptoms   CKD (chronic kidney disease) stage 3, GFR 30-59 ml/min (HCC)   AKI (acute kidney injury) ([place])   Acute kidney injury superimposed on chronic kidney disease (HCC)    AKI (acute kidney injury) on CKD (chronic kidney disease) stage 3 -Baseline creatinine 1.6, elevated 4.1 on presentation, most likely volume depletion his nausea and vomiting, it is improving with IV fluids, it is 2.57 today, continue with IV fluids at current rate, repeat BMP in a.m., and continue to hold nephrotoxic medications. -10 you to hold losartan and Lasix, continue with gentle  hydration  Acute encephalopathy -Patient appears to be with baseline dementia, especially with MRI done in May showing multi-infarcts then, does appear with acute episode of confusion, most likely metabolic in the setting of his nausea, vomiting and dehydration, CT head with no acute findings -Continue to hold losartan and Lasix  Nausea/vomiting -CT abdomen pelvis with no acute findings, no recurrence during hospital stay,  there is questionable coffee-ground emesis, will monitor hemoglobin closely, for now I will hold aspirin, Plavix, and DVT prophylaxis, kidney with Protonix, so far Hemoccult negative x1, with no recurrence of vomiting or any evidence of significant GI bleed.  Elevated troponin -Any chest pain or shortness of breath, opening trend non-ACS pattern, it is chronically elevated, this is most likely in the setting of baseline CKD. -No acute EKG changes   Hypertension - Hold renally dosed medications, bisoprolol-hctz, losartan, lasix - Monitor BP and add PRN medications if SBP trending about 180    Hyperlipidemia - Continue pravastatin    History of CVA (cerebrovascular accident) - Though complained of weakness Thursday, now strength is equal and intact.  Monitor for changes,  -Recommendation on discharge on May 2019 for CVA for dual antiplatelet therapy for 3 months, then Plavix alone, is already 7 months after his discharge, back on Plavix, no need for aspirin    GERD (gastroesophageal reflux disease) - Nausea control with PRN zofran - Protonix started     Benign prostatic hyperplasia with lower urinary tract symptoms - He is on 2 alpha blockers, will hold terazosin and keep finasteride and flomax for now to ensure good urine output.        Code Status : Full  Family Communication  : none a bedside  Disposition Plan  : home with home PT  Consults  : None  Procedures  : None  DVT Prophylaxis  : Subcu Lovenox  Lab Results  Component Value Date   PLT 101 (L)  01/18/2018    Antibiotics  :    Anti-infectives (From admission, onward)   None        Objective:   Vitals:   01/17/18 1451 01/17/18 2220 01/18/18 0457 01/18/18 1522  BP: (!) 147/69 (!) 153/72 (!) 153/62 (!) 164/70  Pulse: 72 76 71 67  Resp: 18 18 18 14   Temp: 98.6 F (37 C) 98 F (36.7 C) 98.1 F (36.7 C) 98.2 F (36.8 C)  TempSrc: Oral Oral Oral Oral  SpO2: 93% 95% 96% 97%  Weight:      Height:        Wt Readings from Last 3 Encounters:  01/17/18 90.3 kg  08/31/17 90.3 kg  08/04/17 87.2 kg     Intake/Output Summary (Last 24 hours) at 01/18/2018 1603 Last data filed at 01/18/2018 0929 Gross per 24 hour  Intake 1941.07 ml  Output 300 ml  Net 1641.07 ml     Physical Exam  Awake Alert, Oriented X 2, No new F.N deficits, Normal affect Symmetrical Chest wall movement, Good air movement bilaterally, CTAB RRR,No Gallops,Rubs or new Murmurs, No Parasternal Heave +ve B.Sounds, Abd Soft, No tenderness, No rebound - guarding or rigidity. No Cyanosis, Clubbing or edema, No new Rash or bruise       Data Review:    CBC Recent Labs  Lab 01/16/18 1022 01/16/18 1038 01/16/18 1632 01/17/18 0338 01/18/18 0317  WBC 9.0  --  6.7 4.6 5.0  HGB 13.2 13.6 12.5* 11.5* 11.1*  HCT 42.0 40.0 39.1 34.7* 35.1*  PLT 123*  --  114* 99* 101*  MCV 89.7  --  88.5 88.7 89.8  MCH 28.2  --  28.3 29.4 28.4  MCHC 31.4  --  32.0 33.1 31.6  RDW 13.7  --  13.8 13.9 14.0  LYMPHSABS 0.9  --   --   --   --   MONOABS 0.6  --   --   --   --  EOSABS 0.0  --   --   --   --   BASOSABS 0.0  --   --   --   --     Chemistries  Recent Labs  Lab 01/16/18 1022 01/16/18 1038 01/16/18 1632 01/17/18 0338 01/18/18 0317  NA 141 142  --  141 142  K 3.8 3.8  --  3.5 3.6  CL 107 106  --  111 111  CO2 26  --   --  22 28  GLUCOSE 146* 142*  --  95 95  BUN 51* 47*  --  41* 34*  CREATININE 3.87* 4.10* 3.67* 3.14* 2.57*  CALCIUM 9.7  --   --  8.8* 8.3*  AST 16  --   --  13*  --   ALT 14   --   --  11  --   ALKPHOS 39  --   --  30*  --   BILITOT 0.7  --   --  0.8  --    ------------------------------------------------------------------------------------------------------------------ No results for input(s): CHOL, HDL, LDLCALC, TRIG, CHOLHDL, LDLDIRECT in the last 72 hours.  Lab Results  Component Value Date   HGBA1C 5.7 (H) 08/31/2017   ------------------------------------------------------------------------------------------------------------------ No results for input(s): TSH, T4TOTAL, T3FREE, THYROIDAB in the last 72 hours.  Invalid input(s): FREET3 ------------------------------------------------------------------------------------------------------------------ No results for input(s): VITAMINB12, FOLATE, FERRITIN, TIBC, IRON, RETICCTPCT in the last 72 hours.  Coagulation profile No results for input(s): INR, PROTIME in the last 168 hours.  No results for input(s): DDIMER in the last 72 hours.  Cardiac Enzymes Recent Labs  Lab 01/16/18 1632 01/16/18 2122 01/17/18 0338  TROPONINI 0.41* 0.38* 0.41*   ------------------------------------------------------------------------------------------------------------------    Component Value Date/Time   BNP 516.4 (H) 07/16/2017 1935    Inpatient Medications  Scheduled Meds: . buPROPion  150 mg Oral Daily  . cholecalciferol  1,000 Units Oral BID  . citalopram  40 mg Oral Daily  . clopidogrel  75 mg Oral Daily  . enoxaparin (LOVENOX) injection  30 mg Subcutaneous Q24H  . finasteride  5 mg Oral Daily  . pantoprazole  40 mg Oral BID AC  . polyethylene glycol  17 g Oral BID  . tamsulosin  0.4 mg Oral QPC supper   Continuous Infusions: . sodium chloride 75 mL/hr at 01/18/18 0600   PRN Meds:.acetaminophen **OR** acetaminophen, ALPRAZolam, ondansetron (ZOFRAN) IV  Micro Results Recent Results (from the past 240 hour(s))  Urine culture     Status: Abnormal   Collection Time: 01/16/18 10:33 AM  Result Value Ref  Range Status   Specimen Description URINE, RANDOM  Final   Special Requests   Final    NONE Performed at [facility], [address] [place], [place] [address]    Culture <10,000 COLONIES/mL INSIGNIFICANT GROWTH (A)  Final   Report Status 01/17/2018 FINAL  Final    Radiology Reports Ct Abdomen Pelvis Wo Contrast  Result Date: 01/16/2018 CLINICAL DATA:  84 year old male with history of confusion since last night. Emesis and epigastric pain. Weakness. EXAM: CT ABDOMEN AND PELVIS WITHOUT CONTRAST TECHNIQUE: Multidetector CT imaging of the abdomen and pelvis was performed following the standard protocol without IV contrast. COMPARISON:  None. FINDINGS: Lower chest: Severe calcifications of the mitral annulus. Atherosclerotic calcifications in the descending thoracic aorta and right coronary artery. Trace left pleural effusion lying dependently. Hepatobiliary: Multiple low-attenuation lesions throughout the liver, incompletely characterized on today's noncontrast CT examination, but statistically likely to represent cysts, largest of which measures  3.1 x 3.5 cm in segment 7 (axial image 16 of series 3). Gallbladder is normal in appearance. Pancreas: No definite pancreatic mass or peripancreatic fluid or inflammatory changes are noted on today's noncontrast CT examination. Spleen: Unremarkable. Adrenals/Urinary Tract: 3 mm nonobstructive calculus in the interpolar collecting system of the right kidney. No additional calculi are noted within the left renal collecting system, along the course of either ureter, or within the lumen of the urinary bladder. Multiple low-attenuation lesions in both kidneys, incompletely characterized on today's noncontrast CT examination, but statistically likely to represent cysts, largest of which measures 8.5 x 5.6 cm in the interpolar region. No hydroureteronephrosis. Urinary bladder is normal in appearance. Bilateral adrenal glands are normal in appearance.  Stomach/Bowel: Unenhanced appearance of the stomach is normal. No pathologic dilatation of small bowel or colon. The appendix is not confidently identified and may be surgically absent. Regardless, there are no inflammatory changes noted adjacent to the cecum to suggest the presence of an acute appendicitis at this time. Vascular/Lymphatic: Aortic atherosclerosis, with mild fusiform aneurysmal dilatation of the infrarenal abdominal aorta which measures up to 3.5 x 2.8 cm. 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 No lymphadenopathy noted in the abdomen or pelvis. Reproductive: Prostate gland and seminal vesicles are unremarkable in appearance. Other: Small left inguinal hernia containing only fat. No significant volume of ascites. No pneumoperitoneum. Musculoskeletal: There are no aggressive appearing lytic or blastic lesions noted in the visualized portions of the skeleton. IMPRESSION: 1. No acute findings are noted in the abdomen or pelvis to account for the patient's symptoms. 2. 3 mm nonobstructive calculus in the interpolar collecting system of the right kidney. No ureteral stones or findings of urinary tract obstruction are noted at this time. 3. Aortic atherosclerosis, in addition to at least right coronary artery disease. 4. There are calcifications of the mitral valve. Echocardiographic correlation for evaluation of potential valvular dysfunction may be warranted if clinically indicated. 5. Trace left pleural effusion lying dependently. 6. Additional incidental findings, as above. Electronically Signed   By: Vinnie Langton M.D.   On: 01/16/2018 12:22   Dg Chest 2 View  Result Date: 01/16/2018 CLINICAL DATA:  Patient with confusion EXAM: CHEST - 2 VIEW COMPARISON:  Chest radiograph 07/16/2017 FINDINGS: Monitoring leads overlie the patient. Cardiomegaly. Aortic atherosclerosis. Left basilar atelectasis. No pleural effusion or pneumothorax. Thoracic spine  degenerative changes. IMPRESSION: No acute cardiopulmonary process. Electronically Signed   By: Lovey Newcomer M.D.   On: 01/16/2018 11:44   Ct Head Wo Contrast  Result Date: 01/16/2018 CLINICAL DATA:  Wife noticed increased confusion last night and pt needed assistance with ADLs that he is normally able to do (asking repetitive questions and needs help ambulating) Pt woke up this morning and had 1 episode of emesis and c/o epigastric pain. Pt reports weakness. Hx dementia at baseline and CVAs. EXAM: CT HEAD WITHOUT CONTRAST CT CERVICAL SPINE WITHOUT CONTRAST TECHNIQUE: Multidetector CT imaging of the head and cervical spine was performed following the standard protocol without intravenous contrast. Multiplanar CT image reconstructions of the cervical spine were also generated. COMPARISON:  07/16/2017 FINDINGS: CT HEAD FINDINGS Brain: No evidence of acute infarction, hemorrhage, hydrocephalus, extra-axial collection or mass lesion/mass effect. There is ventricular sulcal enlargement reflecting mild diffuse atrophy. Old lacunar infarct in the left thalamus. Patchy bilateral periventricular white matter hypoattenuation is present consistent with mild chronic microvascular ischemic change. Vascular: No hyperdense vessel or unexpected calcification. Skull: Normal. Negative for fracture  or focal lesion. Sinuses/Orbits: Globes and orbits are unremarkable. Mild ethmoid sinus mucosal thickening. Sinuses otherwise clear. Other: None CT CERVICAL SPINE FINDINGS Alignment: Normal. Skull base and vertebrae: No acute fracture. No primary bone lesion or focal pathologic process. Soft tissues and spinal canal: No prevertebral fluid or swelling. No visible canal hematoma. Disc levels: Status post anterior cervical disc fusion at C3-C4 with an anterior fusion plate and fixation screws and intervertebral disc spacer. There is mature bony fusion from C4-C5 through C6-C7. Mild spondylotic disc bulging at C2-C3. Facet degenerative  change noted bilaterally greatest at C2-C3 and, on the left, C3-C4. No convincing disc herniation. Upper chest: No acute findings. No masses or adenopathy. Clear lung apices. Other: None. IMPRESSION: HEAD CT 1. No acute intracranial abnormalities. 2. Atrophy, chronic microvascular ischemic change and old left thalamic lacunar infarct, findings stable from the prior study. CERVICAL CT 1. No fracture or acute finding. Electronically Signed   By: Lajean Manes M.D.   On: 01/16/2018 12:19   Ct Cervical Spine Wo Contrast  Result Date: 01/16/2018 CLINICAL DATA:  Wife noticed increased confusion last night and pt needed assistance with ADLs that he is normally able to do (asking repetitive questions and needs help ambulating) Pt woke up this morning and had 1 episode of emesis and c/o epigastric pain. Pt reports weakness. Hx dementia at baseline and CVAs. EXAM: CT HEAD WITHOUT CONTRAST CT CERVICAL SPINE WITHOUT CONTRAST TECHNIQUE: Multidetector CT imaging of the head and cervical spine was performed following the standard protocol without intravenous contrast. Multiplanar CT image reconstructions of the cervical spine were also generated. COMPARISON:  07/16/2017 FINDINGS: CT HEAD FINDINGS Brain: No evidence of acute infarction, hemorrhage, hydrocephalus, extra-axial collection or mass lesion/mass effect. There is ventricular sulcal enlargement reflecting mild diffuse atrophy. Old lacunar infarct in the left thalamus. Patchy bilateral periventricular white matter hypoattenuation is present consistent with mild chronic microvascular ischemic change. Vascular: No hyperdense vessel or unexpected calcification. Skull: Normal. Negative for fracture or focal lesion. Sinuses/Orbits: Globes and orbits are unremarkable. Mild ethmoid sinus mucosal thickening. Sinuses otherwise clear. Other: None CT CERVICAL SPINE FINDINGS Alignment: Normal. Skull base and vertebrae: No acute fracture. No primary bone lesion or focal pathologic  process. Soft tissues and spinal canal: No prevertebral fluid or swelling. No visible canal hematoma. Disc levels: Status post anterior cervical disc fusion at C3-C4 with an anterior fusion plate and fixation screws and intervertebral disc spacer. There is mature bony fusion from C4-C5 through C6-C7. Mild spondylotic disc bulging at C2-C3. Facet degenerative change noted bilaterally greatest at C2-C3 and, on the left, C3-C4. No convincing disc herniation. Upper chest: No acute findings. No masses or adenopathy. Clear lung apices. Other: None. IMPRESSION: HEAD CT 1. No acute intracranial abnormalities. 2. Atrophy, chronic microvascular ischemic change and old left thalamic lacunar infarct, findings stable from the prior study. CERVICAL CT 1. No fracture or acute finding. Electronically Signed   By: Lajean Manes M.D.   On: 01/16/2018 12:19    Phillips Climes M.D on 01/18/2018 at 4:03 PM  Between 7am to 7pm - Pager - [phone]  After 7pm go to [email or web address] - password [facility]  [facility] -  Office  [phone]

## 2018-01-18 NOTE — Evaluation (Signed)
Physical Therapy Evaluation Patient Details Name: Lance Ramirez MRN: [id number] DOB: 1934-02-03 Today's Date: 01/18/2018   History of Present Illness  Patient is an 84 y/o male presenting to [facility] ED on 01/15/18 with primary complaints of weakness. PMH significant for CVA, dementia, HTN, HLD, GERD, arthritis. Admitted for AKI on CKD stage 3.     Clinical Impression  Mr. Stockman is a very pleasant 84 y/o male admitted with the above listed diagnosis. Patient reporting independence at home prior to admission. Patient today requiring general min guard for safety with mobility. 2 small LOB with gait - likely due to poor attention to task, otherwise ambulating for prolonged distances while managing IV pole. Will recommend HHPT and supervision at discharge. PT to follow acutely to maximize safe and independent functional mobility prior to d/c home.     Follow Up Recommendations Home health PT;Supervision/Assistance - 24 hour    Equipment Recommendations  None recommended by PT    Recommendations for Other Services       Precautions / Restrictions Precautions Precautions: Fall Restrictions Weight Bearing Restrictions: No      Mobility  Bed Mobility Overal bed mobility: Needs Assistance Bed Mobility: Supine to Sit;Sit to Supine     Supine to sit: Min guard Sit to supine: Min guard   General bed mobility comments: Min G for safety - uses PT to pull up from [facility] bed rail without physical assist from PT; cueing for sequencing  Transfers Overall transfer level: Needs assistance Equipment used: None Transfers: Sit to/from Stand Sit to Stand: Min guard         General transfer comment: Min G for safety and immedaite standing balance  Ambulation/Gait Ambulation/Gait assistance: Min guard Gait Distance (Feet): 200 Feet Assistive device: IV Pole Gait Pattern/deviations: Step-through pattern;Decreased stride length;Drifts right/left Gait velocity: WNL   General Gait Details:  requires cueing for safe obstacle/hallway navigation; 2 LOB requiring Min A due to poor attention to task. Able to manage IV pole   Stairs            Wheelchair Mobility    Modified Rankin (Stroke Patients Only)       Balance Overall balance assessment: Needs assistance Sitting-balance support: No upper extremity supported;Feet supported Sitting balance-Leahy Scale: Good     Standing balance support: Single extremity supported;During functional activity Standing balance-Leahy Scale: Fair                               Pertinent Vitals/Pain Pain Assessment: No/denies pain    Home Living Family/patient expects to be discharged to:: Private residence Living Arrangements: Spouse/significant other Available Help at Discharge: Family;Available 24 hours/day Type of Home: House Home Access: Stairs to enter Entrance Stairs-Rails: Right Entrance Stairs-Number of Steps: 3 Home Layout: One level   Additional Comments: able to state home environment with some increased time     Prior Function Level of Independence: Independent         Comments: reports he was independent at home - unsure of accuracy due to some confusion and no family present     Hand Dominance   Dominant Hand: Right    Extremity/Trunk Assessment        Lower Extremity Assessment Lower Extremity Assessment: Generalized weakness    Cervical / Trunk Assessment Cervical / Trunk Assessment: Normal  Communication   Communication: No difficulties  Cognition Arousal/Alertness: Awake/alert Behavior During Therapy: WFL for tasks assessed/performed;Impulsive Overall Cognitive Status: No family/caregiver  present to determine baseline cognitive functioning                                 General Comments: takes increased time to answer questions; unsure of some aspects of home environment      General Comments      Exercises     Assessment/Plan    PT Assessment  Patient needs continued PT services  PT Problem List Decreased strength;Decreased balance;Decreased mobility;Decreased safety awareness       PT Treatment Interventions DME instruction;Functional mobility training;Therapeutic activities;Therapeutic exercise;Stair training;Gait training;Balance training;Patient/family education    PT Goals (Current goals can be found in the Care Plan section)  Acute Rehab PT Goals Patient Stated Goal: return home PT Goal Formulation: With patient Time For Goal Achievement: 02/01/18 Potential to Achieve Goals: Good    Frequency Min 3X/week   Barriers to discharge        Co-evaluation               AM-PAC PT "6 Clicks" Daily Activity  Outcome Measure Difficulty turning over in bed (including adjusting bedclothes, sheets and blankets)?: A Little Difficulty moving from lying on back to sitting on the side of the bed? : A Little Difficulty sitting down on and standing up from a chair with arms (e.g., wheelchair, bedside commode, etc,.)?: Unable Help needed moving to and from a bed to chair (including a wheelchair)?: A Little Help needed walking in hospital room?: A Little Help needed climbing 3-5 steps with a railing? : A Little 6 Click Score: 16    End of Session Equipment Utilized During Treatment: Gait belt Activity Tolerance: Patient tolerated treatment well Patient left: in bed;with call bell/phone within reach;with bed alarm set Nurse Communication: Mobility status PT Visit Diagnosis: Unsteadiness on feet (R26.81);Other abnormalities of gait and mobility (R26.89);Muscle weakness (generalized) (M62.81)    Time: 2174-7159 PT Time Calculation (min) (ACUTE ONLY): 16 min   Charges:   PT Evaluation $PT Eval Moderate Complexity: 1 Mod        Lanney Gins, PT, DPT Supplemental Physical Therapist 01/18/18 8:56 AM Pager: [phone] Office: [phone]

## 2018-01-19 LAB — BASIC METABOLIC PANEL
ANION GAP: 6 (ref 5–15)
BUN: 27 mg/dL — AB (ref 8–23)
CALCIUM: 8.4 mg/dL — AB (ref 8.9–10.3)
CO2: 24 mmol/L (ref 22–32)
Chloride: 111 mmol/L (ref 98–111)
Creatinine, Ser: 2.41 mg/dL — ABNORMAL HIGH (ref 0.61–1.24)
GFR calc Af Amer: 27 mL/min — ABNORMAL LOW (ref >60)
GFR calc non Af Amer: 24 mL/min — ABNORMAL LOW (ref >60)
GLUCOSE: 83 mg/dL (ref 70–99)
Potassium: 3.5 mmol/L (ref 3.5–5.1)
Sodium: 141 mmol/L (ref 135–145)

## 2018-01-19 LAB — CBC
HEMATOCRIT: 35.5 % — AB (ref 39.0–52.0)
Hemoglobin: 11.1 g/dL — ABNORMAL LOW (ref 13.0–17.0)
MCH: 28.4 pg (ref 26.0–34.0)
MCHC: 31.3 g/dL (ref 30.0–36.0)
MCV: 90.8 fL (ref 80.0–100.0)
NRBC: 0 % (ref 0.0–0.2)
PLATELETS: 103 10*3/uL — AB (ref 150–400)
RBC: 3.91 MIL/uL — ABNORMAL LOW (ref 4.22–5.81)
RDW: 14 % (ref 11.5–15.5)
WBC: 5 10*3/uL (ref 4.0–10.5)

## 2018-01-19 MED ORDER — BISOPROLOL FUMARATE 10 MG PO TABS
10.0000 mg | ORAL_TABLET | Freq: Every day | ORAL | Status: DC
  Administered 2018-01-19: 10 mg via ORAL
  Filled 2018-01-19: qty 1

## 2018-01-19 MED ORDER — PANTOPRAZOLE SODIUM 40 MG PO TBEC: 40.0000 mg | DELAYED_RELEASE_TABLET | Freq: Every day | ORAL | 0 refills | Status: DC

## 2018-01-19 NOTE — Discharge Instructions (Signed)
Follow with Primary MD McKeown, William, MD in 7 days   Get CBC, CMP, checked  by Primary MD next visit.    Activity: As tolerated with Full fall precautions use walker/cane & assistance as needed   Disposition Home    Diet: Heart Healthy , with feeding assistance and aspiration precautions.    On your next visit with your primary care physician please Get Medicines reviewed and adjusted.   Please request your Prim.MD to go over all Hospital Tests and Procedure/Radiological results at the follow up, please get all Hospital records sent to your Prim MD by signing hospital release before you go home.   If you experience worsening of your admission symptoms, develop shortness of breath, life threatening emergency, suicidal or homicidal thoughts you must seek medical attention immediately by calling 911 or calling your MD immediately  if symptoms less severe.  You Must read complete instructions/literature along with all the possible adverse reactions/side effects for all the Medicines you take and that have been prescribed to you. Take any new Medicines after you have completely understood and accpet all the possible adverse reactions/side effects.   Do not drive, operating heavy machinery, perform activities at heights, swimming or participation in water activities or provide baby sitting services if your were admitted for syncope or siezures until you have seen by Primary MD or a Neurologist and advised to do so again.  Do not drive when taking Pain medications.    Do not take more than prescribed Pain, Sleep and Anxiety Medications  Special Instructions: If you have smoked or chewed Tobacco  in the last 2 yrs please stop smoking, stop any regular Alcohol  and or any Recreational drug use.  Wear Seat belts while driving.   Please note  You were cared for by a hospitalist during your hospital stay. If you have any questions about your discharge medications or the care you  received while you were in the hospital after you are discharged, you can call the unit and asked to speak with the hospitalist on call if the hospitalist that took care of you is not available. Once you are discharged, your primary care physician will handle any further medical issues. Please note that NO REFILLS for any discharge medications will be authorized once you are discharged, as it is imperative that you return to your primary care physician (or establish a relationship with a primary care physician if you do not have one) for your aftercare needs so that they can reassess your need for medications and monitor your lab values.  

## 2018-01-19 NOTE — Progress Notes (Signed)
Nsg Discharge Note  Admit Date:  01/16/2018 Discharge date: 01/19/2018   Lance Ramirez to be D/C'd Home per MD order.  AVS completed.  Copy for chart, and copy for patient signed, and dated. Patient/caregiver able to verbalize understanding.  Discharge Medication: Allergies as of 01/19/2018   No Known Allergies     Medication List    STOP taking these medications   aspirin 325 MG tablet   bisoprolol 10 MG tablet Commonly known as:  ZEBETA   furosemide 20 MG tablet Commonly known as:  LASIX   ibuprofen 200 MG tablet Commonly known as:  ADVIL,MOTRIN   potassium chloride 10 MEQ tablet Commonly known as:  K-DUR   terazosin 2 MG capsule Commonly known as:  HYTRIN     TAKE these medications   ALPRAZolam 0.5 MG tablet Commonly known as:  XANAX Take 1/2-1 tab once daily as needed for severe anxiety.   bisoprolol-hydrochlorothiazide 10-6.25 MG tablet Commonly known as:  ZIAC TAKE 1 TABLET BY MOUTH EVERY DAY   buPROPion 150 MG 24 hr tablet Commonly known as:  WELLBUTRIN XL TAKE 1 TABLET EVERY MORNING FOR MOOD AND AFTER 1 MONTH INCREASE TO 300 MG DOSE What changed:  See the new instructions.   citalopram 40 MG tablet Commonly known as:  CELEXA TAKE 1 TABLET BY MOUTH EVERY DAY   clopidogrel 75 MG tablet Commonly known as:  PLAVIX TAKE 1 TABLET (75 MG TOTAL) BY MOUTH DAILY.   finasteride 5 MG tablet Commonly known as:  PROSCAR TAKE 1 TABLET BY MOUTH EVERY DAY   minoxidil 10 MG tablet Commonly known as:  LONITEN TAKE 1/2 TO 1 TABLET DAILY FOR BLOOD PRESSURE What changed:  See the new instructions.   pantoprazole 40 MG tablet Commonly known as:  PROTONIX Take 1 tablet (40 mg total) by mouth daily.   tamsulosin 0.4 MG Caps capsule Commonly known as:  FLOMAX Take 1 capsule at bedtime for Prostate & Urinary Frequency   Vitamin D-3 125 MCG (5000 UT) Tabs Take 1 tablet by mouth 2 (two) times daily.       Discharge Assessment: Vitals:   01/18/18 2113  01/19/18 0442  BP: (!) 149/65 (!) 149/66  Pulse: 72 63  Resp: 18 18  Temp: 99 F (37.2 C) 98 F (36.7 C)  SpO2: 95% 95%   Skin clean, dry and intact without evidence of skin break down, no evidence of skin tears noted. IV catheter discontinued intact. Site without signs and symptoms of complications - no redness or edema noted at insertion site, patient denies c/o pain - only slight tenderness at site.  Dressing with slight pressure applied.  D/c Instructions-Education: Discharge instructions given to patient/family with verbalized understanding. D/c education completed with patient/family including follow up instructions, medication list, d/c activities limitations if indicated, with other d/c instructions as indicated by MD - patient able to verbalize understanding, all questions fully answered. Patient instructed to return to ED, call 911, or call MD for any changes in condition.  Patient escorted via [place], and D/C home via wife.  Hiram Comber, RN 01/19/2018 1:38 PM
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
## 2018-01-20 ENCOUNTER — Telehealth: Payer: Self-pay | Admitting: *Deleted

## 2018-01-20 NOTE — Telephone Encounter (Signed)
Called patient on 01/20/2018 , 12:00 PM in an attempt to reach the patient for a hospital follow up.   Admit date: 01/16/18 Discharge: 01/19/18   He does not  have any questions or concerns about medications from the hospital admission. The patient's medications were reviewed over the phone, they were counseled to bring in all current medications to the hospital follow up visit.   I advised the patient to call if any questions or concerns arise about the hospital admission or medications    Home health was  started in the hospital.  All questions were answered and a follow up appointment was made.   Prior to Admission medications   Medication Sig Start Date End Date Taking? Authorizing Provider  ALPRAZolam Duanne Moron) 0.5 MG tablet Take 1/2-1 tab once daily as needed for severe anxiety. 01/06/18   Liane Comber, NP  bisoprolol-hydrochlorothiazide (ZIAC) 10-6.25 MG tablet TAKE 1 TABLET BY MOUTH EVERY DAY 09/03/17   Unk Pinto, MD  buPROPion (WELLBUTRIN XL) 150 MG 24 hr tablet TAKE 1 TABLET EVERY MORNING FOR MOOD AND AFTER 1 MONTH INCREASE TO 300 MG DOSE Patient taking differently: Take 150 mg by mouth See admin instructions. Take 1 tablet every morning for Mood and after 1 month increase to 300 mg dose 11/25/17   Liane Comber, NP  Cholecalciferol (VITAMIN D-3) 5000 UNITS TABS Take 1 tablet by mouth 2 (two) times daily.    [provider]  citalopram (CELEXA) 40 MG tablet TAKE 1 TABLET BY MOUTH EVERY DAY 01/08/18   Unk Pinto, MD  clopidogrel (PLAVIX) 75 MG tablet TAKE 1 TABLET (75 MG TOTAL) BY MOUTH DAILY. 12/03/17   Unk Pinto, MD  finasteride (PROSCAR) 5 MG tablet TAKE 1 TABLET BY MOUTH EVERY DAY 12/06/17   Unk Pinto, MD  minoxidil (LONITEN) 10 MG tablet TAKE 1/2 TO 1 TABLET DAILY FOR BLOOD PRESSURE Patient taking differently: Take 5-10 mg by mouth daily. TAKE 1/2 TO 1 TABLET DAILY FOR BLOOD PRESSURE 11/05/17   Unk Pinto, MD  pantoprazole (PROTONIX) 40 MG  tablet Take 1 tablet (40 mg total) by mouth daily. 01/19/18   Elgergawy, Silver Huguenin, MD  tamsulosin (FLOMAX) 0.4 MG CAPS capsule Take 1 capsule at bedtime for Prostate & Urinary Frequency 08/31/17   Unk Pinto, MD

## 2018-01-20 NOTE — Telephone Encounter (Signed)
Patient has follow up office visit. Knows to call with any questions or concerns.   

## 2018-01-21 ENCOUNTER — Other Ambulatory Visit: Payer: Self-pay

## 2018-01-21 NOTE — Patient Outreach (Signed)
[facility]) Care Management  [place]  01/21/2018  Benton Tooker June 16, 1933 [id number]  Reason for referral: 57 post discharge medication review  Unsuccessful telephone call attempt # 1 to patient.   HIPAA compliant voicemail left requesting a return call.  Plan:  I will make another outreach attempt to patient within 3-4 business days.  Joetta Manners, PharmD Clinical Pharmacist [place] [phone]

## 2018-01-22 ENCOUNTER — Ambulatory Visit: Payer: Self-pay | Admitting: Adult Health Nurse Practitioner

## 2018-01-25 ENCOUNTER — Telehealth: Payer: Self-pay | Admitting: Internal Medicine

## 2018-01-25 ENCOUNTER — Ambulatory Visit (INDEPENDENT_AMBULATORY_CARE_PROVIDER_SITE_OTHER): Payer: PPO | Admitting: Adult Health

## 2018-01-25 ENCOUNTER — Encounter: Payer: Self-pay | Admitting: Adult Health

## 2018-01-25 VITALS — BP 150/78 | HR 72 | Temp 96.8°F | Ht 70.0 in | Wt 204.0 lb

## 2018-01-25 DIAGNOSIS — I10 Essential (primary) hypertension: Secondary | ICD-10-CM | POA: Diagnosis not present

## 2018-01-25 DIAGNOSIS — Z23 Encounter for immunization: Secondary | ICD-10-CM

## 2018-01-25 DIAGNOSIS — N179 Acute kidney failure, unspecified: Secondary | ICD-10-CM | POA: Diagnosis not present

## 2018-01-25 DIAGNOSIS — Z79899 Other long term (current) drug therapy: Secondary | ICD-10-CM

## 2018-01-25 DIAGNOSIS — N189 Chronic kidney disease, unspecified: Secondary | ICD-10-CM | POA: Diagnosis not present

## 2018-01-25 DIAGNOSIS — N183 Chronic kidney disease, stage 3 unspecified: Secondary | ICD-10-CM

## 2018-01-25 DIAGNOSIS — G9340 Encephalopathy, unspecified: Secondary | ICD-10-CM | POA: Diagnosis not present

## 2018-01-25 LAB — CBC WITH DIFFERENTIAL/PLATELET
Basophils Absolute: 40 cells/uL (ref 0–200)
Basophils Relative: 0.8 %
EOS ABS: 100 {cells}/uL (ref 15–500)
Eosinophils Relative: 2 %
HCT: 40.3 % (ref 38.5–50.0)
Hemoglobin: 13.6 g/dL (ref 13.2–17.1)
Lymphs Abs: 905 cells/uL (ref 850–3900)
MCH: 29.6 pg (ref 27.0–33.0)
MCHC: 33.7 g/dL (ref 32.0–36.0)
MCV: 87.8 fL (ref 80.0–100.0)
MONOS PCT: 7.8 %
MPV: 12.3 fL (ref 7.5–12.5)
NEUTROS PCT: 71.3 %
Neutro Abs: 3565 cells/uL (ref 1500–7800)
PLATELETS: 171 10*3/uL (ref 140–400)
RBC: 4.59 10*6/uL (ref 4.20–5.80)
RDW: 13.6 % (ref 11.0–15.0)
TOTAL LYMPHOCYTE: 18.1 %
WBC: 5 10*3/uL (ref 3.8–10.8)
WBCMIX: 390 {cells}/uL (ref 200–950)

## 2018-01-25 LAB — BASIC METABOLIC PANEL WITH GFR
BUN / CREAT RATIO: 13 (calc) (ref 6–22)
BUN: 28 mg/dL — ABNORMAL HIGH (ref 7–25)
CHLORIDE: 107 mmol/L (ref 98–110)
CO2: 29 mmol/L (ref 20–32)
Calcium: 9.2 mg/dL (ref 8.6–10.3)
Creat: 2.22 mg/dL — ABNORMAL HIGH (ref 0.70–1.11)
GFR, EST AFRICAN AMERICAN: 31 mL/min/{1.73_m2} — AB (ref >=60)
GFR, Est Non African American: 27 mL/min/{1.73_m2} — ABNORMAL LOW (ref >=60)
Glucose, Bld: 108 mg/dL — ABNORMAL HIGH (ref 65–99)
Potassium: 4.3 mmol/L (ref 3.5–5.3)
Sodium: 142 mmol/L (ref 135–146)

## 2018-01-25 NOTE — Telephone Encounter (Signed)
error 

## 2018-01-25 NOTE — Progress Notes (Signed)
Hospital follow up  Assessment and Plan: Hospital visit follow up for:   [facility] was seen today for hospitalization follow-up.  Diagnoses and all orders for this visit:  Acute kidney injury superimposed on chronic kidney disease ([place]) Increase fluids, avoid NSAIDS, monitor sugars, will monitor closely Discussed goal of increasing fluid intake to 65+ fluid ounces (double current intake) and discussed strategies with patient and wife to accomplish this goal  Will follow up closely in 1 month for recheck and AWV -     BASIC METABOLIC PANEL WITH GFR  CKD (chronic kidney disease) stage 3, GFR 30-59 ml/min (HCC) -     BASIC METABOLIC PANEL WITH GFR  Essential hypertension Mildly elevated today, but in consideration of age and recent CKD will continue to monitor  Monitor blood pressure at home; call if consistently over 150/90 Continue DASH diet.   Reminder to go to the ER if any CP, SOB, nausea, dizziness, severe HA, changes vision/speech, left arm numbness and tingling and jaw pain.  Medication management -     CBC with Differential/Platelet -     BASIC METABOLIC PANEL WITH GFR  Acute encephalopathy Resolved; in-paitent workup was unremarkable; follow up on labs -     CBC with Differential/Platelet -     BASIC METABOLIC PANEL WITH GFR  Need for immunization against influenza -     Flu vaccine HIGH DOSE PF  All medications were reviewed with patient and family and fully reconciled. All questions answered fully, and patient and family members were encouraged to call the office with any further questions or concerns. Discussed goal to avoid readmission related to this diagnosis.   Over 40 minutes of exam, counseling, chart review, and complex, high/moderate level critical decision making was performed this visit.   Future Appointments  Date Time Provider [place]  01/26/2018  9:00 AM Dionne Milo, [facility] None  02/09/2018  2:00 PM Garnet Sierras, Lance Ramirez  [facility] None  05/10/2018  2:30 PM Unk Pinto, MD [facility] None  09/29/2018  3:00 PM Unk Pinto, MD [facility] None      HPI 84 y.o.male presents for follow up for transition from recent hospitalization or SNIF stay. Admit date to the hospital was 01/16/18, patient was discharged from the hospital on 01/19/18 and our clinical staff contacted the office the day after discharge to set up a follow up appointment. The discharge summary, medications, and diagnostic test results were reviewed before meeting with the patient. The patient was admitted for: Acute kidney injury superimposed on chronic kidney disease ([place]) [N17.9, N18.9]  Lance Autryis a 84 y.o.malewith medical history significant ofCVA, dementia, HTN, HLD, GERD, arthritis who presented to ED for worsening confusion and weakness. Per the wife, the patient apparently demonstrated acute confusion, it was like he could not understand her when she spoke, but could hear fine. He then became very weak and was insistent on going to bed. He had to be drug to the bedroom due to severe weakness and then crawled in to bed. The severe weakness has resolved, but the confusion persisted. He then developed very sharp epigastric pain, was given 2 tums, then he proceeded to have 2 episodes of brown emesis though without obvious blood. In the ED, he was found to have AKI on CKD with Cr of 3.87 and BUN of 51. He had a normal lipase and LFTs. He had a Troponin of 0.45, previous was 0.37 from May of this year. CBC was relatively normal with some mildly low platelets.  UA showed small Hgb, 0-5 red cells. He was admitted for further work-up.  AKI (acute kidney injury)onCKD (chronic kidney disease) stage 3 -Baselinecreatinine 1.6, elevated 4.1 on presentation, most likely volume depletion his nausea and vomiting, imminent use of NSAIDs, losartan and Lasix, proving with IV fluids; Cr 2.4 at date of discharge, trending down. Was discharged  with recommendation to repeat BMP in 1 week which will be obtained today.  -continue  to hold losartan and Lasix on discharge  -wife has been advised no further NSAIDs  Acute encephalopathy -Baseline dementia, with acute episode of confusion, most likely metabolic in the setting of his nausea, vomiting and dehydration, CT head with no acute findings -Resolved, back to baseline  Nausea/vomiting -CT abdomen pelvis with no acute findings, no recurrence during hospital stay, there is questionable coffee-ground emesis, most likely related to gastroenteritis, no recurrence during hospital stay, tolerated his diet with no complaints of nausea vomiting or abdominal pain -There is no evidence for GI bleed, hgb trended down slightly on admission secondary to IV hydration, but no evidence of GI bleed, normal color stools, Hemoccult negative, - no further episodes per patient and wife  Elevated troponin -Any chest pain or shortness of breath, opening trend non-ACS pattern, it is chronically elevated, this is most likely in the setting of baseline CKD. -No acute EKG changes  Hypertension -Blood pressure was soft initially, then started to increase, resumed back on bisoprolol/hydrochlorothiazide and minoxidil, losartan, and Lasix was held on discharge - presents today taking full tab ziac, full 10 mg minoxidil, but admits hasn't been checking BP  History of CVA (cerebrovascular accident) - neuro exam normal in hospital  -Recommendation on discharge on May 2019 for CVA for dual antiplatelet therapy for 3 months, then Plavix alone, is already 7 months after his discharge,back on Plavix, no need for aspirin, stopped on discharge  GERD (gastroesophageal reflux disease) - Nausea control with PRN zofran - Protonix started  Home health is involved; PT will be coming 3 days a week to work on strength and gait.   Patient reports he is much improved and feeling very well. We reviewed fluid intake  which his wife estimates as 32 fluid ounces daily. No longer taking NSAIDs, ASA, will utilize tylenol. He admits to forgetting to drink water, but otherwise they report they are manging well. He has some short term memory problems, but can complete ADLs independently, doesn't wander, can feed and bathe himself without difficulty. Wife coordinates medicaitons and reminds him to take. They have home help that comes in regularly to assist with housework. MMSE score today is 26/30.   We additionally discussed patient health goals and goals of care; patient reports he is ready to pass should that be God's will, but doesn't need to rush it, will continue with medications and checking labs, and continue to be seen routinely at this office. There have been multiple missed appointments over the last 6 months, but this may be due to incorrect contact information and they have not been receiving reminders.   His blood pressure has been controlled at home, today their BP is BP: (!) 150/78  He does not workout. He denies chest pain, shortness of breath, dizziness.   Images while in the hospital: Ct Abdomen Pelvis Wo Contrast  Result Date: 01/16/2018 CLINICAL DATA:  84 year old male with history of confusion since last night. Emesis and epigastric pain. Weakness. EXAM: CT ABDOMEN AND PELVIS WITHOUT CONTRAST TECHNIQUE: Multidetector CT imaging of the abdomen and pelvis was  performed following the standard protocol without IV contrast. COMPARISON:  None. FINDINGS: Lower chest: Severe calcifications of the mitral annulus. Atherosclerotic calcifications in the descending thoracic aorta and right coronary artery. Trace left pleural effusion lying dependently. Hepatobiliary: Multiple low-attenuation lesions throughout the liver, incompletely characterized on today's noncontrast CT examination, but statistically likely to represent cysts, largest of which measures 3.1 x 3.5 cm in segment 7 (axial image 16 of series 3).  Gallbladder is normal in appearance. Pancreas: No definite pancreatic mass or peripancreatic fluid or inflammatory changes are noted on today's noncontrast CT examination. Spleen: Unremarkable. Adrenals/Urinary Tract: 3 mm nonobstructive calculus in the interpolar collecting system of the right kidney. No additional calculi are noted within the left renal collecting system, along the course of either ureter, or within the lumen of the urinary bladder. Multiple low-attenuation lesions in both kidneys, incompletely characterized on today's noncontrast CT examination, but statistically likely to represent cysts, largest of which measures 8.5 x 5.6 cm in the interpolar region. No hydroureteronephrosis. Urinary bladder is normal in appearance. Bilateral adrenal glands are normal in appearance. Stomach/Bowel: Unenhanced appearance of the stomach is normal. No pathologic dilatation of small bowel or colon. The appendix is not confidently identified and may be surgically absent. Regardless, there are no inflammatory changes noted adjacent to the cecum to suggest the presence of an acute appendicitis at this time. Vascular/Lymphatic: Aortic atherosclerosis, with mild fusiform aneurysmal dilatation of the infrarenal abdominal aorta which measures up to 3.5 x 2.8 cm. 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 No lymphadenopathy noted in the abdomen or pelvis. Reproductive: Prostate gland and seminal vesicles are unremarkable in appearance. Other: Small left inguinal hernia containing only fat. No significant volume of ascites. No pneumoperitoneum. Musculoskeletal: There are no aggressive appearing lytic or blastic lesions noted in the visualized portions of the skeleton. IMPRESSION: 1. No acute findings are noted in the abdomen or pelvis to account for the patient's symptoms. 2. 3 mm nonobstructive calculus in the interpolar collecting system of the right kidney. No ureteral stones or  findings of urinary tract obstruction are noted at this time. 3. Aortic atherosclerosis, in addition to at least right coronary artery disease. 4. There are calcifications of the mitral valve. Echocardiographic correlation for evaluation of potential valvular dysfunction may be warranted if clinically indicated. 5. Trace left pleural effusion lying dependently. 6. Additional incidental findings, as above. Electronically Signed   By: Vinnie Langton M.D.   On: 01/16/2018 12:22   Dg Chest 2 View  Result Date: 01/16/2018 CLINICAL DATA:  Patient with confusion EXAM: CHEST - 2 VIEW COMPARISON:  Chest radiograph 07/16/2017 FINDINGS: Monitoring leads overlie the patient. Cardiomegaly. Aortic atherosclerosis. Left basilar atelectasis. No pleural effusion or pneumothorax. Thoracic spine degenerative changes. IMPRESSION: No acute cardiopulmonary process. Electronically Signed   By: Lovey Newcomer M.D.   On: 01/16/2018 11:44   Ct Head Wo Contrast  Result Date: 01/16/2018 CLINICAL DATA:  Wife noticed increased confusion last night and pt needed assistance with ADLs that he is normally able to do (asking repetitive questions and needs help ambulating) Pt woke up this morning and had 1 episode of emesis and c/o epigastric pain. Pt reports weakness. Hx dementia at baseline and CVAs. EXAM: CT HEAD WITHOUT CONTRAST CT CERVICAL SPINE WITHOUT CONTRAST TECHNIQUE: Multidetector CT imaging of the head and cervical spine was performed following the standard protocol without intravenous contrast. Multiplanar CT image reconstructions of the cervical spine were also generated. COMPARISON:  07/16/2017  FINDINGS: CT HEAD FINDINGS Brain: No evidence of acute infarction, hemorrhage, hydrocephalus, extra-axial collection or mass lesion/mass effect. There is ventricular sulcal enlargement reflecting mild diffuse atrophy. Old lacunar infarct in the left thalamus. Patchy bilateral periventricular white matter hypoattenuation is present  consistent with mild chronic microvascular ischemic change. Vascular: No hyperdense vessel or unexpected calcification. Skull: Normal. Negative for fracture or focal lesion. Sinuses/Orbits: Globes and orbits are unremarkable. Mild ethmoid sinus mucosal thickening. Sinuses otherwise clear. Other: None CT CERVICAL SPINE FINDINGS Alignment: Normal. Skull base and vertebrae: No acute fracture. No primary bone lesion or focal pathologic process. Soft tissues and spinal canal: No prevertebral fluid or swelling. No visible canal hematoma. Disc levels: Status post anterior cervical disc fusion at C3-C4 with an anterior fusion plate and fixation screws and intervertebral disc spacer. There is mature bony fusion from C4-C5 through C6-C7. Mild spondylotic disc bulging at C2-C3. Facet degenerative change noted bilaterally greatest at C2-C3 and, on the left, C3-C4. No convincing disc herniation. Upper chest: No acute findings. No masses or adenopathy. Clear lung apices. Other: None. IMPRESSION: HEAD CT 1. No acute intracranial abnormalities. 2. Atrophy, chronic microvascular ischemic change and old left thalamic lacunar infarct, findings stable from the prior study. CERVICAL CT 1. No fracture or acute finding. Electronically Signed   By: Lajean Manes M.D.   On: 01/16/2018 12:19   Ct Cervical Spine Wo Contrast  Result Date: 01/16/2018 CLINICAL DATA:  Wife noticed increased confusion last night and pt needed assistance with ADLs that he is normally able to do (asking repetitive questions and needs help ambulating) Pt woke up this morning and had 1 episode of emesis and c/o epigastric pain. Pt reports weakness. Hx dementia at baseline and CVAs. EXAM: CT HEAD WITHOUT CONTRAST CT CERVICAL SPINE WITHOUT CONTRAST TECHNIQUE: Multidetector CT imaging of the head and cervical spine was performed following the standard protocol without intravenous contrast. Multiplanar CT image reconstructions of the cervical spine were also  generated. COMPARISON:  07/16/2017 FINDINGS: CT HEAD FINDINGS Brain: No evidence of acute infarction, hemorrhage, hydrocephalus, extra-axial collection or mass lesion/mass effect. There is ventricular sulcal enlargement reflecting mild diffuse atrophy. Old lacunar infarct in the left thalamus. Patchy bilateral periventricular white matter hypoattenuation is present consistent with mild chronic microvascular ischemic change. Vascular: No hyperdense vessel or unexpected calcification. Skull: Normal. Negative for fracture or focal lesion. Sinuses/Orbits: Globes and orbits are unremarkable. Mild ethmoid sinus mucosal thickening. Sinuses otherwise clear. Other: None CT CERVICAL SPINE FINDINGS Alignment: Normal. Skull base and vertebrae: No acute fracture. No primary bone lesion or focal pathologic process. Soft tissues and spinal canal: No prevertebral fluid or swelling. No visible canal hematoma. Disc levels: Status post anterior cervical disc fusion at C3-C4 with an anterior fusion plate and fixation screws and intervertebral disc spacer. There is mature bony fusion from C4-C5 through C6-C7. Mild spondylotic disc bulging at C2-C3. Facet degenerative change noted bilaterally greatest at C2-C3 and, on the left, C3-C4. No convincing disc herniation. Upper chest: No acute findings. No masses or adenopathy. Clear lung apices. Other: None. IMPRESSION: HEAD CT 1. No acute intracranial abnormalities. 2. Atrophy, chronic microvascular ischemic change and old left thalamic lacunar infarct, findings stable from the prior study. CERVICAL CT 1. No fracture or acute finding. Electronically Signed   By: Lajean Manes M.D.   On: 01/16/2018 12:19    Past Medical History:  Diagnosis Date  . Arthritis   . Cerebral embolism with cerebral infarction ([place]) 02/10/2011  . Confusion   . GERD (  gastroesophageal reflux disease)   . H/O dizziness   . Hyperlipidemia   . Hypertension   . Other testicular hypofunction   . Stroke ([place])  05/2012   affected memory  . TIA (transient ischemic attack)   . Vitamin D deficiency      No Known Allergies    Current Outpatient Medications on File Prior to Visit  Medication Sig Dispense Refill  . ALPRAZolam (XANAX) 0.5 MG tablet Take 1/2-1 tab once daily as needed for severe anxiety. 30 tablet 0  . bisoprolol-hydrochlorothiazide (ZIAC) 10-6.25 MG tablet TAKE 1 TABLET BY MOUTH EVERY DAY 90 tablet 0  . buPROPion (WELLBUTRIN XL) 150 MG 24 hr tablet TAKE 1 TABLET EVERY MORNING FOR MOOD AND AFTER 1 MONTH INCREASE TO 300 MG DOSE (Patient taking differently: Take 150 mg by mouth See admin instructions. Take 1 tablet every morning for Mood and after 1 month increase to 300 mg dose) 90 tablet 1  . Cholecalciferol (VITAMIN D-3) 5000 UNITS TABS Take 1 tablet by mouth 2 (two) times daily.    . citalopram (CELEXA) 40 MG tablet TAKE 1 TABLET BY MOUTH EVERY DAY 90 tablet 0  . clopidogrel (PLAVIX) 75 MG tablet TAKE 1 TABLET (75 MG TOTAL) BY MOUTH DAILY. 90 tablet 0  . finasteride (PROSCAR) 5 MG tablet TAKE 1 TABLET BY MOUTH EVERY DAY 90 tablet 1  . minoxidil (LONITEN) 10 MG tablet TAKE 1/2 TO 1 TABLET DAILY FOR BLOOD PRESSURE (Patient taking differently: Take 5-10 mg by mouth daily. TAKE 1/2 TO 1 TABLET DAILY FOR BLOOD PRESSURE) 90 tablet 0  . pantoprazole (PROTONIX) 40 MG tablet Take 1 tablet (40 mg total) by mouth daily. 30 tablet 0  . tamsulosin (FLOMAX) 0.4 MG CAPS capsule Take 1 capsule at bedtime for Prostate & Urinary Frequency 90 capsule 3   No current facility-administered medications on file prior to visit.     ROS: all negative except above.   Physical Exam: Filed Weights   01/25/18 1509  Weight: 204 lb (92.5 kg)   BP (!) 150/78   Pulse 72   Temp (!) 96.8 F (36 C)   Ht 5\' 10"  (1.778 m)   Wt 204 lb (92.5 kg)   SpO2 98%   BMI 29.27 kg/m  General Appearance: Well nourished, in no apparent distress. Eyes: PERRLA, EOMs, conjunctiva no swelling or erythema Sinuses: No  Frontal/maxillary tenderness ENT/Mouth: Ext aud canals clear, TMs without erythema, bulging. No erythema, swelling, or exudate on post pharynx.  Tonsils not swollen or erythematous. Hearing normal.  Neck: Supple, thyroid normal.  Respiratory: Respiratory effort normal, BS equal bilaterally without rales, rhonchi, wheezing or stridor.  Cardio: RRR with no MRGs. Brisk peripheral pulses without edema.  Abdomen: Soft, + BS.  Non tender, no guarding, rebound, hernias, masses. Lymphatics: Non tender without lymphadenopathy.  Musculoskeletal: Full ROM, 5/5 strength, steady gait.  Skin: Warm, dry without rashes, lesions, ecchymosis.  Neuro: Cranial nerves intact. Normal muscle tone, no cerebellar symptoms. Sensation intact.  Psych: Awake and oriented X 3, normal affect, Insight and Judgment appropriate.     Lance Ribas, Lance Ramirez 5:30 PM [facility]

## 2018-01-25 NOTE — Patient Instructions (Signed)
Chronic Kidney Disease, Adult Chronic kidney disease (CKD) happens when the kidneys are damaged during a time of 3 or more months. The kidneys are two organs that do many important jobs in the body. These jobs include:  Removing wastes and extra fluids from the blood.  Making hormones that maintain the amount of fluid in your tissues and blood vessels.  Making sure that the body has the right amount of fluids and chemicals.  Most of the time, this condition does not go away, but it can usually be controlled. Steps must be taken to slow down the kidney damage or stop it from getting worse. Otherwise, the kidneys may stop working. Follow these instructions at home:  Follow your diet as told by your doctor. You may need to avoid alcohol, salty foods (sodium), and foods that are high in potassium, calcium, and protein.  Take over-the-counter and prescription medicines only as told by your doctor. Do not take any new medicines unless your doctor says you can do that. These include vitamins and minerals. ? Medicines and nutritional supplements can make kidney damage worse. ? Your doctor may need to change how much medicine you take.  Do not use any tobacco products. These include cigarettes, chewing tobacco, and e-cigarettes. If you need help quitting, ask your doctor.  Keep all follow-up visits as told by your doctor. This is important.  Check your blood pressure. Tell your doctor if there are changes to your blood pressure.  Get to a healthy weight. Stay at that weight. If you need help with this, ask your doctor.  Start or continue an exercise plan. Try to exercise at least 30 minutes a day, 5 days a week.  Stay up-to-date with your shots (immunizations) as told by your doctor. Contact a doctor if:  Your symptoms get worse.  You have new symptoms. Get help right away if:  You have symptoms of end-stage kidney disease. These include: ? Headaches. ? Skin that is darker or lighter  than normal. ? Numbness in your hands or feet. ? Easy bruising. ? Having hiccups often. ? Chest pain. ? Shortness of breath. ? Stopping of menstrual periods in women.  You have a fever.  You are making very little pee (urine).  You have pain or bleeding when you pee (urinate). This information is not intended to replace advice given to you by your health care provider. Make sure you discuss any questions you have with your health care provider. Document Released: 05/21/2009 Document Revised: 08/02/2015 Document Reviewed: 10/24/2011 Elsevier Interactive Patient Education  2017 [employer]  

## 2018-01-26 ENCOUNTER — Other Ambulatory Visit: Payer: Self-pay

## 2018-01-26 ENCOUNTER — Ambulatory Visit: Payer: Self-pay | Admitting: Adult Health Nurse Practitioner

## 2018-01-26 ENCOUNTER — Ambulatory Visit: Payer: Self-pay

## 2018-01-26 NOTE — Patient Outreach (Signed)
[facility]) Care Management  [place]   01/26/2018  Jawann Urbani 24-Mar-1933 [id number]  84 year old male outreached by [facility] services for a 30 day post discharge medication review.  PMHx includes, but not limited to, hypertension, GERD, dementia, benign prostatic hyperplasia, CKD Stage III, prediabetes, depression and hyperlipidemia.  Objective:  SCr 2.22 mg/dL on 01/25/18  Current Medications: Current Outpatient Medications  Medication Sig Dispense Refill  . ALPRAZolam (XANAX) 0.5 MG tablet Take 1/2-1 tab once daily as needed for severe anxiety. 30 tablet 0  . bisoprolol-hydrochlorothiazide (ZIAC) 10-6.25 MG tablet TAKE 1 TABLET BY MOUTH EVERY DAY 90 tablet 0  . buPROPion (WELLBUTRIN XL) 150 MG 24 hr tablet TAKE 1 TABLET EVERY MORNING FOR MOOD AND AFTER 1 MONTH INCREASE TO 300 MG DOSE (Patient taking differently: Take 150 mg by mouth See admin instructions. Take 300 mg daily) 90 tablet 1  . Cholecalciferol (VITAMIN D-3) 5000 UNITS TABS Take 1 tablet by mouth 2 (two) times daily.    . citalopram (CELEXA) 40 MG tablet TAKE 1 TABLET BY MOUTH EVERY DAY 90 tablet 0  . clopidogrel (PLAVIX) 75 MG tablet TAKE 1 TABLET (75 MG TOTAL) BY MOUTH DAILY. 90 tablet 0  . finasteride (PROSCAR) 5 MG tablet TAKE 1 TABLET BY MOUTH EVERY DAY 90 tablet 1  . minoxidil (LONITEN) 10 MG tablet TAKE 1/2 TO 1 TABLET DAILY FOR BLOOD PRESSURE (Patient taking differently: Take 5-10 mg by mouth daily. TAKE 1/2 TO 1 TABLET DAILY FOR BLOOD PRESSURE Patient takes 1 tablet daily. 01/26/18) 90 tablet 0  . pantoprazole (PROTONIX) 40 MG tablet Take 1 tablet (40 mg total) by mouth daily. 30 tablet 0  . tamsulosin (FLOMAX) 0.4 MG CAPS capsule Take 1 capsule at bedtime for Prostate & Urinary Frequency 90 capsule 3   No current facility-administered medications for this visit.     Functional Status: In your present state of health, do you have any difficulty performing the following  activities: 01/18/2018 08/31/2017  Hearing? [place]? N N  Difficulty concentrating or making decisions? Y Y  Comment - mild-moderate Dementia with poor ST recall  Walking or climbing stairs? Y N  Dressing or bathing? N N  Doing errands, shopping? Y N  Comment - -  Some recent data might be hidden    Fall/Depression Screening: Fall Risk  08/31/2017 08/04/2017 02/10/2017  Falls in the past year? No No No  Risk for fall due to : - - Impaired mobility   PHQ 2/9 Scores 08/31/2017 08/04/2017 02/10/2017 06/27/2016 06/27/2016 01/28/2016 06/14/2015  PHQ - 2 Score 0 0 4 0 0 0 0  PHQ- 9 Score - - 15 - - - -   ASSESSMENT: Date Discharged from Hospital: 01/19/18 Date Medication Reconciliation Performed: 01/26/2018  Medications Discontinued at Discharge:   Aspirin   Bisoprolol ([place]) listed but pt on Ziac prior to admit  Losartan per note  Furosemide  Potassium Chloride  Terazosin  Ibuprofen  New Medications at Discharge:   Pantoprazole  Patient was recently discharged from hospital and all medications have been reviewed.  Drugs sorted by system:  Neurologic/Psychologic: alprazolam, bupropion, citalopram   Cardiovascular: bisoprolol/HCTZ, clopidogrel, minoxidil  Gastrointestinal: pantorprazole  Genitourinary: finasteride, tamsulosin  Vitamins/Minerals/Supplements: cholecalciferol  Medication Review Findings:  . Losartan and Furosemide- wife states she has continued to give Mr. Lamia after discharge.  She did not understand to stop these medications. . Clopidogrel- needed refill-called into [facility] . Pravastatin- discharge note said continue, but  not on post discharge medication list . Minoxidil- patient taking 1 tablet daily- ordered as 1/2 to 1 tablet daily . Per the [employer], alprazolam is highly anticholinergic and clearance is reduced with advanced age. Risk of confusion, dry mouth, constipation and other anticholinergic effects or toxicity may occur.  There is strong  evidence to avoid use in the elderly.  Mrs. Brimage reports that they have a family friend named Elmyra Ricks who is going to start coming to their house on Tuesday, Wednesday and Thursday to help them to appointments, clean, run errands and fill pill boxes.  I spoke with Elmyra Ricks today and she is removing all their discontinued medications from the home.  She states she has helped Ms. Hritz fill their pill boxes for this week.    Plan: Route note to PCP, Dr. Melford Aase and inform him that patient had continued to receive losartan and furosemide through 01/26/18.    Determine if pravastatin should be added back to patient regimen.  Joetta Manners, PharmD Clinical Pharmacist [place] [phone]

## 2018-01-27 ENCOUNTER — Ambulatory Visit: Payer: Self-pay

## 2018-01-28 ENCOUNTER — Other Ambulatory Visit: Payer: Self-pay | Admitting: Adult Health

## 2018-01-28 ENCOUNTER — Other Ambulatory Visit: Payer: Self-pay

## 2018-01-28 ENCOUNTER — Ambulatory Visit: Payer: Self-pay

## 2018-01-28 MED ORDER — ROSUVASTATIN CALCIUM 20 MG PO TABS: 20.0000 mg | ORAL_TABLET | Freq: Every day | ORAL | 1 refills | Status: DC

## 2018-01-28 NOTE — Patient Outreach (Signed)
[facility]) Care Management  01/28/2018  Lance Ramirez 1933-07-14 [id number]  Care coordination in-basket message received from NP, Lance Ramirez.  She verified that Mr. Balingit should be taking Ziac and minoxidil for blood pressure.  She reports that he should also be on a statin due to his recent history of stroke.  She has sent in a prescription to his mail order pharmacy for rosuvastatin 20 mg daily.    Successful outreach to Lance Ramirez and their caregiver, Lance Ramirez.  Informed them that NP has sent a new prescription for rosuvastatin.  Lance Ramirez states she will add it to his pill box when received.     Plan: Close [place] [facility].  Lance Ramirez, PharmD Clinical Pharmacist [place] [phone]

## 2018-02-09 ENCOUNTER — Encounter: Payer: Self-pay | Admitting: Adult Health Nurse Practitioner

## 2018-02-09 ENCOUNTER — Ambulatory Visit (INDEPENDENT_AMBULATORY_CARE_PROVIDER_SITE_OTHER): Payer: PPO | Admitting: Adult Health Nurse Practitioner

## 2018-02-09 ENCOUNTER — Other Ambulatory Visit: Payer: Self-pay

## 2018-02-09 VITALS — BP 158/80 | HR 67 | Temp 97.3°F | Ht 70.0 in | Wt 192.0 lb

## 2018-02-09 DIAGNOSIS — K219 Gastro-esophageal reflux disease without esophagitis: Secondary | ICD-10-CM

## 2018-02-09 DIAGNOSIS — Z79899 Other long term (current) drug therapy: Secondary | ICD-10-CM | POA: Diagnosis not present

## 2018-02-09 DIAGNOSIS — R6889 Other general symptoms and signs: Secondary | ICD-10-CM

## 2018-02-09 DIAGNOSIS — N401 Enlarged prostate with lower urinary tract symptoms: Secondary | ICD-10-CM

## 2018-02-09 DIAGNOSIS — G301 Alzheimer's disease with late onset: Secondary | ICD-10-CM | POA: Diagnosis not present

## 2018-02-09 DIAGNOSIS — Z0001 Encounter for general adult medical examination with abnormal findings: Secondary | ICD-10-CM | POA: Diagnosis not present

## 2018-02-09 DIAGNOSIS — Z8673 Personal history of transient ischemic attack (TIA), and cerebral infarction without residual deficits: Secondary | ICD-10-CM | POA: Diagnosis not present

## 2018-02-09 DIAGNOSIS — F3341 Major depressive disorder, recurrent, in partial remission: Secondary | ICD-10-CM | POA: Diagnosis not present

## 2018-02-09 DIAGNOSIS — E663 Overweight: Secondary | ICD-10-CM | POA: Diagnosis not present

## 2018-02-09 DIAGNOSIS — R7303 Prediabetes: Secondary | ICD-10-CM | POA: Diagnosis not present

## 2018-02-09 DIAGNOSIS — G4700 Insomnia, unspecified: Secondary | ICD-10-CM

## 2018-02-09 DIAGNOSIS — E559 Vitamin D deficiency, unspecified: Secondary | ICD-10-CM

## 2018-02-09 DIAGNOSIS — I700 Atherosclerosis of aorta: Secondary | ICD-10-CM | POA: Diagnosis not present

## 2018-02-09 DIAGNOSIS — I10 Essential (primary) hypertension: Secondary | ICD-10-CM | POA: Diagnosis not present

## 2018-02-09 DIAGNOSIS — Z9181 History of falling: Secondary | ICD-10-CM | POA: Diagnosis not present

## 2018-02-09 DIAGNOSIS — E782 Mixed hyperlipidemia: Secondary | ICD-10-CM | POA: Diagnosis not present

## 2018-02-09 DIAGNOSIS — N183 Chronic kidney disease, stage 3 unspecified: Secondary | ICD-10-CM

## 2018-02-09 DIAGNOSIS — F0280 Dementia in other diseases classified elsewhere without behavioral disturbance: Secondary | ICD-10-CM

## 2018-02-09 MED ORDER — ALPRAZOLAM 0.5 MG PO TABS: ORAL_TABLET | ORAL | 0 refills | Status: DC

## 2018-02-09 MED ORDER — BUPROPION HCL ER (XL) 300 MG PO TB24: 300.0000 mg | ORAL_TABLET | ORAL | 4 refills | Status: DC

## 2018-02-09 NOTE — Patient Instructions (Addendum)
Change your sleeping patterns to sleep at night and to be more active during the day  Eat three meals a day.  If you are not hungry eat snacks between your meals.   It is important to take your prepared medications twice a day with food.  It is very important to shower at least every other day.  Be sure that you are drinking water all during the day    For allergy medications, try Claritin over the counter, non-drowsy.   Try using 1/2 tablet of alprazolam at night.    Check on the Bupropion dose.  We will send in 300mg  tablets, take one tablet once a day.  If you have 150mg  tablets he can take two tablet daily to use them up.  Contact the mail order to see about the Crestor (Rouvastin) 20mg  tablets.  Please let us know if we can assist with this.   We will contact you with results in 1-2 days   Safety is the largest concern.  Dementia  Dementia is a general term for problems with brain function. A person with dementia has memory loss and a hard time with at least one other brain function such as thinking, speaking, or problem solving. Dementia can affect social functioning, how you do your job, your mood, or your personality. The changes may be hidden for a long time. The earliest forms of this disease are usually not detected by family or friends.  Dementia can be:  Irreversible.  Potentially reversible.  Partially reversible.  Progressive. This means it can get worse over time. CAUSES  Irreversible dementia causes may include:  Degeneration of brain cells (Alzheimer's disease or lewy body dementia).  Multiple small strokes (vascular dementia).  Infection (chronic meningitis or Creutzfelt-Jakob disease).  Frontotemporal dementia. This affects younger people, age 31 to 35, compared to those who have Alzheimer's disease.  Dementia associated with other disorders like Parkinson's disease, Huntington's disease, or HIV-associated dementia. Potentially or partially reversible  dementia causes may include:  Medicines.  Metabolic causes such as excessive alcohol intake, vitamin B12 deficiency, or thyroid disease.  Masses or pressure in the brain such as a tumor, blood clot, or hydrocephalus. SYMPTOMS  Symptoms are often hard to detect. Family members or coworkers may not notice them early in the disease process. Different people with dementia may have different symptoms. Symptoms can include:  A hard time with memory, especially recent memory. Long-term memory may not be impaired.  Asking the same question multiple times or forgetting something someone just said.  A hard time speaking your thoughts or finding certain words.  A hard time solving problems or performing familiar tasks (such as how to use a telephone).  Sudden changes in mood.  Changes in personality, especially increasing moodiness or mistrust.  Depression.  A hard time understanding complex ideas that were never a problem in the past. DIAGNOSIS  There are no specific tests for dementia.  Your caregiver may recommend a thorough evaluation. This is because some forms of dementia can be reversible. The evaluation will likely include a physical exam and getting a detailed history from you and a family member. The history often gives the best clues and suggestions for a diagnosis.  Memory testing may be done. A detailed brain function evaluation called neuropsychologic testing may be helpful.  Lab tests and brain imaging (such as a CT scan or MRI scan) are sometimes important.  Sometimes observation and re-evaluation over time is very helpful. TREATMENT  Treatment depends on  the cause.  If the problem is a vitamin deficiency, it may be helped or cured with supplements.  For dementias such as Alzheimer's disease, medicines are available to stabilize or slow the course of the disease. There are no cures for this type of dementia.  Your caregiver can help direct you to groups, organizations, and other  caregivers to help with decisions in the care of you or your loved one. HOME CARE INSTRUCTIONS  The care of individuals with dementia is varied and dependent upon the progression of the dementia. The following suggestions are intended for the person living with, or caring for, the person with dementia.  Create a safe environment.  Remove the locks on bathroom doors to prevent the person from accidentally locking himself or herself in.  Use childproof latches on kitchen cabinets and any place where cleaning supplies, chemicals, or alcohol are kept.  Use childproof covers in unused electrical outlets.  Install childproof devices to keep doors and windows secured.  Remove stove knobs or install safety knobs and an automatic shut-off on the stove.  Lower the temperature on water heaters.  Label medicines and keep them locked up.  Secure knives, lighters, matches, power tools, and guns, and keep these items out of reach.  Keep the house free from clutter. Remove rugs or anything that might contribute to a fall.  Remove objects that might break and hurt the person.  Make sure lighting is good, both inside and outside.  Install grab rails as needed.  Use a monitoring device to alert you to falls or other needs for help.  Reduce confusion.  Keep familiar objects and people around.  Use night lights or dim lights at night.  Label items or areas.  Use reminders, notes, or directions for daily activities or tasks.  Keep a simple, consistent routine for waking, meals, bathing, dressing, and bedtime.  Create a calm, quiet environment.  Place large clocks and calendars prominently.  Display emergency numbers and home address near all telephones.  Use cues to establish different times of the day. An example is to open curtains to let the natural light in during the day.  Use effective communication.  Choose simple words and short sentences.  Use a gentle, calm tone of voice.  Be careful not to  interrupt.  If the person is struggling to find a word or communicate a thought, try to provide the word or thought.  Ask one question at a time. Allow the person ample time to answer questions. Repeat the question again if the person does not respond.  Reduce nighttime restlessness.  Provide a comfortable bed.  Have a consistent nighttime routine.  Ensure a regular walking or physical activity schedule. Involve the person in daily activities as much as possible.  Limit napping during the day.  Limit caffeine.  Attend social events that stimulate rather than overwhelm the senses.  Encourage good nutrition and hydration.  Reduce distractions during meal times and snacks.  Avoid foods that are too hot or too cold.  Monitor chewing and swallowing ability.  Continue with routine vision, hearing, dental, and medical screenings.  Only give over-the-counter or prescription medicines as directed by the caregiver.  Monitor driving abilities. Do not allow the person to drive when safe driving is no longer possible.  Register with an identification program which could provide location assistance in the event of a missing person situation. SEEK MEDICAL CARE IF:  New behavioral problems start such as moodiness, aggressiveness, or seeing things  that are not there (hallucinations).  Any new problem with brain function happens. This includes problems with balance, speech, or falling a lot.  Problems with swallowing develop.  Any symptoms of other illness happen. Small changes or worsening in any aspect of brain function can be a sign that the illness is getting worse. It can also be a sign of another medical illness such as infection. Seeing a caregiver right away is important.  SEEK IMMEDIATE MEDICAL CARE IF:  A fever develops.  New or worsened confusion develops.  New or worsened sleepiness develops.  Staying awake becomes hard to do. Document Released: 08/20/2000 Document Revised: 05/19/2011 Document  Reviewed: 07/22/2010  [facility] Patient Information 2014 [place], [place].

## 2018-02-09 NOTE — Progress Notes (Signed)
MEDICARE ANNUAL WELLNESS VISIT AND FOLLOW UP Assessment:   Diagnoses and all orders for this visit:  Encounter for Medicare annual wellness exam   [name] was seen today for medicare wellness.  Diagnoses and all orders for this visit:  CKD (chronic kidney disease) stage 3, GFR 30-59 ml/min (HCC)  Mixed hyperlipidemia  SDAT (senile dementia of Alzheimer's type) (HCC)  Gastroesophageal reflux disease, esophagitis presence not specified  Essential hypertension  Aortic atherosclerosis (HCC)  Benign prostatic hyperplasia with lower urinary tract symptoms, symptom details unspecified  Depression, major, recurrent, in partial remission ([place])  Vitamin D deficiency  Prediabetes  At high risk for falls  Overweight (BMI 25.0-29.9)  Medication management  History of CVA (cerebrovascular accident)  Insomnia, unspecified type    Essential hypertension Continue medications Monitor blood pressure at home; call if consistently over 130/80 Continue DASH diet.   Reminder to go to the ER if any CP, SOB, nausea, dizziness, severe HA, changes vision/speech, left arm numbness and tingling and jaw pain.  Gastroesophageal reflux disease, esophagitis presence not specified Well managed on current plan with PRN OTC agents Discussed diet, avoiding triggers and other lifestyle changes  SDAT (senile dementia of Alzheimer's type) Suspected vascular component per last neuro eval - continue BP treatments and plavix Patient recently relocated for safety reasons with increased agitation  Reporting poor motivation, depression- will increase celexa from 20 to 40 mg  Mixed hyperlipidemia No longer treated by statin secondary to age Continue low cholesterol diet and exercise.  Check lipid panel.  -     Lipid panel -     TSH  History of CVA (cerebrovascular accident) Continue tight control of BP Continue plavix  Vitamin D deficiency Continue supplementation -     VITAMIN D 25 Hydroxy  (Vit-D Deficiency, Fractures)  Prediabetes Discussed disease and risks Discussed diet/exercise, weight management  -     Hemoglobin A1c  Medication management -     CBC with Differential/Platelet -     BASIC METABOLIC PANEL WITH GFR -     Hepatic function panel  Depression, major, recurrent, in partial remission (HCC)  R/t maladjustment after wife's death and moving to new townhome for declining mobility and safety. Will increase celexa to 40 mg daily for continued depressive symptoms  At high risk for falls/imbalance PT did follow him at home; evaluated for falls risk, concluded no need for continued therapy  CKD (chronic kidney disease) stage 3, GFR 30-59 ml/min (HCC) Increase fluids, avoid NSAIDS, monitor sugars, will monitor -     BASIC METABOLIC PANEL WITH GFR  Benign prostatic hyperplasia with lower urinary tract symptoms Continue terazosin, starting finasteride today, refer to urology as needed  Over 30 minutes of exam, counseling, chart review, and critical decision making was performed  Future Appointments  Date Time Provider [place]  05/10/2018  2:30 PM Unk Pinto, MD [facility] None  09/29/2018  3:00 PM Unk Pinto, MD [facility] None    Plan:   During the course of the visit the patient was educated and counseled about appropriate screening and preventive services including:    Pneumococcal vaccine   Influenza vaccine  Prevnar 13  Td vaccine  Screening electrocardiogram  Colorectal cancer screening  Diabetes screening  Glaucoma screening  Nutrition counseling    Subjective:  Lance Ramirez is a 84 y.o. male who presents accompanied by his daughter for Medicare Annual Wellness Visit and 3 month follow up for HTN, hyperlipidemia, prediabetes, and vitamin D Def. has Hypertension; Hyperlipidemia; History of  CVA (cerebrovascular accident); GERD (gastroesophageal reflux disease); Vitamin D deficiency; Prediabetes; Medication management;  Depression, major, recurrent, in partial remission ([place]); At high risk for falls; SDAT (senile dementia of Alzheimer's type) ([place]); Benign prostatic hyperplasia with lower urinary tract symptoms; CKD (chronic kidney disease) stage 3, GFR 30-59 ml/min ([place]); Aortic atherosclerosis ([place]); Overweight (BMI 25.0-29.9); and Acute kidney injury superimposed on chronic kidney disease ([place]) on their problem list.   He was referred for in home PT at the last visit; when inquired how this was going neither patient nor daughter was aware- fill follow up. The patient's daughter shares concerns about patient's lifestyle and poor schedule.   His blood pressure has been controlled at home, today their BP is BP: (!) 158/80 He does workout. He denies chest pain, shortness of breath, dizziness.   He is not on cholesterol medication secondary to age. His cholesterol is not at goal. The cholesterol last visit was:  Lab Results  Component Value Date   CHOL 197 08/31/2017   HDL 54 08/31/2017   LDLCALC 124 (H) 08/31/2017   TRIG 87 08/31/2017   CHOLHDL 3.6 08/31/2017   He has been working on diet and exercise for prediabetes, and denies nausea, paresthesia of the feet, polydipsia, polyuria, visual disturbances and vomiting. Last A1C in the office was:  Lab Results  Component Value Date   HGBA1C 5.7 (H) 08/31/2017   Last GFR Lab Results  Component Value Date   GFRNONAA 27 (L) 01/25/2018    Patient is on Vitamin D supplement and near goal:    Lab Results  Component Value Date   VD25OH 78 08/31/2017      Medication Review: Current Outpatient Medications on File Prior to Visit  Medication Sig Dispense Refill  . ALPRAZolam (XANAX) 0.5 MG tablet Take 1/2-1 tab once daily as needed for severe anxiety. 30 tablet 0  . bisoprolol-hydrochlorothiazide (ZIAC) 10-6.25 MG tablet TAKE 1 TABLET BY MOUTH EVERY DAY 90 tablet 0  . buPROPion (WELLBUTRIN XL) 150 MG 24 hr tablet TAKE 1 TABLET EVERY MORNING FOR MOOD AND AFTER 1  MONTH INCREASE TO 300 MG DOSE (Patient taking differently: Take 150 mg by mouth See admin instructions. Take 300 mg daily) 90 tablet 1  . Cholecalciferol (VITAMIN D-3) 5000 UNITS TABS Take 1 tablet by mouth 2 (two) times daily.    . citalopram (CELEXA) 40 MG tablet TAKE 1 TABLET BY MOUTH EVERY DAY 90 tablet 0  . clopidogrel (PLAVIX) 75 MG tablet TAKE 1 TABLET (75 MG TOTAL) BY MOUTH DAILY. 90 tablet 0  . finasteride (PROSCAR) 5 MG tablet TAKE 1 TABLET BY MOUTH EVERY DAY 90 tablet 1  . minoxidil (LONITEN) 10 MG tablet TAKE 1/2 TO 1 TABLET DAILY FOR BLOOD PRESSURE (Patient taking differently: Take 5-10 mg by mouth daily. TAKE 1/2 TO 1 TABLET DAILY FOR BLOOD PRESSURE Patient takes 1 tablet daily. 01/26/18) 90 tablet 0  . pantoprazole (PROTONIX) 40 MG tablet Take 1 tablet (40 mg total) by mouth daily. 30 tablet 0  . tamsulosin (FLOMAX) 0.4 MG CAPS capsule Take 1 capsule at bedtime for Prostate & Urinary Frequency 90 capsule 3  . rosuvastatin (CRESTOR) 20 MG tablet Take 1 tablet (20 mg total) by mouth daily. (Patient not taking: Reported on 02/09/2018) 90 tablet 1   No current facility-administered medications on file prior to visit.     Allergies: No Known Allergies  Current Problems (verified) has Hypertension; Hyperlipidemia; History of CVA (cerebrovascular accident); GERD (gastroesophageal reflux disease); Vitamin D deficiency; Prediabetes;  Medication management; Depression, major, recurrent, in partial remission ([place]); At high risk for falls; SDAT (senile dementia of Alzheimer's type) ([place]); Benign prostatic hyperplasia with lower urinary tract symptoms; CKD (chronic kidney disease) stage 3, GFR 30-59 ml/min ([place]); Aortic atherosclerosis ([place]); Overweight (BMI 25.0-29.9); and Acute kidney injury superimposed on chronic kidney disease ([place]) on their problem list.  Screening Tests Immunization History  Administered Date(s) Administered  . DT 11/28/2008  . Influenza, High Dose Seasonal PF  11/03/2016, 01/25/2018  . Influenza,inj,quad, With Preservative 01/28/2016  . Pneumococcal Conjugate-13 01/28/2016  . Pneumococcal Polysaccharide-23 11/28/2008  . Zoster 09/16/2006   Preventative care: Last colonoscopy: 2009  Prior vaccinations: TD or Tdap: 2010  Influenza: 2018  Pneumococcal: 2010 Prevnar13: 2017 Shingles/Zostavax: 2008  Names of Other Physician/Practitioners you currently use: 1. [facility] here for primary care 2. eye doctor, does not regularly see, last visit 2012 3. 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 Family His family history includes Other in his father; Sudden death in his mother. Social history  He reports that he quit smoking about 39 years ago. His smoking use included cigarettes. He has a 30.00 pack-year smoking history. He quit smokeless tobacco use about 39 years ago.  His smokeless tobacco use included chew. He reports that he does not drink alcohol or use drugs.  MEDICARE WELLNESS OBJECTIVES: Physical activity:   Cardiac risk factors:   Depression/mood screen:   Depression screen [facility] 2/9 08/31/2017  Decreased Interest 0  Down, Depressed, Hopeless 0  PHQ - 2 Score 0  Altered sleeping -  Tired, decreased energy -  Change in appetite -  Feeling bad or failure about yourself  -  Trouble concentrating -  Moving slowly or fidgety/restless -  Suicidal thoughts -  PHQ-9 Score -  Difficult doing work/chores -    ADLs:  In  your present state of health, do you have any difficulty performing the following activities: 01/18/2018 08/31/2017  Hearing? [place]? N N  Difficulty concentrating or making decisions? Y Y  Comment - mild-moderate Dementia with poor ST recall  Walking or climbing stairs? Y N  Dressing or bathing? N N  Doing errands, shopping? Y N  Comment - -  Some recent data might be hidden     Cognitive Testing  Alert? Yes  Normal Appearance?Yes  Oriented to person? Yes  Place? Yes   Time? Yes  Recall of three objects?  No  Can perform simple calculations? No  Displays appropriate judgment?Yes  Can read the correct time from a watch face?Yes  EOL planning:     Objective:   Today's Vitals   02/09/18 1350  BP: (!) 158/80  Pulse: 67  Temp: (!) 97.3 F (36.3 C)  SpO2: 98%  Weight: 192 lb (87.1 kg)  Height: 5\' 10"  (1.778 m)   Body mass index is 27.55 kg/m.  General appearance: alert, no distress, WD/WN, male HEENT: normocephalic, sclerae anicteric, TMs pearly, nares patent, no discharge or erythema, pharynx normal Oral cavity: MMM, no lesions Neck: supple, no lymphadenopathy, no thyromegaly, no masses Heart: RRR, normal S1, S2,  no murmurs Lungs: CTA bilaterally, no wheezes, rhonchi, or rales Abdomen: +bs, soft, non tender, non distended, no masses, no hepatomegaly, no splenomegaly Musculoskeletal: nontender, no swelling, no obvious deformity Extremities: no edema, no cyanosis, no clubbing Pulses: 2+ symmetric, upper and lower extremities, normal cap refill Neurological: alert, oriented x 3, CN2-12 intact, strength normal upper extremities and lower extremities, sensation normal throughout, DTRs 2+ throughout, no cerebellar signs, gait normal Psychiatric: normal affect, behavior normal, pleasant   Medicare Attestation I have personally reviewed: The patient's medical and social history Their use of alcohol, tobacco or illicit drugs Their current medications and  supplements The patient's functional ability including ADLs,fall risks, home safety risks, cognitive, and hearing and visual impairment Diet and physical activities Evidence for depression or mood disorders  The patient's weight, height, BMI, and visual acuity have been recorded in the chart.  I have made referrals, counseling, and provided education to the patient based on review of the above and I have provided the patient with a written personalized care plan for preventive services.     Garnet Sierras, NP   02/09/2018

## 2018-02-10 LAB — COMPLETE METABOLIC PANEL WITH GFR
AG Ratio: 1.9 (calc) (ref 1.0–2.5)
ALT: 9 U/L (ref 9–46)
AST: 12 U/L (ref 10–35)
Albumin: 4.1 g/dL (ref 3.6–5.1)
Alkaline phosphatase (APISO): 53 U/L (ref 40–115)
BUN/Creatinine Ratio: 11 (calc) (ref 6–22)
Globulin: 2.2 g/dL (calc) (ref 1.9–3.7)
Potassium: 4.9 mmol/L (ref 3.5–5.3)
Sodium: 142 mmol/L (ref 135–146)
Total Bilirubin: 0.4 mg/dL (ref 0.2–1.2)

## 2018-02-10 LAB — URINALYSIS, ROUTINE W REFLEX MICROSCOPIC
Bacteria, UA: NONE SEEN /HPF
Bilirubin Urine: NEGATIVE
Glucose, UA: NEGATIVE
Hgb urine dipstick: NEGATIVE
Hyaline Cast: NONE SEEN /LPF
Ketones, ur: NEGATIVE
Nitrite: NEGATIVE
Specific Gravity, Urine: 1.016 (ref 1.001–1.03)
Squamous Epithelial / HPF: NONE SEEN /HPF (ref <=5)
pH: 6 (ref 5.0–8.0)

## 2018-02-10 LAB — LIPID PANEL
Cholesterol: 301 mg/dL — ABNORMAL HIGH (ref <200)
HDL: 53 mg/dL (ref >40)
LDL Cholesterol (Calc): 226 mg/dL — ABNORMAL HIGH
Non-HDL Cholesterol (Calc): 248 mg/dL — ABNORMAL HIGH (ref <130)
Total CHOL/HDL Ratio: 5.7 (calc) — ABNORMAL HIGH (ref <5.0)
Triglycerides: 96 mg/dL (ref <150)

## 2018-02-10 LAB — MAGNESIUM: Magnesium: 2.2 mg/dL (ref 1.5–2.5)

## 2018-02-10 LAB — COMPLETE METABOLIC PANEL WITHOUT GFR
BUN: 25 mg/dL (ref 7–25)
CO2: 28 mmol/L (ref 20–32)
Calcium: 9.7 mg/dL (ref 8.6–10.3)
Chloride: 105 mmol/L (ref 98–110)
Creat: 2.18 mg/dL — ABNORMAL HIGH (ref 0.70–1.11)
GFR, Est African American: 32 mL/min/1.73m2 — ABNORMAL LOW (ref >=60)
GFR, Est Non African American: 27 mL/min/1.73m2 — ABNORMAL LOW (ref >=60)
Glucose, Bld: 108 mg/dL — ABNORMAL HIGH (ref 65–99)
Total Protein: 6.3 g/dL (ref 6.1–8.1)

## 2018-02-10 LAB — CBC WITH DIFFERENTIAL/PLATELET
Basophils Absolute: 59 cells/uL (ref 0–200)
Basophils Relative: 0.9 %
Eosinophils Absolute: 78 cells/uL (ref 15–500)
Eosinophils Relative: 1.2 %
HCT: 46.5 % (ref 38.5–50.0)
Hemoglobin: 15.7 g/dL (ref 13.2–17.1)
Lymphs Abs: 1651 cells/uL (ref 850–3900)
MCH: 29.4 pg (ref 27.0–33.0)
MCHC: 33.8 g/dL (ref 32.0–36.0)
MCV: 87.1 fL (ref 80.0–100.0)
MPV: 12.4 fL (ref 7.5–12.5)
Monocytes Relative: 8.4 %
Neutro Abs: 4167 {cells}/uL (ref 1500–7800)
Neutrophils Relative %: 64.1 %
Platelets: 169 Thousand/uL (ref 140–400)
RBC: 5.34 Million/uL (ref 4.20–5.80)
RDW: 13.9 % (ref 11.0–15.0)
Total Lymphocyte: 25.4 %
WBC mixed population: 546 cells/uL (ref 200–950)
WBC: 6.5 Thousand/uL (ref 3.8–10.8)

## 2018-02-10 LAB — IRON, TOTAL/TOTAL IRON BINDING CAP
%SAT: 21 % (calc) (ref 20–48)
Iron: 54 ug/dL (ref 50–180)
TIBC: 262 mcg/dL (calc) (ref 250–425)

## 2018-02-10 LAB — VITAMIN B12: Vitamin B-12: 331 pg/mL (ref 200–1100)

## 2018-02-10 LAB — HEMOGLOBIN A1C
Hgb A1c MFr Bld: 5.5 % of total Hgb (ref <5.7)
Mean Plasma Glucose: 111 (calc)
eAG (mmol/L): 6.2 (calc)

## 2018-02-10 LAB — INSULIN, RANDOM: Insulin: 7.2 u[IU]/mL (ref 2.0–19.6)

## 2018-02-10 LAB — TSH: TSH: 3.69 m[IU]/L (ref 0.40–4.50)

## 2018-02-10 LAB — VITAMIN D 25 HYDROXY (VIT D DEFICIENCY, FRACTURES): Vit D, 25-Hydroxy: 86 ng/mL (ref 30–100)

## 2018-02-16 ENCOUNTER — Telehealth: Payer: Self-pay

## 2018-02-16 MED ORDER — PANTOPRAZOLE SODIUM 40 MG PO TBEC: 40.0000 mg | DELAYED_RELEASE_TABLET | Freq: Every day | ORAL | 2 refills | Status: DC

## 2018-02-16 NOTE — Telephone Encounter (Signed)
Refill request for Protonix

## 2018-02-21 ENCOUNTER — Other Ambulatory Visit: Payer: Self-pay | Admitting: Adult Health

## 2018-02-21 DIAGNOSIS — F3341 Major depressive disorder, recurrent, in partial remission: Secondary | ICD-10-CM

## 2018-02-23 ENCOUNTER — Other Ambulatory Visit: Payer: Self-pay | Admitting: Internal Medicine

## 2018-02-25 ENCOUNTER — Telehealth: Payer: Self-pay | Admitting: *Deleted

## 2018-02-25 ENCOUNTER — Other Ambulatory Visit: Payer: Self-pay | Admitting: Internal Medicine

## 2018-02-25 DIAGNOSIS — F0280 Dementia in other diseases classified elsewhere without behavioral disturbance: Secondary | ICD-10-CM

## 2018-02-25 DIAGNOSIS — G301 Alzheimer's disease with late onset: Principal | ICD-10-CM

## 2018-02-25 DIAGNOSIS — F01518 Vascular dementia, unspecified severity, with other behavioral disturbance: Secondary | ICD-10-CM

## 2018-02-25 DIAGNOSIS — F0151 Vascular dementia with behavioral disturbance: Secondary | ICD-10-CM

## 2018-02-25 MED ORDER — QUETIAPINE FUMARATE 50 MG PO TABS: ORAL_TABLET | ORAL | 0 refills | Status: DC

## 2018-02-25 NOTE — Telephone Encounter (Signed)
Called the patient's daughter to inform her that Xanax is on backorder at [facility] until 03/2018. A different RX for Seroquel was sent in for the patient. She will explain to the patient.

## 2018-02-26 ENCOUNTER — Other Ambulatory Visit: Payer: Self-pay | Admitting: Internal Medicine

## 2018-03-09 ENCOUNTER — Other Ambulatory Visit: Payer: Self-pay | Admitting: *Deleted

## 2018-03-09 ENCOUNTER — Other Ambulatory Visit: Payer: Self-pay | Admitting: Internal Medicine

## 2018-03-09 DIAGNOSIS — I10 Essential (primary) hypertension: Secondary | ICD-10-CM

## 2018-03-09 MED ORDER — MINOXIDIL 10 MG PO TABS: 5.0000 mg | ORAL_TABLET | Freq: Every day | ORAL | 0 refills | Status: DC

## 2018-03-09 MED ORDER — MINOXIDIL 10 MG PO TABS: ORAL_TABLET | ORAL | 3 refills | Status: DC

## 2018-03-16 ENCOUNTER — Ambulatory Visit: Payer: Self-pay | Admitting: Internal Medicine

## 2018-03-20 ENCOUNTER — Other Ambulatory Visit: Payer: Self-pay | Admitting: Internal Medicine

## 2018-03-20 DIAGNOSIS — F01518 Vascular dementia, unspecified severity, with other behavioral disturbance: Secondary | ICD-10-CM

## 2018-03-20 DIAGNOSIS — F0151 Vascular dementia with behavioral disturbance: Secondary | ICD-10-CM

## 2018-03-31 ENCOUNTER — Other Ambulatory Visit: Payer: Self-pay | Admitting: Internal Medicine

## 2018-03-31 DIAGNOSIS — G4700 Insomnia, unspecified: Secondary | ICD-10-CM

## 2018-03-31 DIAGNOSIS — Z79899 Other long term (current) drug therapy: Secondary | ICD-10-CM

## 2018-03-31 MED ORDER — ALPRAZOLAM 0.5 MG PO TABS: ORAL_TABLET | ORAL | 0 refills | Status: DC

## 2018-04-07 ENCOUNTER — Other Ambulatory Visit: Payer: Self-pay | Admitting: Internal Medicine

## 2018-04-07 DIAGNOSIS — N401 Enlarged prostate with lower urinary tract symptoms: Secondary | ICD-10-CM

## 2018-04-23 ENCOUNTER — Other Ambulatory Visit: Payer: Self-pay | Admitting: Internal Medicine

## 2018-04-23 DIAGNOSIS — G4700 Insomnia, unspecified: Secondary | ICD-10-CM

## 2018-04-23 DIAGNOSIS — Z79899 Other long term (current) drug therapy: Secondary | ICD-10-CM

## 2018-04-29 ENCOUNTER — Telehealth: Payer: Self-pay | Admitting: Physician Assistant

## 2018-04-29 DIAGNOSIS — G4700 Insomnia, unspecified: Secondary | ICD-10-CM

## 2018-04-29 DIAGNOSIS — Z79899 Other long term (current) drug therapy: Secondary | ICD-10-CM

## 2018-04-29 MED ORDER — ALPRAZOLAM 0.5 MG PO TABS: ORAL_TABLET | ORAL | 0 refills | Status: DC

## 2018-04-29 NOTE — Telephone Encounter (Signed)
-----   Message from Elenor Quinones, [place] sent at 04/29/2018 10:18 AM EST ----- Regarding: med refill Contact: [phone] Per pt/[place] note:   Refill on XANAX Please & Thank You!   FYI: pharmacy:  [facility] [address]

## 2018-05-09 ENCOUNTER — Emergency Department (HOSPITAL_COMMUNITY)
Admission: EM | Admit: 2018-05-09 | Discharge: 2018-05-09 | Disposition: A | Discharge status: Home / Self Care | Payer: PPO | Attending: Emergency Medicine | Admitting: Emergency Medicine

## 2018-05-09 ENCOUNTER — Other Ambulatory Visit: Payer: Self-pay

## 2018-05-09 ENCOUNTER — Emergency Department (HOSPITAL_COMMUNITY): Payer: PPO

## 2018-05-09 ENCOUNTER — Encounter: Payer: Self-pay | Admitting: Internal Medicine

## 2018-05-09 DIAGNOSIS — J90 Pleural effusion, not elsewhere classified: Secondary | ICD-10-CM | POA: Insufficient documentation

## 2018-05-09 DIAGNOSIS — I129 Hypertensive chronic kidney disease with stage 1 through stage 4 chronic kidney disease, or unspecified chronic kidney disease: Secondary | ICD-10-CM | POA: Insufficient documentation

## 2018-05-09 DIAGNOSIS — Y999 Unspecified external cause status: Secondary | ICD-10-CM | POA: Insufficient documentation

## 2018-05-09 DIAGNOSIS — Z79899 Other long term (current) drug therapy: Secondary | ICD-10-CM | POA: Insufficient documentation

## 2018-05-09 DIAGNOSIS — W19XXXA Unspecified fall, initial encounter: Secondary | ICD-10-CM | POA: Insufficient documentation

## 2018-05-09 DIAGNOSIS — R0789 Other chest pain: Secondary | ICD-10-CM | POA: Diagnosis not present

## 2018-05-09 DIAGNOSIS — N189 Chronic kidney disease, unspecified: Secondary | ICD-10-CM | POA: Diagnosis not present

## 2018-05-09 DIAGNOSIS — Y929 Unspecified place or not applicable: Secondary | ICD-10-CM | POA: Diagnosis not present

## 2018-05-09 DIAGNOSIS — M79602 Pain in left arm: Secondary | ICD-10-CM | POA: Diagnosis not present

## 2018-05-09 DIAGNOSIS — S299XXA Unspecified injury of thorax, initial encounter: Secondary | ICD-10-CM | POA: Diagnosis not present

## 2018-05-09 DIAGNOSIS — R069 Unspecified abnormalities of breathing: Secondary | ICD-10-CM | POA: Diagnosis not present

## 2018-05-09 DIAGNOSIS — R5381 Other malaise: Secondary | ICD-10-CM | POA: Insufficient documentation

## 2018-05-09 DIAGNOSIS — R7309 Other abnormal glucose: Secondary | ICD-10-CM | POA: Insufficient documentation

## 2018-05-09 DIAGNOSIS — R0781 Pleurodynia: Secondary | ICD-10-CM | POA: Insufficient documentation

## 2018-05-09 DIAGNOSIS — Z7902 Long term (current) use of antithrombotics/antiplatelets: Secondary | ICD-10-CM | POA: Diagnosis not present

## 2018-05-09 DIAGNOSIS — Y939 Activity, unspecified: Secondary | ICD-10-CM | POA: Diagnosis not present

## 2018-05-09 DIAGNOSIS — R0902 Hypoxemia: Secondary | ICD-10-CM | POA: Diagnosis not present

## 2018-05-09 DIAGNOSIS — Z87891 Personal history of nicotine dependence: Secondary | ICD-10-CM | POA: Diagnosis not present

## 2018-05-09 DIAGNOSIS — N183 Chronic kidney disease, stage 3 (moderate): Secondary | ICD-10-CM | POA: Insufficient documentation

## 2018-05-09 DIAGNOSIS — R52 Pain, unspecified: Secondary | ICD-10-CM | POA: Diagnosis not present

## 2018-05-09 LAB — CBC WITH DIFFERENTIAL/PLATELET
Abs Immature Granulocytes: 0.02 10*3/uL (ref 0.00–0.07)
Basophils Absolute: 0 10*3/uL (ref 0.0–0.1)
Basophils Relative: 1 %
Eosinophils Absolute: 0.1 10*3/uL (ref 0.0–0.5)
Eosinophils Relative: 2 %
HCT: 44.2 % (ref 39.0–52.0)
Hemoglobin: 13.6 g/dL (ref 13.0–17.0)
Immature Granulocytes: 0 %
Lymphocytes Relative: 22 %
Lymphs Abs: 1.2 10*3/uL (ref 0.7–4.0)
MCH: 28.3 pg (ref 26.0–34.0)
MCHC: 30.8 g/dL (ref 30.0–36.0)
MCV: 92.1 fL (ref 80.0–100.0)
Monocytes Absolute: 0.6 10*3/uL (ref 0.1–1.0)
Monocytes Relative: 11 %
Neutro Abs: 3.5 10*3/uL (ref 1.7–7.7)
Neutrophils Relative %: 64 %
Platelets: 96 10*3/uL — ABNORMAL LOW (ref 150–400)
RBC: 4.8 MIL/uL (ref 4.22–5.81)
RDW: 13.6 % (ref 11.5–15.5)
WBC: 5.5 10*3/uL (ref 4.0–10.5)
nRBC: 0 % (ref 0.0–0.2)

## 2018-05-09 LAB — URINALYSIS, ROUTINE W REFLEX MICROSCOPIC
BILIRUBIN URINE: NEGATIVE
Glucose, UA: NEGATIVE mg/dL
Ketones, ur: NEGATIVE mg/dL
Leukocytes,Ua: NEGATIVE
Nitrite: NEGATIVE
Protein, ur: NEGATIVE mg/dL
SPECIFIC GRAVITY, URINE: 1.006 (ref 1.005–1.030)
pH: 6 (ref 5.0–8.0)

## 2018-05-09 LAB — COMPREHENSIVE METABOLIC PANEL
ALT: 12 U/L (ref 0–44)
AST: 13 U/L — ABNORMAL LOW (ref 15–41)
Albumin: 3.5 g/dL (ref 3.5–5.0)
Alkaline Phosphatase: 46 U/L (ref 38–126)
Anion gap: 9 (ref 5–15)
BUN: 27 mg/dL — ABNORMAL HIGH (ref 8–23)
CO2: 22 mmol/L (ref 22–32)
Calcium: 8.7 mg/dL — ABNORMAL LOW (ref 8.9–10.3)
Chloride: 108 mmol/L (ref 98–111)
Creatinine, Ser: 2.32 mg/dL — ABNORMAL HIGH (ref 0.61–1.24)
GFR calc Af Amer: 29 mL/min — ABNORMAL LOW (ref >60)
GFR calc non Af Amer: 25 mL/min — ABNORMAL LOW (ref >60)
Glucose, Bld: 94 mg/dL (ref 70–99)
Potassium: 3.6 mmol/L (ref 3.5–5.1)
Sodium: 139 mmol/L (ref 135–145)
Total Bilirubin: 1 mg/dL (ref 0.3–1.2)
Total Protein: 6 g/dL — ABNORMAL LOW (ref 6.5–8.1)

## 2018-05-09 LAB — TROPONIN I
TROPONIN I: 0.03 ng/mL — AB (ref <0.03)
Troponin I: 0.03 ng/mL (ref <0.03)

## 2018-05-09 MED ORDER — SODIUM CHLORIDE 0.9 % IV BOLUS
500.0000 mL | Freq: Once | INTRAVENOUS | Status: AC
  Administered 2018-05-09: 500 mL via INTRAVENOUS

## 2018-05-09 MED ORDER — TRAMADOL HCL 50 MG PO TABS: 50.0000 mg | ORAL_TABLET | Freq: Three times a day (TID) | ORAL | 0 refills | Status: DC | PRN

## 2018-05-09 NOTE — ED Triage Notes (Addendum)
Per EMS, patient from home, reports fall last night. C/o left flank pain. Pain worsens with palpation. Denies head injury and LOC. Patient takes Plavix. Ambulatory. Hx stroke.  Reports left rib pain worsening with movement.

## 2018-05-09 NOTE — ED Notes (Addendum)
Patient's daughter at bedside. Reports patient fell on Monday. States patient c/o left side chest pain radiating to the back x2 days. MD made aware. Patient c/o pain from left axilla down to left flank at this time.

## 2018-05-09 NOTE — ED Notes (Signed)
Patient placed on cardiac monitor.

## 2018-05-09 NOTE — ED Notes (Signed)
Bed: WA22 Expected date:  Expected time:  Means of arrival:  Comments: 

## 2018-05-09 NOTE — ED Notes (Signed)
Patient given coke.

## 2018-05-09 NOTE — Patient Instructions (Signed)

## 2018-05-09 NOTE — ED Provider Notes (Signed)
[facility] DEPT Provider Note   CSN: [id number] Arrival date & time: 05/09/18  1412    History   Chief Complaint Chief Complaint  Patient presents with  . Fall    HPI Lance Ramirez is a 85 y.o. male.     HPI Patient presents after fall yesterday.  States that he lost his balance and fell forward.  No loss of consciousness.  Complaining of pain to the lateral ribs on the left side.  Pain with movement of the left arm and palpation.  No weakness or numbness.  Denies headache or neck pain. Past Medical History:  Diagnosis Date  . Arthritis   . Cerebral embolism with cerebral infarction ([place]) 02/10/2011  . Confusion   . GERD (gastroesophageal reflux disease)   . H/O dizziness   . Hyperlipidemia   . Hypertension   . Other testicular hypofunction   . Stroke ([place]) 05/2012   affected memory  . TIA (transient ischemic attack)   . Vitamin D deficiency     Patient Active Problem List   Diagnosis Date Noted  . Acute kidney injury superimposed on chronic kidney disease ([place])   . Overweight (BMI 25.0-29.9) 12/09/2017  . Aortic atherosclerosis ([place]) 07/18/2017  . CKD (chronic kidney disease) stage 3, GFR 30-59 ml/min (HCC) 02/08/2017  . SDAT (senile dementia of Alzheimer's type) ([place]) 06/14/2015  . Benign prostatic hyperplasia with lower urinary tract symptoms 03/09/2015  . At high risk for falls 12/08/2014  . Depression, major, recurrent, in partial remission ([place]) 05/26/2014  . Prediabetes 05/31/2013  . Medication management 05/31/2013  . GERD (gastroesophageal reflux disease)   . Vitamin D deficiency   . History of CVA (cerebrovascular accident) 05/14/2012  . Hypertension 02/10/2011  . Hyperlipidemia 02/10/2011    Past Surgical History:  Procedure Laterality Date  . AMPUTATION Right 1952   shot himself in toe on accident  . ANTERIOR CERVICAL DECOMP/DISCECTOMY FUSION N/A 10/25/2012   Procedure: ANTERIOR CERVICAL DECOMPRESSION/DISCECTOMY  FUSION CERVICAL THREE-FOUR 1 LEVEL/HARDWARE REMOVAL;  Surgeon: Ophelia Charter, MD;  Location: [facility];  Service: Neurosurgery;  Laterality: N/A;  Cervical three-four anterior cervical decompression with fusion interbody prothesis plating and bonegraft with removal of old Premier plate  . BACK SURGERY    . HERNIA REPAIR          Home Medications    Prior to Admission medications   Medication Sig Start Date End Date Taking? Authorizing Provider  bisoprolol-hydrochlorothiazide [facility]) 10-6.25 MG tablet TAKE 1 TABLET BY MOUTH EVERY DAY 02/23/18  Yes Liane Comber, NP  buPROPion (WELLBUTRIN XL) 300 MG 24 hr tablet Take 1 tablet every morning for Mood 02/21/18  Yes Unk Pinto, MD  Cholecalciferol (VITAMIN D-3) 5000 UNITS TABS Take 1 tablet by mouth 2 (two) times daily.   Yes [provider]  citalopram (CELEXA) 40 MG tablet TAKE 1 TABLET BY MOUTH EVERY DAY 01/08/18  Yes Unk Pinto, MD  clopidogrel (PLAVIX) 75 MG tablet TAKE 1 TABLET (75 MG TOTAL) BY MOUTH DAILY. 04/23/18  Yes Liane Comber, NP  finasteride (PROSCAR) 5 MG tablet TAKE 1 TABLET BY MOUTH EVERY DAY 12/06/17  Yes Unk Pinto, MD  furosemide (LASIX) 20 MG tablet TAKE 1 TABLET BY MOUTH EVERY DAY 02/26/18  Yes Unk Pinto, MD  minoxidil (LONITEN) 10 MG tablet Take 1 tablet daily for BP 03/09/18  Yes Unk Pinto, MD  pantoprazole (PROTONIX) 40 MG tablet Take 1 tablet (40 mg total) by mouth daily. 02/16/18  Yes Liane Comber, NP  QUEtiapine (SEROQUEL) 50 MG tablet TAKE 1 TABLET AT SUPPERTIME & MAY REPEAT AT BEDTIME IF NEEDED FOR SLEEP Patient taking differently: Take 50 mg by mouth every evening.  03/20/18  Yes Unk Pinto, MD  tamsulosin (FLOMAX) 0.4 MG CAPS capsule Take 1 capsule at bedtime for Prostate & Urinary Frequency Patient taking differently: Take 0.4 mg by mouth at bedtime. Take 1 capsule at bedtime for Prostate & Urinary Frequency 08/31/17  Yes Unk Pinto, MD  terazosin  (HYTRIN) 2 MG capsule TAKE 1 CAPSULE (2 MG TOTAL) BY MOUTH AT BEDTIME. 04/07/18  Yes Unk Pinto, MD  ALPRAZolam Duanne Moron) 0.5 MG tablet Take 1/2-1 tab once daily as needed for severe anxiety. Patient taking differently: Take 0.5-1 mg by mouth daily as needed for anxiety. Take 1/2-1 tab once daily as needed for severe anxiety. 04/29/18   Vicie Mutters, PA-C  rosuvastatin (CRESTOR) 20 MG tablet Take 1 tablet (20 mg total) by mouth daily. Patient not taking: Reported on 02/09/2018 01/28/18   Liane Comber, NP  traMADol (ULTRAM) 50 MG tablet Take 1 tablet (50 mg total) by mouth every 8 (eight) hours as needed for severe pain. 05/09/18   Julianne Rice, MD    Family History Family History  Problem Relation Age of Onset  . Sudden death Mother   . Other Father     Social History Social History   Tobacco Use  . Smoking status: Former Smoker    Packs/day: 1.00    Years: 30.00    Pack years: 30.00    Types: Cigarettes    Last attempt to quit: 03/12/1978    Years since quitting: 40.1  . Smokeless tobacco: Former Systems developer    Types: [place] date: 03/12/1978  Substance Use Topics  . Alcohol use: No  . Drug use: No     Allergies   Sulfa antibiotics   Review of Systems Review of Systems  Constitutional: Negative for chills and fever.  HENT: Positive for trouble swallowing. Negative for sore throat.   Respiratory: Negative for cough and shortness of breath.   Cardiovascular: Positive for chest pain.  Gastrointestinal: Negative for abdominal pain, diarrhea and nausea.  Genitourinary: Negative for dysuria, flank pain and frequency.  Musculoskeletal: Negative for back pain and neck pain.  Skin: Negative for rash and wound.  Neurological: Negative for dizziness, weakness, light-headedness, numbness and headaches.  All other systems reviewed and are negative.    Physical Exam Updated Vital Signs BP 138/77   Pulse 70   Temp 97.9 F (36.6 C)   Resp (!) 21   Ht 5\' 11"  (1.803 m)    Wt 74.8 kg   SpO2 96%   BMI 23.01 kg/m   Physical Exam Vitals signs and nursing note reviewed.  Constitutional:      Appearance: Normal appearance. He is well-developed.  HENT:     Head: Normocephalic and atraumatic.     Comments: No obvious scalp trauma.    Nose: Nose normal.     Mouth/Throat:     Mouth: Mucous membranes are dry.     Pharynx: No oropharyngeal exudate or posterior oropharyngeal erythema.  Eyes:     Extraocular Movements: Extraocular movements intact.     Conjunctiva/sclera: Conjunctivae normal.     Pupils: Pupils are equal, round, and reactive to light.  Neck:     Musculoskeletal: Normal range of motion and neck supple.     Comments: No posterior midline cervical tenderness to palpation. Cardiovascular:     Rate and  Rhythm: Normal rate and regular rhythm.     Heart sounds: No murmur. No friction rub. No gallop.   Pulmonary:     Effort: Pulmonary effort is normal. No respiratory distress.     Breath sounds: Normal breath sounds. No stridor. No wheezing, rhonchi or rales.     Comments: Patient has left lateral inferior rib/chest wall tenderness to palpation.  No crepitance or deformity. Chest:     Chest wall: Tenderness present.  Abdominal:     General: Bowel sounds are normal.     Palpations: Abdomen is soft.     Tenderness: There is no abdominal tenderness. There is no guarding or rebound.  Musculoskeletal: Normal range of motion.        General: No swelling, tenderness, deformity or signs of injury.     Right lower leg: No edema.     Left lower leg: No edema.     Comments: No midline thoracic lumbar tenderness.  Pelvis stable.  Full range of motion of bilateral hips.  Distal pulses are 2+.  Skin:    General: Skin is warm and dry.     Capillary Refill: Capillary refill takes less than 2 seconds.     Findings: No erythema or rash.  Neurological:     General: No focal deficit present.     Mental Status: He is alert and oriented to person, place, and  time.     Comments: 5/5 motor in all extremities.  Sensation intact.  Mild confusion present.  Psychiatric:        Behavior: Behavior normal.      ED Treatments / Results  Labs (all labs ordered are listed, but only abnormal results are displayed) Labs Reviewed  CBC WITH DIFFERENTIAL/PLATELET - Abnormal; Notable for the following components:      Result Value   Platelets 96 (*)    All other components within normal limits  COMPREHENSIVE METABOLIC PANEL - Abnormal; Notable for the following components:   BUN 27 (*)    Creatinine, Ser 2.32 (*)    Calcium 8.7 (*)    Total Protein 6.0 (*)    AST 13 (*)    GFR calc non Af Amer 25 (*)    GFR calc Af Amer 29 (*)    All other components within normal limits  TROPONIN I - Abnormal; Notable for the following components:   Troponin I 0.03 (*)    All other components within normal limits  URINALYSIS, ROUTINE W REFLEX MICROSCOPIC - Abnormal; Notable for the following components:   Color, Urine STRAW (*)    Hgb urine dipstick SMALL (*)    Bacteria, UA RARE (*)    Non Squamous Epithelial 0-5 (*)    All other components within normal limits  TROPONIN I - Abnormal; Notable for the following components:   Troponin I 0.03 (*)    All other components within normal limits    EKG EKG Interpretation  Date/Time:  Sunday May 09 2018 16:22:37 EST Ventricular Rate:  64 PR Interval:    QRS Duration: 110 QT Interval:  580 QTC Calculation: 599 R Axis:   18 Text Interpretation:  Sinus rhythm Incomplete left bundle branch block Prolonged QT interval Confirmed by Julianne Rice [phone]) on 05/09/2018 8:46:35 PM   Radiology Dg Ribs Unilateral W/chest Left  Result Date: 05/09/2018 CLINICAL DATA:  Status post fall EXAM: LEFT RIBS AND CHEST - 3+ VIEW COMPARISON:  None. FINDINGS: No fracture or other bone lesions are seen involving the ribs. There  is no evidence of pneumothorax or pleural effusion. Small left pleural effusion. Bibasilar atelectasis.  Heart size and mediastinal contours are within normal limits. IMPRESSION: 1.  No acute osseous injury of the left ribs. 2. Small left pleural effusion. Electronically Signed   By: Kathreen Devoid   On: 05/09/2018 17:02    Procedures Procedures (including critical care time)  Medications Ordered in ED Medications  sodium chloride 0.9 % bolus 500 mL (0 mLs Intravenous Stopped 05/09/18 1910)  sodium chloride 0.9 % bolus 500 mL (500 mLs Intravenous New Bag/Given 05/09/18 1945)     Initial Impression / Assessment and Plan / ED Course  I have reviewed the triage vital signs and the nursing notes.  Pertinent labs & imaging results that were available during my care of the patient were reviewed by me and considered in my medical decision making (see chart for details).       Daughter arrived and states patient's fell roughly 1 week ago.  She is concerned that he has been complaining of episodic central/left-sided chest pain since the fall.  He is also been increasingly drowsy and more confused as of late. Patient rehydrated.  He is awake and alert.  Mild elevation in troponin which is likely due to his chronic renal insufficiency.  3-hour troponin is unchanged.  Will arrange to have home health physical therapy evaluate.  Advised to follow-up closely with his primary physician.  Strict return precautions given. Final Clinical Impressions(s) / ED Diagnoses   Final diagnoses:  Left-sided chest wall pain  Pleural effusion on left  Chronic renal impairment, unspecified CKD stage  Physical deconditioning    ED Discharge Orders         Ordered    traMADol (ULTRAM) 50 MG tablet  Every 8 hours PRN     05/09/18 2044    Home Health     05/09/18 2044    Face-to-face encounter (required for Medicare/Medicaid patients)    Comments:  I Julianne Rice certify that this patient is under my care and that I, or a nurse practitioner or physician's assistant working with me, had a face-to-face encounter that  meets the physician face-to-face encounter requirements with this patient on 05/09/2018. The encounter with the patient was in whole, or in part for the following medical condition(s) which is the primary reason for home health care (List medical condition): Chest wall pain, deconditioning   05/09/18 2044           Julianne Rice, MD 05/09/18 2046

## 2018-05-09 NOTE — Progress Notes (Signed)
This very nice 85 y.o. MWM presents for 6 month follow up with HTN, ASCVD /CVA's /TIA,  HLD, Prediabetes and Vitamin D Deficiency.   Patient has moderate Dementia - felt likely vascular Dementia and is accompanied by a sitter today that is with him 5 days/week.     Patient is treated for HTN (1988) & BP has been controlled at home. Today's BP 136/78.  Patient has hospitalized in May 2019 with a TIA (RLE weakness). ASA dose was increased & Plavix was added.  In 2012 ([facility]) and 2014 patient had CVA.  Patient has had no complaints of any cardiac type chest pain, palpitations, dyspnea / orthopnea / PND, dizziness, claudication, or dependent edema.     Hyperlipidemia is not controlled with diet & meds. Patient denies myalgias or other med SE's. Last Lipids were off meds and not at goal: Lab Results  Component Value Date   CHOL 301 (H) 02/09/2018   HDL 53 02/09/2018   LDLCALC 226 (H) 02/09/2018   TRIG 96 02/09/2018   CHOLHDL 5.7 (H) 02/09/2018      Also, the patient has history of  PreDiabetes (A1c 5.8% / 2011) and then A1c 6.6% in T2_DM range in 2017.  and has had no symptoms of reactive hypoglycemia, diabetic polys, paresthesias or visual blurring.  Last A1c was at goal: Lab Results  Component Value Date   HGBA1C 5.5 02/09/2018      Further, the patient also has history of Vitamin D Deficiency and supplements vitamin D without any suspected side-effects. Last vitamin D was at goal:  Lab Results  Component Value Date   VD25OH 86 02/09/2018   Current Outpatient Medications on File Prior to Visit  Medication Sig  . ALPRAZolam (XANAX) 0.5 MG tablet Take 1/2-1 tab once daily as needed for severe anxiety. (Patient taking differently: Take 0.5-1 mg by mouth daily as needed for anxiety. Take 1/2-1 tab once daily as needed for severe anxiety.)  . bisoprolol-hydrochlorothiazide (ZIAC) 10-6.25 MG tablet TAKE 1 TABLET BY MOUTH EVERY DAY  . buPROPion (WELLBUTRIN XL) 300 MG 24 hr tablet Take 1 tablet  every morning for Mood  . Cholecalciferol (VITAMIN D-3) 5000 UNITS TABS Take 1 tablet by mouth 2 (two) times daily.  . citalopram (CELEXA) 40 MG tablet TAKE 1 TABLET BY MOUTH EVERY DAY  . clopidogrel (PLAVIX) 75 MG tablet TAKE 1 TABLET (75 MG TOTAL) BY MOUTH DAILY.  . finasteride (PROSCAR) 5 MG tablet TAKE 1 TABLET BY MOUTH EVERY DAY  . furosemide (LASIX) 20 MG tablet TAKE 1 TABLET BY MOUTH EVERY DAY  . minoxidil (LONITEN) 10 MG tablet Take 1 tablet daily for BP  . pantoprazole (PROTONIX) 40 MG tablet Take 1 tablet (40 mg total) by mouth daily.  . rosuvastatin (CRESTOR) 20 MG tablet Take 1 tablet (20 mg total) by mouth daily.  . tamsulosin (FLOMAX) 0.4 MG CAPS capsule Take 1 capsule at bedtime for Prostate & Urinary Frequency (Patient taking differently: Take 0.4 mg by mouth at bedtime. Take 1 capsule at bedtime for Prostate & Urinary Frequency)  . terazosin (HYTRIN) 2 MG capsule TAKE 1 CAPSULE (2 MG TOTAL) BY MOUTH AT BEDTIME.  . traMADol (ULTRAM) 50 MG tablet Take 1 tablet (50 mg total) by mouth every 8 (eight) hours as needed for severe pain.  Marland Kitchen QUEtiapine (SEROQUEL) 50 MG tablet TAKE 1 TABLET AT SUPPERTIME & MAY REPEAT AT BEDTIME IF NEEDED FOR SLEEP (Patient not taking: No sig reported)   No current  facility-administered medications on file prior to visit.    Allergies  Allergen Reactions  . Sulfa Antibiotics     Pt unsure of reaction   PMHx:   Past Medical History:  Diagnosis Date  . Arthritis   . Cerebral embolism with cerebral infarction ([place]) 02/10/2011  . Confusion   . GERD (gastroesophageal reflux disease)   . H/O dizziness   . Hyperlipidemia   . Hypertension   . Other testicular hypofunction   . Stroke ([place]) 05/2012   affected memory  . TIA (transient ischemic attack)   . Vitamin D deficiency    Immunization History  Administered Date(s) Administered  . DT 11/28/2008  . Influenza, High Dose Seasonal PF 11/03/2016, 01/25/2018  . Influenza,inj,quad, With Preservative  01/28/2016  . Pneumococcal Conjugate-13 01/28/2016  . Pneumococcal Polysaccharide-23 11/28/2008  . Zoster 09/16/2006   Past Surgical History:  Procedure Laterality Date  . AMPUTATION Right 1952   shot himself in toe on accident  . ANTERIOR CERVICAL DECOMP/DISCECTOMY FUSION N/A 10/25/2012   Procedure: ANTERIOR CERVICAL DECOMPRESSION/DISCECTOMY FUSION CERVICAL THREE-FOUR 1 LEVEL/HARDWARE REMOVAL;  Surgeon: Ophelia Charter, MD;  Location: [place];  Service: Neurosurgery;  Laterality: N/A;  Cervical three-four anterior cervical decompression with fusion interbody prothesis plating and bonegraft with removal of old Premier plate  . BACK SURGERY    . HERNIA REPAIR     FHx:    Reviewed / unchanged  SHx:    Reviewed / unchanged   Systems Review:  Constitutional: Denies fever, chills, wt changes, headaches, insomnia, fatigue, night sweats, change in appetite. Eyes: Denies redness, blurred vision, diplopia, discharge, itchy, watery eyes.  ENT: Denies discharge, congestion, post nasal drip, epistaxis, sore throat, earache, hearing loss, dental pain, tinnitus, vertigo, sinus pain, snoring.  CV: Denies chest pain, palpitations, irregular heartbeat, syncope, dyspnea, diaphoresis, orthopnea, PND, claudication or edema. Respiratory: denies cough, dyspnea, DOE, pleurisy, hoarseness, laryngitis, wheezing.  Gastrointestinal: Denies dysphagia, odynophagia, heartburn, reflux, water brash, abdominal pain or cramps, nausea, vomiting, bloating, diarrhea, constipation, hematemesis, melena, hematochezia  or hemorrhoids. Genitourinary: Denies dysuria, frequency, urgency, nocturia, hesitancy, discharge, hematuria or flank pain. Musculoskeletal: Denies arthralgias, myalgias, stiffness, jt. swelling, pain, limping or strain/sprain.  Skin: Denies pruritus, rash, hives, warts, acne, eczema or change in skin lesion(s). Neuro: No weakness, tremor, incoordination, spasms, paresthesia or pain. Psychiatric: Denies  confusion, memory loss or sensory loss. Endo: Denies change in weight, skin or hair change.  Heme/Lymph: No excessive bleeding, bruising or enlarged lymph nodes.  Physical Exam  BP 136/78   Pulse 64   Temp (!) 97.2 F (36.2 C)   Resp 18   Ht 5\' 10"  (1.778 m)   Wt 197 lb 3.2 oz (89.4 kg)   BMI 28.30 kg/m   Appears  well nourished, well groomed  and in no distress.  Eyes: PERRLA, EOMs, conjunctiva no swelling or erythema. Sinuses: No frontal/maxillary tenderness ENT/Mouth: EAC's clear, TM's nl w/o erythema, bulging. Nares clear w/o erythema, swelling, exudates. Oropharynx clear without erythema or exudates. Oral hygiene is good. Tongue normal, non obstructing. Hearing intact.  Neck: Supple. Thyroid not palpable. Car 2+/2+ without bruits, nodes or JVD. Chest: Respirations nl with BS clear & equal w/o rales, rhonchi, wheezing or stridor.  Cor: Heart sounds normal w/ regular rate and rhythm without sig. murmurs, gallops, clicks or rubs. Peripheral pulses normal and equal  without edema.  Abdomen: Soft & bowel sounds normal. Non-tender w/o guarding, rebound, hernias, masses or organomegaly.  Lymphatics: Unremarkable.  Musculoskeletal: Full ROM all peripheral extremities,  joint stability, 5/5 strength and normal gait.  Skin: Warm, dry without exposed rashes, lesions or ecchymosis apparent.  Neuro: Cranial nerves intact, reflexes equal bilaterally. Sensory-motor testing grossly intact. Tendon reflexes grossly intact.  Pysch: Alert & oriented x 3.  Insight and judgement nl & appropriate. No ideations.  Assessment and Plan:  1. Essential hypertension  - Continue medication, monitor blood pressure at home.  - Continue DASH diet.  Reminder to go to the ER if any CP,  SOB, nausea, dizziness, severe HA, changes vision/speech.  - CBC with Differential/Platelet - COMPLETE METABOLIC PANEL WITH GFR - Magnesium - TSH  2. Hyperlipidemia, mixed  - Continue diet/meds, exercise,& lifestyle  modifications.  - Continue monitor periodic cholesterol/liver & renal functions   - Lipid panel - TSH  3. Abnormal glucose  - Continue diet, exercise  - Lifestyle modifications.  - Monitor appropriate labs.  - Hemoglobin A1c - Insulin, random  4. Vitamin D deficiency  - Continue supplementation.  - VITAMIN D 25 Hydroxy   5. History of multiple cerebrovascular accidents (CVAs)   6. Vascular dementia with behavior disturbance ([place])   7. Medication management  - CBC with Differential/Platelet - COMPLETE METABOLIC PANEL WITH GFR - Magnesium - Lipid panel - TSH - Hemoglobin A1c - Insulin, random - VITAMIN D 25 Hydroxyl        Discussed  regular exercise, BP monitoring, weight control to achieve/maintain BMI less than 25 and discussed med and SE's. Recommended labs to assess and monitor clinical status with further disposition pending results of labs. Over 30 minutes of exam, counseling, chart review was performed.

## 2018-05-10 ENCOUNTER — Ambulatory Visit (INDEPENDENT_AMBULATORY_CARE_PROVIDER_SITE_OTHER): Payer: PPO | Admitting: Internal Medicine

## 2018-05-10 ENCOUNTER — Encounter: Payer: Self-pay | Admitting: Internal Medicine

## 2018-05-10 VITALS — BP 136/78 | HR 64 | Temp 97.2°F | Resp 18 | Ht 70.0 in | Wt 197.2 lb

## 2018-05-10 DIAGNOSIS — Z79899 Other long term (current) drug therapy: Secondary | ICD-10-CM

## 2018-05-10 DIAGNOSIS — I10 Essential (primary) hypertension: Secondary | ICD-10-CM

## 2018-05-10 DIAGNOSIS — F01518 Vascular dementia, unspecified severity, with other behavioral disturbance: Secondary | ICD-10-CM

## 2018-05-10 DIAGNOSIS — E559 Vitamin D deficiency, unspecified: Secondary | ICD-10-CM

## 2018-05-10 DIAGNOSIS — F0151 Vascular dementia with behavioral disturbance: Secondary | ICD-10-CM

## 2018-05-10 DIAGNOSIS — R7309 Other abnormal glucose: Secondary | ICD-10-CM | POA: Diagnosis not present

## 2018-05-10 DIAGNOSIS — Z8673 Personal history of transient ischemic attack (TIA), and cerebral infarction without residual deficits: Secondary | ICD-10-CM

## 2018-05-10 DIAGNOSIS — E782 Mixed hyperlipidemia: Secondary | ICD-10-CM | POA: Diagnosis not present

## 2018-05-10 MED ORDER — CLOPIDOGREL BISULFATE 75 MG PO TABS: ORAL_TABLET | ORAL | 1 refills | Status: DC

## 2018-05-11 LAB — CBC WITH DIFFERENTIAL/PLATELET
ABSOLUTE MONOCYTES: 475 {cells}/uL (ref 200–950)
Basophils Absolute: 38 cells/uL (ref 0–200)
Basophils Relative: 0.7 %
Eosinophils Absolute: 130 cells/uL (ref 15–500)
Eosinophils Relative: 2.4 %
HCT: 45 % (ref 38.5–50.0)
HEMOGLOBIN: 14.6 g/dL (ref 13.2–17.1)
Lymphs Abs: 1129 cells/uL (ref 850–3900)
MCH: 28.1 pg (ref 27.0–33.0)
MCHC: 32.4 g/dL (ref 32.0–36.0)
MCV: 86.7 fL (ref 80.0–100.0)
MPV: 12.5 fL (ref 7.5–12.5)
Monocytes Relative: 8.8 %
Neutro Abs: 3629 cells/uL (ref 1500–7800)
Neutrophils Relative %: 67.2 %
Platelets: 103 10*3/uL — ABNORMAL LOW (ref 140–400)
RBC: 5.19 10*6/uL (ref 4.20–5.80)
RDW: 13.2 % (ref 11.0–15.0)
Total Lymphocyte: 20.9 %
WBC: 5.4 10*3/uL (ref 3.8–10.8)

## 2018-05-11 LAB — COMPLETE METABOLIC PANEL WITH GFR
AG Ratio: 1.6 (calc) (ref 1.0–2.5)
ALT: 9 U/L (ref 9–46)
AST: 12 U/L (ref 10–35)
Albumin: 3.9 g/dL (ref 3.6–5.1)
Alkaline phosphatase (APISO): 48 U/L (ref 35–144)
BUN/Creatinine Ratio: 11 (calc) (ref 6–22)
BUN: 27 mg/dL — ABNORMAL HIGH (ref 7–25)
CALCIUM: 9.3 mg/dL (ref 8.6–10.3)
CO2: 31 mmol/L (ref 20–32)
Chloride: 105 mmol/L (ref 98–110)
Creat: 2.38 mg/dL — ABNORMAL HIGH (ref 0.70–1.11)
GFR, EST AFRICAN AMERICAN: 28 mL/min/{1.73_m2} — AB (ref >=60)
GFR, EST NON AFRICAN AMERICAN: 24 mL/min/{1.73_m2} — AB (ref >=60)
Globulin: 2.4 g/dL (calc) (ref 1.9–3.7)
Glucose, Bld: 113 mg/dL — ABNORMAL HIGH (ref 65–99)
Potassium: 4 mmol/L (ref 3.5–5.3)
Sodium: 142 mmol/L (ref 135–146)
Total Bilirubin: 0.7 mg/dL (ref 0.2–1.2)
Total Protein: 6.3 g/dL (ref 6.1–8.1)

## 2018-05-11 LAB — INSULIN, RANDOM: Insulin: 8.3 u[IU]/mL

## 2018-05-11 LAB — LIPID PANEL
Cholesterol: 154 mg/dL (ref <200)
HDL: 57 mg/dL (ref >=40)
LDL Cholesterol (Calc): 83 mg/dL (calc)
Non-HDL Cholesterol (Calc): 97 mg/dL (calc) (ref <130)
Total CHOL/HDL Ratio: 2.7 (calc) (ref <5.0)
Triglycerides: 63 mg/dL (ref <150)

## 2018-05-11 LAB — TSH: TSH: 4.16 mIU/L (ref 0.40–4.50)

## 2018-05-11 LAB — VITAMIN D 25 HYDROXY (VIT D DEFICIENCY, FRACTURES): Vit D, 25-Hydroxy: 93 ng/mL (ref 30–100)

## 2018-05-11 LAB — HEMOGLOBIN A1C
Hgb A1c MFr Bld: 5.8 % of total Hgb — ABNORMAL HIGH (ref <5.7)
Mean Plasma Glucose: 120 (calc)
eAG (mmol/L): 6.6 (calc)

## 2018-05-11 LAB — MAGNESIUM: Magnesium: 2.2 mg/dL (ref 1.5–2.5)

## 2018-05-18 ENCOUNTER — Other Ambulatory Visit: Payer: Self-pay | Admitting: Adult Health

## 2018-05-20 ENCOUNTER — Other Ambulatory Visit: Payer: Self-pay | Admitting: Adult Health

## 2018-06-08 ENCOUNTER — Other Ambulatory Visit: Payer: Self-pay | Admitting: Internal Medicine

## 2018-06-08 DIAGNOSIS — R3914 Feeling of incomplete bladder emptying: Principal | ICD-10-CM

## 2018-06-08 DIAGNOSIS — N401 Enlarged prostate with lower urinary tract symptoms: Secondary | ICD-10-CM

## 2018-06-24 ENCOUNTER — Other Ambulatory Visit: Payer: Self-pay | Admitting: Physician Assistant

## 2018-06-24 ENCOUNTER — Other Ambulatory Visit: Payer: Self-pay | Admitting: Internal Medicine

## 2018-06-24 DIAGNOSIS — F3341 Major depressive disorder, recurrent, in partial remission: Secondary | ICD-10-CM

## 2018-06-24 DIAGNOSIS — Z79899 Other long term (current) drug therapy: Secondary | ICD-10-CM

## 2018-06-24 DIAGNOSIS — G4700 Insomnia, unspecified: Secondary | ICD-10-CM

## 2018-08-10 ENCOUNTER — Other Ambulatory Visit: Payer: Self-pay | Admitting: Adult Health

## 2018-08-12 ENCOUNTER — Other Ambulatory Visit: Payer: Self-pay | Admitting: Adult Health

## 2018-08-13 NOTE — Progress Notes (Signed)
MEDICARE ANNUAL WELLNESS VISIT AND FOLLOW UP Assessment:    Encounter for Medicare annual wellness exam 1 year  Palpitation -     Holter monitor - 48 hour; Future -     Ambulatory referral to Cardiology -? Need for holter, rule out afib or cause for CVAs  Aortic atherosclerosis (HCC) -     Holter monitor - 48 hour; Future Control blood pressure, cholesterol, glucose, increase exercise.   SDAT (senile dementia of Alzheimer's type) [facility]) -     Ambulatory referral to Neurology - memory is worse, will refer to neurology for CVA/evaluation - patient may need more help at home, may need to have family meeting with son and daughter on patient living by himself.   CKD (chronic kidney disease) stage 3, GFR 30-59 ml/min (HCC) Increase fluids, avoid NSAIDS, monitor sugars, will monitor  Depression, major, recurrent, in partial remission (HCC) Continue medications  Essential hypertension -     CBC with Differential/Platelet -     COMPLETE METABOLIC PANEL WITH GFR -     TSH - continue medications, DASH diet, exercise and monitor at home. Call if greater than 130/80.   Hyperlipidemia, mixed -     Lipid panel check lipids decrease fatty foods increase activity.   Abnormal glucose -     Hemoglobin A1c  Medication management -     Magnesium  Vitamin D deficiency -     VITAMIN D 25 Hydroxy (Vit-D Deficiency, Fractures)  History of multiple cerebrovascular accidents (CVAs) Check holter, continue ASA, plavix for now Control blood pressure, cholesterol, glucose, increase exercise.   At high risk for falls ? Benefit from PT, patient declines, may need to discuss with family  Overweight (BMI 25.0-29.9) Monitor  Benign prostatic hyperplasia with lower urinary tract symptoms, symptom details unspecified Continue medications  Gastroesophageal reflux disease, esophagitis presence not specified Continue PPI/H2 blocker, diet discussed  Urinary symptom or sign -     Urinalysis,  Routine w reflex microscopic -     Urine Culture - rule out infection  Iron deficiency -     Iron,Total/Total Iron Binding Cap  B12 deficiency -     Vitamin B12 - check with memory   Over 30 minutes of exam, counseling, chart review, and critical decision making was performed  Future Appointments  Date Time Provider [place]  08/25/2018 11:30 AM Vicie Mutters, PA-C [facility] None  10/26/2018  1:40 PM Buford Dresser, MD [facility]  11/29/2018  2:00 PM Unk Pinto, MD [facility] None  08/24/2019  2:00 PM Vicie Mutters, PA-C [facility] None    Plan:   During the course of the visit the patient was educated and counseled about appropriate screening and preventive services including:    Pneumococcal vaccine   Influenza vaccine  Prevnar 13  Td vaccine  Screening electrocardiogram  Colorectal cancer screening  Diabetes screening  Glaucoma screening  Nutrition counseling    Subjective:  Lance Ramirez is a 85 y.o. male who presents accompanied by his daughter for Medicare Annual Wellness Visit and 3 month follow up for HTN, hyperlipidemia, prediabetes, and vitamin D Def. has Essential hypertension; Hyperlipidemia, mixed; History of multiple cerebrovascular accidents (CVAs); GERD (gastroesophageal reflux disease); Vitamin D deficiency; Medication management; Depression, major, recurrent, in partial remission ([place]); At high risk for falls; SDAT (senile dementia of Alzheimer's type) ([place]); Benign prostatic hyperplasia with lower urinary tract symptoms; CKD (chronic kidney disease) stage 3, GFR 30-59 ml/min ([place]); Aortic atherosclerosis ([place]); Overweight (BMI 25.0-29.9); and Abnormal glucose on their  problem list.   Patient had TIA/stroke RLE weakness, ASA was increased to 325mg  and plavix was added by a neurohospitalist, he has dementia. He had a headache yesterday, has been taking ibuprofen, his memory is poor/poor historian, had had falls but  denies hitting head/LOC, no changes in vision. He states his heart beats very quickly. I do not see a holter monitor history thought from the MRI it appears there was potentially a central cuase of the CVA's.    He is not driving, states wife lives with him,, it has been hard on him to be isolated. Daughter and son will take him to appointments. Daughter lives in [place], son lives in [place], and son is moving from [place] to here in the next few weeks.  He does not use a cane or walker, he has fallen 3-4 times this year, states he had dizziness. Wife does cooking, wife will do grocery shopping.   His blood pressure has been controlled at home, today their BP is BP: 128/72 He does workout. He denies chest pain, shortness of breath, dizziness.   He is not on cholesterol medication secondary to age. His cholesterol is not at goal. The cholesterol last visit was:  Lab Results  Component Value Date   CHOL 129 08/16/2018   HDL 46 08/16/2018   LDLCALC 66 08/16/2018   TRIG 86 08/16/2018   CHOLHDL 2.8 08/16/2018   He has been working on diet and exercise for prediabetes, and denies nausea, paresthesia of the feet, polydipsia, polyuria, visual disturbances and vomiting. Last A1C in the office was:  Lab Results  Component Value Date   HGBA1C 5.7 (H) 08/16/2018   Last GFR Lab Results  Component Value Date   GFRNONAA 27 (L) 08/16/2018    Patient is on Vitamin D supplement and near goal:    Lab Results  Component Value Date   VD25OH 92 08/16/2018      Medication Review: Current Outpatient Medications on File Prior to Visit  Medication Sig Dispense Refill  . ALPRAZolam (XANAX) 0.5 MG tablet TAKE 1/2-1 TAB ONCE DAILY AS NEEDED FOR SEVERE ANXIETY. 30 tablet 0  . bisoprolol-hydrochlorothiazide (ZIAC) 10-6.25 MG tablet TAKE 1 TABLET BY MOUTH EVERY DAY 90 tablet 0  . buPROPion (WELLBUTRIN XL) 300 MG 24 hr tablet Take 1 tablet every morning for Mood 90 tablet 1  . Cholecalciferol  (VITAMIN D-3) 5000 UNITS TABS Take 1 tablet by mouth 2 (two) times daily.    . citalopram (CELEXA) 40 MG tablet TAKE 1 TABLET BY MOUTH EVERY DAY 90 tablet 0  . clopidogrel (PLAVIX) 75 MG tablet Take 1 tablet daily to prevent stroke 90 tablet 1  . finasteride (PROSCAR) 5 MG tablet TAKE 1 TABLET BY MOUTH EVERY DAY 90 tablet 1  . furosemide (LASIX) 20 MG tablet Take 1 tablet Daily for BP & Fluid 90 tablet 1  . minoxidil (LONITEN) 10 MG tablet Take 1 tablet daily for BP 90 tablet 3  . pantoprazole (PROTONIX) 40 MG tablet TAKE 1 TABLET BY MOUTH EVERY DAY 90 tablet 3  . QUEtiapine (SEROQUEL) 50 MG tablet TAKE 1 TABLET AT SUPPERTIME & MAY REPEAT AT BEDTIME IF NEEDED FOR SLEEP 180 tablet 1  . rosuvastatin (CRESTOR) 20 MG tablet TAKE 1 TABLET BY MOUTH EVERY DAY 90 tablet 0  . tamsulosin (FLOMAX) 0.4 MG CAPS capsule Take 1 capsule at bedtime for Prostate & Urinary Frequency (Patient taking differently: Take 0.4 mg by mouth at bedtime. Take 1 capsule at bedtime  for Prostate & Urinary Frequency) 90 capsule 3  . terazosin (HYTRIN) 2 MG capsule TAKE 1 CAPSULE (2 MG TOTAL) BY MOUTH AT BEDTIME. 90 capsule 0  . traMADol (ULTRAM) 50 MG tablet Take 1 tablet (50 mg total) by mouth every 8 (eight) hours as needed for severe pain. 10 tablet 0   No current facility-administered medications on file prior to visit.     Allergies: Allergies  Allergen Reactions  . Sulfa Antibiotics     Pt unsure of reaction    Current Problems (verified) has Essential hypertension; Hyperlipidemia, mixed; History of multiple cerebrovascular accidents (CVAs); GERD (gastroesophageal reflux disease); Vitamin D deficiency; Medication management; Depression, major, recurrent, in partial remission ([place]); At high risk for falls; SDAT (senile dementia of Alzheimer's type) ([place]); Benign prostatic hyperplasia with lower urinary tract symptoms; CKD (chronic kidney disease) stage 3, GFR 30-59 ml/min ([place]); Aortic atherosclerosis ([place]); Overweight  (BMI 25.0-29.9); and Abnormal glucose on their problem list.  Screening Tests Immunization History  Administered Date(s) Administered  . DT 11/28/2008  . Influenza, High Dose Seasonal PF 11/03/2016, 01/25/2018  . Influenza,inj,quad, With Preservative 01/28/2016  . Pneumococcal Conjugate-13 01/28/2016  . Pneumococcal Polysaccharide-23 11/28/2008  . Zoster 09/16/2006   Preventative care: Last colonoscopy: 2009 declines due to age MRI/MRA brain 07/2017  Prior vaccinations: TD or Tdap: 2010  Influenza: 2019  Pneumococcal: 2010 Prevnar13: 2017 Shingles/Zostavax: 2008  Names of Other Physician/Practitioners you currently use: 1. [facility] here for primary care 2. eye doctor, does not regularly see, last visit 2012 3. 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 Family His family history includes Other in his father; Sudden death in his mother. Social history  He reports that he quit smoking about 40 years ago. His smoking use included cigarettes. He has a 30.00 pack-year smoking history. He quit smokeless tobacco use about 40 years ago.  His smokeless tobacco use included chew. He reports that he does not drink alcohol or use drugs.  MEDICARE WELLNESS OBJECTIVES: Physical activity: Current Exercise Habits: The patient does not participate in regular exercise at present Cardiac risk factors: Cardiac Risk Factors include: advanced age (>67men, >22  women);dyslipidemia;diabetes mellitus;hypertension;male gender;sedentary lifestyle;smoking/ tobacco exposure Depression/mood screen:   Depression screen [facility] 2/9 08/18/2018  Decreased Interest 0  Down, Depressed, Hopeless 0  PHQ - 2 Score 0  Altered sleeping -  Tired, decreased energy -  Change in appetite -  Feeling bad or failure about yourself  -  Trouble concentrating -  Moving slowly or fidgety/restless -  Suicidal thoughts -  PHQ-9 Score -  Difficult doing work/chores -    ADLs:  In your present state of health, do you have any difficulty performing the following activities: 08/18/2018 05/09/2018  Hearing? N N  Vision? N N  Difficulty concentrating or making decisions? Y N  Comment - -  Walking or climbing stairs? N N  Dressing or bathing? N N  Doing errands, shopping? Y N  Preparing Food and eating ? Y -  Using the Toilet? N -  In the past six months, have you accidently leaked urine? Y -  Do you have problems with loss of bowel control? N -  Managing your Medications? Y -  Managing your Finances? Y -  Housekeeping or managing your Housekeeping? Y -  Some recent data might be hidden     Cognitive Testing  Alert? Yes  Normal Appearance?Yes  Oriented to person? Yes  Place? Yes   Time? No- states it is 1920, Jan but correct when asked again to 2020 and June- unable to remember president but can describe him  Recall of three objects?  No  Can perform simple calculations? yes  Displays appropriate judgment?Yes  Can read the correct time from a watch face?Yes- can draw clock  EOL planning: Does Patient Have a Medical Advance Directive?: No Does patient want to make changes to medical advance directive?: Yes (MAU/Ambulatory/Procedural Areas - Information given)   Objective:   Today's Vitals   08/16/18 1352  BP: 128/72  Pulse: 71  Temp: (!) 97.3 F (36.3 C)  SpO2: 99%  Weight: 194 lb 6.4 oz (88.2 kg)  Height: 5\' 10"  (1.778 m)  PainSc: 0-No pain   Body mass index  is 27.89 kg/m.  General appearance: alert, no distress, WD/WN, male HEENT: normocephalic, sclerae anicteric, TMs pearly, nares patent, no discharge or erythema, pharynx normal Oral cavity: MMM, no lesions Neck: supple, no lymphadenopathy, no thyromegaly, no masses Heart: RRR, normal S1, S2, no murmurs Lungs: CTA bilaterally, no wheezes, rhonchi, or rales Abdomen: +bs, soft, non tender, non distended, no masses, no hepatomegaly, no splenomegaly Musculoskeletal: nontender, no swelling, no obvious deformity Extremities: no edema, no cyanosis, no clubbing Pulses: 2+ symmetric, upper and lower extremities, normal cap refill Neurological: alert, oriented x 3, CN2-12 intact, strength normal upper extremities and lower extremities, sensation normal throughout, DTRs 2+ throughout, no cerebellar signs, gait antalgic Psychiatric: normal affect, behavior normal, pleasant, confused   Medicare Attestation I have personally reviewed: The patient's medical and social history Their use of alcohol, tobacco or illicit drugs Their current medications and supplements The patient's functional ability including ADLs,fall risks, home safety risks, cognitive, and hearing and visual impairment Diet and physical activities Evidence for depression or mood disorders  The patient's weight, height, BMI, and visual acuity have been recorded in the chart.  I have made referrals, counseling, and provided education to the patient based on review of the above and I have provided the patient with a written personalized care plan for preventive services.     Vicie Mutters, PA-C   08/18/2018

## 2018-08-15 ENCOUNTER — Other Ambulatory Visit: Payer: Self-pay | Admitting: Internal Medicine

## 2018-08-16 ENCOUNTER — Ambulatory Visit (INDEPENDENT_AMBULATORY_CARE_PROVIDER_SITE_OTHER): Payer: PPO | Admitting: Physician Assistant

## 2018-08-16 ENCOUNTER — Other Ambulatory Visit: Payer: Self-pay

## 2018-08-16 ENCOUNTER — Encounter: Payer: Self-pay | Admitting: Physician Assistant

## 2018-08-16 VITALS — BP 128/72 | HR 71 | Temp 97.3°F | Ht 70.0 in | Wt 194.4 lb

## 2018-08-16 DIAGNOSIS — R399 Unspecified symptoms and signs involving the genitourinary system: Secondary | ICD-10-CM

## 2018-08-16 DIAGNOSIS — Z8673 Personal history of transient ischemic attack (TIA), and cerebral infarction without residual deficits: Secondary | ICD-10-CM

## 2018-08-16 DIAGNOSIS — E782 Mixed hyperlipidemia: Secondary | ICD-10-CM

## 2018-08-16 DIAGNOSIS — F3341 Major depressive disorder, recurrent, in partial remission: Secondary | ICD-10-CM | POA: Diagnosis not present

## 2018-08-16 DIAGNOSIS — R6889 Other general symptoms and signs: Secondary | ICD-10-CM

## 2018-08-16 DIAGNOSIS — E538 Deficiency of other specified B group vitamins: Secondary | ICD-10-CM

## 2018-08-16 DIAGNOSIS — F0280 Dementia in other diseases classified elsewhere without behavioral disturbance: Secondary | ICD-10-CM

## 2018-08-16 DIAGNOSIS — R002 Palpitations: Secondary | ICD-10-CM

## 2018-08-16 DIAGNOSIS — G301 Alzheimer's disease with late onset: Secondary | ICD-10-CM

## 2018-08-16 DIAGNOSIS — N401 Enlarged prostate with lower urinary tract symptoms: Secondary | ICD-10-CM | POA: Diagnosis not present

## 2018-08-16 DIAGNOSIS — Z9181 History of falling: Secondary | ICD-10-CM | POA: Diagnosis not present

## 2018-08-16 DIAGNOSIS — I10 Essential (primary) hypertension: Secondary | ICD-10-CM | POA: Diagnosis not present

## 2018-08-16 DIAGNOSIS — N183 Chronic kidney disease, stage 3 unspecified: Secondary | ICD-10-CM

## 2018-08-16 DIAGNOSIS — E663 Overweight: Secondary | ICD-10-CM | POA: Diagnosis not present

## 2018-08-16 DIAGNOSIS — Z0000 Encounter for general adult medical examination without abnormal findings: Secondary | ICD-10-CM

## 2018-08-16 DIAGNOSIS — E611 Iron deficiency: Secondary | ICD-10-CM

## 2018-08-16 DIAGNOSIS — R7309 Other abnormal glucose: Secondary | ICD-10-CM | POA: Diagnosis not present

## 2018-08-16 DIAGNOSIS — Z0001 Encounter for general adult medical examination with abnormal findings: Secondary | ICD-10-CM

## 2018-08-16 DIAGNOSIS — Z79899 Other long term (current) drug therapy: Secondary | ICD-10-CM | POA: Diagnosis not present

## 2018-08-16 DIAGNOSIS — I700 Atherosclerosis of aorta: Secondary | ICD-10-CM

## 2018-08-16 DIAGNOSIS — E559 Vitamin D deficiency, unspecified: Secondary | ICD-10-CM

## 2018-08-16 DIAGNOSIS — K219 Gastro-esophageal reflux disease without esophagitis: Secondary | ICD-10-CM

## 2018-08-16 NOTE — Patient Instructions (Signed)
Will refer to cardiology/neurology Checking labs to see if this will help Normal neuro exam other than memory today   call 911 if he develop any new symptoms such as worsening headaches, episodes of blurred vision, double vision or complete loss of vision or speech difficulties or motor weakness, shortness of breath or chest pain.    Palpitations Palpitations are feelings that your heartbeat is not normal. Your heartbeat may feel like it is:  Uneven.  Faster than normal.  Fluttering.  Skipping a beat. This is usually not a serious problem. In some cases, you may need tests to rule out any serious problems. Follow these instructions at home: Pay attention to any changes in your condition. Take these actions to help manage your symptoms: Eating and drinking  Avoid: ? Coffee, tea, soft drinks, and energy drinks. ? Chocolate. ? Alcohol. ? Diet pills. Lifestyle   Try to lower your stress. These things can help you relax: ? Yoga. ? Deep breathing and meditation. ? Exercise. ? Using words and images to create positive thoughts (guided imagery). ? Using your mind to control things in your body (biofeedback).  Do not use drugs.  Get plenty of rest and sleep. Keep a regular bed time. General instructions   Take over-the-counter and prescription medicines only as told by your doctor.  Do not use any products that contain nicotine or tobacco, such as cigarettes and e-cigarettes. If you need help quitting, ask your doctor.  Keep all follow-up visits as told by your doctor. This is important. You may need more tests if palpitations do not go away or get worse. Contact a doctor if:  Your symptoms last more than 24 hours.  Your symptoms occur more often. Get help right away if you:  Have chest pain.  Feel short of breath.  Have a very bad headache.  Feel dizzy.  Pass out (faint). Summary  Palpitations are feelings that your heartbeat is uneven or faster than normal.  It may feel like your heart is fluttering or skipping a beat.  Avoid food and drinks that may cause palpitations. These include caffeine, chocolate, and alcohol.  Try to lower your stress. Do not smoke or use drugs.  Get help right away if you faint or have chest pain, shortness of breath, a severe headache, or dizziness. This information is not intended to replace advice given to you by your health care provider. Make sure you discuss any questions you have with your health care provider. Document Released: 12/04/2007 Document Revised: 04/08/2017 Document Reviewed: 04/08/2017 Elsevier Interactive Patient Education  2019 [employer].   Vascular Dementia  Dementia is a condition in which a person has problems with thinking, memory, and behavior that are severe enough to interfere with daily life. Vascular dementia is a type of dementia. It results from brain damage that is caused by the brain not getting enough blood. Vascular dementia usually begins between 29 and 33 years of age. What are the causes? Vascular dementia is caused by conditions that lessen blood flow to the brain. Common causes include:  Multiple small strokes. These may happen without symptoms (silent stroke).  Major stroke.  Damage to small blood vessels in the brain (cerebral small vessel disease). What increases the risk?  Advancing age.  Having had a stroke.  Having high blood pressure (hypertension) or high cholesterol.  Having a disease that affects the heart or blood vessels.  Smoking.  Having diabetes.  Being male.  Being obese.  Not being active.  Having depression. What are the signs or symptoms? Symptoms can vary a lot from one person to another. Symptoms may be mild or severe depending on the amount of damage and which parts of the brain have been affected. Symptoms may begin suddenly or may develop gradually. Symptoms may remain stable, or they may get worse over time. Symptoms of vascular  dementia may be similar to those of Alzheimer disease. The two conditions can occur together (mixed dementia). Symptoms of vascular dementia may include: Mental  Confusion.  Memory problems.  Poor attention and concentration.  Trouble understanding speech.  Depression.  Personality changes.  Trouble recognizing familiar people.  Agitation or aggression.  Paranoia.  Delusions or hallucinations. Physical  Weakness.  Poor balance.  Loss of bladder or bowel control (incontinence).  Unsteady walking (gait).  Speaking problems. Behavioral  Getting lost in familiar places.  Problems with planning and judgment.  Trouble following instructions.  Social problems.  Emotional outbursts.  Trouble with daily activities and self-care.  Problems handling money. How is this diagnosed? There is not a specific test to diagnose vascular dementia. The health care provider will consider the person's medical history and symptoms or changes that are reported by friends and family. The health care provider will do a physical exam and may order lab tests or other tests that check brain and nervous system function. Tests that may be done include:  Blood tests.  Brain imaging tests.  Tests of movement, speech, and other daily activities (neurological exam).  Tests of memory, thinking, and problem-solving (neuropsychological or neurocognitive testing). Diagnosis may involve several specialists. These may include a health care provider who specializes in the brain and nervous system (neurologist), a provider who specializes in disorders of the mind (psychiatrist), and a provider who focuses on speech and language changes (Electrical engineer). How is this treated? There is no cure for vascular dementia. Brain damage that has already occurred cannot be reversed. Treatment depends on:  How severe the condition is.  Which parts of the brain have been affected.  The person's overall  health. Treatment measures aim to:  Treat the underlying cause of vascular dementia and manage risk factors. This may include: ? Controlling blood pressure. ? Lowering cholesterol. ? Treating diabetes. ? Quitting smoking. ? Losing weight.  Manage symptoms.  Prevent further brain damage.  Improve the person's health and quality of life. Treatment for dementia may involve a team of health care providers, including:  A neurologist.  A psychiatrist.  An occupational therapist.  A speech pathologist.  A cardiologist.  An exercise physiologist or physical therapist. Follow these instructions at home: Home care for a person with vascular dementia depends on what caused the condition and how severe the symptoms are. General guidelines for care at home include:  Following the health care provider's instructions for treating the condition that caused the dementia.  Using medicines only as told by the person's health care provider.  Creating a safe living space.  Learning ways to help the person remember people, appointments, and daily activities.  Finding a support group to help caregivers and family to cope with the effects of dementia.  Helping family and friends learn about ways to communicate with a person who has dementia.  Making sure the person keeps all follow-up visits and goes to all rehabilitation appointments as told by the health care team. This is important. Contact a health care provider if:  A fever develops.  New behavioral problems develop.  Problems with swallowing develop.  Confusion gets worse.  Sleepiness gets worse. Get help right away if:  Loss of consciousness occurs.  There is a sudden loss of speech, balance, or thinking ability.  New numbness or paralysis occurs.  Sudden, severe headache occurs.  Vision is lost or suddenly gets worse in one or both eyes. This information is not intended to replace advice given to you by your health  care provider. Make sure you discuss any questions you have with your health care provider. Document Released: 02/14/2002 Document Revised: 08/02/2015 Document Reviewed: 06/07/2014 Elsevier Interactive Patient Education  2019 [employer].

## 2018-08-17 ENCOUNTER — Telehealth: Payer: Self-pay | Admitting: Cardiology

## 2018-08-17 NOTE — Telephone Encounter (Signed)
LVM for patient to call and schedule a new patient appointment with Dr. Harrell Gave.

## 2018-08-18 ENCOUNTER — Encounter: Payer: Self-pay | Admitting: Physician Assistant

## 2018-08-18 ENCOUNTER — Telehealth: Payer: Self-pay | Admitting: Radiology

## 2018-08-18 LAB — URINALYSIS, ROUTINE W REFLEX MICROSCOPIC
Bacteria, UA: NONE SEEN /HPF
Bilirubin Urine: NEGATIVE
Glucose, UA: NEGATIVE
Hgb urine dipstick: NEGATIVE
Hyaline Cast: NONE SEEN /LPF
Ketones, ur: NEGATIVE
Leukocytes,Ua: NEGATIVE
Nitrite: NEGATIVE
Specific Gravity, Urine: 1.02 (ref 1.001–1.03)
Squamous Epithelial / HPF: NONE SEEN /HPF (ref <=5)
WBC, UA: NONE SEEN /HPF (ref 0–5)
pH: <=5 (ref 5.0–8.0)

## 2018-08-18 LAB — CBC WITH DIFFERENTIAL/PLATELET
Absolute Monocytes: 525 cells/uL (ref 200–950)
Basophils Absolute: 61 cells/uL (ref 0–200)
Basophils Relative: 1 %
Eosinophils Absolute: 409 cells/uL (ref 15–500)
Eosinophils Relative: 6.7 %
HCT: 44.7 % (ref 38.5–50.0)
Hemoglobin: 14.5 g/dL (ref 13.2–17.1)
Lymphs Abs: 1391 cells/uL (ref 850–3900)
MCH: 28.5 pg (ref 27.0–33.0)
MCHC: 32.4 g/dL (ref 32.0–36.0)
MCV: 87.8 fL (ref 80.0–100.0)
MPV: 12.2 fL (ref 7.5–12.5)
Monocytes Relative: 8.6 %
Neutro Abs: 3715 cells/uL (ref 1500–7800)
Neutrophils Relative %: 60.9 %
Platelets: 162 10*3/uL (ref 140–400)
RBC: 5.09 10*6/uL (ref 4.20–5.80)
RDW: 14.4 % (ref 11.0–15.0)
Total Lymphocyte: 22.8 %
WBC: 6.1 10*3/uL (ref 3.8–10.8)

## 2018-08-18 LAB — URINE CULTURE
MICRO NUMBER:: 547192
Result:: NO GROWTH
SPECIMEN QUALITY:: ADEQUATE

## 2018-08-18 LAB — COMPLETE METABOLIC PANEL WITH GFR
AG Ratio: 1.8 (calc) (ref 1.0–2.5)
ALT: 10 U/L (ref 9–46)
AST: 10 U/L (ref 10–35)
Albumin: 3.6 g/dL (ref 3.6–5.1)
Alkaline phosphatase (APISO): 46 U/L (ref 35–144)
BUN/Creatinine Ratio: 13 (calc) (ref 6–22)
BUN: 28 mg/dL — ABNORMAL HIGH (ref 7–25)
CO2: 29 mmol/L (ref 20–32)
Calcium: 9.4 mg/dL (ref 8.6–10.3)
Chloride: 106 mmol/L (ref 98–110)
Creat: 2.19 mg/dL — ABNORMAL HIGH (ref 0.70–1.11)
GFR, Est African American: 31 mL/min/{1.73_m2} — ABNORMAL LOW (ref >=60)
GFR, Est Non African American: 27 mL/min/{1.73_m2} — ABNORMAL LOW (ref >=60)
Globulin: 2 g/dL (calc) (ref 1.9–3.7)
Glucose, Bld: 87 mg/dL (ref 65–99)
Potassium: 3.9 mmol/L (ref 3.5–5.3)
Sodium: 142 mmol/L (ref 135–146)
Total Bilirubin: 0.5 mg/dL (ref 0.2–1.2)
Total Protein: 5.6 g/dL — ABNORMAL LOW (ref 6.1–8.1)

## 2018-08-18 LAB — VITAMIN B12: Vitamin B-12: 312 pg/mL (ref 200–1100)

## 2018-08-18 LAB — HEMOGLOBIN A1C
Hgb A1c MFr Bld: 5.7 % of total Hgb — ABNORMAL HIGH (ref <5.7)
Mean Plasma Glucose: 117 (calc)
eAG (mmol/L): 6.5 (calc)

## 2018-08-18 LAB — MAGNESIUM: Magnesium: 2.1 mg/dL (ref 1.5–2.5)

## 2018-08-18 LAB — IRON, TOTAL/TOTAL IRON BINDING CAP
%SAT: 28 % (calc) (ref 20–48)
Iron: 61 ug/dL (ref 50–180)
TIBC: 215 mcg/dL (calc) — ABNORMAL LOW (ref 250–425)

## 2018-08-18 LAB — LIPID PANEL
Cholesterol: 129 mg/dL (ref <200)
HDL: 46 mg/dL (ref >=40)
LDL Cholesterol (Calc): 66 mg/dL (calc)
Non-HDL Cholesterol (Calc): 83 mg/dL (calc) (ref <130)
Total CHOL/HDL Ratio: 2.8 (calc) (ref <5.0)
Triglycerides: 86 mg/dL (ref <150)

## 2018-08-18 LAB — VITAMIN D 25 HYDROXY (VIT D DEFICIENCY, FRACTURES): Vit D, 25-Hydroxy: 92 ng/mL (ref 30–100)

## 2018-08-18 LAB — TSH: TSH: 3.12 mIU/L (ref 0.40–4.50)

## 2018-08-18 NOTE — Telephone Encounter (Signed)
Received below in a staff msg.......Marland KitchenI just spoke with patient's daughter Anderson Malta. Updated mailing & physical address, please mail the Holter Monitor to [place] patient for a 3 day Zio monitor to be mailed. Monitor should arrive in 3-4 days. *48hr holter was switched to a 3 day Zio for mailing purposes.

## 2018-08-18 NOTE — Telephone Encounter (Signed)
Tried calling to verify insurance/address to have monitor mailed to patient. Also want to briefly go over instructions for monitor

## 2018-08-20 ENCOUNTER — Encounter: Payer: Self-pay | Admitting: Neurology

## 2018-08-25 ENCOUNTER — Other Ambulatory Visit: Payer: Self-pay | Admitting: Internal Medicine

## 2018-08-25 ENCOUNTER — Ambulatory Visit (INDEPENDENT_AMBULATORY_CARE_PROVIDER_SITE_OTHER): Payer: PPO | Admitting: Adult Health Nurse Practitioner

## 2018-08-25 ENCOUNTER — Encounter: Payer: Self-pay | Admitting: Adult Health Nurse Practitioner

## 2018-08-25 ENCOUNTER — Ambulatory Visit (INDEPENDENT_AMBULATORY_CARE_PROVIDER_SITE_OTHER): Payer: PPO

## 2018-08-25 ENCOUNTER — Ambulatory Visit: Payer: PPO | Admitting: Physician Assistant

## 2018-08-25 ENCOUNTER — Other Ambulatory Visit: Payer: Self-pay

## 2018-08-25 ENCOUNTER — Telehealth: Payer: Self-pay

## 2018-08-25 VITALS — BP 138/72 | HR 69 | Temp 97.6°F | Ht 70.0 in | Wt 192.6 lb

## 2018-08-25 DIAGNOSIS — R002 Palpitations: Secondary | ICD-10-CM | POA: Diagnosis not present

## 2018-08-25 DIAGNOSIS — E782 Mixed hyperlipidemia: Secondary | ICD-10-CM | POA: Diagnosis not present

## 2018-08-25 DIAGNOSIS — G301 Alzheimer's disease with late onset: Secondary | ICD-10-CM

## 2018-08-25 DIAGNOSIS — I700 Atherosclerosis of aorta: Secondary | ICD-10-CM | POA: Diagnosis not present

## 2018-08-25 DIAGNOSIS — Z79899 Other long term (current) drug therapy: Secondary | ICD-10-CM

## 2018-08-25 DIAGNOSIS — F0280 Dementia in other diseases classified elsewhere without behavioral disturbance: Secondary | ICD-10-CM

## 2018-08-25 DIAGNOSIS — I10 Essential (primary) hypertension: Secondary | ICD-10-CM | POA: Diagnosis not present

## 2018-08-25 DIAGNOSIS — G4700 Insomnia, unspecified: Secondary | ICD-10-CM

## 2018-08-25 DIAGNOSIS — S60051D Contusion of right little finger without damage to nail, subsequent encounter: Secondary | ICD-10-CM

## 2018-08-25 NOTE — Telephone Encounter (Signed)
No phone note necessary

## 2018-08-25 NOTE — Telephone Encounter (Signed)
Chanetta Marshall NP called about the pt. The daughter name Vignesh Willert can someone call her about some sort of registration. The best number for Anderson Malta is [phone]
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
## 2018-08-25 NOTE — Telephone Encounter (Signed)
Chanetta Marshall NP called about the pt. The daughter name Maxime Beckner can someone call her about some sort of registration. The best number for Anderson Malta is [phone]

## 2018-08-26 NOTE — Telephone Encounter (Signed)
Returned call to patients daughter and answered her questions about the monitor

## 2018-08-27 ENCOUNTER — Encounter: Payer: Self-pay | Admitting: Adult Health Nurse Practitioner

## 2018-09-13 DIAGNOSIS — R002 Palpitations: Secondary | ICD-10-CM | POA: Diagnosis not present

## 2018-09-14 ENCOUNTER — Other Ambulatory Visit: Payer: Self-pay | Admitting: Physician Assistant

## 2018-09-14 ENCOUNTER — Other Ambulatory Visit: Payer: Self-pay

## 2018-09-14 DIAGNOSIS — I700 Atherosclerosis of aorta: Secondary | ICD-10-CM

## 2018-09-14 DIAGNOSIS — R002 Palpitations: Secondary | ICD-10-CM

## 2018-09-15 ENCOUNTER — Other Ambulatory Visit: Payer: Self-pay | Admitting: Physician Assistant

## 2018-09-15 DIAGNOSIS — F3341 Major depressive disorder, recurrent, in partial remission: Secondary | ICD-10-CM

## 2018-09-22 ENCOUNTER — Other Ambulatory Visit: Payer: Self-pay | Admitting: Internal Medicine

## 2018-09-22 DIAGNOSIS — F0151 Vascular dementia with behavioral disturbance: Secondary | ICD-10-CM

## 2018-09-22 DIAGNOSIS — F01518 Vascular dementia, unspecified severity, with other behavioral disturbance: Secondary | ICD-10-CM

## 2018-09-29 ENCOUNTER — Other Ambulatory Visit: Payer: Self-pay | Admitting: Adult Health

## 2018-09-29 ENCOUNTER — Encounter: Payer: Self-pay | Admitting: Internal Medicine

## 2018-09-29 ENCOUNTER — Other Ambulatory Visit: Payer: Self-pay | Admitting: Internal Medicine

## 2018-09-29 DIAGNOSIS — G4700 Insomnia, unspecified: Secondary | ICD-10-CM

## 2018-09-29 DIAGNOSIS — Z79899 Other long term (current) drug therapy: Secondary | ICD-10-CM

## 2018-09-29 DIAGNOSIS — Z125 Encounter for screening for malignant neoplasm of prostate: Secondary | ICD-10-CM

## 2018-09-29 DIAGNOSIS — N138 Other obstructive and reflux uropathy: Secondary | ICD-10-CM

## 2018-10-13 ENCOUNTER — Ambulatory Visit: Payer: PPO | Admitting: Neurology

## 2018-10-26 ENCOUNTER — Ambulatory Visit: Payer: PPO | Admitting: Cardiology

## 2018-11-03 ENCOUNTER — Other Ambulatory Visit: Payer: Self-pay | Admitting: Physician Assistant

## 2018-11-26 ENCOUNTER — Other Ambulatory Visit: Payer: Self-pay | Admitting: Internal Medicine

## 2018-11-26 DIAGNOSIS — G4700 Insomnia, unspecified: Secondary | ICD-10-CM

## 2018-11-26 DIAGNOSIS — Z79899 Other long term (current) drug therapy: Secondary | ICD-10-CM

## 2018-11-28 ENCOUNTER — Encounter: Payer: Self-pay | Admitting: Internal Medicine

## 2018-11-28 NOTE — Patient Instructions (Signed)
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
## 2018-11-29 ENCOUNTER — Encounter: Payer: Self-pay | Admitting: Internal Medicine

## 2018-11-29 ENCOUNTER — Other Ambulatory Visit: Payer: Self-pay

## 2018-11-29 ENCOUNTER — Ambulatory Visit (INDEPENDENT_AMBULATORY_CARE_PROVIDER_SITE_OTHER): Payer: PPO | Admitting: Internal Medicine

## 2018-11-29 VITALS — BP 136/74 | HR 60 | Temp 97.2°F | Ht 69.5 in | Wt 192.6 lb

## 2018-11-29 DIAGNOSIS — N183 Chronic kidney disease, stage 3 unspecified: Secondary | ICD-10-CM

## 2018-11-29 DIAGNOSIS — Z136 Encounter for screening for cardiovascular disorders: Secondary | ICD-10-CM | POA: Diagnosis not present

## 2018-11-29 DIAGNOSIS — N138 Other obstructive and reflux uropathy: Secondary | ICD-10-CM

## 2018-11-29 DIAGNOSIS — Z8673 Personal history of transient ischemic attack (TIA), and cerebral infarction without residual deficits: Secondary | ICD-10-CM | POA: Diagnosis not present

## 2018-11-29 DIAGNOSIS — Z0001 Encounter for general adult medical examination with abnormal findings: Secondary | ICD-10-CM

## 2018-11-29 DIAGNOSIS — Z1211 Encounter for screening for malignant neoplasm of colon: Secondary | ICD-10-CM

## 2018-11-29 DIAGNOSIS — E559 Vitamin D deficiency, unspecified: Secondary | ICD-10-CM

## 2018-11-29 DIAGNOSIS — Z0000 Encounter for general adult medical examination without abnormal findings: Secondary | ICD-10-CM

## 2018-11-29 DIAGNOSIS — Z8249 Family history of ischemic heart disease and other diseases of the circulatory system: Secondary | ICD-10-CM | POA: Diagnosis not present

## 2018-11-29 DIAGNOSIS — I10 Essential (primary) hypertension: Secondary | ICD-10-CM | POA: Diagnosis not present

## 2018-11-29 DIAGNOSIS — Z79899 Other long term (current) drug therapy: Secondary | ICD-10-CM | POA: Diagnosis not present

## 2018-11-29 DIAGNOSIS — E782 Mixed hyperlipidemia: Secondary | ICD-10-CM | POA: Diagnosis not present

## 2018-11-29 DIAGNOSIS — Z87891 Personal history of nicotine dependence: Secondary | ICD-10-CM

## 2018-11-29 DIAGNOSIS — N401 Enlarged prostate with lower urinary tract symptoms: Secondary | ICD-10-CM

## 2018-11-29 DIAGNOSIS — T424X1A Poisoning by benzodiazepines, accidental (unintentional), initial encounter: Secondary | ICD-10-CM | POA: Insufficient documentation

## 2018-11-29 DIAGNOSIS — I700 Atherosclerosis of aorta: Secondary | ICD-10-CM

## 2018-11-29 DIAGNOSIS — E538 Deficiency of other specified B group vitamins: Secondary | ICD-10-CM

## 2018-11-29 DIAGNOSIS — R7309 Other abnormal glucose: Secondary | ICD-10-CM

## 2018-11-29 DIAGNOSIS — F01518 Vascular dementia, unspecified severity, with other behavioral disturbance: Secondary | ICD-10-CM

## 2018-11-29 DIAGNOSIS — Z125 Encounter for screening for malignant neoplasm of prostate: Secondary | ICD-10-CM

## 2018-11-29 DIAGNOSIS — N411 Chronic prostatitis: Secondary | ICD-10-CM

## 2018-11-29 DIAGNOSIS — F0151 Vascular dementia with behavioral disturbance: Secondary | ICD-10-CM

## 2018-11-30 ENCOUNTER — Other Ambulatory Visit: Payer: Self-pay | Admitting: Internal Medicine

## 2018-11-30 DIAGNOSIS — N411 Chronic prostatitis: Secondary | ICD-10-CM | POA: Diagnosis not present

## 2018-11-30 LAB — URINALYSIS, ROUTINE W REFLEX MICROSCOPIC
Bilirubin Urine: NEGATIVE
Glucose, UA: NEGATIVE
Hyaline Cast: NONE SEEN /LPF
Ketones, ur: NEGATIVE
Nitrite: NEGATIVE
Specific Gravity, Urine: 1.012 (ref 1.001–1.03)
Squamous Epithelial / HPF: NONE SEEN /HPF (ref <=5)
WBC, UA: >=60 /HPF — AB (ref 0–5)
pH: 6 (ref 5.0–8.0)

## 2018-11-30 LAB — CBC WITH DIFFERENTIAL/PLATELET
Absolute Monocytes: 469 cells/uL (ref 200–950)
Basophils Absolute: 41 cells/uL (ref 0–200)
Basophils Relative: 0.6 %
Eosinophils Absolute: 62 cells/uL (ref 15–500)
Eosinophils Relative: 0.9 %
HCT: 46.1 % (ref 38.5–50.0)
Hemoglobin: 15.3 g/dL (ref 13.2–17.1)
Lymphs Abs: 1387 cells/uL (ref 850–3900)
MCH: 29 pg (ref 27.0–33.0)
MCHC: 33.2 g/dL (ref 32.0–36.0)
MCV: 87.3 fL (ref 80.0–100.0)
MPV: 12.1 fL (ref 7.5–12.5)
Monocytes Relative: 6.8 %
Neutro Abs: 4940 cells/uL (ref 1500–7800)
Neutrophils Relative %: 71.6 %
Platelets: 143 10*3/uL (ref 140–400)
RBC: 5.28 10*6/uL (ref 4.20–5.80)
RDW: 13.8 % (ref 11.0–15.0)
Total Lymphocyte: 20.1 %
WBC: 6.9 10*3/uL (ref 3.8–10.8)

## 2018-11-30 LAB — COMPLETE METABOLIC PANEL WITH GFR
AG Ratio: 1.9 (calc) (ref 1.0–2.5)
ALT: 9 U/L (ref 9–46)
AST: 11 U/L (ref 10–35)
Albumin: 4 g/dL (ref 3.6–5.1)
Alkaline phosphatase (APISO): 47 U/L (ref 35–144)
BUN/Creatinine Ratio: 14 (calc) (ref 6–22)
BUN: 33 mg/dL — ABNORMAL HIGH (ref 7–25)
CO2: 31 mmol/L (ref 20–32)
Calcium: 9.4 mg/dL (ref 8.6–10.3)
Chloride: 104 mmol/L (ref 98–110)
Creat: 2.29 mg/dL — ABNORMAL HIGH (ref 0.70–1.11)
GFR, Est African American: 30 mL/min/{1.73_m2} — ABNORMAL LOW (ref >=60)
GFR, Est Non African American: 26 mL/min/{1.73_m2} — ABNORMAL LOW (ref >=60)
Globulin: 2.1 g/dL (calc) (ref 1.9–3.7)
Glucose, Bld: 101 mg/dL — ABNORMAL HIGH (ref 65–99)
Potassium: 3.9 mmol/L (ref 3.5–5.3)
Sodium: 145 mmol/L (ref 135–146)
Total Bilirubin: 0.6 mg/dL (ref 0.2–1.2)
Total Protein: 6.1 g/dL (ref 6.1–8.1)

## 2018-11-30 LAB — MICROALBUMIN / CREATININE URINE RATIO
Creatinine, Urine: 80 mg/dL (ref 20–320)
Microalb Creat Ratio: 61 mcg/mg creat — ABNORMAL HIGH (ref <30)
Microalb, Ur: 4.9 mg/dL

## 2018-11-30 LAB — LIPID PANEL
Cholesterol: 157 mg/dL (ref <200)
HDL: 57 mg/dL (ref >=40)
LDL Cholesterol (Calc): 80 mg/dL (calc)
Non-HDL Cholesterol (Calc): 100 mg/dL (calc) (ref <130)
Total CHOL/HDL Ratio: 2.8 (calc) (ref <5.0)
Triglycerides: 105 mg/dL (ref <150)

## 2018-11-30 LAB — HEMOGLOBIN A1C
Hgb A1c MFr Bld: 5.7 % of total Hgb — ABNORMAL HIGH (ref <5.7)
Mean Plasma Glucose: 117 (calc)
eAG (mmol/L): 6.5 (calc)

## 2018-11-30 LAB — VITAMIN B12: Vitamin B-12: 364 pg/mL (ref 200–1100)

## 2018-11-30 LAB — VITAMIN D 25 HYDROXY (VIT D DEFICIENCY, FRACTURES): Vit D, 25-Hydroxy: 65 ng/mL (ref 30–100)

## 2018-11-30 LAB — PSA: PSA: 0.6 ng/mL (ref <=4.0)

## 2018-11-30 LAB — INSULIN, RANDOM: Insulin: 10.8 u[IU]/mL

## 2018-11-30 LAB — MAGNESIUM: Magnesium: 2.2 mg/dL (ref 1.5–2.5)

## 2018-11-30 LAB — TSH: TSH: 2.84 mIU/L (ref 0.40–4.50)

## 2018-11-30 NOTE — Addendum Note (Signed)
Addended by: Eulis Canner on: 11/30/2018 01:08 PM   Modules accepted: Orders

## 2018-12-01 ENCOUNTER — Other Ambulatory Visit: Payer: Self-pay | Admitting: Internal Medicine

## 2018-12-01 DIAGNOSIS — N401 Enlarged prostate with lower urinary tract symptoms: Secondary | ICD-10-CM

## 2018-12-02 ENCOUNTER — Other Ambulatory Visit: Payer: Self-pay | Admitting: *Deleted

## 2018-12-02 ENCOUNTER — Other Ambulatory Visit: Payer: Self-pay | Admitting: Internal Medicine

## 2018-12-02 DIAGNOSIS — N411 Chronic prostatitis: Secondary | ICD-10-CM

## 2018-12-02 LAB — URINE CULTURE
MICRO NUMBER:: 909984
SPECIMEN QUALITY:: ADEQUATE

## 2018-12-02 MED ORDER — CEFUROXIME AXETIL 250 MG PO TABS: ORAL_TABLET | ORAL | 0 refills | Status: DC

## 2018-12-29 ENCOUNTER — Other Ambulatory Visit: Payer: Self-pay | Admitting: Internal Medicine

## 2018-12-30 ENCOUNTER — Telehealth: Payer: Self-pay | Admitting: *Deleted

## 2018-12-30 ENCOUNTER — Other Ambulatory Visit: Payer: Self-pay | Admitting: Adult Health

## 2018-12-30 DIAGNOSIS — G4700 Insomnia, unspecified: Secondary | ICD-10-CM

## 2018-12-30 DIAGNOSIS — Z79899 Other long term (current) drug therapy: Secondary | ICD-10-CM

## 2018-12-30 NOTE — Telephone Encounter (Signed)
Per Dr Melford Aase, there will be no refill on Xanax. Daughter is aware.

## 2019-01-04 ENCOUNTER — Telehealth: Payer: Self-pay | Admitting: *Deleted

## 2019-01-04 NOTE — Telephone Encounter (Signed)
Unable to reach the patient's spouse in response to the request for a refill on Xanax.  Per Dr Melford Aase, the RX has ben cancelled, since it will effect his dementia.

## 2019-01-09 ENCOUNTER — Other Ambulatory Visit: Payer: Self-pay | Admitting: Internal Medicine

## 2019-01-09 DIAGNOSIS — Z8673 Personal history of transient ischemic attack (TIA), and cerebral infarction without residual deficits: Secondary | ICD-10-CM

## 2019-02-15 ENCOUNTER — Ambulatory Visit: Payer: Self-pay | Admitting: Adult Health Nurse Practitioner

## 2019-02-23 ENCOUNTER — Other Ambulatory Visit: Payer: Self-pay | Admitting: Internal Medicine

## 2019-02-23 DIAGNOSIS — F3341 Major depressive disorder, recurrent, in partial remission: Secondary | ICD-10-CM

## 2019-02-25 ENCOUNTER — Other Ambulatory Visit: Payer: Self-pay | Admitting: Internal Medicine

## 2019-02-28 ENCOUNTER — Other Ambulatory Visit: Payer: Self-pay | Admitting: Internal Medicine

## 2019-02-28 DIAGNOSIS — I10 Essential (primary) hypertension: Secondary | ICD-10-CM

## 2019-03-01 NOTE — Progress Notes (Deleted)
Virtual Visit via Telephone Note  I connected with Lance Ramirez on 03/01/19 at 11:15 AM EST by telephone and verified that I am speaking with the correct person using two identifiers.  Location: Patient: *** Provider: ***   I discussed the limitations, risks, security and privacy concerns of performing an evaluation and management service by telephone and the availability of in person appointments. I also discussed with the patient that there may be a patient responsible charge related to this service. The patient expressed understanding and agreed to proceed.  I discussed the assessment and treatment plan with the patient. The patient was provided an opportunity to ask questions and all were answered. The patient agreed with the plan and demonstrated an understanding of the instructions.   The patient was advised to call back or seek an in-person evaluation if the symptoms worsen or if the condition fails to improve as anticipated.  I provided *** minutes of non-face-to-face time during this encounter.   Izora Ribas, NP    3 MONTH FOLLOW UP Assessment:    Aortic atherosclerosis ([place]) Per imaging CT 01/2018 Control blood pressure, cholesterol, glucose, increase exercise.   Moderate dementia ([place]) Declined neurology referral earlier this year Has sitter 5 days a week  *** - patient may need more help at home, may need to have family meeting with son and daughter on patient living by himself.   CKD (chronic kidney disease) stage 3, GFR 30-59 ml/min (HCC) Increase fluids, avoid NSAIDS, monitor sugars, will monitor  Depression, major, recurrent, in partial remission (HCC) Continue medications  Essential hypertension -     CBC with Differential/Platelet -     COMPLETE METABOLIC PANEL WITH GFR -     TSH - continue medications, DASH diet, exercise and monitor at home. Call if greater than 130/80.   Hyperlipidemia, mixed -     Lipid panel check lipids decrease fatty  foods increase activity.   Abnormal glucose -     Hemoglobin A1c  Medication management -     Magnesium  Vitamin D deficiency -     VITAMIN D 25 Hydroxy (Vit-D Deficiency, Fractures)  History of multiple cerebrovascular accidents (CVAs) Holter was negative, continue plavix, statin  Control blood pressure, cholesterol, glucose, increase exercise.   At high risk for falls ? Benefit from PT, patient declines, may need to discuss with family **  Overweight (BMI 25.0-29.9) Monitor  Gastroesophageal reflux disease, esophagitis presence not specified Continue PPI/H2 blocker  Over 30 minutes of exam, counseling, chart review, and critical decision making was performed  Future Appointments  Date Time Provider [place]  03/02/2019 11:15 AM Liane Comber, NP [facility] None  06/01/2019  2:30 PM Unk Pinto, MD [facility] None  08/24/2019  2:00 PM Vicie Mutters, PA-C [facility] None  12/27/2019  2:00 PM Unk Pinto, MD [facility] None     Subjective:  Lance Ramirez is a 85 y.o. male who presents for 3 month follow up for HTN, hyperlipidemia, prediabetes, and vitamin D Def. has Essential hypertension; Hyperlipidemia, mixed; History of multiple cerebrovascular accidents (CVAs); GERD (gastroesophageal reflux disease); Vitamin D deficiency; Medication management; Depression, major, recurrent, in partial remission ([place]); At high risk for falls; SDAT (senile dementia of Alzheimer's type) ([place]); Benign prostatic hyperplasia with lower urinary tract symptoms; CKD (chronic kidney disease) stage 3, GFR 30-59 ml/min; Aortic atherosclerosis ([place]); Overweight (BMI 25.0-29.9); Abnormal glucose; and Alprazolam Oversedation on their problem list.   Patient had TIA/stroke RLE weakness, now on plavix and statin.  Patient has  moderate Vascular Dementia and has a sitter 5 days/week.  Xanax was discontinued this year due to concern with oversedation and increased risk of falls.   He  has depression in partial remission, on celexa 20 mg daily, wellbutrin 300 mg daily,  Seroquel ***  He is not driving, states wife lives with him,, it has been hard on him to be isolated. Daughter and son will take him to appointments. Son and daughter live close by and assist.  Wife does cooking, wife will do grocery shopping.  He does not use a cane or walker, he has fallen 3-4 times this year, preceded by dizziness.   In June at [facility] Visit, referrals made made for Cardiology & Neurology consultations which patient declined. Holter monitor was ordered in 08/2018 which showed no atrial fibrillation, high degree block, or pauses noted.     He has aortic atherosclerosis by imaging CT 01/2018.  His blood pressure has been controlled at home, today their BP is   He does workout. He denies chest pain, shortness of breath, dizziness.   He is on cholesterol medication, rosuvastatin 20 mg daily. His cholesterol is at goal. The cholesterol last visit was:  Lab Results  Component Value Date   CHOL 157 11/29/2018   HDL 57 11/29/2018   LDLCALC 80 11/29/2018   TRIG 105 11/29/2018   CHOLHDL 2.8 11/29/2018   He has been working on diet and exercise for prediabetes, and denies nausea, paresthesia of the feet, polydipsia, polyuria, visual disturbances and vomiting. Last A1C in the office was:  Lab Results  Component Value Date   HGBA1C 5.7 (H) 11/29/2018   Last GFR Lab Results  Component Value Date   GFRNONAA 26 (L) 11/29/2018    Patient is on Vitamin D supplement and at goal:    Lab Results  Component Value Date   VD25OH 65 11/29/2018     BMI is There is no height or weight on file to calculate BMI., he {HAS HAS [id number] been working on diet and exercise. Wt Readings from Last 3 Encounters:  11/29/18 192 lb 9.6 oz (87.4 kg)  08/25/18 192 lb 9.6 oz (87.4 kg)  08/16/18 194 lb 6.4 oz (88.2 kg)     Medication Review: Current Outpatient Medications on File Prior to Visit   Medication Sig Dispense Refill  . bisoprolol-hydrochlorothiazide (ZIAC) 10-6.25 MG tablet Take 1 tablet Daily for BP 90 tablet 3  . buPROPion (WELLBUTRIN XL) 300 MG 24 hr tablet Take 1 tablet every morning for Mood 90 tablet 1  . cefUROXime (CEFTIN) 250 MG tablet Take 1 tablet 2 x /day with meals for 3 weeks for Prostate Infection 42 tablet 0  . Cholecalciferol (VITAMIN D-3) 5000 UNITS TABS Take 1 tablet by mouth 2 (two) times daily.    . citalopram (CELEXA) 40 MG tablet Take 1 tablet Daily for Mood 90 tablet 1  . clopidogrel (PLAVIX) 75 MG tablet TAKE 1 TABLET DAILY TO PREVENT STROKE 90 tablet 3  . finasteride (PROSCAR) 5 MG tablet Take 1 tablet Daily for Prostate 90 tablet 3  . furosemide (LASIX) 20 MG tablet TAKE 1 TABLET DAILY FOR BP & FLUID 90 tablet 1  . minoxidil (LONITEN) 10 MG tablet TAKE 1 TABLET DAILY FOR BLOOD PRESSURE 90 tablet 3  . pantoprazole (PROTONIX) 40 MG tablet TAKE 1 TABLET BY MOUTH EVERY DAY 90 tablet 3  . QUEtiapine (SEROQUEL) 50 MG tablet TAKE 1 TABLET AT SUPPERTIME & MAY REPEAT AT BEDTIME IF NEEDED FOR SLEEP  180 tablet 1  . rosuvastatin (CRESTOR) 20 MG tablet Take 1 tablet Daily for Cholesterol 90 tablet 3  . tamsulosin (FLOMAX) 0.4 MG CAPS capsule TAKE 1 CAPSULE AT BEDTIME FOR PROSTATE & URINARY FREQUENCY 90 capsule 3  . terazosin (HYTRIN) 2 MG capsule TAKE 1 CAPSULE (2 MG TOTAL) BY MOUTH AT BEDTIME. 90 capsule 0   No current facility-administered medications on file prior to visit.    Allergies: Allergies  Allergen Reactions  . Alprazolam Other (See Comments)    OverSedation   . Sulfa Antibiotics     Pt unsure of reaction    Current Problems (verified) has Essential hypertension; Hyperlipidemia, mixed; History of multiple cerebrovascular accidents (CVAs); GERD (gastroesophageal reflux disease); Vitamin D deficiency; Medication management; Depression, major, recurrent, in partial remission ([place]); At high risk for falls; SDAT (senile dementia of Alzheimer's  type) ([employer]); Benign prostatic hyperplasia with lower urinary tract symptoms; CKD (chronic kidney disease) stage 3, GFR 30-59 ml/min; Aortic atherosclerosis ([place]); Overweight (BMI 25.0-29.9); Abnormal glucose; and Alprazolam Oversedation on their problem list.  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 Family His family history includes Other in his father; Sudden death in his mother. Social history  He reports that he quit smoking about 40 years ago. His smoking use included cigarettes. He has a 30.00 pack-year smoking history. He quit smokeless tobacco use about 40 years ago.  His smokeless tobacco use included chew. He reports that he does not drink alcohol or use drugs.  Review of Systems  Constitutional: Negative for malaise/fatigue and weight loss.  HENT: Negative for hearing loss and tinnitus.   Eyes: Negative for blurred vision and double vision.  Respiratory: Negative for cough, sputum production, shortness of breath and wheezing.   Cardiovascular: Negative for chest pain, palpitations, orthopnea, claudication, leg swelling and PND.  Gastrointestinal: Negative for abdominal pain, blood in stool, constipation, diarrhea, heartburn, melena, nausea and vomiting.  Genitourinary: Negative.   Musculoskeletal: Positive for falls. Negative for joint pain and myalgias.  Skin: Negative for rash.  Neurological: Negative for dizziness, tingling, sensory change, weakness and headaches.  Endo/Heme/Allergies: Negative for polydipsia.  Psychiatric/Behavioral: Positive for memory loss. Negative for depression, substance abuse and suicidal ideas. The patient is not nervous/anxious and does not have insomnia.   All other systems reviewed and are negative.     Objective:   There were no vitals filed for this visit. There is no height or weight on file to calculate  BMI.  General : Well sounding patient in no apparent distress HEENT: no hoarseness, no cough for duration of visit Lungs: speaks in complete sentences, no audible wheezing, no apparent distress Neurological: alert, oriented x 3 Psychiatric: pleasant, judgement appropriate ***     Izora Ribas, NP   03/01/2019

## 2019-03-02 ENCOUNTER — Other Ambulatory Visit: Payer: Self-pay

## 2019-03-02 ENCOUNTER — Ambulatory Visit: Payer: Self-pay | Admitting: Adult Health

## 2019-03-02 DIAGNOSIS — Z9181 History of falling: Secondary | ICD-10-CM

## 2019-03-02 DIAGNOSIS — Z8673 Personal history of transient ischemic attack (TIA), and cerebral infarction without residual deficits: Secondary | ICD-10-CM

## 2019-03-02 DIAGNOSIS — E663 Overweight: Secondary | ICD-10-CM

## 2019-03-02 DIAGNOSIS — E559 Vitamin D deficiency, unspecified: Secondary | ICD-10-CM

## 2019-03-02 DIAGNOSIS — F0390 Unspecified dementia without behavioral disturbance: Secondary | ICD-10-CM

## 2019-03-02 DIAGNOSIS — R7309 Other abnormal glucose: Secondary | ICD-10-CM

## 2019-03-02 DIAGNOSIS — I700 Atherosclerosis of aorta: Secondary | ICD-10-CM

## 2019-03-02 DIAGNOSIS — F3341 Major depressive disorder, recurrent, in partial remission: Secondary | ICD-10-CM

## 2019-03-02 DIAGNOSIS — I10 Essential (primary) hypertension: Secondary | ICD-10-CM

## 2019-03-02 DIAGNOSIS — Z79899 Other long term (current) drug therapy: Secondary | ICD-10-CM

## 2019-03-03 ENCOUNTER — Encounter: Payer: Self-pay | Admitting: Adult Health

## 2019-03-07 ENCOUNTER — Ambulatory Visit: Payer: PPO | Admitting: Adult Health

## 2019-03-07 NOTE — Progress Notes (Signed)
3 Month Follow Up   Assessment and Plan:   Lance Ramirez was seen today for follow-up and other.  Diagnoses and all orders for this visit:  Essential hypertension Continue current medications: follows with cardiology Monitor blood pressure at home; call if consistently over 130/80 Continue DASH diet.   Reminder to go to the ER if any CP, SOB, nausea, dizziness, severe HA, changes vision/speech, left arm numbness and tingling and jaw pain. -     CBC with Diff -     COMPLETE METABOLIC PANEL WITH GFR -     Magnesium  Aortic atherosclerosis (HCC) Control blood pressure, lipids and glucose Disscused lifestyle modifications, diet & exercise Continue to monitor  Moderate dementia without behavioral disturbance ([place]) Discussed safety as a priority in the home. Daughter checks frequently by phone and at least 3 times a week as well as private caregiver in home daily, to start soon. Taking Seroquel, one tablet at night.  Discussed taking half tablet and earlier.  Cause of fall?  Fall in home, initial encounter Continue to monitor behavior No external visible injuries Discussed imaging if no improvement, deferred today  Myalgias of muscles in neck Likely related to impact from fall with forward projection into cabinet Discussed heat 15-53mn, QID Discussed tylenol as needed for pain Also discussed OTC voltaren gel  Vitamin D deficiency -     Vitamin D (25 hydroxy)  Overweight (BMI 25.0-29.9) Discussed dietary and exercise modifications  Stage 3a chronic kidney disease Lasix has been D/C Monitor lower extremities for edema Increase water intake, add flavoring ie lemon  Depression, major, recurrent, in partial remission (HCC) Continue medications: Celexa 443mand Bupropion 30085maily in am. Discussed stress management techniques  Discussed, increase water,intake & good sleep hygiene  Discussed increasing exercise & vegetables in diet   Hyperlipidemia, mixed Continue  medications: 78m35mily Discussed dietary and exercise modifications Low fat diet -     Lipid Profile  Gastroesophageal reflux disease, unspecified whether esophagitis present Doing well at this time Continue: protonix 40mg61mly Diet discussed Monitor for triggers Avoid food with high acid content Avoid excessive cafeine Increase water intake  Abnormal glucose -     Hemoglobin A1c  -     Insulin, random  BPH Doing well at this time Continue medications: tamsulosin 0.4mg W27m continue to monitor  Medication management continued     Continue diet and meds as discussed. Further disposition pending results of labs. Discussed med's effects and SE's.  Patient agrees with plan of care and opportunity to ask questions/voice concerns. Over 30 minutes of interview, chart review, face to face interview with patient / family, exam, counseling, and critical decision making was performed.   Future Appointments  [place]  2:30 PM McKeowUnk Pinto[facility] None  09/08/2019  3:00 PM CollieVicie Mutters [facility] None  12/27/2019  2:00 PM McKeowUnk PintoA[facility] None    ----------------------------------------------------------------------------------------------------------------------  HPI 85 y.o67male  presents for 3 month follow up on HTN, HLD, abnormal glucose with history of pre-diabetes, dementia, BPH, GERD, depression, weight and vitamin D deficiency.   He is accompanied with his daughter, JennifAnderson Maltaffice today.  She provides transportation for both of her parents.  Reports her mother had recent hospitalization related to a fall and home health for fall precautions.  JennifAnderson Maltats they had some in home care but obligations were not being met with their care.  She reports they have found in home care for  both of them and will be starting soon which she thinks will help.  She reports that her father fell, timing of this  is unclear.  It was not reported by patient after happening.  She reports that they found his bathroom with a large circular indentation through the wood door of the sink. Patient reports he was getting up to use the bathroom.  Reports he fell and his head wen into the bathroom cabinet door.  Reports that the area was sore.  He used the bathroom and went back to bed.  He is a poor historian.  Today he reports soreness the the base of his neck, placing his hand on the right side.  He has done any therapies or taken anything for this.  Lance Ramirez denies any behavioral /mood changes of her father since then.  He does take seroquel at night one tablet but does not take until 11pm.  Daughter reports that he will sleep a large portion of the day if there is not anyone there. He eats two full meals a day and snacks during the day.  Reports that he drinks coffee and soda.  He reports he does not like water.  Daughter reports that they provide reminders for him but are not there 24hours a day.   BMI is Body mass index is 28.67 kg/m., he has not been working on diet and exercise. Wt Readings from Last 3 Encounters:  03/08/19 197 lb (89.4 kg)  11/29/18 192 lb 9.6 oz (87.4 kg)  08/25/18 192 lb 9.6 oz (87.4 kg)     HTN predates 1988 and controlled taking minoxidil and ziac 10/6.25mg .  07/2017 TIA w/ RUE weakness which resolved ans ASA dose increased to 353m & plavix since.  History of CVA in 2012 and 2014 with TPA. His blood pressure has been controlled at home, today their BP is BP: 126/80  He does not workout. He denies any cardiac symptoms, chest pains, palpitations, shortness of breath, dizziness or lower extremity edema.     He is on cholesterol medication Rosuvastatin and denies myalgias.   His cholesterol is at goal. The cholesterol last visit was:   Lab Results  Component Value Date   CHOL 157 11/29/2018   HDL 57 11/29/2018   LDLCALC 80 11/29/2018   TRIG 105 11/29/2018   CHOLHDL 2.8 11/29/2018     He has not been working on diet and exercise for prediabetes, and denies polydipsia, polyuria, visual disturbances, vomiting and weight loss. Last A1C in the office was:  Lab Results  Component Value Date   HGBA1C 5.7 (H) 11/29/2018   Patient is on Vitamin D supplement.   Lab Results  Component Value Date   VD25OH 65 11/29/2018       Current Medications:  Current Outpatient Medications on File Prior to Visit  Medication Sig  . bisoprolol-hydrochlorothiazide (ZIAC) 10-6.25 MG tablet Take 1 tablet Daily for BP  . buPROPion (WELLBUTRIN XL) 300 MG 24 hr tablet Take 1 tablet every morning for Mood  . cefUROXime (CEFTIN) 250 MG tablet Take 1 tablet 2 x /day with meals for 3 weeks for Prostate Infection  . Cholecalciferol (VITAMIN D-3) 5000 UNITS TABS Take 1 tablet by mouth 2 (two) times daily.  . citalopram (CELEXA) 40 MG tablet Take 1 tablet Daily for Mood  . clopidogrel (PLAVIX) 75 MG tablet TAKE 1 TABLET DAILY TO PREVENT STROKE  . finasteride (PROSCAR) 5 MG tablet Take 1 tablet Daily for Prostate  . minoxidil (LONITEN) 10  MG tablet TAKE 1 TABLET DAILY FOR BLOOD PRESSURE  . pantoprazole (PROTONIX) 40 MG tablet TAKE 1 TABLET BY MOUTH EVERY DAY  . QUEtiapine (SEROQUEL) 50 MG tablet TAKE 1 TABLET AT SUPPERTIME & MAY REPEAT AT BEDTIME IF NEEDED FOR SLEEP  . rosuvastatin (CRESTOR) 20 MG tablet Take 1 tablet Daily for Cholesterol  . tamsulosin (FLOMAX) 0.4 MG CAPS capsule TAKE 1 CAPSULE AT BEDTIME FOR PROSTATE & URINARY FREQUENCY  . terazosin (HYTRIN) 2 MG capsule TAKE 1 CAPSULE (2 MG TOTAL) BY MOUTH AT BEDTIME.   No current facility-administered medications on file prior to visit.    Allergies:  Allergies  Allergen Reactions  . Alprazolam Other (See Comments)    OverSedation   . Sulfa Antibiotics     Pt unsure of reaction      Medical History:  Past Medical History:  Diagnosis Date  . Arthritis   . Cerebral embolism with cerebral infarction ([place]) 02/10/2011  . Confusion    . GERD (gastroesophageal reflux disease)   . H/O dizziness   . Hyperlipidemia   . Hypertension   . Other testicular hypofunction   . Stroke ([place]) 05/2012   affected memory  . TIA (transient ischemic attack)   . Vitamin D deficiency      Family history- Reviewed and unchanged Family History  Problem Relation Age of Onset  . Sudden death Mother   . Other Father      Social history- Reviewed and unchanged Social History   Tobacco Use  . Smoking status: Former Smoker    Packs/day: 1.00    Years: 30.00    Pack years: 30.00    Types: Cigarettes    Quit date: 03/12/1978    Years since quitting: 41.0  . Smokeless tobacco: Former Systems developer    Types: [place] date: 03/12/1978  Substance Use Topics  . Alcohol use: No  . Drug use: No     Names of Other Physician/Practitioners you currently use: 1.  [facility] here for primary care    Patient Care Team: Unk Pinto, MD as PCP - General (Internal Medicine) Ronald Lobo, MD as Consulting Physician (Gastroenterology) [name], DO as Consulting Physician (Neurology) Newman Pies, MD as Consulting Physician (Neurosurgery) Vickey Huger, MD as Consulting Physician (Orthopedic Surgery) Buford Dresser, MD as Consulting Physician (Cardiology)    Screening Tests: Immunization History  Administered Date(s) Administered  . DT (Pediatric) 11/28/2008  . Influenza, High Dose Seasonal PF 11/03/2016, 01/25/2018  . Influenza,inj,quad, With Preservative 01/28/2016  . Pneumococcal Conjugate-13 01/28/2016  . Pneumococcal Polysaccharide-23 11/28/2008  . Zoster 09/16/2006       Review of Systems:  ROS - Patient unable to answer related to dementia.  All noted above with assistance of daughter.    Physical Exam: BP 126/80   Pulse 71   Temp 97.6 F (36.4 C)   Wt 197 lb (89.4 kg)   SpO2 95%   BMI 28.67 kg/m  Wt Readings from Last 3 Encounters:  03/08/19 197 lb  (89.4 kg)  11/29/18 192 lb 9.6 oz (87.4 kg)  08/25/18 192 lb 9.6 oz (87.4 kg)   General Appearance: pale, in no apparent distress. Eyes: PERRLA, EOMs, conjunctiva no swelling or erythema Sinuses: No Frontal/maxillary tenderness ENT/Mouth: Ext aud canals clear, TMs without erythema, bulging.  Hearing normal. Wearing a mask/ Neck: Supple, thyroid normal.  Respiratory: Respiratory effort normal, BS equal bilaterally without rales, rhonchi, wheezing or stridor.  Cardio: RRR with no MRGs. Brisk  peripheral pulses without edema.  Abdomen: Soft, + BS.  Non tender, no guarding, rebound, hernias, masses. Lymphatics: Non tender without lymphadenopathy.  Musculoskeletal: Full ROM, 5/5 strength, Shuffling gait.  Tenderness to palpation to right trapezius, splenius capitis muscles. Skin: Warm, dry without rashes, lesions, ecchymosis. Neuro: Cranial nerves intact. No cerebellar symptoms.  Psych: Awake and oriented X 2, flat affect, insight and judgment Impaired.   Garnet Sierras, NP [facility] 10:27 AM

## 2019-03-08 ENCOUNTER — Ambulatory Visit (INDEPENDENT_AMBULATORY_CARE_PROVIDER_SITE_OTHER): Payer: PPO | Admitting: Adult Health Nurse Practitioner

## 2019-03-08 ENCOUNTER — Encounter: Payer: Self-pay | Admitting: Adult Health Nurse Practitioner

## 2019-03-08 ENCOUNTER — Other Ambulatory Visit: Payer: Self-pay

## 2019-03-08 VITALS — BP 126/80 | HR 71 | Temp 97.6°F | Wt 197.0 lb

## 2019-03-08 DIAGNOSIS — E663 Overweight: Secondary | ICD-10-CM

## 2019-03-08 DIAGNOSIS — F01518 Vascular dementia, unspecified severity, with other behavioral disturbance: Secondary | ICD-10-CM

## 2019-03-08 DIAGNOSIS — I700 Atherosclerosis of aorta: Secondary | ICD-10-CM | POA: Diagnosis not present

## 2019-03-08 DIAGNOSIS — F0390 Unspecified dementia without behavioral disturbance: Secondary | ICD-10-CM

## 2019-03-08 DIAGNOSIS — E559 Vitamin D deficiency, unspecified: Secondary | ICD-10-CM | POA: Diagnosis not present

## 2019-03-08 DIAGNOSIS — N1831 Chronic kidney disease, stage 3a: Secondary | ICD-10-CM

## 2019-03-08 DIAGNOSIS — K219 Gastro-esophageal reflux disease without esophagitis: Secondary | ICD-10-CM

## 2019-03-08 DIAGNOSIS — N401 Enlarged prostate with lower urinary tract symptoms: Secondary | ICD-10-CM

## 2019-03-08 DIAGNOSIS — F3341 Major depressive disorder, recurrent, in partial remission: Secondary | ICD-10-CM

## 2019-03-08 DIAGNOSIS — I10 Essential (primary) hypertension: Secondary | ICD-10-CM | POA: Diagnosis not present

## 2019-03-08 DIAGNOSIS — R7309 Other abnormal glucose: Secondary | ICD-10-CM

## 2019-03-08 DIAGNOSIS — Y92009 Unspecified place in unspecified non-institutional (private) residence as the place of occurrence of the external cause: Secondary | ICD-10-CM

## 2019-03-08 DIAGNOSIS — M7918 Myalgia, other site: Secondary | ICD-10-CM

## 2019-03-08 DIAGNOSIS — Z79899 Other long term (current) drug therapy: Secondary | ICD-10-CM | POA: Diagnosis not present

## 2019-03-08 DIAGNOSIS — F0151 Vascular dementia with behavioral disturbance: Secondary | ICD-10-CM

## 2019-03-08 DIAGNOSIS — F03B0 Unspecified dementia, moderate, without behavioral disturbance, psychotic disturbance, mood disturbance, and anxiety: Secondary | ICD-10-CM

## 2019-03-08 DIAGNOSIS — E782 Mixed hyperlipidemia: Secondary | ICD-10-CM

## 2019-03-08 DIAGNOSIS — N138 Other obstructive and reflux uropathy: Secondary | ICD-10-CM

## 2019-03-08 DIAGNOSIS — W19XXXA Unspecified fall, initial encounter: Secondary | ICD-10-CM

## 2019-03-08 NOTE — Patient Instructions (Addendum)
   Try taking half a tablet of the Seroquel ([place]) at night.  Monitor for any behavioral changes regarding the fall.  Change in his vision, severe headaches it is important to see immediate attention for imaging.  We will contact you with lab results.   DRINK MORE WATER!  Flavor with lemon or even tea is better than drinking Coke.  Monitor lower legs for any swelling.  Walking and drinking water help to reduce this.

## 2019-03-09 LAB — CBC WITH DIFFERENTIAL/PLATELET
Absolute Monocytes: 496 cells/uL (ref 200–950)
Basophils Absolute: 41 cells/uL (ref 0–200)
Basophils Relative: 0.7 %
Eosinophils Absolute: 118 cells/uL (ref 15–500)
Eosinophils Relative: 2 %
HCT: 42.5 % (ref 38.5–50.0)
Hemoglobin: 14.3 g/dL (ref 13.2–17.1)
Lymphs Abs: 1204 cells/uL (ref 850–3900)
MCH: 29.5 pg (ref 27.0–33.0)
MCHC: 33.6 g/dL (ref 32.0–36.0)
MCV: 87.8 fL (ref 80.0–100.0)
MPV: 12 fL (ref 7.5–12.5)
Monocytes Relative: 8.4 %
Neutro Abs: 4042 cells/uL (ref 1500–7800)
Neutrophils Relative %: 68.5 %
Platelets: 165 10*3/uL (ref 140–400)
RBC: 4.84 10*6/uL (ref 4.20–5.80)
RDW: 13.6 % (ref 11.0–15.0)
Total Lymphocyte: 20.4 %
WBC: 5.9 10*3/uL (ref 3.8–10.8)

## 2019-03-09 LAB — VITAMIN D 25 HYDROXY (VIT D DEFICIENCY, FRACTURES): Vit D, 25-Hydroxy: 45 ng/mL (ref 30–100)

## 2019-03-09 LAB — COMPLETE METABOLIC PANEL WITH GFR
AG Ratio: 1.7 (calc) (ref 1.0–2.5)
ALT: 9 U/L (ref 9–46)
AST: 10 U/L (ref 10–35)
Albumin: 3.6 g/dL (ref 3.6–5.1)
Alkaline phosphatase (APISO): 48 U/L (ref 35–144)
BUN/Creatinine Ratio: 14 (calc) (ref 6–22)
BUN: 36 mg/dL — ABNORMAL HIGH (ref 7–25)
CO2: 32 mmol/L (ref 20–32)
Calcium: 9.4 mg/dL (ref 8.6–10.3)
Chloride: 103 mmol/L (ref 98–110)
Creat: 2.65 mg/dL — ABNORMAL HIGH (ref 0.70–1.11)
GFR, Est African American: 25 mL/min/{1.73_m2} — ABNORMAL LOW (ref >=60)
GFR, Est Non African American: 21 mL/min/{1.73_m2} — ABNORMAL LOW (ref >=60)
Globulin: 2.1 g/dL (calc) (ref 1.9–3.7)
Glucose, Bld: 144 mg/dL — ABNORMAL HIGH (ref 65–99)
Potassium: 3.3 mmol/L — ABNORMAL LOW (ref 3.5–5.3)
Sodium: 145 mmol/L (ref 135–146)
Total Bilirubin: 0.4 mg/dL (ref 0.2–1.2)
Total Protein: 5.7 g/dL — ABNORMAL LOW (ref 6.1–8.1)

## 2019-03-09 LAB — MAGNESIUM: Magnesium: 2.5 mg/dL (ref 1.5–2.5)

## 2019-03-09 LAB — LIPID PANEL
Cholesterol: 141 mg/dL (ref <200)
HDL: 58 mg/dL (ref >=40)
LDL Cholesterol (Calc): 67 mg/dL (calc)
Non-HDL Cholesterol (Calc): 83 mg/dL (calc) (ref <130)
Total CHOL/HDL Ratio: 2.4 (calc) (ref <5.0)
Triglycerides: 82 mg/dL (ref <150)

## 2019-03-09 LAB — HEMOGLOBIN A1C
Hgb A1c MFr Bld: 5.9 % of total Hgb — ABNORMAL HIGH (ref <5.7)
Mean Plasma Glucose: 123 (calc)
eAG (mmol/L): 6.8 (calc)

## 2019-03-09 LAB — INSULIN, RANDOM: Insulin: 15.4 u[IU]/mL

## 2019-03-10 ENCOUNTER — Other Ambulatory Visit: Payer: Self-pay | Admitting: Adult Health Nurse Practitioner

## 2019-03-18 ENCOUNTER — Other Ambulatory Visit: Payer: Self-pay | Admitting: Internal Medicine

## 2019-03-18 DIAGNOSIS — F0151 Vascular dementia with behavioral disturbance: Secondary | ICD-10-CM

## 2019-03-18 DIAGNOSIS — F01518 Vascular dementia, unspecified severity, with other behavioral disturbance: Secondary | ICD-10-CM

## 2019-03-21 ENCOUNTER — Other Ambulatory Visit: Payer: Self-pay | Admitting: Internal Medicine

## 2019-03-21 MED ORDER — TRAMADOL-ACETAMINOPHEN 37.5-325 MG PO TABS: ORAL_TABLET | ORAL | 0 refills | Status: AC

## 2019-03-21 MED ORDER — SILVER SULFADIAZINE 1 % EX CREA: TOPICAL_CREAM | CUTANEOUS | 3 refills | Status: AC

## 2019-03-21 NOTE — Progress Notes (Signed)
  Rx silvadene cream sent in for burn on Rt thigh from hot coffee spilled given by his sitter

## 2019-04-19 ENCOUNTER — Ambulatory Visit: Payer: PPO | Admitting: Family Medicine

## 2019-04-20 ENCOUNTER — Other Ambulatory Visit: Payer: Self-pay | Admitting: Internal Medicine

## 2019-04-20 ENCOUNTER — Telehealth: Payer: Self-pay | Admitting: *Deleted

## 2019-04-20 DIAGNOSIS — R0989 Other specified symptoms and signs involving the circulatory and respiratory systems: Secondary | ICD-10-CM

## 2019-04-20 MED ORDER — OLMESARTAN MEDOXOMIL 40 MG PO TABS: ORAL_TABLET | ORAL | 3 refills | Status: AC

## 2019-04-20 NOTE — Progress Notes (Signed)
  BP's elevated at 201/70 by son, Edd Arbour and sent in new Rx Benicar 40 mg

## 2019-04-20 NOTE — Telephone Encounter (Signed)
Son called and reported the patient's BP is elevated at 201/70. Per Dr Melford Aase, add a new RX for Benicar,which he sent to the patient's pharmacy.  Per Dr Melford Aase, the son was advised to give the patient 1 whole tablet now and do not repeat until bedtime on 04/21/2019. Give the medication at bedtime and if systolic BP is less the [id number], reduce the dose to 1/2 tablet at bedtime.  Son is aware.

## 2019-04-21 ENCOUNTER — Other Ambulatory Visit: Payer: Self-pay | Admitting: Internal Medicine

## 2019-04-21 ENCOUNTER — Inpatient Hospital Stay (HOSPITAL_COMMUNITY)
Admission: EM | Admit: 2019-04-21 | Discharge: 2019-04-26 | DRG: 071 | Disposition: A | Discharge status: Home Health | Payer: PPO | Attending: Family Medicine | Admitting: Family Medicine

## 2019-04-21 ENCOUNTER — Emergency Department (HOSPITAL_COMMUNITY): Payer: PPO

## 2019-04-21 DIAGNOSIS — G450 Vertebro-basilar artery syndrome: Secondary | ICD-10-CM | POA: Diagnosis present

## 2019-04-21 DIAGNOSIS — F0281 Dementia in other diseases classified elsewhere with behavioral disturbance: Secondary | ICD-10-CM | POA: Diagnosis not present

## 2019-04-21 DIAGNOSIS — N400 Enlarged prostate without lower urinary tract symptoms: Secondary | ICD-10-CM | POA: Diagnosis present

## 2019-04-21 DIAGNOSIS — Z882 Allergy status to sulfonamides status: Secondary | ICD-10-CM | POA: Diagnosis not present

## 2019-04-21 DIAGNOSIS — I130 Hypertensive heart and chronic kidney disease with heart failure and stage 1 through stage 4 chronic kidney disease, or unspecified chronic kidney disease: Secondary | ICD-10-CM | POA: Diagnosis present

## 2019-04-21 DIAGNOSIS — Z8673 Personal history of transient ischemic attack (TIA), and cerebral infarction without residual deficits: Secondary | ICD-10-CM

## 2019-04-21 DIAGNOSIS — R1310 Dysphagia, unspecified: Secondary | ICD-10-CM | POA: Diagnosis present

## 2019-04-21 DIAGNOSIS — N184 Chronic kidney disease, stage 4 (severe): Secondary | ICD-10-CM | POA: Diagnosis present

## 2019-04-21 DIAGNOSIS — Z888 Allergy status to other drugs, medicaments and biological substances status: Secondary | ICD-10-CM

## 2019-04-21 DIAGNOSIS — F0391 Unspecified dementia with behavioral disturbance: Secondary | ICD-10-CM | POA: Diagnosis present

## 2019-04-21 DIAGNOSIS — I342 Nonrheumatic mitral (valve) stenosis: Secondary | ICD-10-CM | POA: Diagnosis not present

## 2019-04-21 DIAGNOSIS — Z20822 Contact with and (suspected) exposure to covid-19: Secondary | ICD-10-CM | POA: Diagnosis present

## 2019-04-21 DIAGNOSIS — Z7902 Long term (current) use of antithrombotics/antiplatelets: Secondary | ICD-10-CM

## 2019-04-21 DIAGNOSIS — R531 Weakness: Secondary | ICD-10-CM | POA: Diagnosis not present

## 2019-04-21 DIAGNOSIS — R4189 Other symptoms and signs involving cognitive functions and awareness: Secondary | ICD-10-CM | POA: Diagnosis present

## 2019-04-21 DIAGNOSIS — I959 Hypotension, unspecified: Secondary | ICD-10-CM | POA: Diagnosis not present

## 2019-04-21 DIAGNOSIS — Z89421 Acquired absence of other right toe(s): Secondary | ICD-10-CM | POA: Diagnosis not present

## 2019-04-21 DIAGNOSIS — K219 Gastro-esophageal reflux disease without esophagitis: Secondary | ICD-10-CM | POA: Diagnosis present

## 2019-04-21 DIAGNOSIS — R29810 Facial weakness: Secondary | ICD-10-CM | POA: Diagnosis not present

## 2019-04-21 DIAGNOSIS — E785 Hyperlipidemia, unspecified: Secondary | ICD-10-CM | POA: Diagnosis present

## 2019-04-21 DIAGNOSIS — R2689 Other abnormalities of gait and mobility: Secondary | ICD-10-CM | POA: Diagnosis not present

## 2019-04-21 DIAGNOSIS — Z66 Do not resuscitate: Secondary | ICD-10-CM | POA: Diagnosis present

## 2019-04-21 DIAGNOSIS — D696 Thrombocytopenia, unspecified: Secondary | ICD-10-CM | POA: Diagnosis not present

## 2019-04-21 DIAGNOSIS — G9341 Metabolic encephalopathy: Secondary | ICD-10-CM | POA: Diagnosis present

## 2019-04-21 DIAGNOSIS — F05 Delirium due to known physiological condition: Secondary | ICD-10-CM | POA: Diagnosis present

## 2019-04-21 DIAGNOSIS — Z79891 Long term (current) use of opiate analgesic: Secondary | ICD-10-CM | POA: Diagnosis not present

## 2019-04-21 DIAGNOSIS — G9340 Encephalopathy, unspecified: Secondary | ICD-10-CM

## 2019-04-21 DIAGNOSIS — Z87891 Personal history of nicotine dependence: Secondary | ICD-10-CM | POA: Diagnosis not present

## 2019-04-21 DIAGNOSIS — R000 Tachycardia, unspecified: Secondary | ICD-10-CM | POA: Diagnosis present

## 2019-04-21 DIAGNOSIS — F4321 Adjustment disorder with depressed mood: Secondary | ICD-10-CM | POA: Diagnosis present

## 2019-04-21 DIAGNOSIS — I10 Essential (primary) hypertension: Secondary | ICD-10-CM | POA: Diagnosis not present

## 2019-04-21 DIAGNOSIS — E559 Vitamin D deficiency, unspecified: Secondary | ICD-10-CM | POA: Diagnosis present

## 2019-04-21 DIAGNOSIS — R4781 Slurred speech: Secondary | ICD-10-CM | POA: Diagnosis present

## 2019-04-21 DIAGNOSIS — I6523 Occlusion and stenosis of bilateral carotid arteries: Secondary | ICD-10-CM | POA: Diagnosis not present

## 2019-04-21 DIAGNOSIS — M1990 Unspecified osteoarthritis, unspecified site: Secondary | ICD-10-CM | POA: Diagnosis present

## 2019-04-21 DIAGNOSIS — R339 Retention of urine, unspecified: Secondary | ICD-10-CM | POA: Diagnosis present

## 2019-04-21 DIAGNOSIS — F39 Unspecified mood [affective] disorder: Secondary | ICD-10-CM | POA: Diagnosis not present

## 2019-04-21 DIAGNOSIS — Z981 Arthrodesis status: Secondary | ICD-10-CM | POA: Diagnosis not present

## 2019-04-21 DIAGNOSIS — R29714 NIHSS score 14: Secondary | ICD-10-CM | POA: Diagnosis present

## 2019-04-21 DIAGNOSIS — F03918 Unspecified dementia, unspecified severity, with other behavioral disturbance: Secondary | ICD-10-CM

## 2019-04-21 DIAGNOSIS — Z79899 Other long term (current) drug therapy: Secondary | ICD-10-CM | POA: Diagnosis not present

## 2019-04-21 DIAGNOSIS — R262 Difficulty in walking, not elsewhere classified: Secondary | ICD-10-CM | POA: Diagnosis present

## 2019-04-21 DIAGNOSIS — I5032 Chronic diastolic (congestive) heart failure: Secondary | ICD-10-CM | POA: Diagnosis present

## 2019-04-21 DIAGNOSIS — D631 Anemia in chronic kidney disease: Secondary | ICD-10-CM | POA: Diagnosis not present

## 2019-04-21 DIAGNOSIS — I6502 Occlusion and stenosis of left vertebral artery: Secondary | ICD-10-CM | POA: Diagnosis not present

## 2019-04-21 DIAGNOSIS — F0390 Unspecified dementia without behavioral disturbance: Secondary | ICD-10-CM | POA: Diagnosis not present

## 2019-04-21 DIAGNOSIS — R0682 Tachypnea, not elsewhere classified: Secondary | ICD-10-CM | POA: Diagnosis not present

## 2019-04-21 DIAGNOSIS — R0602 Shortness of breath: Secondary | ICD-10-CM | POA: Diagnosis not present

## 2019-04-21 DIAGNOSIS — R0902 Hypoxemia: Secondary | ICD-10-CM | POA: Diagnosis not present

## 2019-04-21 DIAGNOSIS — I6501 Occlusion and stenosis of right vertebral artery: Secondary | ICD-10-CM | POA: Diagnosis not present

## 2019-04-21 LAB — CBC
HCT: 41.7 % (ref 39.0–52.0)
Hemoglobin: 13.5 g/dL (ref 13.0–17.0)
MCH: 29.2 pg (ref 26.0–34.0)
MCHC: 32.4 g/dL (ref 30.0–36.0)
MCV: 90.1 fL (ref 80.0–100.0)
Platelets: 130 10*3/uL — ABNORMAL LOW (ref 150–400)
RBC: 4.63 MIL/uL (ref 4.22–5.81)
RDW: 13.7 % (ref 11.5–15.5)
WBC: 4.7 10*3/uL (ref 4.0–10.5)
nRBC: 0 % (ref 0.0–0.2)

## 2019-04-21 LAB — DIFFERENTIAL
Abs Immature Granulocytes: 0.01 10*3/uL (ref 0.00–0.07)
Basophils Absolute: 0 10*3/uL (ref 0.0–0.1)
Basophils Relative: 1 %
Eosinophils Absolute: 0.1 10*3/uL (ref 0.0–0.5)
Eosinophils Relative: 2 %
Immature Granulocytes: 0 %
Lymphocytes Relative: 34 %
Lymphs Abs: 1.6 10*3/uL (ref 0.7–4.0)
Monocytes Absolute: 0.4 10*3/uL (ref 0.1–1.0)
Monocytes Relative: 9 %
Neutro Abs: 2.6 10*3/uL (ref 1.7–7.7)
Neutrophils Relative %: 54 %

## 2019-04-21 LAB — COMPREHENSIVE METABOLIC PANEL
ALT: 11 U/L (ref 0–44)
AST: 10 U/L — ABNORMAL LOW (ref 15–41)
Albumin: 2.9 g/dL — ABNORMAL LOW (ref 3.5–5.0)
Alkaline Phosphatase: 39 U/L (ref 38–126)
Anion gap: 9 (ref 5–15)
BUN: 21 mg/dL (ref 8–23)
CO2: 21 mmol/L — ABNORMAL LOW (ref 22–32)
Calcium: 8.5 mg/dL — ABNORMAL LOW (ref 8.9–10.3)
Chloride: 108 mmol/L (ref 98–111)
Creatinine, Ser: 1.78 mg/dL — ABNORMAL HIGH (ref 0.61–1.24)
GFR calc Af Amer: 40 mL/min — ABNORMAL LOW (ref >60)
GFR calc non Af Amer: 35 mL/min — ABNORMAL LOW (ref >60)
Glucose, Bld: 122 mg/dL — ABNORMAL HIGH (ref 70–99)
Potassium: 4.2 mmol/L (ref 3.5–5.1)
Sodium: 138 mmol/L (ref 135–145)
Total Bilirubin: 0.6 mg/dL (ref 0.3–1.2)
Total Protein: 5 g/dL — ABNORMAL LOW (ref 6.5–8.1)

## 2019-04-21 LAB — POCT I-STAT 7, (LYTES, BLD GAS, ICA,H+H)
Acid-base deficit: 2 mmol/L (ref 0.0–2.0)
Bicarbonate: 23.7 mmol/L (ref 20.0–28.0)
Calcium, Ion: 1.24 mmol/L (ref 1.15–1.40)
HCT: 37 % — ABNORMAL LOW (ref 39.0–52.0)
Hemoglobin: 12.6 g/dL — ABNORMAL LOW (ref 13.0–17.0)
O2 Saturation: 95 %
Patient temperature: 96.7
Potassium: 4.1 mmol/L (ref 3.5–5.1)
Sodium: 137 mmol/L (ref 135–145)
TCO2: 25 mmol/L (ref 22–32)
pCO2 arterial: 43.5 mmHg (ref 32.0–48.0)
pH, Arterial: 7.34 — ABNORMAL LOW (ref 7.350–7.450)
pO2, Arterial: 78 mmHg — ABNORMAL LOW (ref 83.0–108.0)

## 2019-04-21 LAB — I-STAT CHEM 8, ED
BUN: 25 mg/dL — ABNORMAL HIGH (ref 8–23)
Calcium, Ion: 1.16 mmol/L (ref 1.15–1.40)
Chloride: 105 mmol/L (ref 98–111)
Creatinine, Ser: 1.8 mg/dL — ABNORMAL HIGH (ref 0.61–1.24)
Glucose, Bld: 109 mg/dL — ABNORMAL HIGH (ref 70–99)
HCT: 40 % (ref 39.0–52.0)
Hemoglobin: 13.6 g/dL (ref 13.0–17.0)
Potassium: 4.3 mmol/L (ref 3.5–5.1)
Sodium: 138 mmol/L (ref 135–145)
TCO2: 26 mmol/L (ref 22–32)

## 2019-04-21 LAB — PROTIME-INR
INR: 1.1 (ref 0.8–1.2)
Prothrombin Time: 14.6 seconds (ref 11.4–15.2)

## 2019-04-21 LAB — TROPONIN I (HIGH SENSITIVITY): Troponin I (High Sensitivity): 29 ng/L — ABNORMAL HIGH (ref <18)

## 2019-04-21 LAB — LACTIC ACID, PLASMA: Lactic Acid, Venous: 0.7 mmol/L (ref 0.5–1.9)

## 2019-04-21 LAB — CBG MONITORING, ED: Glucose-Capillary: 105 mg/dL — ABNORMAL HIGH (ref 70–99)

## 2019-04-21 LAB — APTT: aPTT: 32 seconds (ref 24–36)

## 2019-04-21 MED ORDER — DOXAZOSIN MESYLATE 8 MG PO TABS: ORAL_TABLET | ORAL | 3 refills | Status: AC

## 2019-04-21 MED ORDER — SODIUM CHLORIDE 0.9 % IV BOLUS
1000.0000 mL | Freq: Once | INTRAVENOUS | Status: AC
  Administered 2019-04-21: 22:00:00 1000 mL via INTRAVENOUS

## 2019-04-21 MED ORDER — SODIUM CHLORIDE 0.9% FLUSH
3.0000 mL | Freq: Once | INTRAVENOUS | Status: DC
Start: 2019-04-21 — End: 2019-04-27

## 2019-04-21 MED ORDER — IOHEXOL 350 MG/ML SOLN
100.0000 mL | Freq: Once | INTRAVENOUS | Status: AC | PRN
  Administered 2019-04-21: 100 mL via INTRAVENOUS

## 2019-04-21 NOTE — ED Notes (Signed)
Daughter at bedside updated in regards to [place]

## 2019-04-21 NOTE — ED Provider Notes (Addendum)
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 unresponsive EXAM: PORTABLE CHEST 1 VIEW COMPARISON:  05/09/2018 FINDINGS: Single frontal view of the chest demonstrates a stable cardiac silhouette. Chronic small left pleural effusion and left basilar consolidation. No pneumothorax. Right chest is clear. IMPRESSION: 1. Chronic small left pleural effusion and left basilar atelectasis. 2. Otherwise no acute process. Electronically Signed   By: Randa Ngo M.D.   On: 04/21/2019 22:03   CT HEAD CODE STROKE WO CONTRAST  Result Date: 04/21/2019 CLINICAL DATA:  Code stroke. Slurred speech and unsteady gait. Last seen normal 1930 hours EXAM: CT HEAD WITHOUT CONTRAST TECHNIQUE: Contiguous axial images were obtained from the base of the skull through the vertex without intravenous contrast. COMPARISON:  07/16/2017 head CT FINDINGS: Brain: There is no mass, hemorrhage or extra-axial collection. There is generalized atrophy without lobar predilection. There is hypoattenuation of the periventricular white matter, most commonly indicating chronic ischemic microangiopathy. There is an old left thalamus small vessel infarct. Vascular: No  abnormal hyperdensity of the major intracranial arteries or dural venous sinuses. No intracranial atherosclerosis. Skull: The visualized skull base, calvarium and extracranial soft tissues are normal. Sinuses/Orbits: No fluid levels or advanced mucosal thickening of the visualized paranasal sinuses. No mastoid or middle ear effusion. The orbits are normal. ASPECTS [facility] Stroke Program Early CT Score) - Ganglionic level infarction (caudate, lentiform nuclei, internal capsule, insula, M1-M3 cortex): 7 - Supraganglionic infarction (M4-M6 cortex): 3 Total score (0-10 with 10 being normal): 10 IMPRESSION: 1. No acute finding. 2. Chronic ischemic microangiopathy and generalized volume loss. 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 3. ASPECTS is 10. These results were communicated to Dr. Amie Portland at 9:24 pm on 04/21/2019 by text page via the [facility], [facility], [facility] messaging system. Electronically Signed   By: Ulyses Jarred M.D.   On: 04/21/2019 21:25    Procedures Procedures (including critical care time)  CRITICAL CARE Performed by: Lucrezia Starch   Total critical care time: 45 minutes  Critical care time  was exclusive of separately billable procedures and treating other patients.  Critical care was necessary to treat or prevent imminent or life-threatening deterioration.  Critical care was time spent personally by me on the following activities: development of treatment plan with patient and/or surrogate as well as nursing, discussions with consultants, evaluation of patient's response to treatment, examination of patient, obtaining history from patient or surrogate, ordering and performing treatments and interventions, ordering and review of laboratory studies, ordering and review of radiographic studies, pulse oximetry and re-evaluation of patient's condition.   Medications Ordered in ED Medications  sodium chloride flush (NS) 0.9 % injection 3 mL (3 mLs Intravenous Not Given 04/21/19 2124)  iohexol  (OMNIPAQUE) 350 MG/ML injection 100 mL (100 mLs Intravenous Contrast Given 04/21/19 2118)  sodium chloride 0.9 % bolus 1,000 mL (0 mLs Intravenous Paused 04/21/19 2202)    ED Course  I have reviewed the triage vital signs and the nursing notes.  Pertinent labs & imaging results that were available during my care of the patient were reviewed by me and considered in my medical decision making (see chart for details).  Clinical Course as of Apr 21 2323  Thu Apr 21, 2019  2235 Discussed with Dr. Lorraine Lax again, MRI negative, will plan for hospitalist admission   [RD]  2251 Rechecked patient, no change   [RD]  2305 Discussed again with neurology, will admit to hospitalist service   [RD]    Clinical Course User Index [RD] Lucrezia Starch, MD   MDM Rules/Calculators/A&P                      86 year old male presents to ER as code stroke.  Patient appears to be somnolent, briskly arousing to pain no, no focal findings however.  Glucose normal, vital signs stable though likely relative hypotension.  Neurology evaluated, CTs, MRI all negative for acute stroke.  Work-up including extensive labs grossly within normal limits.  EKG without ischemic changes, troponin borderline elevated.  Etiology for patient's altered mental status not entirely clear at this time.  Long discussion regarding goals of care with family, DNR/DNI, potentially okay with some conservative measures, however would not want any aggressive measures.  Will consult hospitalist for admission.   Final Clinical Impression(s) / ED Diagnoses Final diagnoses:  Encephalopathy    Rx / DC Orders ED Discharge Orders    None       Lucrezia Starch, MD 04/21/19 2325    Lucrezia Starch, MD 04/21/19 2325

## 2019-04-21 NOTE — ED Triage Notes (Signed)
[place]  EMS reports family noticed shuffled gait and garbled speech. Upon EMS arrival pt was able to follow commands with no speech.   On EMS arrival to ED, decreased responsiveness. Responds to pain.   Family reports recent change in BP medications.

## 2019-04-21 NOTE — ED Notes (Signed)
Code stroke cancelled at this time.   Pt has not returned to baseline. RN to continue q1h Neuro. Per. Dr. Rory Percy

## 2019-04-21 NOTE — ED Notes (Signed)
Pt O2 stats decreased to 86% RA while snoring. Elevated HOB, stimulated pt, and placed on 2L O2. SPO2 increased to 94%

## 2019-04-21 NOTE — Consult Note (Addendum)
Neurology Consultation  Reason for Consult: Code stroke for gait difficulty and difficulty talking Referring Physician: Dr. Roslynn Amble  CC: Difficulty walking and talking  History is obtained from: Chart, patient's family-daughter and son  HPI: Lance Ramirez is a 86 y.o. male past medical history of dementia, prior strokes, hypertension, hyperlipidemia, who lives independently and requires help with ADLs all throughout the day with a caregiver but is able to walk independently, was in usual state of health till somewhere around 7 PM today when the family noticed that he had sudden onset of difficulty walking and was slurring his words.  He suddenly became less responsive and to the point where he could not be woken up with voice.  EMS was called.  They noted that he was responding to questions and in route became progressively less responsive. No prior history of illness or sickness preceding this event.  No chest pain nausea vomiting. They report that he had a recent change in his medications with a antihypertensive added this morning because of systolic blood pressures were running in the 200s.  His blood pressure was noted by EMS and in the ER were in the [id number] tens systolic. Review of chart reveals that he had a stroke work-up completed May 2019 when he presented with transient right-sided leg weakness and an MRI done to complete work-up revealed punctate scattered emboli concerning for cardioembolic etiology but no A. fib was diagnosed either in the hospital or by outpatient 30-day monitoring.  Vessel imaging at that time revealed the patient had extensive atherosclerosis of the posterior circulation-imaging studies discussed in detail below. His blood pressures recently have been difficult to control and family has very good history keeping of the blood pressures-till about January 26 of this year, his blood pressures were 0000000 to [id number] systolic when this started to rise at that time and most  recently over the last week have been in the 190s to 200s.  Because of that reason he was started on a new hypertension medications.  Since then he has been complaining of excessive urination. Family did not repeat any focal weakness or evidence of aphasia but reported that he just felt very weak all over and felt he could not stand as well as was slurring his words. Upon initial assessment in the emergency room, he was lethargic, did not open eyes to voice but was nonfocal on rest of the exam based on response to noxious stimulation.  He did not follow any commands.   LKW: 7 PM on 04/21/2019 tpa given?: no, nonfocal exam, MRI negative for stroke Premorbid modified Rankin scale (mRS): 2  ROS:ROS was performed and is negative except as noted in the HPI.  Patient unable to provide any history-review of systems performed with the family  Past Medical History:  Diagnosis Date  . Arthritis   . 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 Confusion   . GERD (gastroesophageal reflux disease)   . H/O dizziness   . Hyperlipidemia   . Hypertension   . Other testicular hypofunction   . Stroke ([place]) 05/2012   affected memory  . TIA (transient ischemic attack)   . Vitamin D deficiency     Family History  Problem Relation Age of Onset  . Sudden death Mother   . Other Father    Social History:   reports that he quit smoking about 41 years ago. His smoking use included cigarettes. He has a 30.00 pack-year smoking history. He quit smokeless tobacco use about  41 years ago.  His smokeless tobacco use included chew. He reports that he does not drink alcohol or use drugs. Retired [employer].  Lives at home with a aide helping from morning to night including cooking cleaning medication administration etc. According to family, he has not done his finances for many years. Recent loss of partner-lost his wife a month ago and since has been excessively crying and  sad.  Medications  Current Facility-Administered Medications:  .  sodium chloride flush (NS) 0.9 % injection 3 mL, 3 mL, Intravenous, Once, Dykstra, Ellwood Dense, MD  Current Outpatient Medications:  .  bisoprolol-hydrochlorothiazide (ZIAC) 10-6.25 MG tablet, Take 1 tablet Daily for BP, Disp: 90 tablet, Rfl: 3 .  buPROPion (WELLBUTRIN XL) 300 MG 24 hr tablet, Take 1 tablet every morning for Mood, Disp: 90 tablet, Rfl: 1 .  Cholecalciferol (VITAMIN D-3) 5000 UNITS TABS, Take 1 tablet by mouth 2 (two) times daily., Disp: , Rfl:  .  citalopram (CELEXA) 40 MG tablet, Take 1 tablet Daily for Mood, Disp: 90 tablet, Rfl: 1 .  clopidogrel (PLAVIX) 75 MG tablet, TAKE 1 TABLET DAILY TO PREVENT STROKE, Disp: 90 tablet, Rfl: 3 .  doxazosin (CARDURA) 8 MG tablet, Take 1 tablet at Bedtime for BP, Disp: 90 tablet, Rfl: 3 .  finasteride (PROSCAR) 5 MG tablet, Take 1 tablet Daily for Prostate, Disp: 90 tablet, Rfl: 3 .  minoxidil (LONITEN) 10 MG tablet, TAKE 1 TABLET DAILY FOR BLOOD PRESSURE, Disp: 90 tablet, Rfl: 3 .  olmesartan (BENICAR) 40 MG tablet, Take 1/2 to 1 tablet Daily  for BP, Disp: 90 tablet, Rfl: 3 .  pantoprazole (PROTONIX) 40 MG tablet, TAKE 1 TABLET BY MOUTH EVERY DAY, Disp: 90 tablet, Rfl: 3 .  QUEtiapine (SEROQUEL) 50 MG tablet, TAKE 1 TABLET AT SUPPERTIME & MAY REPEAT AT BEDTIME IF NEEDED FOR SLEEP, Disp: 180 tablet, Rfl: 1 .  rosuvastatin (CRESTOR) 20 MG tablet, Take 1 tablet Daily for Cholesterol, Disp: 90 tablet, Rfl: 3 .  silver sulfADIAZINE (SILVADENE) 1 % cream, Apply to affected area Daily and cover with non-stick Telfa pads & secure with paper tape, Disp: 50 g, Rfl: 3 .  tamsulosin (FLOMAX) 0.4 MG CAPS capsule, TAKE 1 CAPSULE AT BEDTIME FOR PROSTATE & URINARY FREQUENCY, Disp: 90 capsule, Rfl: 3 .  traMADol-acetaminophen (ULTRACET) 37.5-325 MG tablet, Take 1 tablet 4 x /day ONLY if Needed for Pain, Disp: 20 tablet, Rfl: 0 Exam: Current vital signs: BP (!) 142/63   Pulse 81   Temp  (!) 96.4 F (35.8 C) (Temporal)   Resp 17   SpO2 91%  Vital signs in last 24 hours: Temp:  [96.4 F (35.8 C)] 96.4 F (35.8 C) (02/11 2210) Pulse Rate:  [68-118] 81 (02/11 2245) Resp:  [16-22] 17 (02/11 2245) BP: (110-142)/(56-67) 142/63 (02/11 2245) SpO2:  [65 %-99 %] 91 % (02/11 2245) General: Very drowsy does not open eyes to voice. HEENT: Normocephalic atraumatic CVS: Regular rate rhythm Respiratory: Sonorous breathing at times but then breathes normally.  Saturations varied anywhere from 99% and dropped down to 70% and came right back up. Abdomen soft nondistended Extremities warm well perfused Neurological exam Very drowsy, does not open eyes to voice. Does not follow any commands Nonverbal Cranial nerves: Pupils equal round react light, extraocular movements difficult to assess, no gaze preference or deviation.  Face appears symmetric. Motor exam: Spontaneously moves all fours.  To noxious stimulation very strong withdrawal and localization in all 4 extremities. Sensory exam: As above  Coordination difficult to assess Gait testing deferred due to mentation NIH stroke scale 1a Level of Conscious.: 1 1b LOC Questions: 2 1c LOC Commands: 2 2 Best Gaze: 0 3 Visual: 0 4 Facial Palsy: 0 5a Motor Arm - left: 1 5b Motor Arm - Right: 1 6a Motor Leg - Left: 1 6b Motor Leg - Right: 1 7 Limb Ataxia: 0 8 Sensory: 0 9 Best Language: 3 10 Dysarthria: 2 11 Extinct. and Inatten.: 0 TOTAL: 14   Labs I have reviewed labs in epic and the results pertinent to this consultation are:  CBC    Component Value Date/Time   WBC 4.7 04/21/2019 2101   RBC 4.63 04/21/2019 2101   HGB 12.6 (L) 04/21/2019 2159   HCT 37.0 (L) 04/21/2019 2159   PLT 130 (L) 04/21/2019 2101   MCV 90.1 04/21/2019 2101   MCH 29.2 04/21/2019 2101   MCHC 32.4 04/21/2019 2101   RDW 13.7 04/21/2019 2101   LYMPHSABS 1.6 04/21/2019 2101   MONOABS 0.4 04/21/2019 2101   EOSABS 0.1 04/21/2019 2101   BASOSABS 0.0  04/21/2019 2101    CMP     Component Value Date/Time   NA 137 04/21/2019 2159   K 4.1 04/21/2019 2159   CL 105 04/21/2019 2119   CO2 21 (L) 04/21/2019 2101   GLUCOSE 109 (H) 04/21/2019 2119   BUN 25 (H) 04/21/2019 2119   CREATININE 1.80 (H) 04/21/2019 2119   CREATININE 2.65 (H) 03/08/2019 1121   CALCIUM 8.5 (L) 04/21/2019 2101   PROT 5.0 (L) 04/21/2019 2101   ALBUMIN 2.9 (L) 04/21/2019 2101   AST 10 (L) 04/21/2019 2101   ALT 11 04/21/2019 2101   ALKPHOS 39 04/21/2019 2101   BILITOT 0.6 04/21/2019 2101   GFRNONAA 35 (L) 04/21/2019 2101   GFRNONAA 21 (L) 03/08/2019 1121   GFRAA 40 (L) 04/21/2019 2101   GFRAA 25 (L) 03/08/2019 1121    Lipid Panel     Component Value Date/Time   CHOL 141 03/08/2019 1121   TRIG 82 03/08/2019 1121   HDL 58 03/08/2019 1121   CHOLHDL 2.4 03/08/2019 1121   VLDL 20 07/17/2017 0103   LDLCALC 67 03/08/2019 1121   Imaging I have reviewed the images obtained:  CT-scan of the brain-no acute changes.  Aspects 10 CTA head and neck-no emergent large vessel occlusion but there is atherosclerotic change in the carotid siphons with stenosis estimated at 50 to 70% on both sides.  There is severe and diffuse intracranial anterior circulation atherosclerotic disease with focal 50% stenosis of the left M1 segment and right A1 segment is also severely diseased. In the posterior circulation-right vertebral artery is probably acutely occluded distally with the left vertebral artery showing 50% stenosis at its origin.  There is severe atherosclerotic disease in the V4 segment.  Flow does reach the basilar but there is severe stenosis of the proximal basilar artery estimated at 75%.  Atherosclerotic changes more diffusely affecting the basilar artery are also seen.  There is diminished flow in the PCA regions particularly on the left with diminutive/occluded left PCA with minimal flow. CT perfusion-prolonged T-max in the left occipital lobe which could be due to the  diseased left PCA. Stat MRI of the brain with extensive white matter disease-no acute stroke.  Chest x-ray with chronic left pleural effusion and basilar atelectasis.  No new findings.  ABG unremarkable for severe hypoxia or hypercarbia.  Assessment:  86 year old man with past medical history of dementia, prior strokes with no  residual deficits, hypertension hyperlipidemia brought in for difficulty walking and slurred speech. No focal deficits reported on presentation but the patient was less responsive than his baseline.  He remained very drowsy and difficult to arouse by voice. Due to prior information in the chart of diseased posterior circulation, concern for basilar occlusion-CTA head and neck did not show basilar occlusion but did see a possible right vertebral artery occlusion and diminutive posterior circulation in general. Of note, patient's antihypertensive medications were very recently changed and his blood pressures which remained in the systolics [id number] to [id number] now dropped to 100s. I suspect that his current presentation is likely secondary to vertebrobasilar insufficiency and not truly due to a acute stroke-as the MRI of the brain done stat was negative for strokes. At this time, I think his current presentation is likely due to the hypoperfusion caused by vertebrobasilar insufficiency and hypotension.  Given the history of dementia and prior strokes, seizures cannot be ruled out although there is no witnessed seizure.  The family says that they have cameras all over the house and monitor him on the camera as he lives by himself with an attendant that tends to him during the daytime.   Impression: Encephalopathy-etiology under investigation-multifactorial including toxic metabolic causes. Vertebrobasilar insufficiency Hypertension, ?syncope  Recommendations: -I would recommend hydration and IV fluids to ensure that his systolic blood pressure remains over 140. -I would hold all  his antihypertensives for now and make sure that they are optimized prior to discharge to maintain systolic blood pressures over 140. -MRI brain was unremarkable for any acute stroke.  There are multiple anterior and posterior circulation stenosis and occlusions, with maybe a right vertebral occlusion being relatively newer but I do not think that he is symptomatic from a stroke but rather asymptomatic from hypoperfusion due to vertebrobasilar insufficiency. Thus maintaining blood pressures as above would be the most important part of his management. -Other than that, obtain an EEG, check labs to include ammonia, B12, TSH. -Frequent neurochecks -2D echocardiogram -Telemetry -Hold sedating medications -Continue home Plavix. -EEG in AM  -For outpatient follow-up, he will need an outpatient neurology evaluation after discharge.  He will need work-up for sleep apnea as well as dementia as he has extensive history of snoring, has low energy in the morning and based on history definitely has attributes of dementia that has progressed over period of time.  Discussed my plan in detail with the son and daughter who were in the hospital consultation room in the emergency department. Discussed my plan in detail with the ED provider Dr. Roslynn Amble.  -- Amie Portland, MD [facility] Pager: [phone] If 7pm to 7am, please call on call as listed on AMION.

## 2019-04-21 NOTE — ED Notes (Addendum)
Family consultation room B. EDP and Neuro informed.

## 2019-04-21 NOTE — ED Notes (Addendum)
Pt transported to MRI STAT per Dr. Rory Percy

## 2019-04-22 ENCOUNTER — Observation Stay (HOSPITAL_COMMUNITY): Payer: PPO

## 2019-04-22 ENCOUNTER — Encounter (HOSPITAL_COMMUNITY): Payer: Self-pay | Admitting: Student

## 2019-04-22 DIAGNOSIS — G9341 Metabolic encephalopathy: Secondary | ICD-10-CM | POA: Diagnosis present

## 2019-04-22 DIAGNOSIS — Z79899 Other long term (current) drug therapy: Secondary | ICD-10-CM | POA: Diagnosis not present

## 2019-04-22 DIAGNOSIS — G9340 Encephalopathy, unspecified: Secondary | ICD-10-CM | POA: Diagnosis present

## 2019-04-22 DIAGNOSIS — G450 Vertebro-basilar artery syndrome: Secondary | ICD-10-CM

## 2019-04-22 DIAGNOSIS — Z79891 Long term (current) use of opiate analgesic: Secondary | ICD-10-CM | POA: Diagnosis not present

## 2019-04-22 DIAGNOSIS — N184 Chronic kidney disease, stage 4 (severe): Secondary | ICD-10-CM | POA: Diagnosis present

## 2019-04-22 DIAGNOSIS — R4189 Other symptoms and signs involving cognitive functions and awareness: Secondary | ICD-10-CM | POA: Diagnosis present

## 2019-04-22 DIAGNOSIS — I10 Essential (primary) hypertension: Secondary | ICD-10-CM

## 2019-04-22 DIAGNOSIS — Z89421 Acquired absence of other right toe(s): Secondary | ICD-10-CM | POA: Diagnosis not present

## 2019-04-22 DIAGNOSIS — I130 Hypertensive heart and chronic kidney disease with heart failure and stage 1 through stage 4 chronic kidney disease, or unspecified chronic kidney disease: Secondary | ICD-10-CM | POA: Diagnosis present

## 2019-04-22 DIAGNOSIS — I342 Nonrheumatic mitral (valve) stenosis: Secondary | ICD-10-CM | POA: Diagnosis not present

## 2019-04-22 DIAGNOSIS — I5032 Chronic diastolic (congestive) heart failure: Secondary | ICD-10-CM

## 2019-04-22 DIAGNOSIS — F0281 Dementia in other diseases classified elsewhere with behavioral disturbance: Secondary | ICD-10-CM | POA: Diagnosis not present

## 2019-04-22 DIAGNOSIS — F0390 Unspecified dementia without behavioral disturbance: Secondary | ICD-10-CM | POA: Diagnosis not present

## 2019-04-22 DIAGNOSIS — I6523 Occlusion and stenosis of bilateral carotid arteries: Secondary | ICD-10-CM | POA: Diagnosis not present

## 2019-04-22 DIAGNOSIS — Z7902 Long term (current) use of antithrombotics/antiplatelets: Secondary | ICD-10-CM | POA: Diagnosis not present

## 2019-04-22 DIAGNOSIS — N400 Enlarged prostate without lower urinary tract symptoms: Secondary | ICD-10-CM | POA: Diagnosis present

## 2019-04-22 DIAGNOSIS — F05 Delirium due to known physiological condition: Secondary | ICD-10-CM | POA: Diagnosis present

## 2019-04-22 DIAGNOSIS — Z8673 Personal history of transient ischemic attack (TIA), and cerebral infarction without residual deficits: Secondary | ICD-10-CM | POA: Diagnosis not present

## 2019-04-22 DIAGNOSIS — K219 Gastro-esophageal reflux disease without esophagitis: Secondary | ICD-10-CM | POA: Diagnosis present

## 2019-04-22 DIAGNOSIS — R000 Tachycardia, unspecified: Secondary | ICD-10-CM | POA: Diagnosis present

## 2019-04-22 DIAGNOSIS — Z87891 Personal history of nicotine dependence: Secondary | ICD-10-CM | POA: Diagnosis not present

## 2019-04-22 DIAGNOSIS — E559 Vitamin D deficiency, unspecified: Secondary | ICD-10-CM | POA: Diagnosis present

## 2019-04-22 DIAGNOSIS — Z66 Do not resuscitate: Secondary | ICD-10-CM | POA: Diagnosis present

## 2019-04-22 DIAGNOSIS — I6501 Occlusion and stenosis of right vertebral artery: Secondary | ICD-10-CM | POA: Diagnosis not present

## 2019-04-22 DIAGNOSIS — Z888 Allergy status to other drugs, medicaments and biological substances status: Secondary | ICD-10-CM | POA: Diagnosis not present

## 2019-04-22 DIAGNOSIS — Z20822 Contact with and (suspected) exposure to covid-19: Secondary | ICD-10-CM | POA: Diagnosis present

## 2019-04-22 DIAGNOSIS — M1990 Unspecified osteoarthritis, unspecified site: Secondary | ICD-10-CM | POA: Diagnosis present

## 2019-04-22 DIAGNOSIS — I959 Hypotension, unspecified: Secondary | ICD-10-CM

## 2019-04-22 DIAGNOSIS — Z882 Allergy status to sulfonamides status: Secondary | ICD-10-CM | POA: Diagnosis not present

## 2019-04-22 DIAGNOSIS — E785 Hyperlipidemia, unspecified: Secondary | ICD-10-CM | POA: Diagnosis present

## 2019-04-22 DIAGNOSIS — Z981 Arthrodesis status: Secondary | ICD-10-CM | POA: Diagnosis not present

## 2019-04-22 DIAGNOSIS — F0391 Unspecified dementia with behavioral disturbance: Secondary | ICD-10-CM | POA: Diagnosis present

## 2019-04-22 LAB — URINE CULTURE: Culture: NO GROWTH

## 2019-04-22 LAB — CBC
HCT: 41.5 % (ref 39.0–52.0)
Hemoglobin: 13 g/dL (ref 13.0–17.0)
MCH: 29 pg (ref 26.0–34.0)
MCHC: 31.3 g/dL (ref 30.0–36.0)
MCV: 92.4 fL (ref 80.0–100.0)
Platelets: 105 K/uL — ABNORMAL LOW (ref 150–400)
RBC: 4.49 MIL/uL (ref 4.22–5.81)
RDW: 13.9 % (ref 11.5–15.5)
WBC: 4.6 K/uL (ref 4.0–10.5)
nRBC: 0 % (ref 0.0–0.2)

## 2019-04-22 LAB — URINALYSIS, ROUTINE W REFLEX MICROSCOPIC
Bacteria, UA: NONE SEEN
Bilirubin Urine: NEGATIVE
Glucose, UA: NEGATIVE mg/dL
Ketones, ur: NEGATIVE mg/dL
Nitrite: NEGATIVE
Protein, ur: NEGATIVE mg/dL
Specific Gravity, Urine: 1.025 (ref 1.005–1.030)
pH: 6 (ref 5.0–8.0)

## 2019-04-22 LAB — COMPREHENSIVE METABOLIC PANEL
ALT: 13 U/L (ref 0–44)
AST: 16 U/L (ref 15–41)
Albumin: 3 g/dL — ABNORMAL LOW (ref 3.5–5.0)
Alkaline Phosphatase: 43 U/L (ref 38–126)
Anion gap: 9 (ref 5–15)
BUN: 20 mg/dL (ref 8–23)
CO2: 25 mmol/L (ref 22–32)
Calcium: 8.7 mg/dL — ABNORMAL LOW (ref 8.9–10.3)
Chloride: 107 mmol/L (ref 98–111)
Creatinine, Ser: 1.84 mg/dL — ABNORMAL HIGH (ref 0.61–1.24)
GFR calc Af Amer: 38 mL/min — ABNORMAL LOW (ref >60)
GFR calc non Af Amer: 33 mL/min — ABNORMAL LOW (ref >60)
Glucose, Bld: 133 mg/dL — ABNORMAL HIGH (ref 70–99)
Potassium: 4.5 mmol/L (ref 3.5–5.1)
Sodium: 141 mmol/L (ref 135–145)
Total Bilirubin: 0.8 mg/dL (ref 0.3–1.2)
Total Protein: 5.3 g/dL — ABNORMAL LOW (ref 6.5–8.1)

## 2019-04-22 LAB — RAPID URINE DRUG SCREEN, HOSP PERFORMED
Amphetamines: NOT DETECTED
Barbiturates: NOT DETECTED
Benzodiazepines: NOT DETECTED
Cocaine: NOT DETECTED
Opiates: NOT DETECTED
Tetrahydrocannabinol: NOT DETECTED

## 2019-04-22 LAB — BRAIN NATRIURETIC PEPTIDE: B Natriuretic Peptide: 829.2 pg/mL — ABNORMAL HIGH (ref 0.0–100.0)

## 2019-04-22 LAB — BASIC METABOLIC PANEL
Anion gap: 7 (ref 5–15)
BUN: 21 mg/dL (ref 8–23)
CO2: 25 mmol/L (ref 22–32)
Calcium: 8.3 mg/dL — ABNORMAL LOW (ref 8.9–10.3)
Chloride: 107 mmol/L (ref 98–111)
Creatinine, Ser: 1.79 mg/dL — ABNORMAL HIGH (ref 0.61–1.24)
GFR calc Af Amer: 40 mL/min — ABNORMAL LOW (ref >60)
GFR calc non Af Amer: 34 mL/min — ABNORMAL LOW (ref >60)
Glucose, Bld: 120 mg/dL — ABNORMAL HIGH (ref 70–99)
Potassium: 5.1 mmol/L (ref 3.5–5.1)
Sodium: 139 mmol/L (ref 135–145)

## 2019-04-22 LAB — CREATININE, SERUM
Creatinine, Ser: 1.75 mg/dL — ABNORMAL HIGH (ref 0.61–1.24)
GFR calc Af Amer: 41 mL/min — ABNORMAL LOW (ref >60)
GFR calc non Af Amer: 35 mL/min — ABNORMAL LOW (ref >60)

## 2019-04-22 LAB — TROPONIN I (HIGH SENSITIVITY): Troponin I (High Sensitivity): 34 ng/L — ABNORMAL HIGH (ref <18)

## 2019-04-22 LAB — CORTISOL: Cortisol, Plasma: 4.1 ug/dL

## 2019-04-22 LAB — VITAMIN B12: Vitamin B-12: 210 pg/mL (ref 180–914)

## 2019-04-22 LAB — GLUCOSE, CAPILLARY: Glucose-Capillary: 107 mg/dL — ABNORMAL HIGH (ref 70–99)

## 2019-04-22 LAB — PHOSPHORUS: Phosphorus: 4.9 mg/dL — ABNORMAL HIGH (ref 2.5–4.6)

## 2019-04-22 LAB — MAGNESIUM: Magnesium: 2 mg/dL (ref 1.7–2.4)

## 2019-04-22 LAB — TSH: TSH: 4.536 u[IU]/mL — ABNORMAL HIGH (ref 0.350–4.500)

## 2019-04-22 LAB — AMMONIA
Ammonia: 23 umol/L (ref 9–35)
Ammonia: 28 umol/L (ref 9–35)

## 2019-04-22 LAB — SARS CORONAVIRUS 2 (TAT 6-24 HRS): SARS Coronavirus 2: NEGATIVE

## 2019-04-22 MED ORDER — HEPARIN SODIUM (PORCINE) 5000 UNIT/ML IJ SOLN
5000.0000 [IU] | Freq: Three times a day (TID) | INTRAMUSCULAR | Status: DC
  Administered 2019-04-22 – 2019-04-26 (×14): 5000 [IU] via SUBCUTANEOUS
  Filled 2019-04-22 (×14): qty 1

## 2019-04-22 MED ORDER — DEXTROSE-NACL 5-0.45 % IV SOLN: INTRAVENOUS | Status: DC

## 2019-04-22 MED ORDER — IPRATROPIUM-ALBUTEROL 0.5-2.5 (3) MG/3ML IN SOLN: 3.0000 mL | Freq: Four times a day (QID) | RESPIRATORY_TRACT | Status: DC | PRN

## 2019-04-22 MED ORDER — THIAMINE HCL 100 MG/ML IJ SOLN
250.0000 mg | INTRAVENOUS | Status: DC
  Administered 2019-04-22 – 2019-04-25 (×4): 250 mg via INTRAVENOUS
  Filled 2019-04-22 (×6): qty 2.5

## 2019-04-22 MED ORDER — METOPROLOL TARTRATE 5 MG/5ML IV SOLN
5.0000 mg | INTRAVENOUS | Status: AC | PRN
  Administered 2019-04-22 (×2): 5 mg via INTRAVENOUS
  Filled 2019-04-22 (×2): qty 5

## 2019-04-22 NOTE — H&P (Signed)
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 frontal view of the chest demonstrates a stable cardiac silhouette. Chronic small left pleural effusion and left basilar consolidation. No pneumothorax. Right chest is clear. IMPRESSION: 1. Chronic small left pleural effusion and left basilar atelectasis. 2. Otherwise no acute process. Electronically Signed   By: Randa Ngo M.D.   On: 04/21/2019 22:03   CT HEAD CODE STROKE WO CONTRAST  Result Date: 04/21/2019 CLINICAL DATA:  Code stroke. Slurred speech and unsteady gait. Last seen normal 1930 hours EXAM: CT HEAD WITHOUT CONTRAST TECHNIQUE: Contiguous axial images were obtained from the base of the skull through the vertex without intravenous contrast. COMPARISON:  07/16/2017 head CT FINDINGS: Brain: There is no mass, hemorrhage or extra-axial collection. There is generalized atrophy without lobar predilection. There is hypoattenuation of the periventricular white matter, most commonly indicating chronic ischemic microangiopathy. There is an old left thalamus small vessel infarct. Vascular: No abnormal hyperdensity of the major intracranial  arteries or dural venous sinuses. No intracranial atherosclerosis. Skull: The visualized skull base, calvarium and extracranial soft tissues are normal. Sinuses/Orbits: No fluid levels or advanced mucosal thickening of the visualized paranasal sinuses. No mastoid or middle ear effusion. The orbits are normal. ASPECTS [facility] Stroke Program Early CT Score) - Ganglionic level infarction (caudate, lentiform nuclei, internal capsule, insula, M1-M3 cortex): 7 - Supraganglionic infarction (M4-M6 cortex): 3 Total score (0-10 with 10 being normal): 10 IMPRESSION: 1. No acute finding. 2. Chronic ischemic microangiopathy and generalized volume loss. 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 3. ASPECTS is 10. These results were communicated to Dr. Amie Portland at 9:24 pm on 04/21/2019 by text page via the [facility] messaging system. Electronically Signed   By: Ulyses Jarred M.D.   On: 04/21/2019 21:25    Active Problems:   Unresponsiveness   Assessment/Plan Unresponsiveness: -Suspect secondary to medication overuse/overdose. -Admit patient for further assessment and management. -Urine drug screening. -Check ammonia level. -EEG has been ordered -Avoid mind altering medications -Neurology input is appreciated -Further management will depend on hospital course.  Chronic kidney disease stage IV: -Patient's renal function actually looks  better today. -Continue to monitor closely.  History of CVA: Imaging work done today is nonrevealing.  Hypertension: -This was initially said to be significantly uncontrolled. -Blood pressure has been optimized.  Dementia: Unresponsive at the moment. Behavioral protein remains unknown. Manage expectantly.  DVT prophylaxis: Subcu heparin Code Status: DO NOT INTUBATE and DO NOT RESUSCITATE Family Communication:  Disposition Plan: This will depend on hospital course Consults called: Neurology has seen the patient Admission status: Observation  Time spent: 65  minutes  Dana Allan, MD  [facility] Pager #: [phone] 7PM-7AM contact night coverage as above  04/22/2019, 12:34 AM

## 2019-04-22 NOTE — Progress Notes (Addendum)
NEURO HOSPITALIST PROGRESS NOTE   Subjective: Patient in bed snoring, mouth open, NAD on 3L [place]. He will follow some simple commands.  Exam: Vitals:   04/22/19 0600 04/22/19 0645  BP: 139/63 (!) 132/53  Pulse: 72 71  Resp: 18 (!) 22  Temp:    SpO2: 97% 98%    Physical Exam  Constitutional: Appears well-developed and well-nourished.  Psych: Affect appropriate to situation Eyes: Normal external eye and conjunctiva. HENT: Normocephalic, no lesions, without obvious abnormality.   Musculoskeletal-no joint tenderness, deformity or swelling Cardiovascular: Normal rate and regular rhythm.  Respiratory: Effort normal, non-labored breathing saturations WNL on 3L [place] GI: Soft.  No distension. There is no tenderness.  Skin: WDI   Neuro:  Mental Status: Drowsy. Not oriented. Does not open eyes to voice.No attempts to verbalize, but will follow some simple commands ( stick your tongue out, raise your legs, squeeze my hands, and open eyes) Cranial Nerves: unable to test visual fields as patient mostly squeezes eyes shut.,  Quickly visualized pupils, PERRL Face appears symmetric,VIII: hearing normal bilaterally  midline tongue extension Motor/Sensory: Was able to raise legs off bed to command. Localizes and withdraws to sternal rub and noxious in all 4 extremities. Spontaneously moves all 4. Tone and bulk:normal tone throughout; no atrophy noted Deep Tendon Reflexes: 2+ and symmetric biceps, patella Cerebellar: UTA Gait: deferred    Medications:  Scheduled: . heparin  5,000 Units Subcutaneous Q8H  . sodium chloride flush  3 mL Intravenous Once     SN:[id number]  Pertinent Labs/Diagnostics:  EEG is pending.  Ammonia: WNL TSH: 4.536 ( slightly elevated) B12: WNL   CT Code Stroke CTA Head W/WO contrast  Result Date: 04/21/2019 CLINICAL DATA:  Slurred speech and unsteady gait. EXAM: CT ANGIOGRAPHY HEAD AND NECK CT PERFUSION BRAIN TECHNIQUE:  Multidetector CT imaging of the head and neck was performed using the standard protocol during bolus administration of intravenous contrast. Multiplanar CT image reconstructions and MIPs were obtained to evaluate the vascular anatomy. Carotid stenosis measurements (when applicable) are obtained utilizing NASCET criteria, using the distal internal carotid diameter as the denominator. Multiphase CT imaging of the brain was performed following IV bolus contrast injection. Subsequent parametric perfusion maps were calculated using RAPID software. CONTRAST:  1101mL OMNIPAQUE IOHEXOL 350 MG/ML SOLN COMPARISON:  Head CT earlier same day. FINDINGS: CTA NECK FINDINGS Aortic arch: Extensive aortic atherosclerosis. No aneurysm or dissection. Branching pattern is normal without flow limiting origin stenosis. Moderate stenosis of the proximal left subclavian artery. Right carotid system: Common carotid artery is widely patent to the bifurcation. Calcified plaque at the ICA bulb but no stenosis. Cervical ICA is tortuous but widely patent. Left carotid system: Common carotid artery widely patent to the bifurcation. Calcified plaque at the carotid bifurcation but no stenosis. Cervical ICA is tortuous but widely patent. Vertebral arteries: Right vertebral artery origin is widely patent. Calcified plaque at the left vertebral artery origin but with stenosis of no more than 50%. The left vertebral artery shows flow through the cervical region to the foramen magnum. The right vertebral artery shows proximal flow but slow and diminished flow distally consistent with distal vertebral artery thrombosis. See below. Skeleton: Ankylosis of the cervical spine.  Previous ACDF C3-4. Other neck: No soft tissue mass or lymphadenopathy. Upper chest: Left pleural effusion layering dependently. Otherwise negative. Review of the MIP images confirms the above findings  CTA HEAD FINDINGS Anterior circulation: Both internal carotid arteries are patent  through the skull base and siphon regions. There is ordinary siphon atherosclerotic calcification with stenosis estimated at 50-70% on both sides, particularly in the supraclinoid internal carotid arteries. On the right, there is focal calcification at the ICA bifurcation but without stenosis greater than 50%. No large or medium vessel occlusion is identified. Distal intracranial vessels show atherosclerotic narrowing and irregularity. On the left, there is a 50-75% stenosis of the M1 segment. Posterior circulation: As noted below, the right vertebral artery is probably acutely occluded distally. The left vertebral artery shows irregular calcified plaque in the V4 segment, possibly flow limiting. Flow from the left vertebral artery does reach the basilar. There is narrowing and irregularity of the proximal basilar artery, with stenosis greater than 75%. Beyond that, the basilar shows moderate narrowing and irregularity. Superior cerebellar arteries show flow. Very diminutive left PCA shows a small amount of flow. Distal left PCA branches not well seen. Venous sinuses: Patent and normal. Anatomic variants: None significant. Review of the MIP images confirms the above findings CT Brain Perfusion Findings: ASPECTS: 10 CBF (<30%) Volume: 32mL Perfusion (Tmax>6.0s) volume: 37mL Mismatch Volume: 57mL Infarction Location:No discernible infarction. Possible T-max prolongation in the left occipital lobe which could be consistent with the left PCA disease described above. Small focus also seen at the frontal brain and right temporal lobe region probably artifactual. IMPRESSION: Aortic atherosclerosis. 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 No significant carotid bifurcation disease. Atherosclerotic change in the carotid siphon regions with stenosis estimated at 50-70% on both sides. Severe and diffuse intracranial anterior circulation atherosclerotic disease. Vessels are diffusely narrowed. Focal 50% stenosis of  the left M1 segment. Right A1 segment severely diseased. Right vertebral artery is probably acutely occluded distally. Left vertebral artery shows a 50% stenosis at its origin. There is severe atherosclerotic disease in the V4 segment with severe stenosis. Flow does reach the basilar. 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 Atherosclerotic change more diffusely affecting the basilar artery. Diminished flow in the PCA regions, particularly on the left. Very diminutive left PCA with minimal flow. Perfusion imaging does not show any definite completed infarction. Question of prolonged T-max in the left occipital lobe which could go along with the left PCA disease. Case discussed with Dr. Rory Percy during interpretation. Electronically Signed   By: Nelson Chimes M.D.   On: 04/21/2019 21:56   CT Code Stroke CTA Neck W/WO contrast  Result Date: 04/21/2019 CLINICAL DATA:  Slurred speech and unsteady gait. EXAM: CT ANGIOGRAPHY HEAD AND NECK CT PERFUSION BRAIN TECHNIQUE: Multidetector CT imaging of the head and neck was performed using the standard protocol during bolus administration of intravenous contrast. Multiplanar CT image reconstructions and MIPs were obtained to evaluate the vascular anatomy. Carotid stenosis measurements (when applicable) are obtained utilizing NASCET criteria, using the distal internal carotid diameter as the denominator. Multiphase CT imaging of the brain was performed following IV bolus contrast injection. Subsequent parametric perfusion maps were calculated using RAPID software. CONTRAST:  158mL OMNIPAQUE IOHEXOL 350 MG/ML SOLN COMPARISON:  Head CT earlier same day. FINDINGS: CTA NECK FINDINGS Aortic arch: Extensive aortic atherosclerosis. No aneurysm or dissection. Branching pattern is normal without flow limiting origin stenosis. Moderate stenosis of the proximal left subclavian artery. Right carotid system: Common carotid artery is widely patent to the bifurcation.  Calcified plaque at the ICA bulb but no stenosis. Cervical ICA is tortuous but widely patent. Left carotid system:  Common carotid artery widely patent to the bifurcation. Calcified plaque at the carotid bifurcation but no stenosis. Cervical ICA is tortuous but widely patent. Vertebral arteries: Right vertebral artery origin is widely patent. Calcified plaque at the left vertebral artery origin but with stenosis of no more than 50%. The left vertebral artery shows flow through the cervical region to the foramen magnum. The right vertebral artery shows proximal flow but slow and diminished flow distally consistent with distal vertebral artery thrombosis. See below. Skeleton: Ankylosis of the cervical spine.  Previous ACDF C3-4. Other neck: No soft tissue mass or lymphadenopathy. Upper chest: Left pleural effusion layering dependently. Otherwise negative. Review of the MIP images confirms the above findings CTA HEAD FINDINGS Anterior circulation: Both internal carotid arteries are patent through the skull base and siphon regions. There is ordinary siphon atherosclerotic calcification with stenosis estimated at 50-70% on both sides, particularly in the supraclinoid internal carotid arteries. On the right, there is focal calcification at the ICA bifurcation but without stenosis greater than 50%. No large or medium vessel occlusion is identified. Distal intracranial vessels show atherosclerotic narrowing and irregularity. On the left, there is a 50-75% stenosis of the M1 segment. Posterior circulation: As noted below, the right vertebral artery is probably acutely occluded distally. The left vertebral artery shows irregular calcified plaque in the V4 segment, possibly flow limiting. Flow from the left vertebral artery does reach the basilar. There is narrowing and irregularity of the proximal basilar artery, with stenosis greater than 75%. Beyond that, the basilar shows moderate narrowing and irregularity. Superior  cerebellar arteries show flow. Very diminutive left PCA shows a small amount of flow. Distal left PCA branches not well seen. Venous sinuses: Patent and normal. Anatomic variants: None significant. Review of the MIP images confirms the above findings CT Brain Perfusion Findings: ASPECTS: 10 CBF (<30%) Volume: 28mL Perfusion (Tmax>6.0s) volume: 72mL Mismatch Volume: 17mL Infarction Location:No discernible infarction. Possible T-max prolongation in the left occipital lobe which could be consistent with the left PCA disease described above. Small focus also seen at the frontal brain and right temporal lobe region probably artifactual. IMPRESSION: Aortic atherosclerosis. 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 No significant carotid bifurcation disease. Atherosclerotic change in the carotid siphon regions with stenosis estimated at 50-70% on both sides. Severe and diffuse intracranial anterior circulation atherosclerotic disease. Vessels are diffusely narrowed. Focal 50% stenosis of the left M1 segment. Right A1 segment severely diseased. Right vertebral artery is probably acutely occluded distally. Left vertebral artery shows a 50% stenosis at its origin. There is severe atherosclerotic disease in the V4 segment with severe stenosis. Flow does reach the basilar. 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 Atherosclerotic change more diffusely affecting the basilar artery. Diminished flow in the PCA regions, particularly on the left. Very diminutive left PCA with minimal flow. Perfusion imaging does not show any definite completed infarction. Question of prolonged T-max in the left occipital lobe which could go along with the left PCA disease. Case discussed with Dr. Rory Percy during interpretation. Electronically Signed   By: Nelson Chimes M.D.   On: 04/21/2019 21:56   MR BRAIN WO CONTRAST  Result Date: 04/21/2019 CLINICAL DATA:  Encephalopathy. Gait disturbance. Speech disturbance.  Intermittent consciousness. EXAM: MRI HEAD WITHOUT CONTRAST TECHNIQUE: Multiplanar, multiecho pulse sequences of the brain and surrounding structures were obtained without intravenous contrast. COMPARISON:  CT studies same day FINDINGS: Brain: Diffusion imaging does not show any acute or subacute infarction at this time.  There are a few old small vessel cerebellar infarctions on the right. Minimal small vessel change affects the pons. Cerebral hemispheres show old lacunar infarctions in the thalami and in the right basal ganglia. There chronic small-vessel ischemic changes affecting the cerebral hemispheric deep and subcortical white matter. There is an old right occipital cortical and subcortical infarction. No mass lesion, hydrocephalus or extra-axial collection. There are a few small foci of hemosiderin deposition associated with some of the old small vessel infarctions. Vascular: Abnormal flow in the right vertebral artery as shown on CT. Other major vessels show flow. Skull and upper cervical spine: Negative Sinuses/Orbits: Clear/normal Other: None IMPRESSION: No acute infarction evident yet by MRI. Extensive chronic small-vessel ischemic changes throughout the brain as outlined above. Old right occipital cortical and subcortical infarction. Abnormal flow in the right vertebral artery. Electronically Signed   By: Nelson Chimes M.D.   On: 04/21/2019 22:39   CT Code Stroke Cerebral Perfusion with contrast  Result Date: 04/21/2019 CLINICAL DATA:  Slurred speech and unsteady gait. EXAM: CT ANGIOGRAPHY HEAD AND NECK CT PERFUSION BRAIN TECHNIQUE: Multidetector CT imaging of the head and neck was performed using the standard protocol during bolus administration of intravenous contrast. Multiplanar CT image reconstructions and MIPs were obtained to evaluate the vascular anatomy. Carotid stenosis measurements (when applicable) are obtained utilizing NASCET criteria, using the distal internal carotid diameter as the  denominator. Multiphase CT imaging of the brain was performed following IV bolus contrast injection. Subsequent parametric perfusion maps were calculated using RAPID software. CONTRAST:  14mL OMNIPAQUE IOHEXOL 350 MG/ML SOLN COMPARISON:  Head CT earlier same day. FINDINGS: CTA NECK FINDINGS Aortic arch: Extensive aortic atherosclerosis. No aneurysm or dissection. Branching pattern is normal without flow limiting origin stenosis. Moderate stenosis of the proximal left subclavian artery. Right carotid system: Common carotid artery is widely patent to the bifurcation. Calcified plaque at the ICA bulb but no stenosis. Cervical ICA is tortuous but widely patent. Left carotid system: Common carotid artery widely patent to the bifurcation. Calcified plaque at the carotid bifurcation but no stenosis. Cervical ICA is tortuous but widely patent. Vertebral arteries: Right vertebral artery origin is widely patent. Calcified plaque at the left vertebral artery origin but with stenosis of no more than 50%. The left vertebral artery shows flow through the cervical region to the foramen magnum. The right vertebral artery shows proximal flow but slow and diminished flow distally consistent with distal vertebral artery thrombosis. See below. Skeleton: Ankylosis of the cervical spine.  Previous ACDF C3-4. Other neck: No soft tissue mass or lymphadenopathy. Upper chest: Left pleural effusion layering dependently. Otherwise negative. Review of the MIP images confirms the above findings CTA HEAD FINDINGS Anterior circulation: Both internal carotid arteries are patent through the skull base and siphon regions. There is ordinary siphon atherosclerotic calcification with stenosis estimated at 50-70% on both sides, particularly in the supraclinoid internal carotid arteries. On the right, there is focal calcification at the ICA bifurcation but without stenosis greater than 50%. No large or medium vessel occlusion is identified. Distal  intracranial vessels show atherosclerotic narrowing and irregularity. On the left, there is a 50-75% stenosis of the M1 segment. Posterior circulation: As noted below, the right vertebral artery is probably acutely occluded distally. The left vertebral artery shows irregular calcified plaque in the V4 segment, possibly flow limiting. Flow from the left vertebral artery does reach the basilar. There is narrowing and irregularity of the proximal basilar artery, with stenosis greater than 75%. Beyond that,  the basilar shows moderate narrowing and irregularity. Superior cerebellar arteries show flow. Very diminutive left PCA shows a small amount of flow. Distal left PCA branches not well seen. Venous sinuses: Patent and normal. Anatomic variants: None significant. Review of the MIP images confirms the above findings CT Brain Perfusion Findings: ASPECTS: 10 CBF (<30%) Volume: 52mL Perfusion (Tmax>6.0s) volume: 3mL Mismatch Volume: 43mL Infarction Location:No discernible infarction. Possible T-max prolongation in the left occipital lobe which could be consistent with the left PCA disease described above. Small focus also seen at the frontal brain and right temporal lobe region probably artifactual. IMPRESSION: Aortic atherosclerosis. 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 No significant carotid bifurcation disease. Atherosclerotic change in the carotid siphon regions with stenosis estimated at 50-70% on both sides. Severe and diffuse intracranial anterior circulation atherosclerotic disease. Vessels are diffusely narrowed. Focal 50% stenosis of the left M1 segment. Right A1 segment severely diseased. Right vertebral artery is probably acutely occluded distally. Left vertebral artery shows a 50% stenosis at its origin. There is severe atherosclerotic disease in the V4 segment with severe stenosis. Flow does reach the basilar. 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 Atherosclerotic  change more diffusely affecting the basilar artery. Diminished flow in the PCA regions, particularly on the left. Very diminutive left PCA with minimal flow. Perfusion imaging does not show any definite completed infarction. Question of prolonged T-max in the left occipital lobe which could go along with the left PCA disease. Case discussed with Dr. Rory Percy during interpretation. Electronically Signed   By: Nelson Chimes M.D.   On: 04/21/2019 21:56   DG Chest Portable 1 View  Result Date: 04/21/2019 CLINICAL DATA:  Short of breath, unresponsive EXAM: PORTABLE CHEST 1 VIEW COMPARISON:  05/09/2018 FINDINGS: Single frontal view of the chest demonstrates a stable cardiac silhouette. Chronic small left pleural effusion and left basilar consolidation. No pneumothorax. Right chest is clear. IMPRESSION: 1. Chronic small left pleural effusion and left basilar atelectasis. 2. Otherwise no acute process. Electronically Signed   By: Randa Ngo M.D.   On: 04/21/2019 22:03   CT HEAD CODE STROKE WO CONTRAST  Result Date: 04/21/2019 CLINICAL DATA:  Code stroke. Slurred speech and unsteady gait. Last seen normal 1930 hours EXAM: CT HEAD WITHOUT CONTRAST TECHNIQUE: Contiguous axial images were obtained from the base of the skull through the vertex without intravenous contrast. COMPARISON:  07/16/2017 head CT FINDINGS: Brain: There is no mass, hemorrhage or extra-axial collection. There is generalized atrophy without lobar predilection. There is hypoattenuation of the periventricular white matter, most commonly indicating chronic ischemic microangiopathy. There is an old left thalamus small vessel infarct. Vascular: No abnormal hyperdensity of the major intracranial arteries or dural venous sinuses. No intracranial atherosclerosis. Skull: The visualized skull base, calvarium and extracranial soft tissues are normal. Sinuses/Orbits: No fluid levels or advanced mucosal thickening of the visualized paranasal sinuses. No mastoid  or middle ear effusion. The orbits are normal. ASPECTS [facility] Stroke Program Early CT Score) - Ganglionic level infarction (caudate, lentiform nuclei, internal capsule, insula, M1-M3 cortex): 7 - Supraganglionic infarction (M4-M6 cortex): 3 Total score (0-10 with 10 being normal): 10 IMPRESSION: 1. No acute finding. 2. Chronic ischemic microangiopathy and generalized volume loss. 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 3. ASPECTS is 10. These results were communicated to Dr. Amie Portland at 9:24 pm on 04/21/2019 by text page via the [facility] messaging system. Electronically Signed   By: Ulyses Jarred M.D.   On: 04/21/2019 21:25  Assessment:  86 year old man with past medical history of dementia, prior strokes with no residual deficits, hypertension hyperlipidemia brought in for difficulty walking and slurred speech. No focal deficits reported on presentation but the patient was less responsive than his baseline. He remained very drowsy and difficult to arouse by voice. Patient had a CTA d/t concern for basilar occlusion. CTA did not show Basilar occlusion but was concerning for possible right vertebral artery occlusion. Of note, patient's antihypertensive medications were very recently changed and his blood pressures which remained in the systolics [id number] to [id number] now dropped to 100s. I suspect that his current presentation is likely secondary to vertebrobasilar insufficiency and not truly due to a acute stroke-as the MRI of the brain done stat was negative for strokes. At this time, I think his current presentation is likely due to the hypoperfusion caused by vertebrobasilar insufficiency and hypotension. Mri did not show an acute stroke. Given the history of dementia and prior strokes, seizures cannot be ruled out although there is no witnessed seizure.  The family says that they have cameras all over the house and monitor him on the camera as he lives by himself with an attendant that tends to him during  the daytime.   Impression: Encephalopathy-etiology under investigation-multifactorial including toxic metabolic causes. 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 -Outpatient follow-up for possible dementia/sleep apnea   Laurey Morale, MSN, NP-C [facility] [phone]  Attending neurologist's note to follow  04/22/2019, 9:00 AM   NEUROHOSPITALIST ADDENDUM Performed a face to face diagnostic evaluation in the evening of 2/12  I have reviewed the contents of history and physical exam as documented by PA/ARNP/Resident and agree with above documentation.  I have discussed and formulated the above plan as documented. Edits to the note have been made as needed.  EEG negative for seizures, MRI negative for acute stroke.  Likely toxic metabolic encephalopathy. B12 level low. Recommend B12 level correction.      Karena Addison Kiyon Fidalgo MD [facility] [id number]   If 7pm to 7am, please call on call as listed on AMION.

## 2019-04-22 NOTE — Progress Notes (Signed)
PROGRESS NOTE  Lance Ramirez [id number] DOB: 1933-05-22   PCP: Unk Pinto, MD  Patient is from: home.  Has home care  DOA: 04/21/2019 LOS: 0  Brief Narrative / Interim history: 86 year old male with history of advanced dementia, CVA, CKD-4, diastolic CHF, HTN, BPH and HLD brought to ED with acute confusion, garbled speech and difficulty following command the evening of presentation to ED on 2/11.  Reportedly in his usual state of health up until that time although he has been staying up most of the night lately.  Wife passed away about a month ago.  Has been repeatedly saying " I am leaving to be with your mother" to his daughter Lately.  He has a Emergency planning/management officer who administers his medications.  Daughter doubts about overdose.   In ED, hemodynamically stable.  CMP, CBC ABG and UA without acute significant finding. Troponin and EKG not impressive., lactic acid, B12, ammonia and UDS normal. CT head without acute finding.  COVID-19 negative.  TSH not impressive.  Cortisol 4.1 (slightly low).  CTA head and neck with severe proximal basilar artery stenosis, acute right vertebral artery occlusion and 50 to 70% bilateral carotid artery stenosis.  Neurology consulted and admitted for acute encephalopathy.  EEG with encephalopathy but no seizure activity.  Subjective: Remains encephalopathic.  Grimaces to noxious stimuli but able to squeeze my fingers with both arms when asked to do so.  Could not follow other commands.   Objective: Vitals:   04/22/19 0645 04/22/19 0900 04/22/19 0945 04/22/19 1101  BP: (!) 132/53 121/61 (!) 108/49 (!) 115/54  Pulse: 71 79 75 76  Resp: (!) 22 (!) 25 (!) 33 20  Temp:    97.7 F (36.5 C)  TempSrc:    Axillary  SpO2: 98% 96% 96% 97%   No intake or output data in the 24 hours ending 04/22/19 1605 There were no vitals filed for this visit.  Examination:  GENERAL: No apparent distress.  Somnolent. HEENT: MMM.  Hearing grossly intact. NECK: Supple.   No apparent JVD.  RESP:  No IWOB. Good air movement bilaterally. CVS:  RRR. Heart sounds normal.  ABD/GI/GU: Bowel sounds present. Soft. Non tender.  MSK/EXT:  Moves extremities. No apparent deformity. No edema.  SKIN: no apparent skin lesion or wound NEURO: Somnolent.  Grimaces to noxious stimuli.  Able to squeeze my fingers with both arms.  Did not follow other commands.  PERRL.  No facial asymmetry.  Patellar reflex symmetric. PSYCH: Somnolent.  No apparent distress.  Procedures:  None  Assessment & Plan: Acute metabolic encephalopathy in patient with advanced dementia: unclear despite extensive work-up including MRI brain, EEG, UDS, ammonia, B12 and TSH.  CTA head and neck as below. Cortisol low Concern about delirium.  Catatonia?  Lost his wife about a month ago.   -Neurology following. -Psychiatry consult ordered. -Check RPR and thiamine level.  Start high-dose thiamine -Fall, aspiration and delirium precautions. -N.p.o. until able to swallow safely. -D5-1/2NS at 125 cc an hour. -PT/OT/SLP eval when able to -Repeat a.m. cortisol.  Vertebrobasilar insufficiency Bilateral carotid artery stenosis -CTA 75% proximal basilar a. stenosis, "acute" right VA occlusion and 50 to 70% bilateral CA stenosis -MRI brain without acute finding. -Neurology recommended SBP above 140 -IV fluid as above  History of CVA: MRI negative for CVA.  CT findings as above. -Resume Plavix and aspirin when able to take p.o.  Chronic diastolic CHF: Echo in [id number] with EF of 60 to 65% and G1 DD, moderate LAE, PAPP  40. BNP 830 (529 in 2019).  No respiratory distress.  No pulmonary congestion on CXR.  Not on diuretics as above. -IV fluid as above -Closely monitor fluid status  -Echocardiogram  CKD-4: Cr better than baseline. -Continue monitoring  Essential hypertension: On multiple antihypertensive medications at home.  Soft blood pressure today. -IV fluid as above  Mood disorder -Hold home medications  while NPO.  BPH: On Proscar and Cardura at home. -Monitor urine output -Bladder scan as needed.                 DVT prophylaxis: Subcu heparin Code Status: DNR/DNI Family Communication: Updated patient's daughter over the phone.  Discharge barrier: Persistent encephalopathy Patient is from: Home Final disposition: To be determined based on clinical progress.  Consultants: Neurology, psychiatry   Microbiology summarized: COVID-19 negative. Influenza PCR negative. Urine culture pending.  Sch Meds:  Scheduled Meds: . heparin  5,000 Units Subcutaneous Q8H  . sodium chloride flush  3 mL Intravenous Once   Continuous Infusions: . dextrose 5 % and 0.45% NaCl 75 mL/hr at 04/22/19 1239  . thiamine injection     PRN Meds:.ipratropium-albuterol  Antimicrobials: Anti-infectives (From admission, onward)   None       I have personally reviewed the following labs and images: CBC: Recent Labs  Lab 04/21/19 2101 04/21/19 2119 04/21/19 2159 04/22/19 0355  WBC 4.7  --   --  4.6  NEUTROABS 2.6  --   --   --   HGB 13.5 13.6 12.6* 13.0  HCT 41.7 40.0 37.0* 41.5  MCV 90.1  --   --  92.4  PLT 130*  --   --  105*   BMP &GFR Recent Labs  Lab 04/21/19 2101 04/21/19 2119 04/21/19 2159 04/22/19 0130 04/22/19 0355  NA 138 138 137  --  139  K 4.2 4.3 4.1  --  5.1  CL 108 105  --   --  107  CO2 21*  --   --   --  25  GLUCOSE 122* 109*  --   --  120*  BUN 21 25*  --   --  21  CREATININE 1.78* 1.80*  --  1.75* 1.79*  CALCIUM 8.5*  --   --   --  8.3*  MG  --   --   --  2.0  --   PHOS  --   --   --  4.9*  --    CrCl cannot be calculated (Unknown ideal weight.). Liver & Pancreas: Recent Labs  Lab 04/21/19 2101  AST 10*  ALT 11  ALKPHOS 39  BILITOT 0.6  PROT 5.0*  ALBUMIN 2.9*   No results for input(s): LIPASE, AMYLASE in the last 168 hours. Recent Labs  Lab 04/21/19 2238 04/22/19 0043  AMMONIA 28 23   Diabetic: No results for input(s): HGBA1C in the  last 72 hours. Recent Labs  Lab 04/21/19 2105  GLUCAP 105*   Cardiac Enzymes: No results for input(s): CKTOTAL, CKMB, CKMBINDEX, TROPONINI in the last 168 hours. No results for input(s): PROBNP in the last 8760 hours. Coagulation Profile: Recent Labs  Lab 04/21/19 2101  INR 1.1   Thyroid Function Tests: Recent Labs    04/21/19 2247  TSH 4.536*   Lipid Profile: No results for input(s): CHOL, HDL, LDLCALC, TRIG, CHOLHDL, LDLDIRECT in the last 72 hours. Anemia Panel: Recent Labs    04/21/19 2238  VITAMINB12 210   Urine analysis:    Component Value Date/Time  COLORURINE YELLOW 04/22/2019 0008   APPEARANCEUR CLEAR 04/22/2019 0008   LABSPEC 1.025 04/22/2019 0008   PHURINE 6.0 04/22/2019 0008   GLUCOSEU NEGATIVE 04/22/2019 0008   HGBUR SMALL (A) 04/22/2019 0008   BILIRUBINUR NEGATIVE 04/22/2019 0008   KETONESUR NEGATIVE 04/22/2019 0008   PROTEINUR NEGATIVE 04/22/2019 0008   UROBILINOGEN 0.2 08/09/2013 1557   NITRITE NEGATIVE 04/22/2019 0008   LEUKOCYTESUR SMALL (A) 04/22/2019 0008   Sepsis Labs: Invalid input(s): PROCALCITONIN, [place]  Microbiology: Recent Results (from the past 240 hour(s))  SARS CORONAVIRUS 2 (TAT 6-24 HRS) Nasopharyngeal Nasopharyngeal Swab     Status: None   Collection Time: 04/21/19 11:48 PM   Specimen: Nasopharyngeal Swab  Result Value Ref Range Status   SARS Coronavirus 2 NEGATIVE NEGATIVE Final    Comment: (NOTE) SARS-CoV-2 target nucleic acids are NOT DETECTED. The SARS-CoV-2 RNA is generally detectable in upper and lower respiratory specimens during the acute phase of infection. Negative results do not preclude SARS-CoV-2 infection, do not rule out co-infections with other pathogens, and should not be used as the sole basis for treatment or other patient management decisions. Negative results must be combined with clinical observations, patient history, and epidemiological information. The expected result is Negative. Fact  Sheet for Patients: SugarRoll.be Fact Sheet for Healthcare Providers: https://www.woods-mathews.com/ This test is not yet approved or cleared by the [place] FDA and  has been authorized for detection and/or diagnosis of SARS-CoV-2 by FDA under an Emergency Use Authorization (EUA). This EUA will remain  in effect (meaning this test can be used) for the duration of the COVID-19 declaration under Section 56 4(b)(1) of the Act, 21 U.S.C. section 360bbb-3(b)(1), unless the authorization is terminated or revoked sooner. Performed at [facility], [place] [address], [place], [place] [address]     Radiology Studies: EEG  Result Date: 04/22/2019 Lora Havens, MD     04/22/2019 10:55 AM Patient Name: Amy Usher MRN: [id number] Epilepsy Attending: Lora Havens Referring Physician/Provider: Dr. Dana Allan Date: 04/22/2019 Duration: 24.02 minutes Patient history: He has-year-old male with history of prior strokes with no residual deficits who presented with difficulty walking and slurred speech.  MRI brain negative for stroke.  EEG to evaluate for seizures. Level of alertness: Awake, asleep AEDs during EEG study: None Technical aspects: This EEG study was done with scalp electrodes positioned according to the 10-20 International system of electrode placement. Electrical activity was acquired at a sampling rate of 500Hz  and reviewed with a high frequency filter of 70Hz  and a low frequency filter of 1Hz . EEG data were recorded continuously and digitally stored. Description: The posterior dominant rhythm consists of 8 Hz activity of moderate voltage (25-35 uV) seen predominantly in posterior head regions, symmetric and reactive to eye opening and eye closing.  EEG showed intermittent generalized 3-5 Hz theta-delta slowing.  Sleep was characterized by vertex waves, maximal frontocentral. Hyperventilation and photic stimulation were not performed.  Abnormality - Intermittent slow, generalized IMPRESSION: This study is suggestive of mild diffuse encephalopathy, nonspecific to etiology.  No seizures or epileptiform discharges were seen throughout the recording. Lora Havens   CT Code Stroke CTA Head W/WO contrast  Result Date: 04/21/2019 CLINICAL DATA:  Slurred speech and unsteady gait. EXAM: CT ANGIOGRAPHY HEAD AND NECK CT PERFUSION BRAIN TECHNIQUE: Multidetector CT imaging of the head and neck was performed using the standard protocol during bolus administration of intravenous contrast. Multiplanar CT image reconstructions and MIPs were obtained to evaluate the vascular anatomy. Carotid stenosis  measurements (when applicable) are obtained utilizing NASCET criteria, using the distal internal carotid diameter as the denominator. Multiphase CT imaging of the brain was performed following IV bolus contrast injection. Subsequent parametric perfusion maps were calculated using RAPID software. CONTRAST:  149mL OMNIPAQUE IOHEXOL 350 MG/ML SOLN COMPARISON:  Head CT earlier same day. FINDINGS: CTA NECK FINDINGS Aortic arch: Extensive aortic atherosclerosis. No aneurysm or dissection. Branching pattern is normal without flow limiting origin stenosis. Moderate stenosis of the proximal left subclavian artery. Right carotid system: Common carotid artery is widely patent to the bifurcation. Calcified plaque at the ICA bulb but no stenosis. Cervical ICA is tortuous but widely patent. Left carotid system: Common carotid artery widely patent to the bifurcation. Calcified plaque at the carotid bifurcation but no stenosis. Cervical ICA is tortuous but widely patent. Vertebral arteries: Right vertebral artery origin is widely patent. Calcified plaque at the left vertebral artery origin but with stenosis of no more than 50%. The left vertebral artery shows flow through the cervical region to the foramen magnum. The right vertebral artery shows proximal flow but slow and  diminished flow distally consistent with distal vertebral artery thrombosis. See below. Skeleton: Ankylosis of the cervical spine.  Previous ACDF C3-4. Other neck: No soft tissue mass or lymphadenopathy. Upper chest: Left pleural effusion layering dependently. Otherwise negative. Review of the MIP images confirms the above findings CTA HEAD FINDINGS Anterior circulation: Both internal carotid arteries are patent through the skull base and siphon regions. There is ordinary siphon atherosclerotic calcification with stenosis estimated at 50-70% on both sides, particularly in the supraclinoid internal carotid arteries. On the right, there is focal calcification at the ICA bifurcation but without stenosis greater than 50%. No large or medium vessel occlusion is identified. Distal intracranial vessels show atherosclerotic narrowing and irregularity. On the left, there is a 50-75% stenosis of the M1 segment. Posterior circulation: As noted below, the right vertebral artery is probably acutely occluded distally. The left vertebral artery shows irregular calcified plaque in the V4 segment, possibly flow limiting. Flow from the left vertebral artery does reach the basilar. There is narrowing and irregularity of the proximal basilar artery, with stenosis greater than 75%. Beyond that, the basilar shows moderate narrowing and irregularity. Superior cerebellar arteries show flow. Very diminutive left PCA shows a small amount of flow. Distal left PCA branches not well seen. Venous sinuses: Patent and normal. Anatomic variants: None significant. Review of the MIP images confirms the above findings CT Brain Perfusion Findings: ASPECTS: 10 CBF (<30%) Volume: 7mL Perfusion (Tmax>6.0s) volume: 52mL Mismatch Volume: 26mL Infarction Location:No discernible infarction. Possible T-max prolongation in the left occipital lobe which could be consistent with the left PCA disease described above. Small focus also seen at the frontal brain and  right temporal lobe region probably artifactual. IMPRESSION: Aortic atherosclerosis. 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 No significant carotid bifurcation disease. Atherosclerotic change in the carotid siphon regions with stenosis estimated at 50-70% on both sides. Severe and diffuse intracranial anterior circulation atherosclerotic disease. Vessels are diffusely narrowed. Focal 50% stenosis of the left M1 segment. Right A1 segment severely diseased. Right vertebral artery is probably acutely occluded distally. Left vertebral artery shows a 50% stenosis at its origin. There is severe atherosclerotic disease in the V4 segment with severe stenosis. Flow does reach the basilar. 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 Atherosclerotic change more diffusely affecting the basilar artery. Diminished flow in the PCA regions, particularly on the left. Very diminutive  left PCA with minimal flow. Perfusion imaging does not show any definite completed infarction. Question of prolonged T-max in the left occipital lobe which could go along with the left PCA disease. Case discussed with Dr. Rory Percy during interpretation. Electronically Signed   By: Nelson Chimes M.D.   On: 04/21/2019 21:56   CT Code Stroke CTA Neck W/WO contrast  Result Date: 04/21/2019 CLINICAL DATA:  Slurred speech and unsteady gait. EXAM: CT ANGIOGRAPHY HEAD AND NECK CT PERFUSION BRAIN TECHNIQUE: Multidetector CT imaging of the head and neck was performed using the standard protocol during bolus administration of intravenous contrast. Multiplanar CT image reconstructions and MIPs were obtained to evaluate the vascular anatomy. Carotid stenosis measurements (when applicable) are obtained utilizing NASCET criteria, using the distal internal carotid diameter as the denominator. Multiphase CT imaging of the brain was performed following IV bolus contrast injection. Subsequent parametric perfusion maps were calculated  using RAPID software. CONTRAST:  177mL OMNIPAQUE IOHEXOL 350 MG/ML SOLN COMPARISON:  Head CT earlier same day. FINDINGS: CTA NECK FINDINGS Aortic arch: Extensive aortic atherosclerosis. No aneurysm or dissection. Branching pattern is normal without flow limiting origin stenosis. Moderate stenosis of the proximal left subclavian artery. Right carotid system: Common carotid artery is widely patent to the bifurcation. Calcified plaque at the ICA bulb but no stenosis. Cervical ICA is tortuous but widely patent. Left carotid system: Common carotid artery widely patent to the bifurcation. Calcified plaque at the carotid bifurcation but no stenosis. Cervical ICA is tortuous but widely patent. Vertebral arteries: Right vertebral artery origin is widely patent. Calcified plaque at the left vertebral artery origin but with stenosis of no more than 50%. The left vertebral artery shows flow through the cervical region to the foramen magnum. The right vertebral artery shows proximal flow but slow and diminished flow distally consistent with distal vertebral artery thrombosis. See below. Skeleton: Ankylosis of the cervical spine.  Previous ACDF C3-4. Other neck: No soft tissue mass or lymphadenopathy. Upper chest: Left pleural effusion layering dependently. Otherwise negative. Review of the MIP images confirms the above findings CTA HEAD FINDINGS Anterior circulation: Both internal carotid arteries are patent through the skull base and siphon regions. There is ordinary siphon atherosclerotic calcification with stenosis estimated at 50-70% on both sides, particularly in the supraclinoid internal carotid arteries. On the right, there is focal calcification at the ICA bifurcation but without stenosis greater than 50%. No large or medium vessel occlusion is identified. Distal intracranial vessels show atherosclerotic narrowing and irregularity. On the left, there is a 50-75% stenosis of the M1 segment. Posterior circulation: As noted  below, the right vertebral artery is probably acutely occluded distally. The left vertebral artery shows irregular calcified plaque in the V4 segment, possibly flow limiting. Flow from the left vertebral artery does reach the basilar. There is narrowing and irregularity of the proximal basilar artery, with stenosis greater than 75%. Beyond that, the basilar shows moderate narrowing and irregularity. Superior cerebellar arteries show flow. Very diminutive left PCA shows a small amount of flow. Distal left PCA branches not well seen. Venous sinuses: Patent and normal. Anatomic variants: None significant. Review of the MIP images confirms the above findings CT Brain Perfusion Findings: ASPECTS: 10 CBF (<30%) Volume: 45mL Perfusion (Tmax>6.0s) volume: 61mL Mismatch Volume: 72mL Infarction Location:No discernible infarction. Possible T-max prolongation in the left occipital lobe which could be consistent with the left PCA disease described above. Small focus also seen at the frontal brain and right temporal lobe region probably artifactual. IMPRESSION: Aortic  atherosclerosis. 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 No significant carotid bifurcation disease. Atherosclerotic change in the carotid siphon regions with stenosis estimated at 50-70% on both sides. Severe and diffuse intracranial anterior circulation atherosclerotic disease. Vessels are diffusely narrowed. Focal 50% stenosis of the left M1 segment. Right A1 segment severely diseased. Right vertebral artery is probably acutely occluded distally. Left vertebral artery shows a 50% stenosis at its origin. There is severe atherosclerotic disease in the V4 segment with severe stenosis. Flow does reach the basilar. 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 Atherosclerotic change more diffusely affecting the basilar artery. Diminished flow in the PCA regions, particularly on the left. Very diminutive left PCA with minimal flow.  Perfusion imaging does not show any definite completed infarction. Question of prolonged T-max in the left occipital lobe which could go along with the left PCA disease. Case discussed with Dr. Rory Percy during interpretation. Electronically Signed   By: Nelson Chimes M.D.   On: 04/21/2019 21:56   MR BRAIN WO CONTRAST  Result Date: 04/21/2019 CLINICAL DATA:  Encephalopathy. Gait disturbance. Speech disturbance. Intermittent consciousness. EXAM: MRI HEAD WITHOUT CONTRAST TECHNIQUE: Multiplanar, multiecho pulse sequences of the brain and surrounding structures were obtained without intravenous contrast. COMPARISON:  CT studies same day FINDINGS: Brain: Diffusion imaging does not show any acute or subacute infarction at this time. There are a few old small vessel cerebellar infarctions on the right. Minimal small vessel change affects the pons. Cerebral hemispheres show old lacunar infarctions in the thalami and in the right basal ganglia. There chronic small-vessel ischemic changes affecting the cerebral hemispheric deep and subcortical white matter. There is an old right occipital cortical and subcortical infarction. No mass lesion, hydrocephalus or extra-axial collection. There are a few small foci of hemosiderin deposition associated with some of the old small vessel infarctions. Vascular: Abnormal flow in the right vertebral artery as shown on CT. Other major vessels show flow. Skull and upper cervical spine: Negative Sinuses/Orbits: Clear/normal Other: None IMPRESSION: No acute infarction evident yet by MRI. Extensive chronic small-vessel ischemic changes throughout the brain as outlined above. Old right occipital cortical and subcortical infarction. Abnormal flow in the right vertebral artery. Electronically Signed   By: Nelson Chimes M.D.   On: 04/21/2019 22:39   CT Code Stroke Cerebral Perfusion with contrast  Result Date: 04/21/2019 CLINICAL DATA:  Slurred speech and unsteady gait. EXAM: CT ANGIOGRAPHY HEAD  AND NECK CT PERFUSION BRAIN TECHNIQUE: Multidetector CT imaging of the head and neck was performed using the standard protocol during bolus administration of intravenous contrast. Multiplanar CT image reconstructions and MIPs were obtained to evaluate the vascular anatomy. Carotid stenosis measurements (when applicable) are obtained utilizing NASCET criteria, using the distal internal carotid diameter as the denominator. Multiphase CT imaging of the brain was performed following IV bolus contrast injection. Subsequent parametric perfusion maps were calculated using RAPID software. CONTRAST:  134mL OMNIPAQUE IOHEXOL 350 MG/ML SOLN COMPARISON:  Head CT earlier same day. FINDINGS: CTA NECK FINDINGS Aortic arch: Extensive aortic atherosclerosis. No aneurysm or dissection. Branching pattern is normal without flow limiting origin stenosis. Moderate stenosis of the proximal left subclavian artery. Right carotid system: Common carotid artery is widely patent to the bifurcation. Calcified plaque at the ICA bulb but no stenosis. Cervical ICA is tortuous but widely patent. Left carotid system: Common carotid artery widely patent to the bifurcation. Calcified plaque at the carotid bifurcation but no stenosis. Cervical ICA is tortuous but widely patent. Vertebral arteries:  Right vertebral artery origin is widely patent. Calcified plaque at the left vertebral artery origin but with stenosis of no more than 50%. The left vertebral artery shows flow through the cervical region to the foramen magnum. The right vertebral artery shows proximal flow but slow and diminished flow distally consistent with distal vertebral artery thrombosis. See below. Skeleton: Ankylosis of the cervical spine.  Previous ACDF C3-4. Other neck: No soft tissue mass or lymphadenopathy. Upper chest: Left pleural effusion layering dependently. Otherwise negative. Review of the MIP images confirms the above findings CTA HEAD FINDINGS Anterior circulation: Both  internal carotid arteries are patent through the skull base and siphon regions. There is ordinary siphon atherosclerotic calcification with stenosis estimated at 50-70% on both sides, particularly in the supraclinoid internal carotid arteries. On the right, there is focal calcification at the ICA bifurcation but without stenosis greater than 50%. No large or medium vessel occlusion is identified. Distal intracranial vessels show atherosclerotic narrowing and irregularity. On the left, there is a 50-75% stenosis of the M1 segment. Posterior circulation: As noted below, the right vertebral artery is probably acutely occluded distally. The left vertebral artery shows irregular calcified plaque in the V4 segment, possibly flow limiting. Flow from the left vertebral artery does reach the basilar. There is narrowing and irregularity of the proximal basilar artery, with stenosis greater than 75%. Beyond that, the basilar shows moderate narrowing and irregularity. Superior cerebellar arteries show flow. Very diminutive left PCA shows a small amount of flow. Distal left PCA branches not well seen. Venous sinuses: Patent and normal. Anatomic variants: None significant. Review of the MIP images confirms the above findings CT Brain Perfusion Findings: ASPECTS: 10 CBF (<30%) Volume: 40mL Perfusion (Tmax>6.0s) volume: 22mL Mismatch Volume: 39mL Infarction Location:No discernible infarction. Possible T-max prolongation in the left occipital lobe which could be consistent with the left PCA disease described above. Small focus also seen at the frontal brain and right temporal lobe region probably artifactual. IMPRESSION: Aortic atherosclerosis. 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 No significant carotid bifurcation disease. Atherosclerotic change in the carotid siphon regions with stenosis estimated at 50-70% on both sides. Severe and diffuse intracranial anterior circulation atherosclerotic disease. Vessels are  diffusely narrowed. Focal 50% stenosis of the left M1 segment. Right A1 segment severely diseased. Right vertebral artery is probably acutely occluded distally. Left vertebral artery shows a 50% stenosis at its origin. There is severe atherosclerotic disease in the V4 segment with severe stenosis. Flow does reach the basilar. 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 Atherosclerotic change more diffusely affecting the basilar artery. Diminished flow in the PCA regions, particularly on the left. Very diminutive left PCA with minimal flow. Perfusion imaging does not show any definite completed infarction. Question of prolonged T-max in the left occipital lobe which could go along with the left PCA disease. Case discussed with Dr. Rory Percy during interpretation. Electronically Signed   By: Nelson Chimes M.D.   On: 04/21/2019 21:56   DG Chest Portable 1 View  Result Date: 04/21/2019 CLINICAL DATA:  Short of breath, unresponsive EXAM: PORTABLE CHEST 1 VIEW COMPARISON:  05/09/2018 FINDINGS: Single frontal view of the chest demonstrates a stable cardiac silhouette. Chronic small left pleural effusion and left basilar consolidation. No pneumothorax. Right chest is clear. IMPRESSION: 1. Chronic small left pleural effusion and left basilar atelectasis. 2. Otherwise no acute process. Electronically Signed   By: Randa Ngo M.D.   On: 04/21/2019 22:03   CT HEAD CODE  STROKE WO CONTRAST  Result Date: 04/21/2019 CLINICAL DATA:  Code stroke. Slurred speech and unsteady gait. Last seen normal 1930 hours EXAM: CT HEAD WITHOUT CONTRAST TECHNIQUE: Contiguous axial images were obtained from the base of the skull through the vertex without intravenous contrast. COMPARISON:  07/16/2017 head CT FINDINGS: Brain: There is no mass, hemorrhage or extra-axial collection. There is generalized atrophy without lobar predilection. There is hypoattenuation of the periventricular white matter, most commonly indicating  chronic ischemic microangiopathy. There is an old left thalamus small vessel infarct. Vascular: No abnormal hyperdensity of the major intracranial arteries or dural venous sinuses. No intracranial atherosclerosis. Skull: The visualized skull base, calvarium and extracranial soft tissues are normal. Sinuses/Orbits: No fluid levels or advanced mucosal thickening of the visualized paranasal sinuses. No mastoid or middle ear effusion. The orbits are normal. ASPECTS [facility] Stroke Program Early CT Score) - Ganglionic level infarction (caudate, lentiform nuclei, internal capsule, insula, M1-M3 cortex): 7 - Supraganglionic infarction (M4-M6 cortex): 3 Total score (0-10 with 10 being normal): 10 IMPRESSION: 1. No acute finding. 2. Chronic ischemic microangiopathy and generalized volume loss. 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 3. ASPECTS is 10. These results were communicated to Dr. Amie Portland at 9:24 pm on 04/21/2019 by text page via the [facility] messaging system. Electronically Signed   By: Ulyses Jarred M.D.   On: 04/21/2019 21:25    45 minutes with more than 50% spent in reviewing records, counseling patient/family and coordinating care.   Krithi Bray T. [place]  If 7PM-7AM, please contact night-coverage [email or web address] Password [facility] 04/22/2019, 4:05 PM

## 2019-04-22 NOTE — Progress Notes (Signed)
EEG complete - results pending 

## 2019-04-22 NOTE — ED Notes (Signed)
EEG at bedside.

## 2019-04-22 NOTE — ED Notes (Signed)
Breakfast Ordered 

## 2019-04-22 NOTE — Progress Notes (Signed)
RN noticed HR was above 135 and then tele called that the pt was in SVT. Txt paged the MD and labs are ordered. Pt alert to voice.

## 2019-04-22 NOTE — Progress Notes (Signed)
RN walked in Pt rm  And he was moving his left arm. RN rubbed pt's chest and he opened his eyes for a minute. RN asked pt to squeeze with his right hand and he did. Pt was unable to do the left hand. RN will continue to assess.

## 2019-04-22 NOTE — Procedures (Signed)
Patient Name: Lance Ramirez  MRN: [id number]  Epilepsy Attending: Lora Havens  Referring Physician/Provider: Dr. Dana Allan Date: 04/22/2019 Duration: 24.02 minutes  Patient history: He has-year-old male with history of prior strokes with no residual deficits who presented with difficulty walking and slurred speech.  MRI brain negative for stroke.  EEG to evaluate for seizures.  Level of alertness: Awake, asleep  AEDs during EEG study: None  Technical aspects: This EEG study was done with scalp electrodes positioned according to the 10-20 International system of electrode placement. Electrical activity was acquired at a sampling rate of 500Hz  and reviewed with a high frequency filter of 70Hz  and a low frequency filter of 1Hz . EEG data were recorded continuously and digitally stored.   Description: The posterior dominant rhythm consists of 8 Hz activity of moderate voltage (25-35 uV) seen predominantly in posterior head regions, symmetric and reactive to eye opening and eye closing.  EEG showed intermittent generalized 3-5 Hz theta-delta slowing.  Sleep was characterized by vertex waves, maximal frontocentral. Hyperventilation and photic stimulation were not performed.  Abnormality - Intermittent slow, generalized  IMPRESSION: This study is suggestive of mild diffuse encephalopathy, nonspecific to etiology.  No seizures or epileptiform discharges were seen throughout the recording.  Lance Ramirez Lance Ramirez

## 2019-04-22 NOTE — ED Notes (Signed)
Patient resting with eyes closed.  Still with little movement.  Some movement of right arm but not purposeful.  Not opening eyes when called or touched.  No verbal response.

## 2019-04-22 NOTE — ED Notes (Signed)
Report called to [address].

## 2019-04-23 ENCOUNTER — Inpatient Hospital Stay (HOSPITAL_COMMUNITY): Payer: PPO

## 2019-04-23 DIAGNOSIS — D696 Thrombocytopenia, unspecified: Secondary | ICD-10-CM

## 2019-04-23 DIAGNOSIS — R000 Tachycardia, unspecified: Secondary | ICD-10-CM

## 2019-04-23 DIAGNOSIS — D631 Anemia in chronic kidney disease: Secondary | ICD-10-CM

## 2019-04-23 DIAGNOSIS — F03918 Unspecified dementia, unspecified severity, with other behavioral disturbance: Secondary | ICD-10-CM

## 2019-04-23 DIAGNOSIS — I342 Nonrheumatic mitral (valve) stenosis: Secondary | ICD-10-CM

## 2019-04-23 DIAGNOSIS — F0391 Unspecified dementia with behavioral disturbance: Secondary | ICD-10-CM

## 2019-04-23 DIAGNOSIS — I6501 Occlusion and stenosis of right vertebral artery: Secondary | ICD-10-CM

## 2019-04-23 DIAGNOSIS — F39 Unspecified mood [affective] disorder: Secondary | ICD-10-CM

## 2019-04-23 DIAGNOSIS — R0682 Tachypnea, not elsewhere classified: Secondary | ICD-10-CM

## 2019-04-23 LAB — MAGNESIUM: Magnesium: 2 mg/dL (ref 1.7–2.4)

## 2019-04-23 LAB — CBC
HCT: 42.8 % (ref 39.0–52.0)
Hemoglobin: 13.8 g/dL (ref 13.0–17.0)
MCH: 29.3 pg (ref 26.0–34.0)
MCHC: 32.2 g/dL (ref 30.0–36.0)
MCV: 90.9 fL (ref 80.0–100.0)
Platelets: 104 10*3/uL — ABNORMAL LOW (ref 150–400)
RBC: 4.71 MIL/uL (ref 4.22–5.81)
RDW: 13.9 % (ref 11.5–15.5)
WBC: 6.5 10*3/uL (ref 4.0–10.5)
nRBC: 0 % (ref 0.0–0.2)

## 2019-04-23 LAB — RENAL FUNCTION PANEL
Albumin: 2.8 g/dL — ABNORMAL LOW (ref 3.5–5.0)
Anion gap: 9 (ref 5–15)
BUN: 21 mg/dL (ref 8–23)
CO2: 24 mmol/L (ref 22–32)
Calcium: 8.8 mg/dL — ABNORMAL LOW (ref 8.9–10.3)
Chloride: 108 mmol/L (ref 98–111)
Creatinine, Ser: 1.86 mg/dL — ABNORMAL HIGH (ref 0.61–1.24)
GFR calc Af Amer: 38 mL/min — ABNORMAL LOW (ref >60)
GFR calc non Af Amer: 33 mL/min — ABNORMAL LOW (ref >60)
Glucose, Bld: 134 mg/dL — ABNORMAL HIGH (ref 70–99)
Phosphorus: 3.7 mg/dL (ref 2.5–4.6)
Potassium: 4.3 mmol/L (ref 3.5–5.1)
Sodium: 141 mmol/L (ref 135–145)

## 2019-04-23 LAB — GLUCOSE, CAPILLARY: Glucose-Capillary: 128 mg/dL — ABNORMAL HIGH (ref 70–99)

## 2019-04-23 LAB — ECHOCARDIOGRAM COMPLETE

## 2019-04-23 LAB — CORTISOL-AM, BLOOD: Cortisol - AM: 16.2 ug/dL (ref 6.7–22.6)

## 2019-04-23 LAB — RPR: RPR Ser Ql: NONREACTIVE

## 2019-04-23 MED ORDER — CYANOCOBALAMIN 1000 MCG/ML IJ SOLN
1000.0000 ug | Freq: Every day | INTRAMUSCULAR | Status: DC
  Administered 2019-04-23 – 2019-04-26 (×4): 1000 ug via INTRAMUSCULAR
  Filled 2019-04-23 (×4): qty 1

## 2019-04-23 MED ORDER — METOPROLOL TARTRATE 5 MG/5ML IV SOLN
2.5000 mg | Freq: Four times a day (QID) | INTRAVENOUS | Status: DC | PRN
  Administered 2019-04-23: 2.5 mg via INTRAVENOUS
  Filled 2019-04-23 (×2): qty 5

## 2019-04-23 NOTE — Progress Notes (Addendum)
NEURO HOSPITALIST PROGRESS NOTE   Subjective: Patient in awake, alert, NAD on 3L [place]. Son at bedside. Patient is more active today. He is able to talk, but you can not always understand what he is saying.  Exam: Vitals:   04/23/19 0816 04/23/19 0918  BP: (!) 149/93   Pulse:    Resp: (!) 26 (!) 22  Temp: 98 F (36.7 C)   SpO2: 98% 95%    Physical Exam  Constitutional: Appears well-developed and well-nourished.  Psych: Affect appropriate to situation Eyes: Normal external eye and conjunctiva. HENT: Normocephalic, no lesions, without obvious abnormality.  Oral mucosa dry  Musculoskeletal-no joint tenderness, deformity or swelling Cardiovascular: Normal rate and regular rhythm.  Respiratory: Effort normal, non-labored breathing saturations WNL on 3L [place] GI: Soft.  No distension. There is no tenderness.  Skin: WDI   Neuro:  Mental Status: Awake.oriented to name. Did not know age/month or year able to follow commands. Still displays some confusion. Cranial Nerves: VFF, PERRL , EOEMI Face appears symmetric, VIII: hearing normal bilaterally  midline tongue extension Motor/Sensory: Able to raise all 4 extremities anti gravity. Tone and bulk:normal tone throughout; no atrophy noted Deep Tendon Reflexes: 2+ and symmetric biceps, patella Cerebellar: No ataxia FNF Gait: deferred    Medications:  Scheduled: . cyanocobalamin  1,000 mcg Intramuscular Daily  . heparin  5,000 Units Subcutaneous Q8H  . sodium chloride flush  3 mL Intravenous Once   . dextrose 5 % and 0.45% NaCl 125 mL/hr at 04/23/19 0704  . thiamine injection 250 mg (04/22/19 1639)   [id number], metoprolol tartrate  Pertinent Labs/Diagnostics:  EEG is pending.  Ammonia: WNL TSH: 4.536 ( slightly elevated) B12: WNL   EEG  Result Date: 04/22/2019 Lora Havens, MD     04/22/2019 10:55 AM Patient Name: Lance Ramirez MRN: [id number] Epilepsy Attending: Lora Havens  Referring Physician/Provider: Dr. Dana Allan Date: 04/22/2019 Duration: 24.02 minutes Patient history: He has-year-old male with history of prior strokes with no residual deficits who presented with difficulty walking and slurred speech.  MRI brain negative for stroke.  EEG to evaluate for seizures. Level of alertness: Awake, asleep AEDs during EEG study: None Technical aspects: This EEG study was done with scalp electrodes positioned according to the 10-20 International system of electrode placement. Electrical activity was acquired at a sampling rate of 500Hz  and reviewed with a high frequency filter of 70Hz  and a low frequency filter of 1Hz . EEG data were recorded continuously and digitally stored. Description: The posterior dominant rhythm consists of 8 Hz activity of moderate voltage (25-35 uV) seen predominantly in posterior head regions, symmetric and reactive to eye opening and eye closing.  EEG showed intermittent generalized 3-5 Hz theta-delta slowing.  Sleep was characterized by vertex waves, maximal frontocentral. Hyperventilation and photic stimulation were not performed. Abnormality - Intermittent slow, generalized IMPRESSION: This study is suggestive of mild diffuse encephalopathy, nonspecific to etiology.  No seizures or epileptiform discharges were seen throughout the recording. Lora Havens   CT Code Stroke CTA Head W/WO contrast  Result Date: 04/21/2019 CLINICAL DATA:  Slurred speech and unsteady gait. EXAM: CT ANGIOGRAPHY HEAD AND NECK CT PERFUSION BRAIN TECHNIQUE: Multidetector CT imaging of the head and neck was performed using the standard protocol during bolus administration of intravenous contrast. Multiplanar CT image reconstructions and MIPs were obtained to evaluate the vascular anatomy. Carotid stenosis measurements (  when applicable) are obtained utilizing NASCET criteria, using the distal internal carotid diameter as the denominator. Multiphase CT imaging of the brain was  performed following IV bolus contrast injection. Subsequent parametric perfusion maps were calculated using RAPID software. CONTRAST:  136mL OMNIPAQUE IOHEXOL 350 MG/ML SOLN COMPARISON:  Head CT earlier same day. FINDINGS: CTA NECK FINDINGS Aortic arch: Extensive aortic atherosclerosis. No aneurysm or dissection. Branching pattern is normal without flow limiting origin stenosis. Moderate stenosis of the proximal left subclavian artery. Right carotid system: Common carotid artery is widely patent to the bifurcation. Calcified plaque at the ICA bulb but no stenosis. Cervical ICA is tortuous but widely patent. Left carotid system: Common carotid artery widely patent to the bifurcation. Calcified plaque at the carotid bifurcation but no stenosis. Cervical ICA is tortuous but widely patent. Vertebral arteries: Right vertebral artery origin is widely patent. Calcified plaque at the left vertebral artery origin but with stenosis of no more than 50%. The left vertebral artery shows flow through the cervical region to the foramen magnum. The right vertebral artery shows proximal flow but slow and diminished flow distally consistent with distal vertebral artery thrombosis. See below. Skeleton: Ankylosis of the cervical spine.  Previous ACDF C3-4. Other neck: No soft tissue mass or lymphadenopathy. Upper chest: Left pleural effusion layering dependently. Otherwise negative. Review of the MIP images confirms the above findings CTA HEAD FINDINGS Anterior circulation: Both internal carotid arteries are patent through the skull base and siphon regions. There is ordinary siphon atherosclerotic calcification with stenosis estimated at 50-70% on both sides, particularly in the supraclinoid internal carotid arteries. On the right, there is focal calcification at the ICA bifurcation but without stenosis greater than 50%. No large or medium vessel occlusion is identified. Distal intracranial vessels show atherosclerotic narrowing and  irregularity. On the left, there is a 50-75% stenosis of the M1 segment. Posterior circulation: As noted below, the right vertebral artery is probably acutely occluded distally. The left vertebral artery shows irregular calcified plaque in the V4 segment, possibly flow limiting. Flow from the left vertebral artery does reach the basilar. There is narrowing and irregularity of the proximal basilar artery, with stenosis greater than 75%. Beyond that, the basilar shows moderate narrowing and irregularity. Superior cerebellar arteries show flow. Very diminutive left PCA shows a small amount of flow. Distal left PCA branches not well seen. Venous sinuses: Patent and normal. Anatomic variants: None significant. Review of the MIP images confirms the above findings CT Brain Perfusion Findings: ASPECTS: 10 CBF (<30%) Volume: 77mL Perfusion (Tmax>6.0s) volume: 95mL Mismatch Volume: 69mL Infarction Location:No discernible infarction. Possible T-max prolongation in the left occipital lobe which could be consistent with the left PCA disease described above. Small focus also seen at the frontal brain and right temporal lobe region probably artifactual. IMPRESSION: Aortic atherosclerosis. 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 No significant carotid bifurcation disease. Atherosclerotic change in the carotid siphon regions with stenosis estimated at 50-70% on both sides. Severe and diffuse intracranial anterior circulation atherosclerotic disease. Vessels are diffusely narrowed. Focal 50% stenosis of the left M1 segment. Right A1 segment severely diseased. Right vertebral artery is probably acutely occluded distally. Left vertebral artery shows a 50% stenosis at its origin. There is severe atherosclerotic disease in the V4 segment with severe stenosis. Flow does reach the basilar. 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 Atherosclerotic change more diffusely affecting the basilar artery.  Diminished flow in the PCA regions, particularly on the left. Very diminutive  left PCA with minimal flow. Perfusion imaging does not show any definite completed infarction. Question of prolonged T-max in the left occipital lobe which could go along with the left PCA disease. Case discussed with Dr. Rory Percy during interpretation. Electronically Signed   By: Nelson Chimes M.D.   On: 04/21/2019 21:56   CT Code Stroke CTA Neck W/WO contrast  Result Date: 04/21/2019 CLINICAL DATA:  Slurred speech and unsteady gait. EXAM: CT ANGIOGRAPHY HEAD AND NECK CT PERFUSION BRAIN TECHNIQUE: Multidetector CT imaging of the head and neck was performed using the standard protocol during bolus administration of intravenous contrast. Multiplanar CT image reconstructions and MIPs were obtained to evaluate the vascular anatomy. Carotid stenosis measurements (when applicable) are obtained utilizing NASCET criteria, using the distal internal carotid diameter as the denominator. Multiphase CT imaging of the brain was performed following IV bolus contrast injection. Subsequent parametric perfusion maps were calculated using RAPID software. CONTRAST:  133mL OMNIPAQUE IOHEXOL 350 MG/ML SOLN COMPARISON:  Head CT earlier same day. FINDINGS: CTA NECK FINDINGS Aortic arch: Extensive aortic atherosclerosis. No aneurysm or dissection. Branching pattern is normal without flow limiting origin stenosis. Moderate stenosis of the proximal left subclavian artery. Right carotid system: Common carotid artery is widely patent to the bifurcation. Calcified plaque at the ICA bulb but no stenosis. Cervical ICA is tortuous but widely patent. Left carotid system: Common carotid artery widely patent to the bifurcation. Calcified plaque at the carotid bifurcation but no stenosis. Cervical ICA is tortuous but widely patent. Vertebral arteries: Right vertebral artery origin is widely patent. Calcified plaque at the left vertebral artery origin but with stenosis of no  more than 50%. The left vertebral artery shows flow through the cervical region to the foramen magnum. The right vertebral artery shows proximal flow but slow and diminished flow distally consistent with distal vertebral artery thrombosis. See below. Skeleton: Ankylosis of the cervical spine.  Previous ACDF C3-4. Other neck: No soft tissue mass or lymphadenopathy. Upper chest: Left pleural effusion layering dependently. Otherwise negative. Review of the MIP images confirms the above findings CTA HEAD FINDINGS Anterior circulation: Both internal carotid arteries are patent through the skull base and siphon regions. There is ordinary siphon atherosclerotic calcification with stenosis estimated at 50-70% on both sides, particularly in the supraclinoid internal carotid arteries. On the right, there is focal calcification at the ICA bifurcation but without stenosis greater than 50%. No large or medium vessel occlusion is identified. Distal intracranial vessels show atherosclerotic narrowing and irregularity. On the left, there is a 50-75% stenosis of the M1 segment. Posterior circulation: As noted below, the right vertebral artery is probably acutely occluded distally. The left vertebral artery shows irregular calcified plaque in the V4 segment, possibly flow limiting. Flow from the left vertebral artery does reach the basilar. There is narrowing and irregularity of the proximal basilar artery, with stenosis greater than 75%. Beyond that, the basilar shows moderate narrowing and irregularity. Superior cerebellar arteries show flow. Very diminutive left PCA shows a small amount of flow. Distal left PCA branches not well seen. Venous sinuses: Patent and normal. Anatomic variants: None significant. Review of the MIP images confirms the above findings CT Brain Perfusion Findings: ASPECTS: 10 CBF (<30%) Volume: 85mL Perfusion (Tmax>6.0s) volume: 36mL Mismatch Volume: 52mL Infarction Location:No discernible infarction. Possible  T-max prolongation in the left occipital lobe which could be consistent with the left PCA disease described above. Small focus also seen at the frontal brain and right temporal lobe region probably artifactual. IMPRESSION: Aortic  atherosclerosis. 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 No significant carotid bifurcation disease. Atherosclerotic change in the carotid siphon regions with stenosis estimated at 50-70% on both sides. Severe and diffuse intracranial anterior circulation atherosclerotic disease. Vessels are diffusely narrowed. Focal 50% stenosis of the left M1 segment. Right A1 segment severely diseased. Right vertebral artery is probably acutely occluded distally. Left vertebral artery shows a 50% stenosis at its origin. There is severe atherosclerotic disease in the V4 segment with severe stenosis. Flow does reach the basilar. 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 Atherosclerotic change more diffusely affecting the basilar artery. Diminished flow in the PCA regions, particularly on the left. Very diminutive left PCA with minimal flow. Perfusion imaging does not show any definite completed infarction. Question of prolonged T-max in the left occipital lobe which could go along with the left PCA disease. Case discussed with Dr. Rory Percy during interpretation. Electronically Signed   By: Nelson Chimes M.D.   On: 04/21/2019 21:56   MR BRAIN WO CONTRAST  Result Date: 04/21/2019 CLINICAL DATA:  Encephalopathy. Gait disturbance. Speech disturbance. Intermittent consciousness. EXAM: MRI HEAD WITHOUT CONTRAST TECHNIQUE: Multiplanar, multiecho pulse sequences of the brain and surrounding structures were obtained without intravenous contrast. COMPARISON:  CT studies same day FINDINGS: Brain: Diffusion imaging does not show any acute or subacute infarction at this time. There are a few old small vessel cerebellar infarctions on the right. Minimal small vessel change affects  the pons. Cerebral hemispheres show old lacunar infarctions in the thalami and in the right basal ganglia. There chronic small-vessel ischemic changes affecting the cerebral hemispheric deep and subcortical white matter. There is an old right occipital cortical and subcortical infarction. No mass lesion, hydrocephalus or extra-axial collection. There are a few small foci of hemosiderin deposition associated with some of the old small vessel infarctions. Vascular: Abnormal flow in the right vertebral artery as shown on CT. Other major vessels show flow. Skull and upper cervical spine: Negative Sinuses/Orbits: Clear/normal Other: None IMPRESSION: No acute infarction evident yet by MRI. Extensive chronic small-vessel ischemic changes throughout the brain as outlined above. Old right occipital cortical and subcortical infarction. Abnormal flow in the right vertebral artery. Electronically Signed   By: Nelson Chimes M.D.   On: 04/21/2019 22:39   CT Code Stroke Cerebral Perfusion with contrast  Result Date: 04/21/2019 CLINICAL DATA:  Slurred speech and unsteady gait. EXAM: CT ANGIOGRAPHY HEAD AND NECK CT PERFUSION BRAIN TECHNIQUE: Multidetector CT imaging of the head and neck was performed using the standard protocol during bolus administration of intravenous contrast. Multiplanar CT image reconstructions and MIPs were obtained to evaluate the vascular anatomy. Carotid stenosis measurements (when applicable) are obtained utilizing NASCET criteria, using the distal internal carotid diameter as the denominator. Multiphase CT imaging of the brain was performed following IV bolus contrast injection. Subsequent parametric perfusion maps were calculated using RAPID software. CONTRAST:  153mL OMNIPAQUE IOHEXOL 350 MG/ML SOLN COMPARISON:  Head CT earlier same day. FINDINGS: CTA NECK FINDINGS Aortic arch: Extensive aortic atherosclerosis. No aneurysm or dissection. Branching pattern is normal without flow limiting origin  stenosis. Moderate stenosis of the proximal left subclavian artery. Right carotid system: Common carotid artery is widely patent to the bifurcation. Calcified plaque at the ICA bulb but no stenosis. Cervical ICA is tortuous but widely patent. Left carotid system: Common carotid artery widely patent to the bifurcation. Calcified plaque at the carotid bifurcation but no stenosis. Cervical ICA is tortuous but widely patent. Vertebral arteries:  Right vertebral artery origin is widely patent. Calcified plaque at the left vertebral artery origin but with stenosis of no more than 50%. The left vertebral artery shows flow through the cervical region to the foramen magnum. The right vertebral artery shows proximal flow but slow and diminished flow distally consistent with distal vertebral artery thrombosis. See below. Skeleton: Ankylosis of the cervical spine.  Previous ACDF C3-4. Other neck: No soft tissue mass or lymphadenopathy. Upper chest: Left pleural effusion layering dependently. Otherwise negative. Review of the MIP images confirms the above findings CTA HEAD FINDINGS Anterior circulation: Both internal carotid arteries are patent through the skull base and siphon regions. There is ordinary siphon atherosclerotic calcification with stenosis estimated at 50-70% on both sides, particularly in the supraclinoid internal carotid arteries. On the right, there is focal calcification at the ICA bifurcation but without stenosis greater than 50%. No large or medium vessel occlusion is identified. Distal intracranial vessels show atherosclerotic narrowing and irregularity. On the left, there is a 50-75% stenosis of the M1 segment. Posterior circulation: As noted below, the right vertebral artery is probably acutely occluded distally. The left vertebral artery shows irregular calcified plaque in the V4 segment, possibly flow limiting. Flow from the left vertebral artery does reach the basilar. There is narrowing and  irregularity of the proximal basilar artery, with stenosis greater than 75%. Beyond that, the basilar shows moderate narrowing and irregularity. Superior cerebellar arteries show flow. Very diminutive left PCA shows a small amount of flow. Distal left PCA branches not well seen. Venous sinuses: Patent and normal. Anatomic variants: None significant. Review of the MIP images confirms the above findings CT Brain Perfusion Findings: ASPECTS: 10 CBF (<30%) Volume: 47mL Perfusion (Tmax>6.0s) volume: 94mL Mismatch Volume: 27mL Infarction Location:No discernible infarction. Possible T-max prolongation in the left occipital lobe which could be consistent with the left PCA disease described above. Small focus also seen at the frontal brain and right temporal lobe region probably artifactual. IMPRESSION: Aortic atherosclerosis. 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 No significant carotid bifurcation disease. Atherosclerotic change in the carotid siphon regions with stenosis estimated at 50-70% on both sides. Severe and diffuse intracranial anterior circulation atherosclerotic disease. Vessels are diffusely narrowed. Focal 50% stenosis of the left M1 segment. Right A1 segment severely diseased. Right vertebral artery is probably acutely occluded distally. Left vertebral artery shows a 50% stenosis at its origin. There is severe atherosclerotic disease in the V4 segment with severe stenosis. Flow does reach the basilar. 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 Atherosclerotic change more diffusely affecting the basilar artery. Diminished flow in the PCA regions, particularly on the left. Very diminutive left PCA with minimal flow. Perfusion imaging does not show any definite completed infarction. Question of prolonged T-max in the left occipital lobe which could go along with the left PCA disease. Case discussed with Dr. Rory Percy during interpretation. Electronically Signed   By: Nelson Chimes  M.D.   On: 04/21/2019 21:56   DG Chest Portable 1 View  Result Date: 04/21/2019 CLINICAL DATA:  Short of breath, unresponsive EXAM: PORTABLE CHEST 1 VIEW COMPARISON:  05/09/2018 FINDINGS: Single frontal view of the chest demonstrates a stable cardiac silhouette. Chronic small left pleural effusion and left basilar consolidation. No pneumothorax. Right chest is clear. IMPRESSION: 1. Chronic small left pleural effusion and left basilar atelectasis. 2. Otherwise no acute process. Electronically Signed   By: Randa Ngo M.D.   On: 04/21/2019 22:03   CT HEAD CODE  STROKE WO CONTRAST  Result Date: 04/21/2019 CLINICAL DATA:  Code stroke. Slurred speech and unsteady gait. Last seen normal 1930 hours EXAM: CT HEAD WITHOUT CONTRAST TECHNIQUE: Contiguous axial images were obtained from the base of the skull through the vertex without intravenous contrast. COMPARISON:  07/16/2017 head CT FINDINGS: Brain: There is no mass, hemorrhage or extra-axial collection. There is generalized atrophy without lobar predilection. There is hypoattenuation of the periventricular white matter, most commonly indicating chronic ischemic microangiopathy. There is an old left thalamus small vessel infarct. Vascular: No abnormal hyperdensity of the major intracranial arteries or dural venous sinuses. No intracranial atherosclerosis. Skull: The visualized skull base, calvarium and extracranial soft tissues are normal. Sinuses/Orbits: No fluid levels or advanced mucosal thickening of the visualized paranasal sinuses. No mastoid or middle ear effusion. The orbits are normal. ASPECTS [facility] Stroke Program Early CT Score) - Ganglionic level infarction (caudate, lentiform nuclei, internal capsule, insula, M1-M3 cortex): 7 - Supraganglionic infarction (M4-M6 cortex): 3 Total score (0-10 with 10 being normal): 10 IMPRESSION: 1. No acute finding. 2. Chronic ischemic microangiopathy and generalized volume loss. Old left thalamic small vessel  infarct. 3. ASPECTS is 10. These results were communicated to Dr. Amie Portland at 9:24 pm on 04/21/2019 by text page via the [facility] messaging system. Electronically Signed   By: Ulyses Jarred M.D.   On: 04/21/2019 21:25   Assessment:  86 year old man with past medical history of dementia, prior strokes with no residual deficits, hypertension hyperlipidemia brought in for difficulty walking and slurred speech. 1)CTA did not show Basilar occlusion but was concerning for possible right vertebral artery occlusion. 2) mri of brain was negative for stroke 3) EEG was negative for seizures. 4) dementia/ cognitive decline is a concern and will recommend outpatient neurology f/u for evaluation. 5) b12 was low end of normal - started supplement. .   Impression: Encephalopathy-etiology under investigation-multifactorial including toxic metabolic causes. 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 -- continue b12 supplementation  -Outpatient follow-up for  dementia/sleep apnea   Laurey Morale, MSN, NP-C [facility] [phone]  Attending neurologist's note to follow  04/23/2019, 12:11 PM  NEUROHOSPITALIST ADDENDUM Performed a face to face diagnostic evaluation.   I have reviewed the contents of history and physical exam as documented by PA/ARNP/Resident and agree with above documentation.  I have discussed and formulated the above plan as documented. Edits to the note have been made as needed.   Patient is more alert today, speaking with son intermittently.  Son states that he has shown signs of dementia, has become worse since his wife died approximately a month ago.  Appreciate psych eval.  Continue  B12 supplementation-we will need outpatient neurology follow-up    Trevan Messman MD [facility] [id number]   If 7pm to 7am, please call on call as listed on AMION.
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
## 2019-04-23 NOTE — Evaluation (Signed)
Physical Therapy Evaluation Patient Details Name: Lance Ramirez MRN: [id number] DOB: 28-Jan-1934 Today's Date: 04/23/2019   History of Present Illness  86 yo admitted with confusion, garbled speech and metabolic encephalopathy. Wife recently passed away. PMhx: dementia, CVA, CKD, CHF, HTN, BPH, HLD  Clinical Impression  Pt with eyes closed mumbling on arrival, arousable to name and able to follow multimodal cues with time for basic transfers. Pt impulsive with movement eOB standing quickly despite cues and able to state need to urinate with assist for urinal. Pt with decreased cognition, safety and function who will benefit from acute therapy to maximize mobility and safety. Talked to daughter Anderson Malta via phone who states his spouse of 62years passed in Jan and cognitive changes leading to admission were very sudden. Pt normally walks without assist and has help of caregivers during the day and talks to daughter regularly on the phone. Will continue to follow to allow safe return home with assist.   94% on 2L, HR 130 with activity   Follow Up Recommendations Home health PT;Supervision/Assistance - 24 hour    Equipment Recommendations  Rolling walker with 5" wheels;3in1 (PT)    Recommendations for Other Services OT consult     Precautions / Restrictions Precautions Precautions: Fall      Mobility  Bed Mobility Overal bed mobility: Needs Assistance Bed Mobility: Supine to Sit;Sit to Supine     Supine to sit: Min guard Sit to supine: Min assist;HOB elevated   General bed mobility comments: pt able to transition from supine to sitting with use of rail, increased time and multimodal cues to initiate and complete task. Return to bed with min assist to clear legs onto surface. Pt would not follow commands for further mobility in bed  Transfers Overall transfer level: Needs assistance   Transfers: Sit to/from Stand Sit to Stand: Min guard         General transfer comment: pt  impulsively standing from EOB despite cues and direction to wait due to lines.  Ambulation/Gait Ambulation/Gait assistance: Min assist Gait Distance (Feet): 3 Feet   Gait Pattern/deviations: Step-through pattern;Decreased stride length     General Gait Details: pt impulsive with gait reaching for all environmental supports and required assist for balance and multimodal cues for safety and to stop with pt requiring assist for urinal in standing prior to return 3' to bed. Did not attempt further mobility given impulsivity and decreased safety.  Stairs            Wheelchair Mobility    Modified Rankin (Stroke Patients Only)       Balance Overall balance assessment: Needs assistance   Sitting balance-Leahy Scale: Fair       Standing balance-Leahy Scale: Fair                               Pertinent Vitals/Pain Pain Assessment: No/denies pain Faces Pain Scale: Hurts little more    Home Living Family/patient expects to be discharged to:: Private residence Living Arrangements: Alone Available Help at Discharge: Personal care attendant Type of Home: House Home Access: Stairs to enter   [employer] of Steps: 1 4" step Home Layout: One level Home Equipment: Walker - 2 wheels Additional Comments: pt unable to state home environment or PLOF and daughter provided via phone    Prior Function Level of Independence: Needs assistance   Gait / Transfers Assistance Needed: does not use AD  ADL's / [employer]  Needed: caregiver assisting with all ADLs and performing all iADLs  Comments: aide M-F from 9a-8p, Sat-Sun all day. daughter has cameras in the townhouse to check on him, call regularly and states 24hr assist can be arranged     Hand Dominance        Extremity/Trunk Assessment   Upper Extremity Assessment Upper Extremity Assessment: Generalized weakness    Lower Extremity Assessment Lower Extremity Assessment: Generalized  weakness    Cervical / Trunk Assessment Cervical / Trunk Assessment: Normal  Communication      Cognition Arousal/Alertness: Lethargic Behavior During Therapy: Impulsive Overall Cognitive Status: No family/caregiver present to determine baseline cognitive functioning                                 General Comments: pt non verbal throughout session other than brief statement "i'm gonna pee" unable to provide home setup and increased time and delay following commands      General Comments      Exercises     Assessment/Plan    PT Assessment Patient needs continued PT services  PT Problem List Decreased mobility;Decreased activity tolerance;Decreased balance;Decreased knowledge of use of DME;Decreased cognition       PT Treatment Interventions Gait training;DME instruction;Therapeutic exercise;Balance training;Functional mobility training;Therapeutic activities;Patient/family education;Cognitive remediation    PT Goals (Current goals can be found in the Care Plan section)  Acute Rehab PT Goals Patient Stated Goal: be able to return home PT Goal Formulation: With family Time For Goal Achievement: 05/07/19 Potential to Achieve Goals: Fair    Frequency Min 3X/week   Barriers to discharge        Co-evaluation               AM-PAC PT "6 Clicks" Mobility  Outcome Measure Help needed turning from your back to your side while in a flat bed without using bedrails?: A Little Help needed moving from lying on your back to sitting on the side of a flat bed without using bedrails?: A Little Help needed moving to and from a bed to a chair (including a wheelchair)?: A Lot Help needed standing up from a chair using your arms (e.g., wheelchair or bedside chair)?: A Little Help needed to walk in hospital room?: A Lot Help needed climbing 3-5 steps with a railing? : A Lot 6 Click Score: 15    End of Session   Activity Tolerance: Patient tolerated treatment  well Patient left: in bed;with call bell/phone within reach;with bed alarm set Nurse Communication: Mobility status PT Visit Diagnosis: Other abnormalities of gait and mobility (R26.89);Difficulty in walking, not elsewhere classified (R26.2)    Time: [id number] PT Time Calculation (min) (ACUTE ONLY): 10 min   Charges:   PT Evaluation $PT Eval Moderate Complexity: 1 Mod          Maika Kaczmarek P, PT Acute Rehabilitation Services Pager: [phone] Office: [phone]   Maybel Dambrosio B Branae Crail 04/23/2019, 1:47 PM
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
## 2019-04-23 NOTE — Progress Notes (Signed)
Occupational Therapy Evaluation Patient Details Name: Lance Ramirez MRN: [id number] DOB: 06/22/1933 Today's Date: 04/23/2019    History of Present Illness 86 yo admitted with confusion, garbled speech and metabolic encephalopathy. Wife recently passed away. PMhx: dementia, CVA, CKD, CHF, HTN, BPH, HLD   Clinical Impression   PTA, pt was living at home with pca present during the day, family has cameras in pt's home to observe as well. Pt's son present during session and reports pt is able to complete ADL with supervision to minA from his pca and walks without any assistance. Pt currently keeps eyes shut majority of the session and mumbling. He is highly distracted with need to urinate and impulsively attempted to stand from the bed. Pt required minA to standing and +2 assistance to redirect due to safety. Pt's son reports his dad appears very different from baseline in regards to cognition and physical abilities. Due to decline in current level of function, pt would benefit from acute OT to address established goals to facilitate safe D/C to venue listed below. At this time, recommend HHOT follow-up. Will continue to follow acutely.     Follow Up Recommendations  Home health OT;Supervision/Assistance - 24 hour    Equipment Recommendations  None recommended by OT    Recommendations for Other Services       Precautions / Restrictions Precautions Precautions: Fall Restrictions Weight Bearing Restrictions: No      Mobility Bed Mobility Overal bed mobility: Needs Assistance Bed Mobility: Supine to Sit;Sit to Supine     Supine to sit: Min assist Sit to supine: Min assist;HOB elevated   General bed mobility comments: minA to progress trunk to upright posture;  Transfers Overall transfer level: Needs assistance   Transfers: Sit to/from Stand Sit to Stand: Min assist         General transfer comment: minA for stability in standing with additional support from pt's son due to  pt's increased impulsivity and attempting to ambulate to the bathroom despite max direction to wait due to lines    Balance Overall balance assessment: Needs assistance   Sitting balance-Leahy Scale: Fair     Standing balance support: No upper extremity supported;During functional activity Standing balance-Leahy Scale: Fair Standing balance comment: reliant on UE support in standing                           ADL either performed or assessed with clinical judgement   ADL Overall ADL's : Needs assistance/impaired Eating/Feeding: Set up;Sitting   Grooming: Minimal assistance;Standing   Upper Body Bathing: Min guard;Sitting   Lower Body Bathing: Minimal assistance;Sit to/from stand   Upper Body Dressing : Minimal assistance;Sitting   Lower Body Dressing: Moderate assistance;Sit to/from stand   Toilet Transfer: Minimal assistance Toilet Transfer Details (indicate cue type and reason): stood at EOB to attempt to urinate in urinal Toileting- Clothing Manipulation and Hygiene: Minimal assistance;Sit to/from stand       Functional mobility during ADLs: Minimal assistance;+2 for safety/equipment General ADL Comments: +2 for safety with assist from pt's son;pt is impulsive and unsteady in standing     Vision   Additional Comments: difficult to assess, pt distracted by need to urinate and kept eyes shut majority of the session     Perception     Praxis      Pertinent Vitals/Pain Pain Assessment: Faces Faces Pain Scale: Hurts even more Pain Location: grimacing and holding bladder Pain Descriptors / Indicators: Grimacing;Guarding Pain Intervention(s):  Limited activity within patient's tolerance;Monitored during session     Hand Dominance Right   Extremity/Trunk Assessment Upper Extremity Assessment Upper Extremity Assessment: Generalized weakness   Lower Extremity Assessment Lower Extremity Assessment: Generalized weakness   Cervical / Trunk  Assessment Cervical / Trunk Assessment: Normal   Communication Communication Communication: No difficulties   Cognition Arousal/Alertness: Lethargic Behavior During Therapy: Impulsive Overall Cognitive Status: No family/caregiver present to determine baseline cognitive functioning                                 General Comments: pt non verbal throughout session other than brief statement "i'm gonna pee" unable to provide home setup and increased time and delay following commands   General Comments  vss    Exercises     Shoulder Instructions      Home Living Family/patient expects to be discharged to:: Private residence Living Arrangements: Alone Available Help at Discharge: Personal care attendant Type of Home: House Home Access: Stairs to enter [employer] of Steps: 1 4" step Entrance Stairs-Rails: Right Home Layout: One level     Bathroom Shower/Tub: Occupational psychologist: Handicapped height     Home Equipment: Environmental consultant - 2 wheels   Additional Comments: pt unable to state home environment or PLOF and daughter provided via phone      Prior Functioning/Environment Level of Independence: Needs assistance  Gait / Transfers Assistance Needed: does not use AD ADL's / Homemaking Assistance Needed: caregiver assisting with all ADLs and performing all iADLs   Comments: aide M-F from 9a-8p, Sat-Sun all day. daughter has cameras in the townhouse to check on him, call regularly and states 24hr assist can be arranged        OT Problem List: Decreased strength;Decreased range of motion;Decreased activity tolerance;Impaired balance (sitting and/or standing);Decreased cognition;Impaired vision/perception;Decreased safety awareness;Decreased knowledge of use of DME or AE;Pain      OT Treatment/Interventions: Self-care/ADL training;Therapeutic exercise;DME and/or AE instruction;Therapeutic activities;Cognitive  remediation/compensation;Patient/family education;Balance training    OT Goals(Current goals can be found in the care plan section) Acute Rehab OT Goals Patient Stated Goal: to pee OT Goal Formulation: With patient Time For Goal Achievement: 05/07/19 Potential to Achieve Goals: Good ADL Goals Pt Will Perform Grooming: with supervision;standing Pt Will Perform Upper Body Dressing: with supervision;standing Pt Will Perform Lower Body Dressing: with supervision;sit to/from stand Pt Will Transfer to Toilet: with supervision;ambulating  OT Frequency: Min 2X/week   Barriers to D/C:            Co-evaluation              AM-PAC OT "6 Clicks" Daily Activity     Outcome Measure Help from another person eating meals?: A Little Help from another person taking care of personal grooming?: A Little Help from another person toileting, which includes using toliet, bedpan, or urinal?: A Little Help from another person bathing (including washing, rinsing, drying)?: A Little Help from another person to put on and taking off regular upper body clothing?: A Little Help from another person to put on and taking off regular lower body clothing?: A Little 6 Click Score: 18   End of Session Equipment Utilized During Treatment: Gait belt;Rolling walker Nurse Communication: Mobility status  Activity Tolerance: Patient tolerated treatment well;Patient limited by pain Patient left: in bed;with call bell/phone within reach;with bed alarm set;with family/visitor present  OT Visit Diagnosis: Unsteadiness on feet (R26.81);Other abnormalities of  gait and mobility (R26.89);Muscle weakness (generalized) (M62.81);Other symptoms and signs involving cognitive function;Pain Pain - part of body: (bladder)                Time: 1420-1435 OT Time Calculation (min): 15 min Charges:  OT General Charges $OT Visit: 1 Visit OT Evaluation $OT Eval Moderate Complexity: [place] OTR/L Acute  Rehabilitation Services Office: [place] 04/23/2019, 3:25 PM

## 2019-04-23 NOTE — Progress Notes (Signed)
PROGRESS NOTE  Lance Ramirez [id number] DOB: [birth year]   PCP: Unk Pinto, MD  Patient is from: home.  Has home care  DOA: 04/21/2019 LOS: 1  Brief Narrative / Interim history: 86 year old male with history of advanced dementia, CVA, CKD-4, diastolic CHF, HTN, BPH and HLD brought to ED with acute confusion, garbled speech and difficulty following command the evening of presentation to ED on 2/11.  Reportedly in his usual state of health up until that time although he has been staying up most of the night lately.  Wife passed away about a month ago.  Has been repeatedly saying " I am leaving to be with your mother" to his daughter Lately.  He has a Emergency planning/management officer who administers his medications.  Daughter doubts about overdose.   In ED, hemodynamically stable.  CMP, CBC ABG and UA without acute significant finding. Troponin and EKG not impressive., lactic acid, B12, ammonia and UDS normal. CT head without acute finding.  COVID-19 negative.  TSH not impressive.  Cortisol 4.1 (slightly low).  CTA head and neck with severe proximal basilar artery stenosis, acute right vertebral artery occlusion and 50 to 70% bilateral carotid artery stenosis.  Neurology consulted and admitted for acute encephalopathy.  EEG with encephalopathy but no seizure activity.  Subjective: No major events overnight or this morning.  Intermittently tachycardic to 130s and tachypneic to 20s overnight and this morning.  HR in the range of 100-110 after IV metoprolol.  He is more awake but confused and trying to get out of the bed.  Follows commands better.  Objective: Vitals:   04/23/19 0433 04/23/19 0751 04/23/19 0816 04/23/19 0918  BP: 121/70 (!) 169/80 (!) 149/93   Pulse: (!) 129 (!) 129    Resp: (!) 22 (!) 28 (!) 26 (!) 22  Temp: (!) 97.4 F (36.3 C) 98.3 F (36.8 C) 98 F (36.7 C)   TempSrc: Oral Oral Oral   SpO2: 96% 96% 98% 95%    Intake/Output Summary (Last 24 hours) at 04/23/2019 1038 Last  data filed at 04/23/2019 1001 Gross per 24 hour  Intake 1892.1 ml  Output 900 ml  Net 992.1 ml   There were no vitals filed for this visit.  Examination:  GENERAL: No apparent distress.  Confused. HEENT: Dry mucous membrane.  Vision and hearing grossly intact.  NECK: Supple.  No apparent JVD.  RESP:  No IWOB.  Fair aeration bilaterally. CVS: HR 100-110.  Regular rhythm. Heart sounds normal.  ABD/GI/GU: Bowel sounds present. Soft. Non tender.  MSK/EXT:  Moves extremities. No apparent deformity. No edema.  SKIN: no apparent skin lesion or wound NEURO: Awake but confused.  Follows commands.  No facial asymmetry.  PERRL.  Motor 5/5 in all extremities.  Patellar reflex symmetric. PSYCH: Confused and restless.  Trying to get out of the bed.  Procedures:  None  Assessment & Plan: Acute metabolic encephalopathy in patient with advanced dementia: unclear despite extensive work-up including MRI brain, EEG, UDS, ammonia, B12, cortisol, TSH and urine culture.  CTA head and neck as below.  Concern about delirium.  Catatonia?  Lost his wife about a month ago.   -Neurology following. -Psychiatry consulted 2/12. -Follow RPR, thiamine level and blood cultures. -Continue high-dose thiamine 2/12> -Fall, aspiration and delirium precautions. -N.p.o. until able to swallow safely. -Continue D5-1/2NS at 125 cc an hour until he takes p.o. safely. -PT/OT/SLP eval when able to  Vertebrobasilar insufficiency Bilateral carotid artery stenosis -CTA 75% proximal basilar a. stenosis, "acute" right VA  occlusion and 50 to 70% bilateral CA stenosis -MRI brain without acute finding. -Neurology recommended SBP above 140 -IV fluid as above  History of CVA: MRI negative for CVA.  CT findings as above. -Resume Plavix and aspirin when able to take p.o.  Chronic diastolic CHF: Echo in [id number] with EF of 60 to 65% and G1 DD, moderate LAE, PAPP 40. BNP 830 (529 in 2019).  No respiratory distress.  No pulmonary  congestion on CXR.  Not on diuretics as above. -IV fluid as above -Closely monitor fluid status  -Follow echocardiogram  CKD-4: Cr better than baseline. -Continue monitoring  Essential hypertension/tachycardia: On multiple antihypertensive medications at home.  BP within fair range. -IV metoprolol 2.5 mg every 6 hours -IV fluid as above  Tachypnea: Lung exam reassuring. -Portable CXR and BNP  Mood disorder -Hold home medications while NPO. -Psych consulted 2/12.  BPH: On Proscar and Cardura at home. -Monitor urine output -Bladder scan as needed.  Thrombocytopenia: Platelet 104-stable. -Continue monitoring                 DVT prophylaxis: Subcu heparin Code Status: DNR/DNI Family Communication: Updated patient's daughter over the phone on 2/12.  No answer or VM on 2/13.  Discharge barrier: Persistent encephalopathy Patient is from: Home Final disposition: To be determined based on clinical progress.  Consultants: Neurology, psychiatry   Microbiology summarized: COVID-19 negative. Influenza PCR negative. Urine culture pending.  Sch Meds:  Scheduled Meds: . cyanocobalamin  1,000 mcg Intramuscular Daily  . heparin  5,000 Units Subcutaneous Q8H  . sodium chloride flush  3 mL Intravenous Once   Continuous Infusions: . dextrose 5 % and 0.45% NaCl 125 mL/hr at 04/23/19 0704  . thiamine injection 250 mg (04/22/19 1639)   PRN Meds:.ipratropium-albuterol, metoprolol tartrate  Antimicrobials: Anti-infectives (From admission, onward)   None       I have personally reviewed the following labs and images: CBC: Recent Labs  Lab 04/21/19 2101 04/21/19 2119 04/21/19 2159 04/22/19 0355 04/23/19 0245  WBC 4.7  --   --  4.6 6.5  NEUTROABS 2.6  --   --   --   --   HGB 13.5 13.6 12.6* 13.0 13.8  HCT 41.7 40.0 37.0* 41.5 42.8  MCV 90.1  --   --  92.4 90.9  PLT 130*  --   --  105* 104*   BMP &GFR Recent Labs  Lab 04/21/19 2101 04/21/19 2101  04/21/19 2119 04/21/19 2159 04/22/19 0130 04/22/19 0355 04/22/19 2251 04/23/19 0245  NA 138   < > 138 137  --  139 141 141  K 4.2   < > 4.3 4.1  --  5.1 4.5 4.3  CL 108  --  105  --   --  107 107 108  CO2 21*  --   --   --   --  25 25 24   GLUCOSE 122*  --  109*  --   --  120* 133* 134*  BUN 21  --  25*  --   --  21 20 21   CREATININE 1.78*   < > 1.80*  --  1.75* 1.79* 1.84* 1.86*  CALCIUM 8.5*  --   --   --   --  8.3* 8.7* 8.8*  MG  --   --   --   --  2.0  --   --  2.0  PHOS  --   --   --   --  4.9*  --   --  3.7   < > = values in this interval not displayed.   CrCl cannot be calculated (Unknown ideal weight.). Liver & Pancreas: Recent Labs  Lab 04/21/19 2101 04/22/19 2251 04/23/19 0245  AST 10* 16  --   ALT 11 13  --   ALKPHOS 39 43  --   BILITOT 0.6 0.8  --   PROT 5.0* 5.3*  --   ALBUMIN 2.9* 3.0* 2.8*   No results for input(s): LIPASE, AMYLASE in the last 168 hours. Recent Labs  Lab 04/21/19 2238 04/22/19 0043  AMMONIA 28 23   Diabetic: No results for input(s): HGBA1C in the last 72 hours. Recent Labs  Lab 04/21/19 2105 04/22/19 2114 04/23/19 0756  GLUCAP 105* 107* 128*   Cardiac Enzymes: No results for input(s): CKTOTAL, CKMB, CKMBINDEX, TROPONINI in the last 168 hours. No results for input(s): PROBNP in the last 8760 hours. Coagulation Profile: Recent Labs  Lab 04/21/19 2101  INR 1.1   Thyroid Function Tests: Recent Labs    04/21/19 2247  TSH 4.536*   Lipid Profile: No results for input(s): CHOL, HDL, LDLCALC, TRIG, CHOLHDL, LDLDIRECT in the last 72 hours. Anemia Panel: Recent Labs    04/21/19 2238  VITAMINB12 210   Urine analysis:    Component Value Date/Time   COLORURINE YELLOW 04/22/2019 0008   APPEARANCEUR CLEAR 04/22/2019 0008   LABSPEC 1.025 04/22/2019 0008   PHURINE 6.0 04/22/2019 0008   GLUCOSEU NEGATIVE 04/22/2019 0008   HGBUR SMALL (A) 04/22/2019 0008   BILIRUBINUR NEGATIVE 04/22/2019 0008   KETONESUR NEGATIVE 04/22/2019  0008   PROTEINUR NEGATIVE 04/22/2019 0008   UROBILINOGEN 0.2 08/09/2013 1557   NITRITE NEGATIVE 04/22/2019 0008   LEUKOCYTESUR SMALL (A) 04/22/2019 0008   Sepsis Labs: Invalid input(s): PROCALCITONIN, Circle  Microbiology: Recent Results (from the past 240 hour(s))  SARS CORONAVIRUS 2 (TAT 6-24 HRS) Nasopharyngeal Nasopharyngeal Swab     Status: None   Collection Time: 04/21/19 11:48 PM   Specimen: Nasopharyngeal Swab  Result Value Ref Range Status   SARS Coronavirus 2 NEGATIVE NEGATIVE Final    Comment: (NOTE) SARS-CoV-2 target nucleic acids are NOT DETECTED. The SARS-CoV-2 RNA is generally detectable in upper and lower respiratory specimens during the acute phase of infection. Negative results do not preclude SARS-CoV-2 infection, do not rule out co-infections with other pathogens, and should not be used as the sole basis for treatment or other patient management decisions. Negative results must be combined with clinical observations, patient history, and epidemiological information. The expected result is Negative. Fact Sheet for Patients: SugarRoll.be Fact Sheet for Healthcare Providers: https://www.woods-mathews.com/ This test is not yet approved or cleared by the [place] FDA and  has been authorized for detection and/or diagnosis of SARS-CoV-2 by FDA under an Emergency Use Authorization (EUA). This EUA will remain  in effect (meaning this test can be used) for the duration of the COVID-19 declaration under Section 56 4(b)(1) of the Act, 21 U.S.C. section 360bbb-3(b)(1), unless the authorization is terminated or revoked sooner. Performed at [facility], [place] [address] [place], [place] [address]   Culture, Urine     Status: None   Collection Time: 04/22/19 12:30 AM   Specimen: Urine, Random  Result Value Ref Range Status   Specimen Description URINE, RANDOM  Final   Special Requests NONE  Final   Culture    Final    NO GROWTH Performed at [facility], [place] [address] [place],  [address]  Report Status 04/22/2019 FINAL  Final    Radiology Studies: EEG  Result Date: 04/22/2019 Lora Havens, MD     04/22/2019 10:55 AM Patient Name: Heith Palos MRN: [id number] Epilepsy Attending: Lora Havens Referring Physician/Provider: Dr. Dana Allan Date: 04/22/2019 Duration: 24.02 minutes Patient history: He has-year-old male with history of prior strokes with no residual deficits who presented with difficulty walking and slurred speech.  MRI brain negative for stroke.  EEG to evaluate for seizures. Level of alertness: Awake, asleep AEDs during EEG study: None Technical aspects: This EEG study was done with scalp electrodes positioned according to the 10-20 International system of electrode placement. Electrical activity was acquired at a sampling rate of 500Hz  and reviewed with a high frequency filter of 70Hz  and a low frequency filter of 1Hz . EEG data were recorded continuously and digitally stored. Description: The posterior dominant rhythm consists of 8 Hz activity of moderate voltage (25-35 uV) seen predominantly in posterior head regions, symmetric and reactive to eye opening and eye closing.  EEG showed intermittent generalized 3-5 Hz theta-delta slowing.  Sleep was characterized by vertex waves, maximal frontocentral. Hyperventilation and photic stimulation were not performed. Abnormality - Intermittent slow, generalized IMPRESSION: This study is suggestive of mild diffuse encephalopathy, nonspecific to etiology.  No seizures or epileptiform discharges were seen throughout the recording. Priyanka Barbra Sarks     Tris Howell T. [place]  If 7PM-7AM, please contact night-coverage [email or web address] Password [facility] 04/23/2019, 10:38 AM

## 2019-04-23 NOTE — Progress Notes (Signed)
Pt states his throat hurts when he swallows and it burns when he urinates.

## 2019-04-23 NOTE — Progress Notes (Signed)
  Echocardiogram 2D Echocardiogram has been performed.  Lance Ramirez 04/23/2019, 12:43 PM

## 2019-04-24 DIAGNOSIS — F0281 Dementia in other diseases classified elsewhere with behavioral disturbance: Secondary | ICD-10-CM

## 2019-04-24 LAB — RENAL FUNCTION PANEL
Albumin: 3.2 g/dL — ABNORMAL LOW (ref 3.5–5.0)
Anion gap: 11 (ref 5–15)
BUN: 16 mg/dL (ref 8–23)
CO2: 26 mmol/L (ref 22–32)
Calcium: 9.1 mg/dL (ref 8.9–10.3)
Chloride: 107 mmol/L (ref 98–111)
Creatinine, Ser: 1.65 mg/dL — ABNORMAL HIGH (ref 0.61–1.24)
GFR calc Af Amer: 44 mL/min — ABNORMAL LOW (ref >60)
GFR calc non Af Amer: 38 mL/min — ABNORMAL LOW (ref >60)
Glucose, Bld: 92 mg/dL (ref 70–99)
Phosphorus: 3 mg/dL (ref 2.5–4.6)
Potassium: 4 mmol/L (ref 3.5–5.1)
Sodium: 144 mmol/L (ref 135–145)

## 2019-04-24 LAB — CBC
HCT: 41.8 % (ref 39.0–52.0)
Hemoglobin: 13.3 g/dL (ref 13.0–17.0)
MCH: 29 pg (ref 26.0–34.0)
MCHC: 31.8 g/dL (ref 30.0–36.0)
MCV: 91.3 fL (ref 80.0–100.0)
Platelets: 118 10*3/uL — ABNORMAL LOW (ref 150–400)
RBC: 4.58 MIL/uL (ref 4.22–5.81)
RDW: 13.9 % (ref 11.5–15.5)
WBC: 5.1 10*3/uL (ref 4.0–10.5)
nRBC: 0 % (ref 0.0–0.2)

## 2019-04-24 LAB — MAGNESIUM: Magnesium: 2 mg/dL (ref 1.7–2.4)

## 2019-04-24 MED ORDER — BENZOCAINE 20 % MT AERO
1.0000 "application " | INHALATION_SPRAY | Freq: Four times a day (QID) | OROMUCOSAL | Status: DC | PRN
  Filled 2019-04-24 (×3): qty 57

## 2019-04-24 MED ORDER — BENZOCAINE 20 % MT AERO
INHALATION_SPRAY | Freq: Four times a day (QID) | OROMUCOSAL | Status: DC | PRN
  Filled 2019-04-24 (×2): qty 57

## 2019-04-24 NOTE — Progress Notes (Signed)
PROGRESS NOTE  Lance Ramirez [id number] DOB: 06-29-1933   PCP: Unk Pinto, MD  Patient is from: home.  Has home care  DOA: 04/21/2019 LOS: 2  Brief Narrative / Interim history: 86 year old male with history of advanced dementia, CVA, CKD-4, diastolic CHF, HTN, BPH and HLD brought to ED with acute confusion, garbled speech and difficulty following command the evening of presentation to ED on 2/11.  Reportedly in his usual state of health up until that time although he has been staying up most of the night lately.  Wife passed away about a month ago.  Has been repeatedly saying " I am leaving to be with your mother" to his daughter Lately.  He has a Emergency planning/management officer who administers his medications.  Daughter doubts about overdose.   In ED, hemodynamically stable.  CMP, CBC ABG and UA without acute significant finding. Troponin and EKG not impressive., lactic acid, B12, ammonia and UDS normal. CT head without acute finding.  COVID-19 negative.  TSH not impressive.  Cortisol 4.1 (slightly low).  CTA head and neck with severe proximal basilar artery stenosis, acute right vertebral artery occlusion and 50 to 70% bilateral carotid artery stenosis.  Neurology consulted and admitted for acute encephalopathy.  EEG with encephalopathy but no seizure activity.  Assessment & Plan: Acute metabolic encephalopathy in patient with advanced dementia: unclear despite extensive work-up including MRI brain, EEG, UDS, ammonia, B12, cortisol, TSH and urine culture.  CTA head and neck as below.  Concern about delirium.  Catatonia?  Lost his wife about a month ago.   -Neurology signed off and recommended outpatient follow-up. -Psychiatry consulted 2/12 and recommended to " consider putting patient on citalopram 10 mg daily once he becomes more alert and awake and consider social consult to facilitate patient referral for grief counseling" Has low B12 level but that does not seem to be the cause of his acute  metabolic encephalopathy.  Continue replacement daily.  B1 level is still pending. -Fall, aspiration and delirium precautions. -Continue D5-1/2NS at 125 cc an hour until he takes p.o. safely.  Dysphagia: Remains n.p.o.  Complains of throat pain.  Will order benzocaine spray.  SLP on board.  They recommended MBS.  Continue IV fluids in the meantime.  Vertebrobasilar insufficiency Bilateral carotid artery stenosis -CTA 75% proximal basilar a. stenosis, "acute" right VA occlusion and 50 to 70% bilateral CA stenosis -MRI brain without acute finding. -Neurology recommended SBP above 140 -IV fluid as above  History of CVA: MRI negative for CVA.  CT findings as above. -Resume Plavix and aspirin when able to take p.o.  Chronic diastolic CHF: Echo in [id number] with EF of 60 to 65% and G1 DD, moderate LAE, PAPP 40. BNP 830 (529 in 2019).  No respiratory distress.  No pulmonary congestion on CXR.  Not on diuretics as above. -IV fluid as above -Closely monitor fluid status  -Follow echocardiogram  CKD-4: Cr better than baseline. -Continue monitoring  Essential hypertension/tachycardia: On multiple antihypertensive medications at home.  BP within fair range. -IV metoprolol 2.5 mg every 6 hours -IV fluid as above  Tachypnea: Resolved.  Chest x-ray negative.  Mood disorder -Hold home medications while NPO. -Psych consulted 2/12.  We will start him on citalopram once able to take p.o.  BPH: On Proscar and Cardura at home. -Monitor urine output -Bladder scan as needed.  Thrombocytopenia: Stable.  No signs of bleeding.  Monitor.  DVT prophylaxis: Subcu heparin Code Status: DNR/DNI Family Communication: Updated patient's daughter over the phone  today.  According to her, patient did not have any delirium or much of her dementia prior.  Discharge barrier: Persistent encephalopathy Patient is from: Home Final disposition: Home with 24-hour aide or home with daughter.  This is what daughter would  prefer.  Consultants: Neurology, psychiatry   Subjective: Seen and examined.  Patient was sitting in chair at nursing station.  He was initially sleeping.  Woke up easily.  He was pleasantly confused.  Objective: Vitals:   04/23/19 1352 04/23/19 1713 04/23/19 2328 04/24/19 0358  BP:  (!) 159/61 (!) 176/69 (!) 163/72  Pulse:  79 76 77  Resp: 20 16 19 20   Temp:  (!) 97.5 F (36.4 C) 97.6 F (36.4 C) (!) 97.5 F (36.4 C)  TempSrc:  Oral Oral Oral  SpO2: 98% 100% 98% 96%    Intake/Output Summary (Last 24 hours) at 04/24/2019 1310 Last data filed at 04/24/2019 [id number] Gross per 24 hour  Intake 2150 ml  Output 2350 ml  Net -200 ml   There were no vitals filed for this visit.  Examination:  General exam: Appears calm and comfortable  Respiratory system: Clear to auscultation. Respiratory effort normal. Cardiovascular system: S1 & S2 heard, RRR. No JVD, murmurs, rubs, gallops or clicks. No pedal edema. Gastrointestinal system: Abdomen is nondistended, soft and nontender. No organomegaly or masses felt. Normal bowel sounds heard. Central nervous system: Alert but disoriented. Skin: No rashes, lesions or ulcers.  Psychiatry: Judgement and insight appear poor. Mood & affect appropriate.   Procedures:  None  Microbiology summarized: [id number] negative. Influenza PCR negative. Urine culture pending.  Sch Meds:  Scheduled Meds:  cyanocobalamin  1,000 mcg Intramuscular Daily   heparin  5,000 Units Subcutaneous Q8H   sodium chloride flush  3 mL Intravenous Once   Continuous Infusions:  dextrose 5 % and 0.45% NaCl Stopped (04/24/19 1113)   thiamine injection 250 mg (04/23/19 1732)   PRN Meds:.Benzocaine, ipratropium-albuterol, metoprolol tartrate  Antimicrobials: Anti-infectives (From admission, onward)   None       I have personally reviewed the following labs and images: CBC: Recent Labs  Lab 04/21/19 2101 04/21/19 2101 04/21/19 2119 04/21/19 2159  04/22/19 0355 04/23/19 0245 04/24/19 0408  WBC 4.7  --   --   --  4.6 6.5 5.1  NEUTROABS 2.6  --   --   --   --   --   --   HGB 13.5   < > 13.6 12.6* 13.0 13.8 13.3  HCT 41.7   < > 40.0 37.0* 41.5 42.8 41.8  MCV 90.1  --   --   --  92.4 90.9 91.3  PLT 130*  --   --   --  105* 104* 118*   < > = values in this interval not displayed.   BMP &GFR Recent Labs  Lab 04/21/19 2101 04/21/19 2101 04/21/19 2119 04/21/19 2119 04/21/19 2159 04/22/19 0130 04/22/19 0355 04/22/19 2251 04/23/19 0245 04/24/19 0408  NA 138   < > 138   < > 137  --  139 141 141 144  K 4.2   < > 4.3   < > 4.1  --  5.1 4.5 4.3 4.0  CL 108   < > 105  --   --   --  107 107 108 107  CO2 21*  --   --   --   --   --  25 25 24 26   GLUCOSE 122*   < > 109*  --   --   --  120* 133* 134* 92  BUN 21   < > 25*  --   --   --  21 20 21 16   CREATININE 1.78*   < > 1.80*   < >  --  1.75* 1.79* 1.84* 1.86* 1.65*  CALCIUM 8.5*  --   --   --   --   --  8.3* 8.7* 8.8* 9.1  MG  --   --   --   --   --  2.0  --   --  2.0 2.0  PHOS  --   --   --   --   --  4.9*  --   --  3.7 3.0   < > = values in this interval not displayed.   CrCl cannot be calculated (Unknown ideal weight.). Liver & Pancreas: Recent Labs  Lab 04/21/19 2101 04/22/19 2251 04/23/19 0245 04/24/19 0408  AST 10* 16  --   --   ALT 11 13  --   --   ALKPHOS 39 43  --   --   BILITOT 0.6 0.8  --   --   PROT 5.0* 5.3*  --   --   ALBUMIN 2.9* 3.0* 2.8* 3.2*   No results for input(s): LIPASE, AMYLASE in the last 168 hours. Recent Labs  Lab 04/21/19 2238 04/22/19 0043  AMMONIA 28 23   Diabetic: No results for input(s): HGBA1C in the last 72 hours. Recent Labs  Lab 04/21/19 2105 04/22/19 2114 04/23/19 0756  GLUCAP 105* 107* 128*   Cardiac Enzymes: No results for input(s): CKTOTAL, CKMB, CKMBINDEX, TROPONINI in the last 168 hours. No results for input(s): PROBNP in the last 8760 hours. Coagulation Profile: Recent Labs  Lab 04/21/19 2101  INR 1.1    Thyroid Function Tests: Recent Labs    04/21/19 2247  TSH 4.536*   Lipid Profile: No results for input(s): CHOL, HDL, LDLCALC, TRIG, CHOLHDL, LDLDIRECT in the last 72 hours. Anemia Panel: Recent Labs    04/21/19 2238  VITAMINB12 210   Urine analysis:    Component Value Date/Time   COLORURINE YELLOW 04/22/2019 0008   APPEARANCEUR CLEAR 04/22/2019 0008   LABSPEC 1.025 04/22/2019 0008   PHURINE 6.0 04/22/2019 0008   GLUCOSEU NEGATIVE 04/22/2019 0008   HGBUR SMALL (A) 04/22/2019 0008   BILIRUBINUR NEGATIVE 04/22/2019 0008   KETONESUR NEGATIVE 04/22/2019 0008   PROTEINUR NEGATIVE 04/22/2019 0008   UROBILINOGEN 0.2 08/09/2013 1557   NITRITE NEGATIVE 04/22/2019 0008   LEUKOCYTESUR SMALL (A) 04/22/2019 0008   Sepsis Labs: Invalid input(s): PROCALCITONIN, [place]  Microbiology: Recent Results (from the past 240 hour(s))  SARS CORONAVIRUS 2 (TAT 6-24 HRS) Nasopharyngeal Nasopharyngeal Swab     Status: None   Collection Time: 04/21/19 11:48 PM   Specimen: Nasopharyngeal Swab  Result Value Ref Range Status   SARS Coronavirus 2 NEGATIVE NEGATIVE Final    Comment: (NOTE) SARS-CoV-2 target nucleic acids are NOT DETECTED. The SARS-CoV-2 RNA is generally detectable in upper and lower respiratory specimens during the acute phase of infection. Negative results do not preclude SARS-CoV-2 infection, do not rule out co-infections with other pathogens, and should not be used as the sole basis for treatment or other patient management decisions. Negative results must be combined with clinical observations, patient history, and epidemiological information. The expected result is Negative. Fact Sheet for Patients: SugarRoll.be Fact Sheet for Healthcare Providers: https://www.woods-mathews.com/ This test is not yet approved or cleared by the [place] FDA and  has been authorized for  detection and/or diagnosis of SARS-CoV-2 by FDA under  an Emergency Use Authorization (EUA). This EUA will remain  in effect (meaning this test can be used) for the duration of the COVID-19 declaration under Section 56 4(b)(1) of the Act, 21 U.S.C. section 360bbb-3(b)(1), unless the authorization is terminated or revoked sooner. Performed at [facility], [place] [address] [place], [place] [address]   Culture, Urine     Status: None   Collection Time: 04/22/19 12:30 AM   Specimen: Urine, Random  Result Value Ref Range Status   Specimen Description URINE, RANDOM  Final   Special Requests NONE  Final   Culture   Final    NO GROWTH Performed at [facility], [place] [address] [place], [place] [address]    Report Status 04/22/2019 FINAL  Final  Culture, blood (routine x 2)     Status: None (Preliminary result)   Collection Time: 04/22/19  4:13 PM   Specimen: BLOOD LEFT HAND  Result Value Ref Range Status   Specimen Description BLOOD LEFT HAND  Final   Special Requests   Final    BOTTLES DRAWN AEROBIC ONLY Blood Culture adequate volume   Culture   Final    NO GROWTH < 24 HOURS Performed at [facility], [place] [address] [place], [place] [address]    Report Status PENDING  Incomplete  Culture, blood (routine x 2)     Status: None (Preliminary result)   Collection Time: 04/22/19  4:13 PM   Specimen: BLOOD  Result Value Ref Range Status   Specimen Description BLOOD RIGHT ANTECUBITAL  Final   Special Requests   Final    BOTTLES DRAWN AEROBIC ONLY Blood Culture adequate volume   Culture   Final    NO GROWTH < 24 HOURS Performed at [facility], [place] [address], [place], [place] [address]    Report Status PENDING  Incomplete    Radiology Studies: No results found.   Darliss Cheney, MD [facility]  If 7PM-7AM, please contact night-coverage [email or web address] 04/24/2019, 1:10 PM

## 2019-04-24 NOTE — Progress Notes (Signed)
  Speech Language Pathology Treatment: Dysphagia  Patient Details Name: Lance Ramirez MRN: [id number] DOB: [birth year] Today's Date: 04/24/2019 Time: [id number] SLP Time Calculation (min) (ACUTE ONLY): 13 min  Assessment / Plan / Recommendation Clinical Impression  Pt was seen for skilled ST targeting PO trials and dysphagia tx.  He was awake/alert and was agreeable to tx session.  RN reported that pt had been asking for water.  Pt consumed trials of thin liquid, nectar-thick liquid, honey-thick liquid, and puree.  He exhibited decreased labial closure around the spoon and prolonged AP transport with all consistencies.  He reported odynophagia with all trials and multiple swallows were observed per bolus, possibly indicating pharyngeal residue or esophageal dysfunction.  A delayed cough was also observed with all PO trials.  Pt would benefit from an instrumental swallow study to further evaluate swallow function.  Recommend continuation of NPO at this time.  Pt may have a few small ice chip PRN given full RN supervision following oral care to help keep oral mucosa moistened and to mobilize swallow musculature.  SLP will f/u per POC.    HPI HPI: Pt is an 86 yo male adm to [facility] found unresponsive.  Pt had emesis and epigastric pain.  CXR showed chronic small left pleural effusion 04/21/2019.  Pt resides at home with 24 hour caregiver per RN.  Pt with hoarseness, h/o CVA, has advanced dementia, CHF, HTN, BPH, HLD.  His MRI showed old thalamic CVA and chronic microvascular changes.  Swallow eval ordered.      SLP Plan  MBS       Recommendations  Diet recommendations: NPO Medication Administration: Via alternative means                Oral Care Recommendations: Oral care QID;Staff/trained caregiver to provide oral care Follow up Recommendations: Other (comment)(TBD) SLP Visit Diagnosis: Dysphagia, oropharyngeal phase (R13.12) Plan: MBS       GO               Colin Mulders., M.S.,  [facility] Office: [phone]  [place] 04/24/2019, 10:05 AM

## 2019-04-24 NOTE — Progress Notes (Signed)
   04/24/19 2149  Vitals  ECG Heart Rate (!) 143  Cardiac Rhythm SVT (RN Wylma Tatem notified)   Pt is sleeping. HR back down to 99.

## 2019-04-25 ENCOUNTER — Inpatient Hospital Stay (HOSPITAL_COMMUNITY): Payer: PPO

## 2019-04-25 LAB — MAGNESIUM: Magnesium: 1.8 mg/dL (ref 1.7–2.4)

## 2019-04-25 LAB — RENAL FUNCTION PANEL
Albumin: 2.9 g/dL — ABNORMAL LOW (ref 3.5–5.0)
Anion gap: 11 (ref 5–15)
BUN: 14 mg/dL (ref 8–23)
CO2: 23 mmol/L (ref 22–32)
Calcium: 8.7 mg/dL — ABNORMAL LOW (ref 8.9–10.3)
Chloride: 107 mmol/L (ref 98–111)
Creatinine, Ser: 1.61 mg/dL — ABNORMAL HIGH (ref 0.61–1.24)
GFR calc Af Amer: 45 mL/min — ABNORMAL LOW (ref >60)
GFR calc non Af Amer: 39 mL/min — ABNORMAL LOW (ref >60)
Glucose, Bld: 111 mg/dL — ABNORMAL HIGH (ref 70–99)
Phosphorus: 2.8 mg/dL (ref 2.5–4.6)
Potassium: 3.3 mmol/L — ABNORMAL LOW (ref 3.5–5.1)
Sodium: 141 mmol/L (ref 135–145)

## 2019-04-25 LAB — CBC
HCT: 38.7 % — ABNORMAL LOW (ref 39.0–52.0)
Hemoglobin: 12.9 g/dL — ABNORMAL LOW (ref 13.0–17.0)
MCH: 29.8 pg (ref 26.0–34.0)
MCHC: 33.3 g/dL (ref 30.0–36.0)
MCV: 89.4 fL (ref 80.0–100.0)
Platelets: 110 10*3/uL — ABNORMAL LOW (ref 150–400)
RBC: 4.33 MIL/uL (ref 4.22–5.81)
RDW: 13.9 % (ref 11.5–15.5)
WBC: 5.4 10*3/uL (ref 4.0–10.5)
nRBC: 0 % (ref 0.0–0.2)

## 2019-04-25 MED ORDER — METOPROLOL TARTRATE 5 MG/5ML IV SOLN
5.0000 mg | Freq: Four times a day (QID) | INTRAVENOUS | Status: DC
  Administered 2019-04-25 – 2019-04-26 (×4): 5 mg via INTRAVENOUS
  Filled 2019-04-25 (×4): qty 5

## 2019-04-25 MED ORDER — POTASSIUM CHLORIDE CRYS ER 20 MEQ PO TBCR: 40.0000 meq | EXTENDED_RELEASE_TABLET | Freq: Three times a day (TID) | ORAL | Status: DC

## 2019-04-25 MED ORDER — POTASSIUM CHLORIDE 10 MEQ/100ML IV SOLN
10.0000 meq | INTRAVENOUS | Status: AC
  Administered 2019-04-25 (×5): 10 meq via INTRAVENOUS
  Filled 2019-04-25 (×4): qty 100

## 2019-04-25 MED ORDER — RESOURCE THICKENUP CLEAR PO POWD
ORAL | Status: DC | PRN
  Filled 2019-04-25: qty 125

## 2019-04-25 MED ORDER — POTASSIUM CHLORIDE 10 MEQ/100ML IV SOLN
INTRAVENOUS | Status: AC
  Filled 2019-04-25: qty 100

## 2019-04-25 NOTE — Progress Notes (Signed)
   04/25/19 0224  Vitals  ECG Heart Rate (!) 163  Cardiac Rhythm SVT (RN Aailyah Dunbar notified)

## 2019-04-25 NOTE — Progress Notes (Signed)
Pt HR sustained above 150 for a few minutes. RN went to go give the pt lopressor and HR was back down under 100s. Pt is still sleeping.

## 2019-04-25 NOTE — Progress Notes (Signed)
PT Cancellation Note  Patient Details Name: Lance Ramirez MRN: [id number] DOB: 05/08/1933   Cancelled Treatment:    Reason Eval/Treat Not Completed: Patient declined, no reason specified. Reports he is feeling sleepy. Will return tomorrow to see patient.    [name] 04/25/2019, 2:38 PM

## 2019-04-25 NOTE — Progress Notes (Signed)
Occupational Therapy Treatment Patient Details Name: Lance Ramirez MRN: [id number] DOB: [birth year] Today's Date: 04/25/2019    History of present illness 86 yo admitted with confusion, garbled speech and metabolic encephalopathy. Wife recently passed away. PMhx: dementia, CVA, CKD, CHF, HTN, BPH, HLD   OT comments  Pt much improved this session compared to last. Pt alert and oriented x4 and following commands consistently. Pt able to transfer with min guard using the RW and complete toileting tasks in standing with min guard- min A. With proper 24/7 support and (S) pt safe to d/c home and continue [place].    Follow Up Recommendations  Home health OT;Supervision/Assistance - 24 hour    Equipment Recommendations  None recommended by OT       Precautions / Restrictions Precautions Precautions: Fall Restrictions Weight Bearing Restrictions: No       Mobility Bed Mobility Overal bed mobility: Needs Assistance Bed Mobility: Supine to Sit;Sit to Supine     Supine to sit: Supervision Sit to supine: Supervision   General bed mobility comments: No physical assist needed   Transfers Overall transfer level: Needs assistance Equipment used: Rolling walker (2 wheeled) Transfers: Sit to/from [facility] Sit to Stand: Min guard Stand pivot transfers: Min guard       General transfer comment: min guard for balance support    Balance Overall balance assessment: Needs assistance Sitting-balance support: Feet supported;No upper extremity supported Sitting balance-Leahy Scale: Fair     Standing balance support: No upper extremity supported;During functional activity Standing balance-Leahy Scale: Fair Standing balance comment: improved balance with RW but able to maintain static standing balance without                           ADL either performed or assessed with clinical judgement   ADL Overall ADL's : Needs assistance/impaired     Grooming:  Wash/dry face;Wash/dry hands;Oral care;Set up;Sitting           Upper Body Dressing : Minimal assistance;Sitting Upper Body Dressing Details (indicate cue type and reason): to don new gown Lower Body Dressing: Minimal assistance;Sit to/from stand   Toilet Transfer: Min guard;RW           Functional mobility during ADLs: Minimal assistance;Rolling walker General ADL Comments: very little lifting assistance needed this session, improved balance overall                Cognition Arousal/Alertness: Awake/alert Behavior During Therapy: WFL for tasks assessed/performed Overall Cognitive Status: No family/caregiver present to determine baseline cognitive functioning                                 General Comments: Pt much better cognitively this session- he was alert and oriented once awoken. Pt required increased time for sequencing but was otherwise able to follow all directions with min verbal cueing and demonstrated no impulsivity this session              General Comments HR to 130 bpm while standing    Pertinent Vitals/ Pain       Pain Assessment: No/denies pain         Frequency  Min 2X/week        Progress Toward Goals  OT Goals(current goals can now be found in the care plan section)  Progress towards OT goals: Progressing toward goals  Acute Rehab OT Goals Patient Stated Goal:  None stated OT Goal Formulation: With patient Time For Goal Achievement: 05/07/19 Potential to Achieve Goals: Good  Plan Discharge plan remains appropriate       AM-PAC OT "6 Clicks" Daily Activity     Outcome Measure   Help from another person eating meals?: A Little Help from another person taking care of personal grooming?: A Little Help from another person toileting, which includes using toliet, bedpan, or urinal?: A Little Help from another person bathing (including washing, rinsing, drying)?: A Little Help from another person to put on and taking off  regular upper body clothing?: A Little Help from another person to put on and taking off regular lower body clothing?: A Little 6 Click Score: 18    End of Session Equipment Utilized During Treatment: Gait belt;Rolling walker  OT Visit Diagnosis: Unsteadiness on feet (R26.81);Other abnormalities of gait and mobility (R26.89);Muscle weakness (generalized) (M62.81);Other symptoms and signs involving cognitive function;Pain   Activity Tolerance Patient tolerated treatment well;Patient limited by pain   Patient Left in bed;with call bell/phone within reach;with bed alarm set;with family/visitor present   Nurse Communication Mobility status;Other (comment)(HR)        Time: [id number] OT Time Calculation (min): 20 min  Charges: OT General Charges $OT Visit: 1 Visit OT Treatments $Self Care/Home Management : 8-22 mins   Curtis Sites OTR/L  04/25/2019, 12:02 PM

## 2019-04-25 NOTE — Progress Notes (Signed)
PROGRESS NOTE  Lance Ramirez [id number] DOB: May 12, 1933   PCP: Unk Pinto, MD  Patient is from: home.  Has home care  DOA: 04/21/2019 LOS: 3  Brief Narrative / Interim history: 86 year old male with history of advanced dementia, CVA, CKD-4, diastolic CHF, HTN, BPH and HLD brought to ED with acute confusion, garbled speech and difficulty following command the evening of presentation to ED on 2/11.  Reportedly in his usual state of health up until that time although he has been staying up most of the night lately.  Wife passed away about a month ago.  Has been repeatedly saying " I am leaving to be with your mother" to his daughter Lately.  He has a Emergency planning/management officer who administers his medications.  Daughter doubts about overdose.   In ED, hemodynamically stable.  CMP, CBC ABG and UA without acute significant finding. Troponin and EKG not impressive., lactic acid, B12, ammonia and UDS normal. CT head without acute finding.  COVID-19 negative.  TSH not impressive.  Cortisol 4.1 (slightly low).  CTA head and neck with severe proximal basilar artery stenosis, acute right vertebral artery occlusion and 50 to 70% bilateral carotid artery stenosis.  Neurology consulted and admitted for acute encephalopathy.  EEG with encephalopathy but no seizure activity.  Assessment & Plan: Acute metabolic encephalopathy in patient with advanced dementia: unclear despite extensive work-up including MRI brain, EEG, UDS, ammonia, B12, cortisol, TSH and urine culture.  CTA head and neck as below.  Concern about delirium.  Catatonia?  Lost his wife about a month ago.   -Neurology signed off and recommended outpatient follow-up. -Psychiatry consulted 2/12 and recommended to " consider putting patient on citalopram 10 mg daily once he becomes more alert and awake and consider social consult to facilitate patient referral for grief counseling" Has low B12 level but that does not seem to be the cause of his acute  metabolic encephalopathy.  Continue replacement daily.  B1 level is still pending. -Fall, aspiration and delirium precautions. -Continue D5-1/2NS at 125 cc an hour until he takes p.o. safely.  Dysphagia: Remains n.p.o. SLP on board.  Plan for MBS today.  Vertebrobasilar insufficiency Bilateral carotid artery stenosis -CTA 75% proximal basilar a. stenosis, "acute" right VA occlusion and 50 to 70% bilateral CA stenosis -MRI brain without acute finding. -Neurology recommended SBP above 140 -IV fluid as above  History of CVA: MRI negative for CVA.  CT findings as above. -Resume Plavix and aspirin when able to take p.o.  Chronic diastolic CHF: Echo in [id number] with EF of 60 to 65% and G1 DD, moderate LAE, PAPP 40. BNP 830 (529 in 2019).  No respiratory distress.  No pulmonary congestion on CXR.  Not on diuretics as above. -IV fluid as above -Closely monitor fluid status  -Follow echocardiogram  CKD-4: Cr better than baseline. -Continue monitoring  Essential hypertension/tachycardia: On multiple antihypertensive medications at home.  Blood pressure slightly elevated.  Increase his metoprolol to 5 mg every 6 hours yesterday.  Will increase it to 10 mg every 6 hours now.  Tachypnea: Resolved.  Chest x-ray negative.  Mood disorder -Hold home medications while NPO. -Psych consulted 2/12.  We will start him on citalopram once able to take p.o.  BPH: On Proscar and Cardura at home. -Monitor urine output -Bladder scan as needed.  Thrombocytopenia: Stable.  No signs of bleeding.  Monitor.  Hypokalemia: Replenish today and recheck in the morning.  DVT prophylaxis: Subcu heparin Code Status: DNR/DNI Family Communication: Updated patient's daughter  over the phone on 04/24/2019.  According to her, patient did not have any delirium or much of her dementia prior.  Discharge barrier: Persistent encephalopathy Patient is from: Home Final disposition: Home with 24-hour aide or home with daughter.   This is what daughter would prefer.  Consultants: Neurology, psychiatry   Subjective: Patient seen and examined.  Patient lethargic and pleasantly confused.  Has no complaint.  Objective: Vitals:   04/24/19 1742 04/24/19 2043 04/25/19 0006 04/25/19 0424  BP: (!) 192/90 (!) 177/68 (!) 184/79 (!) 170/73  Pulse: 89 89 75 78  Resp: 20 19 19 18   Temp: 98.3 F (36.8 C) 98.2 F (36.8 C) 97.7 F (36.5 C) 97.7 F (36.5 C)  TempSrc: Oral Oral Oral Oral  SpO2: 99% (!) 89% 98% 91%    Intake/Output Summary (Last 24 hours) at 04/25/2019 1110 Last data filed at 04/25/2019 0424 Gross per 24 hour  Intake 2071.47 ml  Output 1800 ml  Net 271.47 ml   There were no vitals filed for this visit.  Examination:  General exam: Appears calm and comfortable but lethargic Respiratory system: Clear to auscultation. Respiratory effort normal. Cardiovascular system: S1 & S2 heard, RRR. No JVD, murmurs, rubs, gallops or clicks. No pedal edema. Gastrointestinal system: Abdomen is nondistended, soft and nontender. No organomegaly or masses felt. Normal bowel sounds heard. Central nervous system: Lethargic and pleasantly confused.  Moving all extremities spontaneously.  No focal deficit. Extremities: Symmetric 5 x 5 power. Skin: No rashes, lesions or ulcers.   Procedures:  None  Microbiology summarized: [id number] negative. Influenza PCR negative. Urine culture pending.  Sch Meds:  Scheduled Meds: . cyanocobalamin  1,000 mcg Intramuscular Daily  . heparin  5,000 Units Subcutaneous Q8H  . metoprolol tartrate  5 mg Intravenous Q6H  . potassium chloride  40 mEq Oral TID  . sodium chloride flush  3 mL Intravenous Once   Continuous Infusions: . dextrose 5 % and 0.45% NaCl 125 mL/hr at 04/24/19 1859  . thiamine injection 250 mg (04/24/19 1728)   PRN Meds:.Benzocaine, ipratropium-albuterol  Antimicrobials: Anti-infectives (From admission, onward)   None       I have personally reviewed the  following labs and images: CBC: Recent Labs  Lab 04/21/19 2101 04/21/19 2119 04/21/19 2159 04/22/19 0355 04/23/19 0245 04/24/19 0408 04/25/19 0433  WBC 4.7  --   --  4.6 6.5 5.1 5.4  NEUTROABS 2.6  --   --   --   --   --   --   HGB 13.5   < > 12.6* 13.0 13.8 13.3 12.9*  HCT 41.7   < > 37.0* 41.5 42.8 41.8 38.7*  MCV 90.1  --   --  92.4 90.9 91.3 89.4  PLT 130*  --   --  105* 104* 118* 110*   < > = values in this interval not displayed.   BMP &GFR Recent Labs  Lab 04/21/19 2119 04/22/19 0130 04/22/19 0355 04/22/19 2251 04/23/19 0245 04/24/19 0408 04/25/19 0433  NA   < >  --  139 141 141 144 141  K   < >  --  5.1 4.5 4.3 4.0 3.3*  CL   < >  --  107 107 108 107 107  CO2   < >  --  25 25 24 26 23   GLUCOSE   < >  --  120* 133* 134* 92 111*  BUN   < >  --  21 20 21 16 14   CREATININE   < >  1.75* 1.79* 1.84* 1.86* 1.65* 1.61*  CALCIUM   < >  --  8.3* 8.7* 8.8* 9.1 8.7*  MG  --  2.0  --   --  2.0 2.0 1.8  PHOS  --  4.9*  --   --  3.7 3.0 2.8   < > = values in this interval not displayed.   CrCl cannot be calculated (Unknown ideal weight.). Liver & Pancreas: Recent Labs  Lab 04/21/19 2101 04/22/19 2251 04/23/19 0245 04/24/19 0408 04/25/19 0433  AST 10* 16  --   --   --   ALT 11 13  --   --   --   ALKPHOS 39 43  --   --   --   BILITOT 0.6 0.8  --   --   --   PROT 5.0* 5.3*  --   --   --   ALBUMIN 2.9* 3.0* 2.8* 3.2* 2.9*   No results for input(s): LIPASE, AMYLASE in the last 168 hours. Recent Labs  Lab 04/21/19 2238 04/22/19 0043  AMMONIA 28 23   Diabetic: No results for input(s): HGBA1C in the last 72 hours. Recent Labs  Lab 04/21/19 2105 04/22/19 2114 04/23/19 0756  GLUCAP 105* 107* 128*   Cardiac Enzymes: No results for input(s): CKTOTAL, CKMB, CKMBINDEX, TROPONINI in the last 168 hours. No results for input(s): PROBNP in the last 8760 hours. Coagulation Profile: Recent Labs  Lab 04/21/19 2101  INR 1.1   Thyroid Function Tests: No results for  input(s): TSH, T4TOTAL, FREET4, T3FREE, THYROIDAB in the last 72 hours. Lipid Profile: No results for input(s): CHOL, HDL, LDLCALC, TRIG, CHOLHDL, LDLDIRECT in the last 72 hours. Anemia Panel: No results for input(s): VITAMINB12, FOLATE, FERRITIN, TIBC, IRON, RETICCTPCT in the last 72 hours. Urine analysis:    Component Value Date/Time   COLORURINE YELLOW 04/22/2019 0008   APPEARANCEUR CLEAR 04/22/2019 0008   LABSPEC 1.025 04/22/2019 0008   PHURINE 6.0 04/22/2019 0008   GLUCOSEU NEGATIVE 04/22/2019 0008   HGBUR SMALL (A) 04/22/2019 0008   BILIRUBINUR NEGATIVE 04/22/2019 0008   KETONESUR NEGATIVE 04/22/2019 0008   PROTEINUR NEGATIVE 04/22/2019 0008   UROBILINOGEN 0.2 08/09/2013 1557   NITRITE NEGATIVE 04/22/2019 0008   LEUKOCYTESUR SMALL (A) 04/22/2019 0008   Sepsis Labs: Invalid input(s): PROCALCITONIN, [place]  Microbiology: Recent Results (from the past 240 hour(s))  SARS CORONAVIRUS 2 (TAT 6-24 HRS) Nasopharyngeal Nasopharyngeal Swab     Status: None   Collection Time: 04/21/19 11:48 PM   Specimen: Nasopharyngeal Swab  Result Value Ref Range Status   SARS Coronavirus 2 NEGATIVE NEGATIVE Final    Comment: (NOTE) SARS-CoV-2 target nucleic acids are NOT DETECTED. The SARS-CoV-2 RNA is generally detectable in upper and lower respiratory specimens during the acute phase of infection. Negative results do not preclude SARS-CoV-2 infection, do not rule out co-infections with other pathogens, and should not be used as the sole basis for treatment or other patient management decisions. Negative results must be combined with clinical observations, patient history, and epidemiological information. The expected result is Negative. Fact Sheet for Patients: SugarRoll.be Fact Sheet for Healthcare Providers: https://www.woods-mathews.com/ This test is not yet approved or cleared by the [place] FDA and  has been authorized for detection  and/or diagnosis of SARS-CoV-2 by FDA under an Emergency Use Authorization (EUA). This EUA will remain  in effect (meaning this test can be used) for the duration of the COVID-19 declaration under Section 56 4(b)(1) of the Act, 21 U.S.C. section 360bbb-3(b)(1), unless  the authorization is terminated or revoked sooner. Performed at [facility], [place] [address], [place], [place] [address]   Culture, Urine     Status: None   Collection Time: 04/22/19 12:30 AM   Specimen: Urine, Random  Result Value Ref Range Status   Specimen Description URINE, RANDOM  Final   Special Requests NONE  Final   Culture   Final    NO GROWTH Performed at [facility], [place] [address], [place], [place] [address]    Report Status 04/22/2019 FINAL  Final  Culture, blood (routine x 2)     Status: None (Preliminary result)   Collection Time: 04/22/19  4:13 PM   Specimen: BLOOD LEFT HAND  Result Value Ref Range Status   Specimen Description BLOOD LEFT HAND  Final   Special Requests   Final    BOTTLES DRAWN AEROBIC ONLY Blood Culture adequate volume   Culture   Final    NO GROWTH 3 DAYS Performed at [facility], [place] [address], [place], [place] [address]    Report Status PENDING  Incomplete  Culture, blood (routine x 2)     Status: None (Preliminary result)   Collection Time: 04/22/19  4:13 PM   Specimen: BLOOD  Result Value Ref Range Status   Specimen Description BLOOD RIGHT ANTECUBITAL  Final   Special Requests   Final    BOTTLES DRAWN AEROBIC ONLY Blood Culture adequate volume   Culture   Final    NO GROWTH 3 DAYS Performed at [facility], [place] [address], [place], [place] [address]    Report Status PENDING  Incomplete    Radiology Studies: No results found.   Darliss Cheney, MD [facility]  If 7PM-7AM, please contact night-coverage [email or web address] 04/25/2019, 11:10 AM

## 2019-04-25 NOTE — Progress Notes (Signed)
Modified Barium Swallow Progress Note  Patient Details  Name: Lance Ramirez MRN: [id number] Date of Birth: 06/27/1933  Today's Date: 04/25/2019  Modified Barium Swallow completed.  Full report located under Chart Review in the Imaging Section.  Brief recommendations include the following:  Clinical Impression  Pt presents with moderate oropharyngeal dysphaiga across all consistencies. Oral phase was remarkable for decreased bolus cohesion, oral residue and delayed oral transit. Pharyngeal phase was remarkable for reduced tongue base retraction resulting in vallecular residue, and reduced laryngeal closure and a timing deficit resulting in penetration/aspiration. Aspiration occured with thin liquids and nectar thick liquids. Trialed multiple compensatory strategies, however, the pt was unable to sustain proper postioning for strategies to be effective, d/t physical limitations and cognition, and pt continued to aspirate nectar thick liquids. Pt was also a bit impulsive, continuing to take large bites/sips even when cued to take smaller ones. Considering the pt's pharyngeal residue with solids and implusivity, recommend dys 3 (soft solids) and honey thick liquids via cup, no straws. Pt should try to take smaller bites/sips with cueing from staff and full supervision while eating/drinking. Meds may be taken whole with puree, unless they are large (in which case, crush with puree).   Swallow Evaluation Recommendations       SLP Diet Recommendations: Dysphagia 3 (Mech soft) solids;Honey thick liquids   Liquid Administration via: Cup;No straw   Medication Administration: Other (Comment)(whole with puree, crush if large)   Supervision: Full supervision/cueing for compensatory strategies   Compensations: Minimize environmental distractions;Small sips/bites;Slow rate   Postural Changes: Remain semi-upright after after feeds/meals (Comment);Seated upright at 90 degrees   Oral Care  Recommendations: Oral care BID       Aline August, Student SLP Office: [phone]  04/25/2019,2:31 PM

## 2019-04-26 LAB — RENAL FUNCTION PANEL
Albumin: 2.8 g/dL — ABNORMAL LOW (ref 3.5–5.0)
Anion gap: 6 (ref 5–15)
BUN: 12 mg/dL (ref 8–23)
CO2: 22 mmol/L (ref 22–32)
Calcium: 8.7 mg/dL — ABNORMAL LOW (ref 8.9–10.3)
Chloride: 112 mmol/L — ABNORMAL HIGH (ref 98–111)
Creatinine, Ser: 1.72 mg/dL — ABNORMAL HIGH (ref 0.61–1.24)
GFR calc Af Amer: 42 mL/min — ABNORMAL LOW (ref >60)
GFR calc non Af Amer: 36 mL/min — ABNORMAL LOW (ref >60)
Glucose, Bld: 124 mg/dL — ABNORMAL HIGH (ref 70–99)
Phosphorus: 2.4 mg/dL — ABNORMAL LOW (ref 2.5–4.6)
Potassium: 3.7 mmol/L (ref 3.5–5.1)
Sodium: 140 mmol/L (ref 135–145)

## 2019-04-26 LAB — CBC
HCT: 40.5 % (ref 39.0–52.0)
Hemoglobin: 13.3 g/dL (ref 13.0–17.0)
MCH: 29.6 pg (ref 26.0–34.0)
MCHC: 32.8 g/dL (ref 30.0–36.0)
MCV: 90 fL (ref 80.0–100.0)
Platelets: 122 10*3/uL — ABNORMAL LOW (ref 150–400)
RBC: 4.5 MIL/uL (ref 4.22–5.81)
RDW: 13.9 % (ref 11.5–15.5)
WBC: 6.3 10*3/uL (ref 4.0–10.5)
nRBC: 0 % (ref 0.0–0.2)

## 2019-04-26 LAB — GLUCOSE, CAPILLARY: Glucose-Capillary: 116 mg/dL — ABNORMAL HIGH (ref 70–99)

## 2019-04-26 LAB — MAGNESIUM: Magnesium: 1.7 mg/dL (ref 1.7–2.4)

## 2019-04-26 MED ORDER — ASPIRIN EC 81 MG PO TBEC
81.0000 mg | DELAYED_RELEASE_TABLET | Freq: Every day | ORAL | Status: DC
  Administered 2019-04-26: 12:00:00 81 mg via ORAL
  Filled 2019-04-26: qty 1

## 2019-04-26 MED ORDER — METOPROLOL TARTRATE 25 MG PO TABS: 25.0000 mg | ORAL_TABLET | Freq: Two times a day (BID) | ORAL | Status: DC

## 2019-04-26 MED ORDER — DOXAZOSIN MESYLATE 8 MG PO TABS
8.0000 mg | ORAL_TABLET | Freq: Every day | ORAL | Status: DC
  Filled 2019-04-26: qty 1

## 2019-04-26 MED ORDER — PANTOPRAZOLE SODIUM 40 MG PO TBEC
40.0000 mg | DELAYED_RELEASE_TABLET | Freq: Every day | ORAL | Status: DC
  Administered 2019-04-26: 40 mg via ORAL
  Filled 2019-04-26: qty 1

## 2019-04-26 MED ORDER — BISOPROLOL-HYDROCHLOROTHIAZIDE 10-6.25 MG PO TABS
1.0000 | ORAL_TABLET | Freq: Every day | ORAL | Status: DC
  Administered 2019-04-26: 1 via ORAL
  Filled 2019-04-26: qty 1

## 2019-04-26 MED ORDER — AMLODIPINE BESYLATE 5 MG PO TABS: 5.0000 mg | ORAL_TABLET | Freq: Every day | ORAL | 0 refills | Status: AC

## 2019-04-26 MED ORDER — QUETIAPINE FUMARATE 25 MG PO TABS
25.0000 mg | ORAL_TABLET | Freq: Every day | ORAL | Status: DC
  Administered 2019-04-26: 25 mg via ORAL
  Filled 2019-04-26: qty 1

## 2019-04-26 MED ORDER — TAMSULOSIN HCL 0.4 MG PO CAPS: 0.4000 mg | ORAL_CAPSULE | Freq: Every day | ORAL | Status: DC

## 2019-04-26 MED ORDER — VITAMIN B-12 100 MCG PO TABS: 100.0000 ug | ORAL_TABLET | Freq: Every day | ORAL | 0 refills | Status: AC

## 2019-04-26 MED ORDER — ROSUVASTATIN CALCIUM 20 MG PO TABS
20.0000 mg | ORAL_TABLET | Freq: Every day | ORAL | Status: DC
  Administered 2019-04-26: 12:00:00 20 mg via ORAL
  Filled 2019-04-26: qty 1

## 2019-04-26 MED ORDER — IRBESARTAN 300 MG PO TABS
300.0000 mg | ORAL_TABLET | Freq: Every day | ORAL | Status: DC
  Administered 2019-04-26: 300 mg via ORAL
  Filled 2019-04-26: qty 1

## 2019-04-26 MED ORDER — CITALOPRAM HYDROBROMIDE 40 MG PO TABS
40.0000 mg | ORAL_TABLET | Freq: Every day | ORAL | Status: DC
  Administered 2019-04-26: 40 mg via ORAL
  Filled 2019-04-26: qty 1

## 2019-04-26 MED ORDER — CLOPIDOGREL BISULFATE 75 MG PO TABS
75.0000 mg | ORAL_TABLET | Freq: Every day | ORAL | Status: DC
  Administered 2019-04-26: 75 mg via ORAL
  Filled 2019-04-26: qty 1

## 2019-04-26 MED ORDER — FINASTERIDE 5 MG PO TABS
5.0000 mg | ORAL_TABLET | Freq: Every day | ORAL | Status: DC
  Administered 2019-04-26: 5 mg via ORAL
  Filled 2019-04-26: qty 1

## 2019-04-26 NOTE — Progress Notes (Addendum)
  Speech Language Pathology Treatment: Dysphagia  Patient Details Name: Lance Ramirez MRN: [id number] DOB: Feb 21, 1934 Today's Date: 04/26/2019 Time: [id number] SLP Time Calculation (min) (ACUTE ONLY): 15 min  Assessment / Plan / Recommendation Clinical Impression  Saw pt for ongoing dysphagia treatment following MBSS 2/15 with recommendations for mechanical soft diet with honey thick liquid.  Pt did not recall any recommendations from yesterday's swallowing study. Provided education regarding results of MBSS and rationale for modified diet consistency.  Discussed risk of aspiration pneumonia.  Pt expressed distaste for honey thick liquids.    Introduced swallowing exercises to pt.  Pt demonstrated ability to complete CTAR, effortful swallow, VF adduction exercise, and had fair attempt at [facility].  Pt was not able to execute Masako maneuver.  Discussed continued ST at next level of care to address swallowing deficits and hopefully improve swallow function to allow to advance liquid consistency.  Pt states he does not want continued ST and that he is just waiting to die, given the recent loss of his wife.  Pt may benefit from continued ST for possible HEP, or to navigate compliance with diet recommends versus quality of life.   Pt c/o odynophagia L>R.  Some red spots were noted on posterior soft palate with whiter centers.  These may extend into pharynx as well.  Pt reports decreased appetite.  It is unclear if this is related to modified diet, odynophagia, or possible depressive symptoms.    Attempted to call daughter to discuss diet recommendations, but she was unavailable.  Left voicemail with rehab office number.  Please speak with SLPs Mickel Baas or Marliss Czar prior to d/c.   HPI HPI: Pt is an 86 yo male adm to [facility] found unresponsive.  Pt had emesis and epigastric pain.  CXR showed chronic small left pleural effusion 04/21/2019.  Pt resides at home with 24 hour caregiver per RN.  Pt with hoarseness, h/o  CVA, has advanced dementia, CHF, HTN, BPH, HLD.  His MRI showed old thalamic CVA and chronic microvascular changes.  Swallow eval ordered.       SLP Plan  Continue with current plan of care       Recommendations  Diet recommendations: Dysphagia 3 (mechanical soft);Honey-thick liquid Liquids provided via: Cup Medication Administration: Whole meds with puree(crush if needed) Supervision: Full supervision/cueing for compensatory strategies Compensations: Minimize environmental distractions;Small sips/bites;Slow rate Postural Changes and/or Swallow Maneuvers: Seated upright 90 degrees                Oral Care Recommendations: Oral care BID Follow up Recommendations: Home health SLP;Outpatient SLP;Skilled Nursing facility;24 hour supervision/assistance SLP Visit Diagnosis: Dysphagia, oropharyngeal phase (R13.12) Plan: Continue with current plan of care       [place], [place], [place] Office: [phone]  04/26/2019, 11:37 AM

## 2019-04-26 NOTE — Progress Notes (Signed)
Pt given discharge summary and discharged home via daughter as transportation. Pt discharged with foley catheter.

## 2019-04-26 NOTE — Care Management Important Message (Signed)
Important Message  Patient Details  Name: Lance Ramirez MRN: [id number] Date of Birth: [birth year]   Medicare Important Message Given:  Yes     [name] [address] 04/26/2019, 1:53 PM

## 2019-04-26 NOTE — Discharge Summary (Addendum)
Physician Discharge Summary  Lance Ramirez [id number] DOB: 1933-08-27 DOA: 04/21/2019  PCP: Unk Pinto, MD  Admit date: 04/21/2019 Discharge date: 04/26/2019  Admitted From: Home Disposition: Home  Recommendations for Outpatient Follow-up:  1. Follow up with PCP in 1-2 weeks 2. Follow with neurology in 4 weeks 3. Please obtain BMP/CBC in one week 4. Please follow up on the following pending results:  Home Health: Yes Equipment/Devices: None  Discharge Condition: Stable CODE STATUS: DNR Diet recommendation: Cardiac  Subjective: Patient seen and examined this morning.  He was sitting in the bed and eating his breakfast.  He was much more alert today compared to last 2 days that I had seen him and he was also oriented x1.  This is also improved.  No complaints.  Brief/Interim Summary: 86 year old male with history of advanced dementia, CVA, CKD-4, diastolic CHF, HTN, BPH and HLD brought to ED with acute confusion, garbled speech and difficulty following command the evening of presentation to ED on 2/11.  Reportedly he was in his usual state of health up until that time although he has been staying up most of the night lately.  Wife passed away about a month ago.  Has been repeatedly saying " I am leaving to be with your mother" to his daughter Lately.  He has a medical aide who administers his medications.  Daughter doubts about overdose.   In ED, hemodynamically stable.  CMP, CBC ABG and UA without acute significant finding. Troponin and EKG not impressive., lactic acid, B12, ammonia and UDS normal. CT head without acute finding.  COVID-19 negative.  TSH not impressive.  Cortisol 4.1 (slightly low).  CTA head and neck with severe proximal basilar artery stenosis, acute right vertebral artery occlusion and 50 to 70% bilateral carotid artery stenosis.  This was followed by CT cerebral perfusion with contrast. Neurology was on board and did not recommend any further intervention  regarding those occlusions/stenosis found on CT cerebral perfusion and CT angiogram of head and neck however they recommended to keep his systolic blood pressure between the range of 140 and 160 and advised to resume his aspirin and Plavix when able to. EEG with encephalopathy but no seizure activity.  Patient had slightly low B12 level which did not seem to be the cause of his acute encephalopathy either however he received replacement through IV while here.  He was continued on IV fluids since he was having trouble swallowing.  Psychiatry was also consulted and the only recommendation they made was to consider placing him on citalopram when he is more awake however citalopram was one of his home medications which is being continued at discharge.  While he was n.p.o., his blood pressure was managed with IV Lopressor.  His thrombocytopenia remained stable.  He was then more alert day before yesterday so SLP was reconsulted and patient underwent MBS and was started on dysphagia 3 diet and honey thick liquid.  SLP has reassessed him today and they have recommended to discharge him on dysphagia 3 diet and honey thick liquid.  SLP to contact patient's daughter for education and answering any further questions.  I personally spoke to patient's daughter Edrin Sampley 2 days ago and then again today and she is aware that PT and OT have recommended 24-hour supervision with home health.  She has stated that the patient already had home health 7 days a week for several hours a day but now she will make sure that she arranges 24-hour / 7 days home health  aide with the help of her brother and her being available for taking care of the patient and that she is agreeable to take the patient home today as there is no further medical treatment that is left over for him to stay as inpatient.  Orders for home health for PT, OT and SLP has been placed.  Due to his encephalopathy and acute urinary retention, Foley catheter was placed  few days ago.  Foley catheter will be removed today and patient will be kept for at least 6 to 8 hours after that to provide him ample time for voiding trial.  I have personally called the nurse over the phone and have advised as such.  She verbalized understanding as well.  His blood pressure remained elevated with the systolic in the range of [id number].  I have resumed all his home medications and have added amlodipine 5 mg p.o. daily.   Discharge Diagnoses:  Principal Problem:   Dementia with behavioral disturbance ([facility]) Active Problems:   Unresponsiveness   Acute metabolic encephalopathy    Discharge Instructions  Discharge Instructions    Discharge patient   Complete by: As directed    Discharge disposition: 06-Home-Health Care Svc   Discharge patient date: 04/26/2019     Allergies as of 04/26/2019      Reactions   Alprazolam Other (See Comments)   OverSedation   Sulfa Antibiotics    Pt unsure of reaction      Medication List    TAKE these medications   amLODipine 5 MG tablet Commonly known as: NORVASC Take 1 tablet (5 mg total) by mouth daily.   aspirin EC 81 MG tablet Take 81 mg by mouth daily.   bisoprolol-hydrochlorothiazide 10-6.25 MG tablet Commonly known as: ZIAC Take 1 tablet Daily for BP What changed:   how much to take  how to take this  when to take this  additional instructions   buPROPion 300 MG 24 hr tablet Commonly known as: WELLBUTRIN XL Take 1 tablet every morning for Mood What changed:   how much to take  how to take this  when to take this  additional instructions   citalopram 40 MG tablet Commonly known as: CELEXA Take 1 tablet Daily for Mood What changed:   how much to take  how to take this  when to take this  additional instructions   clopidogrel 75 MG tablet Commonly known as: PLAVIX TAKE 1 TABLET DAILY TO PREVENT STROKE What changed: See the new instructions.   doxazosin 8 MG tablet Commonly known as:  Cardura Take 1 tablet at Bedtime for BP What changed:   how much to take  how to take this  when to take this  additional instructions   finasteride 5 MG tablet Commonly known as: PROSCAR Take 1 tablet Daily for Prostate What changed:   how much to take  how to take this  when to take this  additional instructions   Melatonin 3 MG Tabs Take 3 mg by mouth at bedtime.   minoxidil 10 MG tablet Commonly known as: LONITEN TAKE 1 TABLET DAILY FOR BLOOD PRESSURE What changed:   how much to take  how to take this  when to take this  additional instructions   olmesartan 40 MG tablet Commonly known as: BENICAR Take 1/2 to 1 tablet Daily  for BP What changed:   how much to take  how to take this  when to take this  additional instructions   pantoprazole 40  MG tablet Commonly known as: PROTONIX TAKE 1 TABLET BY MOUTH EVERY DAY   QUEtiapine 50 MG tablet Commonly known as: SEROQUEL TAKE 1 TABLET AT SUPPERTIME & MAY REPEAT AT BEDTIME IF NEEDED FOR SLEEP What changed: See the new instructions.   rosuvastatin 20 MG tablet Commonly known as: CRESTOR Take 1 tablet Daily for Cholesterol What changed:   how much to take  how to take this  when to take this  additional instructions   silver sulfADIAZINE 1 % cream Commonly known as: SILVADENE Apply to affected area Daily and cover with non-stick Telfa pads & secure with paper tape   tamsulosin 0.4 MG Caps capsule Commonly known as: FLOMAX TAKE 1 CAPSULE AT BEDTIME FOR PROSTATE & URINARY FREQUENCY   traMADol-acetaminophen 37.5-325 MG tablet Commonly known as: ULTRACET Take 1 tablet 4 x /day ONLY if Needed for Pain   vitamin B-12 100 MCG tablet Commonly known as: CYANOCOBALAMIN Take 1 tablet (100 mcg total) by mouth daily.   Vitamin D-3 25 MCG (1000 UT) Caps Take 2,000 Units by mouth daily.            Durable Medical Equipment  (From admission, onward)         Start     Ordered    04/26/19 1110  For home use only DME Walker rolling  Once    Question Answer Comment  Walker: With Tea Wheels   Patient needs a walker to treat with the following condition Weakness      04/26/19 1110   04/26/19 1109  For home use only DME 3 n 1  Once     04/26/19 1110         Follow-up Information    Unk Pinto, MD Follow up in 1 week(s).   Specialty: Internal Medicine Contact information: [address] [place] [address] [phone]          Allergies  Allergen Reactions  . Alprazolam Other (See Comments)    OverSedation   . Sulfa Antibiotics     Pt unsure of reaction    Consultations: Neurology and psychiatry   Procedures/Studies: EEG  Result Date: 04/22/2019 Lora Havens, MD     04/22/2019 10:55 AM Patient Name: Lance Ramirez MRN: [id number] Epilepsy Attending: Lora Havens Referring Physician/Provider: Dr. Dana Allan Date: 04/22/2019 Duration: 24.02 minutes Patient history: He has-year-old male with history of prior strokes with no residual deficits who presented with difficulty walking and slurred speech.  MRI brain negative for stroke.  EEG to evaluate for seizures. Level of alertness: Awake, asleep AEDs during EEG study: None Technical aspects: This EEG study was done with scalp electrodes positioned according to the 10-20 International system of electrode placement. Electrical activity was acquired at a sampling rate of 500Hz  and reviewed with a high frequency filter of 70Hz  and a low frequency filter of 1Hz . EEG data were recorded continuously and digitally stored. Description: The posterior dominant rhythm consists of 8 Hz activity of moderate voltage (25-35 uV) seen predominantly in posterior head regions, symmetric and reactive to eye opening and eye closing.  EEG showed intermittent generalized 3-5 Hz theta-delta slowing.  Sleep was characterized by vertex waves, maximal frontocentral. Hyperventilation and photic  stimulation were not performed. Abnormality - Intermittent slow, generalized IMPRESSION: This study is suggestive of mild diffuse encephalopathy, nonspecific to etiology.  No seizures or epileptiform discharges were seen throughout the recording. Lora Havens   CT Code Stroke CTA Head W/WO contrast  Result Date: 04/21/2019  CLINICAL DATA:  Slurred speech and unsteady gait. EXAM: CT ANGIOGRAPHY HEAD AND NECK CT PERFUSION BRAIN TECHNIQUE: Multidetector CT imaging of the head and neck was performed using the standard protocol during bolus administration of intravenous contrast. Multiplanar CT image reconstructions and MIPs were obtained to evaluate the vascular anatomy. Carotid stenosis measurements (when applicable) are obtained utilizing NASCET criteria, using the distal internal carotid diameter as the denominator. Multiphase CT imaging of the brain was performed following IV bolus contrast injection. Subsequent parametric perfusion maps were calculated using RAPID software. CONTRAST:  141mL OMNIPAQUE IOHEXOL 350 MG/ML SOLN COMPARISON:  Head CT earlier same day. FINDINGS: CTA NECK FINDINGS Aortic arch: Extensive aortic atherosclerosis. No aneurysm or dissection. Branching pattern is normal without flow limiting origin stenosis. Moderate stenosis of the proximal left subclavian artery. Right carotid system: Common carotid artery is widely patent to the bifurcation. Calcified plaque at the ICA bulb but no stenosis. Cervical ICA is tortuous but widely patent. Left carotid system: Common carotid artery widely patent to the bifurcation. Calcified plaque at the carotid bifurcation but no stenosis. Cervical ICA is tortuous but widely patent. Vertebral arteries: Right vertebral artery origin is widely patent. Calcified plaque at the left vertebral artery origin but with stenosis of no more than 50%. The left vertebral artery shows flow through the cervical region to the foramen magnum. The right vertebral artery  shows proximal flow but slow and diminished flow distally consistent with distal vertebral artery thrombosis. See below. Skeleton: Ankylosis of the cervical spine.  Previous ACDF C3-4. Other neck: No soft tissue mass or lymphadenopathy. Upper chest: Left pleural effusion layering dependently. Otherwise negative. Review of the MIP images confirms the above findings CTA HEAD FINDINGS Anterior circulation: Both internal carotid arteries are patent through the skull base and siphon regions. There is ordinary siphon atherosclerotic calcification with stenosis estimated at 50-70% on both sides, particularly in the supraclinoid internal carotid arteries. On the right, there is focal calcification at the ICA bifurcation but without stenosis greater than 50%. No large or medium vessel occlusion is identified. Distal intracranial vessels show atherosclerotic narrowing and irregularity. On the left, there is a 50-75% stenosis of the M1 segment. Posterior circulation: As noted below, the right vertebral artery is probably acutely occluded distally. The left vertebral artery shows irregular calcified plaque in the V4 segment, possibly flow limiting. Flow from the left vertebral artery does reach the basilar. There is narrowing and irregularity of the proximal basilar artery, with stenosis greater than 75%. Beyond that, the basilar shows moderate narrowing and irregularity. Superior cerebellar arteries show flow. Very diminutive left PCA shows a small amount of flow. Distal left PCA branches not well seen. Venous sinuses: Patent and normal. Anatomic variants: None significant. Review of the MIP images confirms the above findings CT Brain Perfusion Findings: ASPECTS: 10 CBF (<30%) Volume: 42mL Perfusion (Tmax>6.0s) volume: 51mL Mismatch Volume: 2mL Infarction Location:No discernible infarction. Possible T-max prolongation in the left occipital lobe which could be consistent with the left PCA disease described above. Small focus  also seen at the frontal brain and right temporal lobe region probably artifactual. IMPRESSION: Aortic atherosclerosis. 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 No significant carotid bifurcation disease. Atherosclerotic change in the carotid siphon regions with stenosis estimated at 50-70% on both sides. Severe and diffuse intracranial anterior circulation atherosclerotic disease. Vessels are diffusely narrowed. Focal 50% stenosis of the left M1 segment. Right A1 segment severely diseased. Right vertebral artery is probably acutely occluded distally. Left vertebral artery shows  a 50% stenosis at its origin. There is severe atherosclerotic disease in the V4 segment with severe stenosis. Flow does reach the basilar. 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 Atherosclerotic change more diffusely affecting the basilar artery. Diminished flow in the PCA regions, particularly on the left. Very diminutive left PCA with minimal flow. Perfusion imaging does not show any definite completed infarction. Question of prolonged T-max in the left occipital lobe which could go along with the left PCA disease. Case discussed with Dr. Rory Percy during interpretation. Electronically Signed   By: Nelson Chimes M.D.   On: 04/21/2019 21:56   CT Code Stroke CTA Neck W/WO contrast  Result Date: 04/21/2019 CLINICAL DATA:  Slurred speech and unsteady gait. EXAM: CT ANGIOGRAPHY HEAD AND NECK CT PERFUSION BRAIN TECHNIQUE: Multidetector CT imaging of the head and neck was performed using the standard protocol during bolus administration of intravenous contrast. Multiplanar CT image reconstructions and MIPs were obtained to evaluate the vascular anatomy. Carotid stenosis measurements (when applicable) are obtained utilizing NASCET criteria, using the distal internal carotid diameter as the denominator. Multiphase CT imaging of the brain was performed following IV bolus contrast injection. Subsequent parametric  perfusion maps were calculated using RAPID software. CONTRAST:  157mL OMNIPAQUE IOHEXOL 350 MG/ML SOLN COMPARISON:  Head CT earlier same day. FINDINGS: CTA NECK FINDINGS Aortic arch: Extensive aortic atherosclerosis. No aneurysm or dissection. Branching pattern is normal without flow limiting origin stenosis. Moderate stenosis of the proximal left subclavian artery. Right carotid system: Common carotid artery is widely patent to the bifurcation. Calcified plaque at the ICA bulb but no stenosis. Cervical ICA is tortuous but widely patent. Left carotid system: Common carotid artery widely patent to the bifurcation. Calcified plaque at the carotid bifurcation but no stenosis. Cervical ICA is tortuous but widely patent. Vertebral arteries: Right vertebral artery origin is widely patent. Calcified plaque at the left vertebral artery origin but with stenosis of no more than 50%. The left vertebral artery shows flow through the cervical region to the foramen magnum. The right vertebral artery shows proximal flow but slow and diminished flow distally consistent with distal vertebral artery thrombosis. See below. Skeleton: Ankylosis of the cervical spine.  Previous ACDF C3-4. Other neck: No soft tissue mass or lymphadenopathy. Upper chest: Left pleural effusion layering dependently. Otherwise negative. Review of the MIP images confirms the above findings CTA HEAD FINDINGS Anterior circulation: Both internal carotid arteries are patent through the skull base and siphon regions. There is ordinary siphon atherosclerotic calcification with stenosis estimated at 50-70% on both sides, particularly in the supraclinoid internal carotid arteries. On the right, there is focal calcification at the ICA bifurcation but without stenosis greater than 50%. No large or medium vessel occlusion is identified. Distal intracranial vessels show atherosclerotic narrowing and irregularity. On the left, there is a 50-75% stenosis of the M1 segment.  Posterior circulation: As noted below, the right vertebral artery is probably acutely occluded distally. The left vertebral artery shows irregular calcified plaque in the V4 segment, possibly flow limiting. Flow from the left vertebral artery does reach the basilar. There is narrowing and irregularity of the proximal basilar artery, with stenosis greater than 75%. Beyond that, the basilar shows moderate narrowing and irregularity. Superior cerebellar arteries show flow. Very diminutive left PCA shows a small amount of flow. Distal left PCA branches not well seen. Venous sinuses: Patent and normal. Anatomic variants: None significant. Review of the MIP images confirms the above findings CT Brain Perfusion Findings:  ASPECTS: 10 CBF (<30%) Volume: 56mL Perfusion (Tmax>6.0s) volume: 63mL Mismatch Volume: 60mL Infarction Location:No discernible infarction. Possible T-max prolongation in the left occipital lobe which could be consistent with the left PCA disease described above. Small focus also seen at the frontal brain and right temporal lobe region probably artifactual. IMPRESSION: Aortic atherosclerosis. 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 No significant carotid bifurcation disease. Atherosclerotic change in the carotid siphon regions with stenosis estimated at 50-70% on both sides. Severe and diffuse intracranial anterior circulation atherosclerotic disease. Vessels are diffusely narrowed. Focal 50% stenosis of the left M1 segment. Right A1 segment severely diseased. Right vertebral artery is probably acutely occluded distally. Left vertebral artery shows a 50% stenosis at its origin. There is severe atherosclerotic disease in the V4 segment with severe stenosis. Flow does reach the basilar. 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 Atherosclerotic change more diffusely affecting the basilar artery. Diminished flow in the PCA regions, particularly on the left. Very diminutive  left PCA with minimal flow. Perfusion imaging does not show any definite completed infarction. Question of prolonged T-max in the left occipital lobe which could go along with the left PCA disease. Case discussed with Dr. Rory Percy during interpretation. Electronically Signed   By: Nelson Chimes M.D.   On: 04/21/2019 21:56   MR BRAIN WO CONTRAST  Result Date: 04/21/2019 CLINICAL DATA:  Encephalopathy. Gait disturbance. Speech disturbance. Intermittent consciousness. EXAM: MRI HEAD WITHOUT CONTRAST TECHNIQUE: Multiplanar, multiecho pulse sequences of the brain and surrounding structures were obtained without intravenous contrast. COMPARISON:  CT studies same day FINDINGS: Brain: Diffusion imaging does not show any acute or subacute infarction at this time. There are a few old small vessel cerebellar infarctions on the right. Minimal small vessel change affects the pons. Cerebral hemispheres show old lacunar infarctions in the thalami and in the right basal ganglia. There chronic small-vessel ischemic changes affecting the cerebral hemispheric deep and subcortical white matter. There is an old right occipital cortical and subcortical infarction. No mass lesion, hydrocephalus or extra-axial collection. There are a few small foci of hemosiderin deposition associated with some of the old small vessel infarctions. Vascular: Abnormal flow in the right vertebral artery as shown on CT. Other major vessels show flow. Skull and upper cervical spine: Negative Sinuses/Orbits: Clear/normal Other: None IMPRESSION: No acute infarction evident yet by MRI. Extensive chronic small-vessel ischemic changes throughout the brain as outlined above. Old right occipital cortical and subcortical infarction. Abnormal flow in the right vertebral artery. Electronically Signed   By: Nelson Chimes M.D.   On: 04/21/2019 22:39   CT Code Stroke Cerebral Perfusion with contrast  Result Date: 04/21/2019 CLINICAL DATA:  Slurred speech and unsteady  gait. EXAM: CT ANGIOGRAPHY HEAD AND NECK CT PERFUSION BRAIN TECHNIQUE: Multidetector CT imaging of the head and neck was performed using the standard protocol during bolus administration of intravenous contrast. Multiplanar CT image reconstructions and MIPs were obtained to evaluate the vascular anatomy. Carotid stenosis measurements (when applicable) are obtained utilizing NASCET criteria, using the distal internal carotid diameter as the denominator. Multiphase CT imaging of the brain was performed following IV bolus contrast injection. Subsequent parametric perfusion maps were calculated using RAPID software. CONTRAST:  167mL OMNIPAQUE IOHEXOL 350 MG/ML SOLN COMPARISON:  Head CT earlier same day. FINDINGS: CTA NECK FINDINGS Aortic arch: Extensive aortic atherosclerosis. No aneurysm or dissection. Branching pattern is normal without flow limiting origin stenosis. Moderate stenosis of the proximal left subclavian artery. Right carotid system: Common  carotid artery is widely patent to the bifurcation. Calcified plaque at the ICA bulb but no stenosis. Cervical ICA is tortuous but widely patent. Left carotid system: Common carotid artery widely patent to the bifurcation. Calcified plaque at the carotid bifurcation but no stenosis. Cervical ICA is tortuous but widely patent. Vertebral arteries: Right vertebral artery origin is widely patent. Calcified plaque at the left vertebral artery origin but with stenosis of no more than 50%. The left vertebral artery shows flow through the cervical region to the foramen magnum. The right vertebral artery shows proximal flow but slow and diminished flow distally consistent with distal vertebral artery thrombosis. See below. Skeleton: Ankylosis of the cervical spine.  Previous ACDF C3-4. Other neck: No soft tissue mass or lymphadenopathy. Upper chest: Left pleural effusion layering dependently. Otherwise negative. Review of the MIP images confirms the above findings CTA HEAD  FINDINGS Anterior circulation: Both internal carotid arteries are patent through the skull base and siphon regions. There is ordinary siphon atherosclerotic calcification with stenosis estimated at 50-70% on both sides, particularly in the supraclinoid internal carotid arteries. On the right, there is focal calcification at the ICA bifurcation but without stenosis greater than 50%. No large or medium vessel occlusion is identified. Distal intracranial vessels show atherosclerotic narrowing and irregularity. On the left, there is a 50-75% stenosis of the M1 segment. Posterior circulation: As noted below, the right vertebral artery is probably acutely occluded distally. The left vertebral artery shows irregular calcified plaque in the V4 segment, possibly flow limiting. Flow from the left vertebral artery does reach the basilar. There is narrowing and irregularity of the proximal basilar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    Collection Time: 04/22/19  4:13 PM   Specimen: BLOOD LEFT HAND  Result Value Ref Range Status   Specimen Description BLOOD LEFT HAND  Final   Special Requests   Final    BOTTLES DRAWN AEROBIC ONLY Blood Culture adequate volume   Culture   Final    NO GROWTH 4 DAYS Performed at [facility], [place] [address], [place], [place] [address]    Report Status PENDING  Incomplete  Culture, blood (routine x 2)     Status: None (Preliminary result)   Collection Time: 04/22/19  4:13 PM   Specimen: BLOOD  Result Value Ref Range Status   Specimen Description BLOOD RIGHT ANTECUBITAL  Final   Special Requests   Final    BOTTLES DRAWN AEROBIC ONLY Blood Culture adequate volume   Culture   Final    NO GROWTH 4 DAYS Performed at [facility], [place] [address] [place], [place] [address]    Report Status PENDING  Incomplete     Time coordinating discharge: Over 30 minutes  SIGNED:   Darliss Cheney, MD  [facility] 04/26/2019, 11:42 AM  If 7PM-7AM, please contact night-coverage [email or web address]  ADDENDUM: Patient failed voiding trial. Reportedly he had voided/dribbled 3 times with total output of only 175 ml and bladder scan shows 350 ml. D/w dtr and she would like for him to have foley cathter and follow with urology as OP. Patient is being discharged with foley catheter.

## 2019-04-26 NOTE — Discharge Instructions (Signed)
Dementia Caregiver Guide Dementia is a term used to describe a number of symptoms that affect memory and thinking. The most common symptoms include:  Memory loss.  Trouble with language and communication.  Trouble concentrating.  Poor judgment.  Problems with reasoning.  Child-like behavior and language.  Extreme anxiety.  Angry outbursts.  Wandering from home or public places. Dementia usually gets worse slowly over time. In the early stages, people with dementia can stay independent and safe with some help. In later stages, they need help with daily tasks such as dressing, grooming, and using the bathroom. How to help the person with dementia cope Dementia can be frightening and confusing. Here are some tips to help the person with dementia cope with changes caused by the disease. General tips  Keep the person on track with his or her routine.  Try to identify areas where the person may need help.  Be supportive, patient, calm, and encouraging.  Gently remind the person that adjusting to changes takes time.  Help with the tasks that the person has asked for help with.  Keep the person involved in daily tasks and decisions as much as possible.  Encourage conversation, but try not to get frustrated or harried if the person struggles to find words or does not seem to appreciate your help. Communication tips  When the person is talking or seems frustrated, make eye contact and hold the person's hand.  Ask specific questions that need yes or no answers.  Use simple words, short sentences, and a calm voice. Only give one direction at a time.  When offering choices, limit them to just 1 or 2.  Avoid correcting the person in a negative way.  If the person is struggling to find the right words, gently try to help him or her. How to recognize symptoms of stress Symptoms of stress in caregivers include:  Feeling frustrated or angry with the person with  dementia.  Denying that the person has dementia or that his or her symptoms will not improve.  Feeling hopeless and unappreciated.  Difficulty sleeping.  Difficulty concentrating.  Feeling anxious, irritable, or depressed.  Developing stress-related health problems.  Feeling like you have too little time for your own life. Follow these instructions at home:   Make sure that you and the person you are caring for: ? Get regular sleep. ? Exercise regularly. ? Eat regular, nutritious meals. ? Drink enough fluid to keep your urine clear or pale yellow. ? Take over-the-counter and prescription medicines only as told by your health care providers. ? Attend all scheduled health care appointments.  Join a support group with others who are caregivers.  Ask about respite care resources so that you can have a regular break from the stress of caregiving.  Look for signs of stress in yourself and in the person you are caring for. If you notice signs of stress, take steps to manage it.  Consider any safety risks and take steps to avoid them.  Organize medications in a pill box for each day of the week.  Create a plan to handle any legal or financial matters. Get legal or financial advice if needed.  Keep a calendar in a central location to remind the person of appointments or other activities. Tips for reducing the risk of injury  Keep floors clear of clutter. Remove rugs, magazine racks, and floor lamps.  Keep hallways well lit, especially at night.  Put a handrail and nonslip mat in the bathtub or   shower.  Put childproof locks on cabinets that contain dangerous items, such as medicines, alcohol, guns, toxic cleaning items, sharp tools or utensils, matches, and lighters.  Put the locks in places where the person cannot see or reach them easily. This will help ensure that the person does not wander out of the house and get lost.  Be prepared for emergencies. Keep a list of  emergency phone numbers and addresses in a convenient area.  Remove car keys and lock garage doors so that the person does not try to get in the car and drive.  Have the person wear a bracelet that tracks locations and identifies the person as having memory problems. This should be worn at all times for safety. Where to find support: Many individuals and organizations offer support. These include:  Support groups for people with dementia and for caregivers.  Counselors or therapists.  Home health care services.  Adult day care centers. Where to find more information Alzheimer's Association: www.alz.org Contact a health care provider if:  The person's health is rapidly getting worse.  You are no longer able to care for the person.  Caring for the person is affecting your physical and emotional health.  The person threatens himself or herself, you, or anyone else. Summary  Dementia is a term used to describe a number of symptoms that affect memory and thinking.  Dementia usually gets worse slowly over time.  Take steps to reduce the person's risk of injury, and to plan for future care.  Caregivers need support, relief from caregiving, and time for their own lives. This information is not intended to replace advice given to you by your health care provider. Make sure you discuss any questions you have with your health care provider. Document Revised: 02/06/2017 Document Reviewed: 01/29/2016 Elsevier Patient Education  2020 Elsevier Inc.  

## 2019-04-26 NOTE — TOC Initial Note (Signed)
Transition of Care [facility]) - Initial/Assessment Note    Patient Details  Name: Lance Ramirez MRN: [id number] Date of Birth: [birth year]  [facility] CM/SW Contact:    Pollie Friar, RN Phone Number: 04/26/2019, 12:13 PM  Clinical Narrative:                 TOC met with patient and went over d/c information. Pt states his son is going to move in with him. TOC asked about speaking to family and he was in agreement.  CM called pts daughter and she states pt has caregivers during the day and family stays at night. Son will not be moving in per daughter.  TOC inquired about [place] services after providing choice. Daughter selected [facility] but they were unable to accept. [facility] accepted and daughter in agreement. Information on the AVS. Walker to be delivered to the room per [facility]. Son to provide transport home when medically ready.   Expected Discharge Plan: [place] Barriers to Discharge: No Barriers Identified   Patient Goals and CMS Choice   CMS Medicare.gov Compare Post Acute Care list provided to:: Patient Represenative (must comment) Choice offered to / list presented to : Adult Children  Expected Discharge Plan and Services Expected Discharge Plan: [place]   Discharge Planning Services: CM Consult Post Acute Care Choice: Home Health, Durable Medical Equipment Living arrangements for the past 2 months: Single Family Home Expected Discharge Date: 04/26/19               DME Arranged: Gilford Rile rolling DME Agency: [facility] Date DME Agency Contacted: 04/26/19 Time DME Agency Contacted: 1209 Representative spoke with at DME Agency: Andree Coss HH Arranged: PT, OT, Speech Therapy [place] Agency: [facility] Date [place]: 04/26/19   Representative spoke with at [place]: [place]  Prior Living Arrangements/Services Living arrangements for the past 2 months: [place] with:: Self Patient  language and need for interpreter reviewed:: Yes Do you feel safe going back to the place where you live?: Yes      Need for Family Participation in Patient Care: Yes (Comment) Care giver support system in place?: Yes (comment)(son ,daughter, caregiver) Current home services: Homehealth aide, DME(rollator, 3 in 1) Criminal Activity/Legal Involvement Pertinent to Current Situation/Hospitalization: No - Comment as needed  Activities of Daily Living      Permission Sought/Granted Permission sought to share information with : Family Supports Permission granted to share information with : Yes, Verbal Permission Granted        Permission granted to share info w Relationship: son and daughter     Emotional Assessment Appearance:: Appears stated age Attitude/Demeanor/Rapport: Engaged Affect (typically observed): Pleasant Orientation: : Oriented to Self, Oriented to Place   Psych Involvement: No (comment)  Admission diagnosis:  Encephalopathy [G93.40] Unresponsiveness [J49.70] Acute metabolic encephalopathy [Y63.78] Patient Active Problem List   Diagnosis Date Noted  . Dementia with behavioral disturbance ([place]) 04/23/2019  . Unresponsiveness 04/22/2019  . Acute metabolic encephalopathy 58/85/0277  . Alprazolam Oversedation 11/29/2018  . Abnormal glucose 05/09/2018  . Overweight (BMI 25.0-29.9) 12/09/2017  . Aortic atherosclerosis ([place]) 07/18/2017  . CKD (chronic kidney disease) stage 3, GFR 30-59 ml/min 02/08/2017  . Moderate dementia ([place]) 06/14/2015  . Benign prostatic hyperplasia with lower urinary tract symptoms 03/09/2015  . At high risk for falls 12/08/2014  . Depression, major, recurrent, in partial remission ([place]) 05/26/2014  . Medication management 05/31/2013  . GERD (  gastroesophageal reflux disease)   . Vitamin D deficiency   . History of multiple cerebrovascular accidents (CVAs) 05/14/2012  . Essential hypertension 02/10/2011  . Hyperlipidemia, mixed 02/10/2011    PCP:  Unk Pinto, MD Pharmacy:   [id number] [place]Phone: [phone]: [phone]    Social Determinants of Health (SDOH) Interventions    Readmission Risk Interventions No flowsheet data found.

## 2019-04-26 NOTE — TOC Transition Note (Signed)
Transition of Care [facility]) - CM/SW Discharge Note   Patient Details  Name: Lance Ramirez MRN: [id number] Date of Birth: [birth year]  [facility] CM/SW Contact:  Pollie Friar, RN Phone Number: 04/26/2019, 2:16 PM   Clinical Narrative:    Pt discharging home with family. HH arranged through [facility].  DME at the bedside.  Son to transport home.   Final next level of care: Home w Home Health Services Barriers to Discharge: No Barriers Identified   Patient Goals and CMS Choice   CMS Medicare.gov Compare Post Acute Care list provided to:: Patient Represenative (must comment) Choice offered to / list presented to : Adult Children  Discharge Placement                       Discharge Plan and Services   Discharge Planning Services: CM Consult Post Acute Care Choice: Home Health, Durable Medical Equipment          DME Arranged: Walker rolling DME Agency: [facility] Date DME Agency Contacted: 04/26/19 Time DME Agency Contacted: 1209 Representative spoke with at DME Agency: [place]: PT, OT, Speech Therapy HH Agency: [facility] Date [place]: 04/26/19   Representative spoke with at [place]: [place]) Interventions     Readmission Risk Interventions No flowsheet data found.

## 2019-04-26 NOTE — Progress Notes (Signed)
Physical Therapy Treatment Patient Details Name: Lance Ramirez MRN: [id number] DOB: 1933-05-22 Today's Date: 04/26/2019    History of Present Illness 86 yo admitted with confusion, garbled speech and metabolic encephalopathy. Wife recently passed away. PMhx: dementia, CVA, CKD, CHF, HTN, BPH, HLD    PT Comments    Pt performed gt training and functional mobility with min to minguard and assistance and max VCs for safety.  He is appropriate for d/c home if 24 hr assistance can be provided.  Pt will require HHPT and DME listed below.      Follow Up Recommendations  Home health PT;Supervision/Assistance - 24 hour     Equipment Recommendations  Rolling walker with 5" wheels;3in1 (PT)    Recommendations for Other Services       Precautions / Restrictions Precautions Precautions: Fall Restrictions Weight Bearing Restrictions: No    Mobility  Bed Mobility Overal bed mobility: Needs Assistance Bed Mobility: Supine to Sit;Sit to Supine     Supine to sit: Supervision Sit to supine: Supervision   General bed mobility comments: No physical assist needed   Transfers Overall transfer level: Needs assistance Equipment used: Rolling walker (2 wheeled) Transfers: Sit to/from Stand Sit to Stand: Min guard Stand pivot transfers: Min guard       General transfer comment: Cues for hand placement to and from seated surface.  Pt required multiple cues as he attempted to pull on RW for support.  Ambulation/Gait Ambulation/Gait assistance: Min guard Gait Distance (Feet): 320 Feet Assistive device: Rolling walker (2 wheeled) Gait Pattern/deviations: Step-through pattern;Decreased stride length;Trunk flexed     General Gait Details: Pt required elevation of RW height.  Cues for upper trunk control and RW safety.   Stairs             Wheelchair Mobility    Modified Rankin (Stroke Patients Only)       Balance Overall balance assessment: Needs assistance   Sitting  balance-Leahy Scale: Fair       Standing balance-Leahy Scale: Fair                              Cognition Arousal/Alertness: Awake/alert Behavior During Therapy: WFL for tasks assessed/performed Overall Cognitive Status: No family/caregiver present to determine baseline cognitive functioning                                 General Comments: Pt much better cognitively this session- he was alert and oriented once awoken. Pt required increased time for sequencing but was otherwise able to follow all directions with min verbal cueing and demonstrated no impulsivity this session      Exercises      General Comments        Pertinent Vitals/Pain Pain Assessment: No/denies pain Faces Pain Scale: Hurts a little bit Pain Location: throat while swallowing Pain Descriptors / Indicators: Discomfort Pain Intervention(s): Monitored during session;Repositioned    Home Living                      Prior Function            PT Goals (current goals can now be found in the care plan section) Acute Rehab PT Goals Patient Stated Goal: None stated Potential to Achieve Goals: Fair Progress towards PT goals: Progressing toward goals    Frequency    Min 3X/week  PT Plan Current plan remains appropriate    Co-evaluation              AM-PAC PT "6 Clicks" Mobility   Outcome Measure  Help needed turning from your back to your side while in a flat bed without using bedrails?: A Little Help needed moving from lying on your back to sitting on the side of a flat bed without using bedrails?: A Little Help needed moving to and from a bed to a chair (including a wheelchair)?: A Little Help needed standing up from a chair using your arms (e.g., wheelchair or bedside chair)?: A Little Help needed to walk in hospital room?: A Little Help needed climbing 3-5 steps with a railing? : A Little 6 Click Score: 18    End of Session Equipment Utilized  During Treatment: Gait belt Activity Tolerance: Patient tolerated treatment well Patient left: in bed;with call bell/phone within reach;with bed alarm set Nurse Communication: Mobility status PT Visit Diagnosis: Other abnormalities of gait and mobility (R26.89);Difficulty in walking, not elsewhere classified (R26.2)     Time: [id number] PT Time Calculation (min) (ACUTE ONLY): 23 min  Charges:  $Gait Training: 8-22 mins $Therapeutic Activity: 8-22 mins                     Erasmo Leventhal , PTA Acute Rehabilitation Services Pager [phone] Office [phone]     Darien Mignogna Eli Hose 04/26/2019, 12:30 PM

## 2019-04-27 ENCOUNTER — Inpatient Hospital Stay (HOSPITAL_COMMUNITY)
Admission: EM | Admit: 2019-04-27 | Discharge: 2019-04-27 | DRG: 071 | Disposition: A | Discharge status: Hospice | Payer: PPO | Attending: Internal Medicine | Admitting: Internal Medicine

## 2019-04-27 ENCOUNTER — Encounter (HOSPITAL_COMMUNITY): Payer: Self-pay

## 2019-04-27 ENCOUNTER — Telehealth: Payer: Self-pay | Admitting: *Deleted

## 2019-04-27 DIAGNOSIS — I130 Hypertensive heart and chronic kidney disease with heart failure and stage 1 through stage 4 chronic kidney disease, or unspecified chronic kidney disease: Secondary | ICD-10-CM | POA: Diagnosis present

## 2019-04-27 DIAGNOSIS — R4020 Unspecified coma: Secondary | ICD-10-CM | POA: Diagnosis not present

## 2019-04-27 DIAGNOSIS — M1990 Unspecified osteoarthritis, unspecified site: Secondary | ICD-10-CM | POA: Diagnosis present

## 2019-04-27 DIAGNOSIS — E785 Hyperlipidemia, unspecified: Secondary | ICD-10-CM | POA: Diagnosis not present

## 2019-04-27 DIAGNOSIS — Z87891 Personal history of nicotine dependence: Secondary | ICD-10-CM | POA: Diagnosis not present

## 2019-04-27 DIAGNOSIS — E291 Testicular hypofunction: Secondary | ICD-10-CM | POA: Diagnosis not present

## 2019-04-27 DIAGNOSIS — G9341 Metabolic encephalopathy: Principal | ICD-10-CM | POA: Diagnosis present

## 2019-04-27 DIAGNOSIS — R402431 Glasgow coma scale score 3-8, in the field [EMT or ambulance]: Secondary | ICD-10-CM | POA: Diagnosis not present

## 2019-04-27 DIAGNOSIS — Z8673 Personal history of transient ischemic attack (TIA), and cerebral infarction without residual deficits: Secondary | ICD-10-CM | POA: Diagnosis not present

## 2019-04-27 DIAGNOSIS — F329 Major depressive disorder, single episode, unspecified: Secondary | ICD-10-CM | POA: Diagnosis present

## 2019-04-27 DIAGNOSIS — F0390 Unspecified dementia without behavioral disturbance: Secondary | ICD-10-CM | POA: Diagnosis present

## 2019-04-27 DIAGNOSIS — R404 Transient alteration of awareness: Secondary | ICD-10-CM | POA: Diagnosis not present

## 2019-04-27 DIAGNOSIS — K219 Gastro-esophageal reflux disease without esophagitis: Secondary | ICD-10-CM | POA: Diagnosis present

## 2019-04-27 DIAGNOSIS — Z888 Allergy status to other drugs, medicaments and biological substances status: Secondary | ICD-10-CM | POA: Diagnosis not present

## 2019-04-27 DIAGNOSIS — N400 Enlarged prostate without lower urinary tract symptoms: Secondary | ICD-10-CM | POA: Diagnosis present

## 2019-04-27 DIAGNOSIS — I444 Left anterior fascicular block: Secondary | ICD-10-CM | POA: Diagnosis not present

## 2019-04-27 DIAGNOSIS — Z7902 Long term (current) use of antithrombotics/antiplatelets: Secondary | ICD-10-CM

## 2019-04-27 DIAGNOSIS — Z79899 Other long term (current) drug therapy: Secondary | ICD-10-CM | POA: Diagnosis not present

## 2019-04-27 DIAGNOSIS — Z515 Encounter for palliative care: Secondary | ICD-10-CM | POA: Diagnosis not present

## 2019-04-27 DIAGNOSIS — G9340 Encephalopathy, unspecified: Secondary | ICD-10-CM | POA: Diagnosis not present

## 2019-04-27 DIAGNOSIS — Z882 Allergy status to sulfonamides status: Secondary | ICD-10-CM | POA: Diagnosis not present

## 2019-04-27 DIAGNOSIS — E559 Vitamin D deficiency, unspecified: Secondary | ICD-10-CM | POA: Diagnosis not present

## 2019-04-27 DIAGNOSIS — Z7982 Long term (current) use of aspirin: Secondary | ICD-10-CM

## 2019-04-27 DIAGNOSIS — I5032 Chronic diastolic (congestive) heart failure: Secondary | ICD-10-CM | POA: Diagnosis present

## 2019-04-27 DIAGNOSIS — R000 Tachycardia, unspecified: Secondary | ICD-10-CM | POA: Diagnosis present

## 2019-04-27 DIAGNOSIS — N184 Chronic kidney disease, stage 4 (severe): Secondary | ICD-10-CM | POA: Diagnosis present

## 2019-04-27 DIAGNOSIS — R52 Pain, unspecified: Secondary | ICD-10-CM | POA: Diagnosis not present

## 2019-04-27 LAB — CULTURE, BLOOD (ROUTINE X 2)
Culture: NO GROWTH
Culture: NO GROWTH
Special Requests: ADEQUATE
Special Requests: ADEQUATE

## 2019-04-27 LAB — CBG MONITORING, ED: Glucose-Capillary: 144 mg/dL — ABNORMAL HIGH (ref 70–99)

## 2019-04-27 LAB — VITAMIN B1: Vitamin B1 (Thiamine): 117 nmol/L (ref 66.5–200.0)

## 2019-04-27 MED ORDER — DIAZEPAM 2.5 MG RE GEL: 2.5000 mg | RECTAL | 0 refills | Status: DC | PRN

## 2019-04-27 MED ORDER — MORPHINE SULFATE 20 MG/5ML PO SOLN: 10.0000 mg | ORAL | 0 refills | Status: DC | PRN

## 2019-04-27 MED ORDER — LORAZEPAM 2 MG/ML PO CONC: 1.0000 mg | ORAL | 0 refills | Status: DC | PRN

## 2019-04-27 MED ORDER — MORPHINE SULFATE 20 MG/5ML PO SOLN: 10.0000 mg | ORAL | 0 refills | Status: AC | PRN

## 2019-04-27 MED ORDER — MORPHINE SULFATE (PF) 4 MG/ML IV SOLN
4.0000 mg | Freq: Once | INTRAVENOUS | Status: AC
  Administered 2019-04-27: 4 mg via INTRAVENOUS
  Filled 2019-04-27: qty 1

## 2019-04-27 MED ORDER — DIAZEPAM 2.5 MG RE GEL: 2.5000 mg | RECTAL | 0 refills | Status: AC | PRN

## 2019-04-27 MED ORDER — LORAZEPAM 2 MG/ML PO CONC: 1.0000 mg | ORAL | 0 refills | Status: AC | PRN

## 2019-04-27 MED ORDER — LORAZEPAM 2 MG/ML IJ SOLN
0.5000 mg | Freq: Once | INTRAMUSCULAR | Status: AC
  Administered 2019-04-27: 0.5 mg via INTRAVENOUS
  Filled 2019-04-27: qty 1

## 2019-04-27 NOTE — Discharge Planning (Signed)
Mandy Peeks J. Clydene Laming, RN, BSN, [employer] [phone] Spoke with pt at bedside regarding discharge planning for Home Hospice. Offered pt list of home health agencies to choose from.  Pt daughter chose [facility] to render services. Glyn Ade of [facility] notified.

## 2019-04-27 NOTE — Discharge Instructions (Addendum)
You are being discharged to home and home hospice will meet you there when you get there.  We will keep the IV in your arm so that they can give medications as needed to keep you comfortable.  You are being discharged with home hospice  You may give the morphine every 2 hours as needed, Ativan every 4 hours as needed, give very small amounts into the cheek and allow him to swallow.

## 2019-04-27 NOTE — Telephone Encounter (Signed)
Called patient on 04/27/2019 , 12:20 PM in an attempt to reach the patient for a hospital follow up.  Spoke with the patient's daughter, Anderson [place], regarding the patient's hospital follow up visit. Admit date: 04/21/19 Discharge: 04/26/19   He does not have any questions or concerns about medications from the hospital admission. The patient's medications were reviewed over the phone, they were counseled to bring in all current medications to the hospital follow up visit.   I advised the patient to call if any questions or concerns arise about the hospital admission or medications    Home health was started in the hospital. The daughter has received a call from the in home speech therapist and has an appointment tomorrow.  No other home health provider has called. All questions were answered and a follow up appointment was made. The daughter will call back to schedule a hospital follow up visit between 05/04/2019 and 05/11/2019.  Prior to Admission medications   Medication Sig Start Date End Date Taking? Authorizing Provider  amLODipine (NORVASC) 5 MG tablet Take 1 tablet (5 mg total) by mouth daily. 04/26/19 05/26/19  Darliss Cheney, MD  aspirin EC 81 MG tablet Take 81 mg by mouth daily.    [provider]  bisoprolol-hydrochlorothiazide (ZIAC) 10-6.25 MG tablet Take 1 tablet Daily for BP Patient taking differently: Take 1 tablet by mouth daily.  11/03/18   Unk Pinto, MD  buPROPion (WELLBUTRIN XL) 300 MG 24 hr tablet Take 1 tablet every morning for Mood Patient taking differently: Take 300 mg by mouth daily.  02/21/18   Unk Pinto, MD  Cholecalciferol (VITAMIN D-3) 25 MCG (1000 UT) CAPS Take 2,000 Units by mouth daily.     [provider]  citalopram (CELEXA) 40 MG tablet Take 1 tablet Daily for Mood Patient taking differently: Take 40 mg by mouth daily.  02/23/19   Unk Pinto, MD  clopidogrel (PLAVIX) 75 MG tablet TAKE 1 TABLET DAILY TO PREVENT STROKE Patient  taking differently: Take 75 mg by mouth daily.  01/09/19   Unk Pinto, MD  doxazosin (CARDURA) 8 MG tablet Take 1 tablet at Bedtime for BP Patient taking differently: Take 8 mg by mouth at bedtime.  04/21/19   Unk Pinto, MD  finasteride (PROSCAR) 5 MG tablet Take 1 tablet Daily for Prostate Patient taking differently: Take 5 mg by mouth daily.  12/01/18   Unk Pinto, MD  Melatonin 3 MG TABS Take 3 mg by mouth at bedtime.    [provider]  minoxidil (LONITEN) 10 MG tablet TAKE 1 TABLET DAILY FOR BLOOD PRESSURE Patient taking differently: Take 10 mg by mouth daily.  02/28/19   Unk Pinto, MD  olmesartan (BENICAR) 40 MG tablet Take 1/2 to 1 tablet Daily  for BP Patient taking differently: Take 20-40 mg by mouth See admin instructions. Take 1/2 tablet (20mg ) to 1 tablet (40mg ) by mouth daily depending on blood pressure reading 04/20/19   Unk Pinto, MD  pantoprazole (PROTONIX) 40 MG tablet TAKE 1 TABLET BY MOUTH EVERY DAY Patient taking differently: Take 40 mg by mouth daily.  05/18/18   Unk Pinto, MD  QUEtiapine (SEROQUEL) 50 MG tablet TAKE 1 TABLET AT SUPPERTIME & MAY REPEAT AT BEDTIME IF NEEDED FOR SLEEP Patient taking differently: Take 25 mg by mouth at bedtime.  03/18/19   Liane Comber, NP  rosuvastatin (CRESTOR) 20 MG tablet Take 1 tablet Daily for Cholesterol Patient taking differently: Take 20 mg by mouth daily.  11/03/18   Unk Pinto,  MD  silver sulfADIAZINE (SILVADENE) 1 % cream Apply to affected area Daily and cover with non-stick Telfa pads & secure with paper tape Patient not taking: Reported on 04/22/2019 03/21/19 03/20/20  Unk Pinto, MD  tamsulosin (FLOMAX) 0.4 MG CAPS capsule TAKE 1 CAPSULE AT BEDTIME FOR PROSTATE & URINARY FREQUENCY Patient not taking: Reported on 04/22/2019 09/29/18   Unk Pinto, MD  traMADol-acetaminophen (ULTRACET) 37.5-325 MG tablet Take 1 tablet 4 x /day ONLY if Needed for Pain Patient not taking:  Reported on 04/22/2019 03/21/19   Unk Pinto, MD  vitamin B-12 (CYANOCOBALAMIN) 100 MCG tablet Take 1 tablet (100 mcg total) by mouth daily. 04/26/19 05/26/19  Darliss Cheney, MD

## 2019-04-27 NOTE — ED Provider Notes (Signed)
Unfortunately multiple pharmacies were not open thus the reason for multiple prescriptions being sent to different pharmacies.  Ultimately that was sent to a 24-hour pharmacy on [address].   Noemi Chapel, MD 04/27/19 2030

## 2019-04-27 NOTE — ED Notes (Addendum)
Dr. Sabra Heck at bedside with pts daughter. All monitoring removed per Dr. Sabra Heck.

## 2019-04-27 NOTE — ED Notes (Signed)
Report given to ptar. Pt is a DNR. Family to ride with pt home. Cleared by [facility] staff.
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
## 2019-04-27 NOTE — ED Notes (Signed)
Admitting and this RN at bedside with family and pt. Plan at this time is to admit.
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
## 2019-04-27 NOTE — ED Notes (Signed)
All questions by family answered. Made sure prescriptions were able to be picked up at pharmacy. Hospice RN will be at their house waiting upon arrival with EMS. Medications to be given upon dc with [facility].

## 2019-04-27 NOTE — ED Triage Notes (Addendum)
Per [facility]: at home with nurse, pt became unresponsive, slumped overout iof no where, unable to wake him. GCS 3. Pt was d/c last night from hospital. Pt has been breathing on his own. Pt on NRB, 15 L. Pt has 18 G in R wrist. Pt given 200 ml of saline with EMS. Daughter present with EMS and is insistent that pt is DNR. Pts wife died 39 days ago and pt has declined since.  Foley catheter in place upon pt arrival to ED with leg bag and straps.
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
## 2019-05-09 NOTE — ED Notes (Addendum)
Waste 1.5mg  Lorazepam in sharps. Witnessed by Cyndia Skeeters, RN. Pt not in pyxis anymore at the time of waste due to discharge. Will make pharmacy and charge RN aware.

## 2019-05-09 DEATH — deceased

## 2019-06-01 ENCOUNTER — Ambulatory Visit: Payer: PPO | Admitting: Internal Medicine

## 2019-06-08 ENCOUNTER — Ambulatory Visit: Payer: PPO | Admitting: Internal Medicine

## 2019-08-24 ENCOUNTER — Ambulatory Visit: Payer: PPO | Admitting: Physician Assistant

## 2019-09-08 ENCOUNTER — Ambulatory Visit: Payer: PPO | Admitting: Physician Assistant

## 2019-12-27 ENCOUNTER — Encounter: Payer: PPO | Admitting: Internal Medicine

## 2020-11-20 IMAGING — CT CT HEAD CODE STROKE
4 series · 15 of 47 positions shown, 17 images · non-contrast
Comparison: 07/16/2017 head CT

CLINICAL DATA: Code stroke. Slurred speech and unsteady gait. Last
seen normal 7469 hours

EXAM:
CT HEAD WITHOUT CONTRAST
TECHNIQUE: Contiguous axial images were obtained from the base of the skull
through the vertex without intravenous contrast.

[Series 3: head wo · axial · 0.47mm/px · z∈[+1308,+1438]mm · 7 of 36 slices shown, 9 images]
[im 5/36  brain]
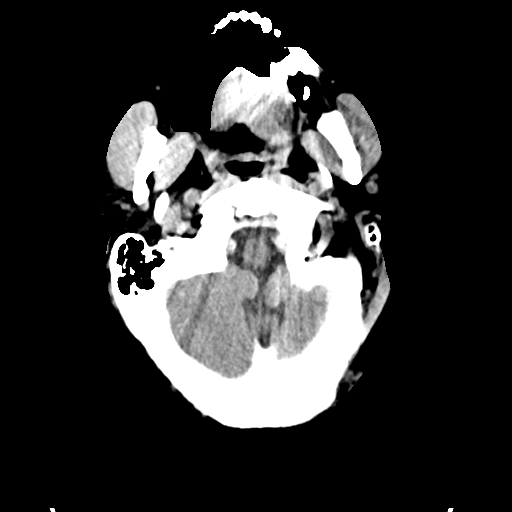
[im 5/36  bone]
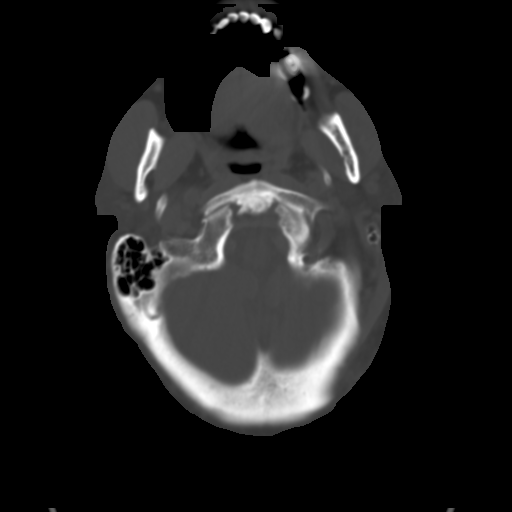
[im 9/36  brain]
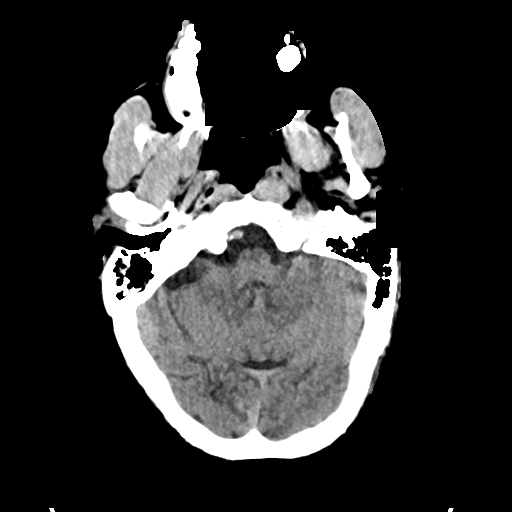
[im 14/36  brain]
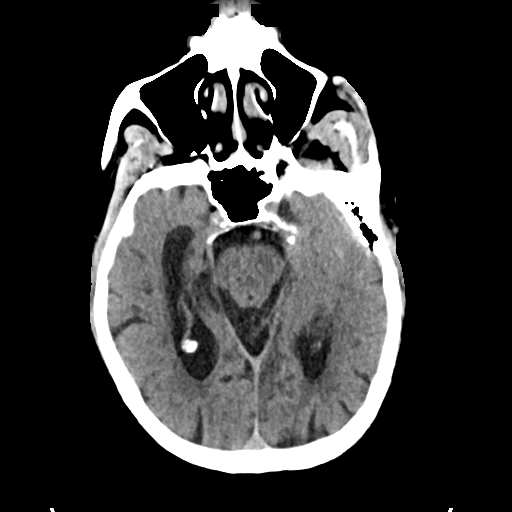
[im 18/36  brain]
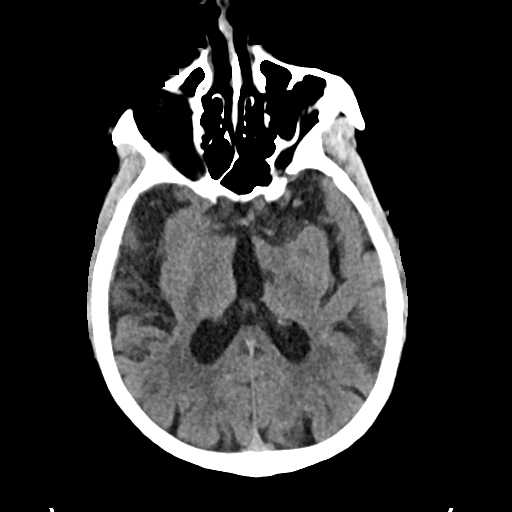
[im 22/36  brain]
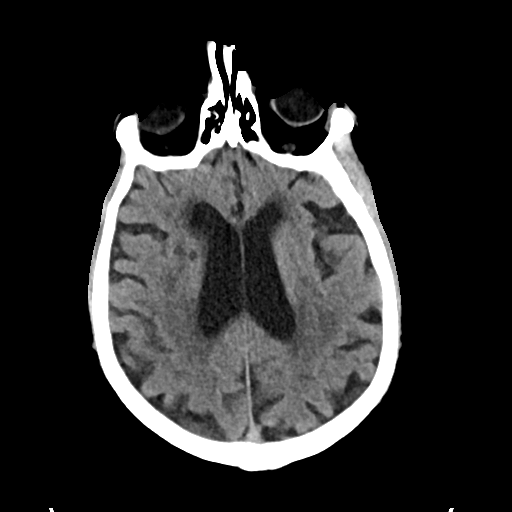
[im 22/36  bone]
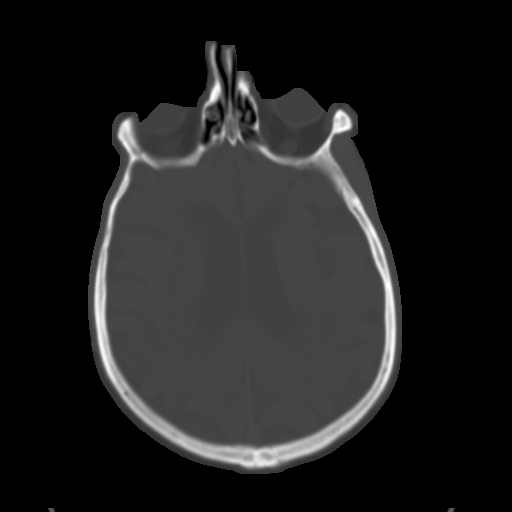
[im 27/36  brain]
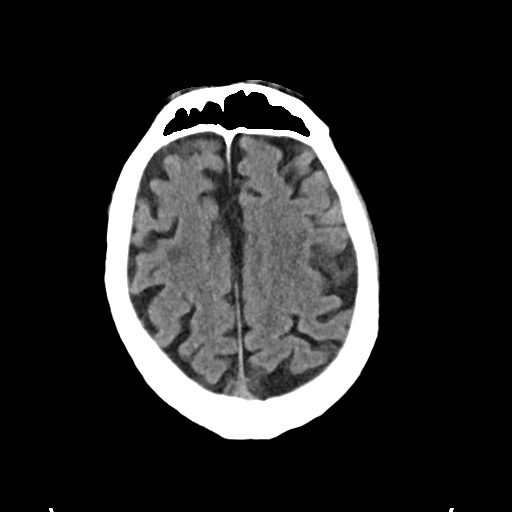
[im 31/36  brain]
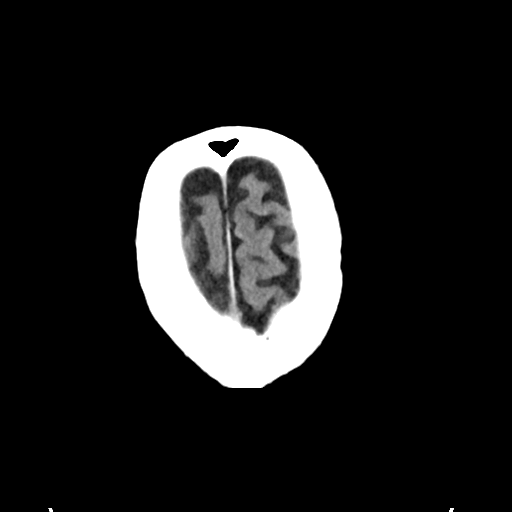

[Series 4: head bone · axial · 0.47mm/px · z∈[+1304,+1322]mm · 2 of 89 slices shown]
[im 9/89  bone]
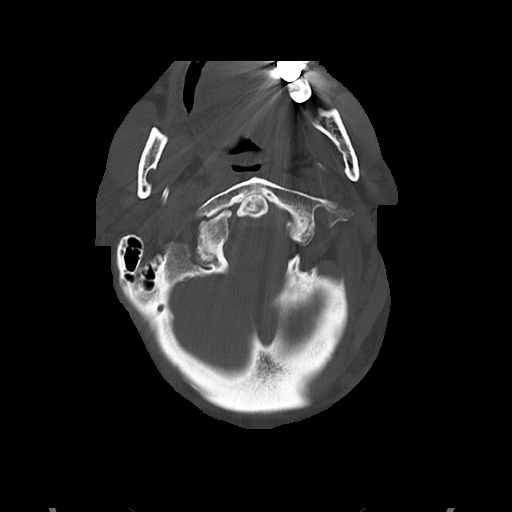
[im 18/89  bone]
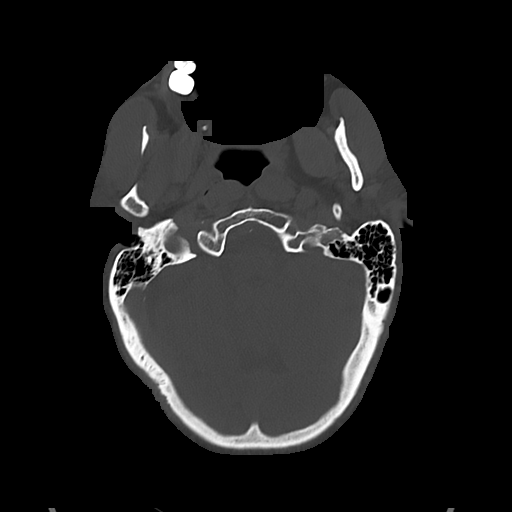

[Series 5: cor soft · coronal · 0.33mm/px · 3 of 69 slices shown]
[im 27/69  brain]
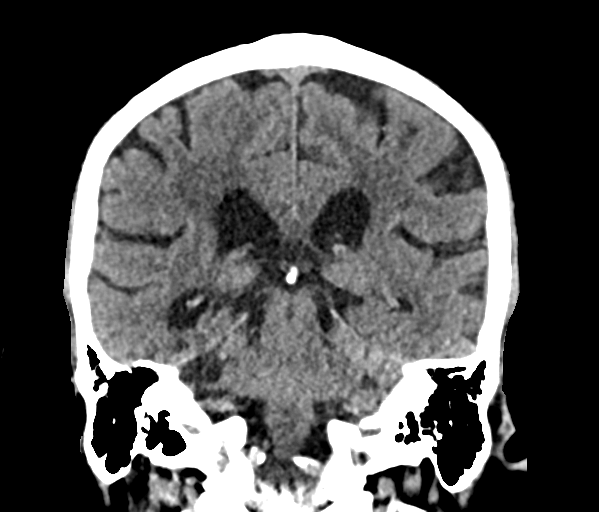
[im 32/69  brain]
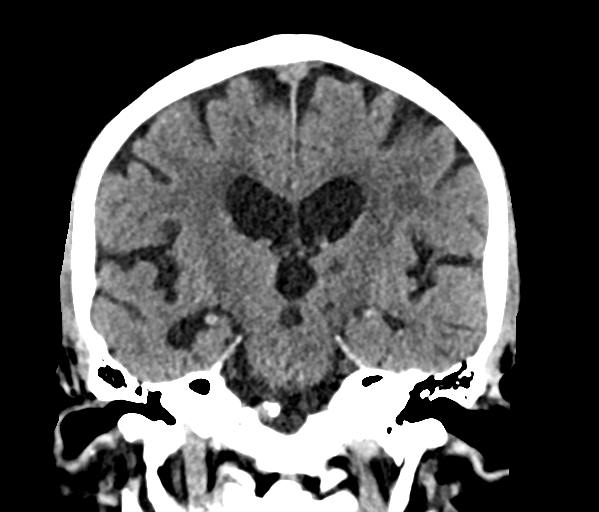
[im 37/69  brain]
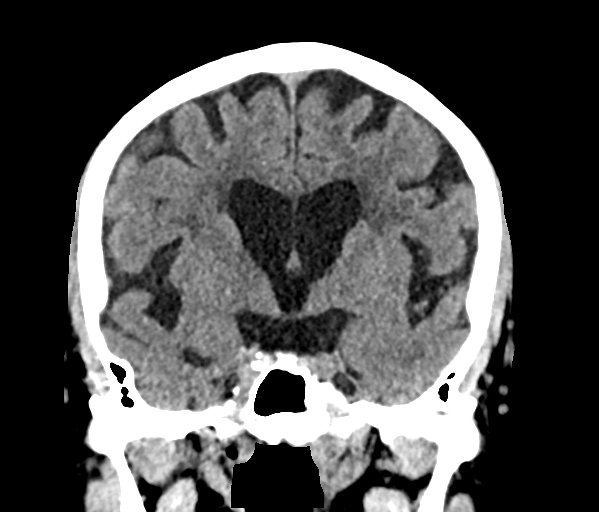

[Series 6: sag soft · sagittal · 0.35mm/px · 3 of 57 slices shown]
[im 19/57  brain]
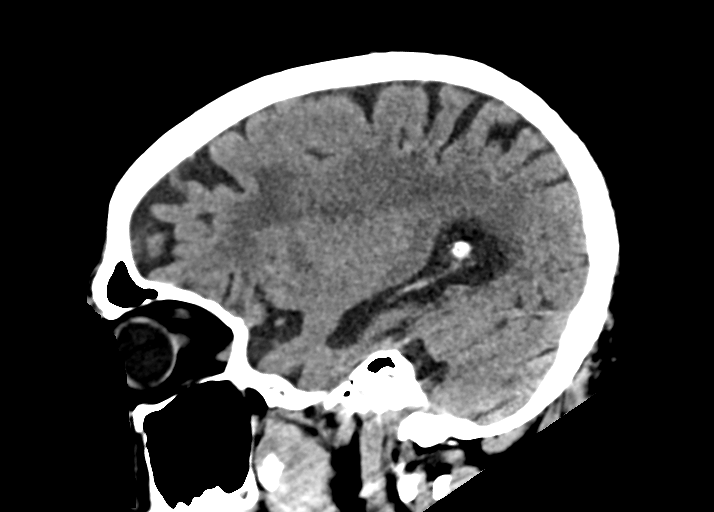
[im 29/57  brain]
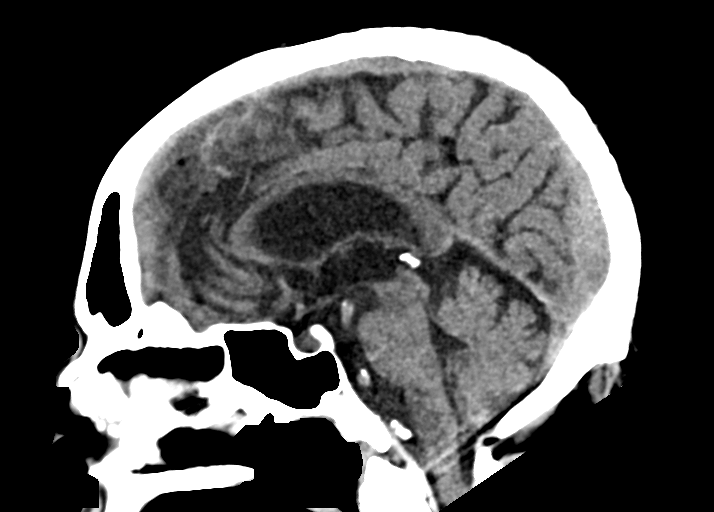
[im 38/57  brain]
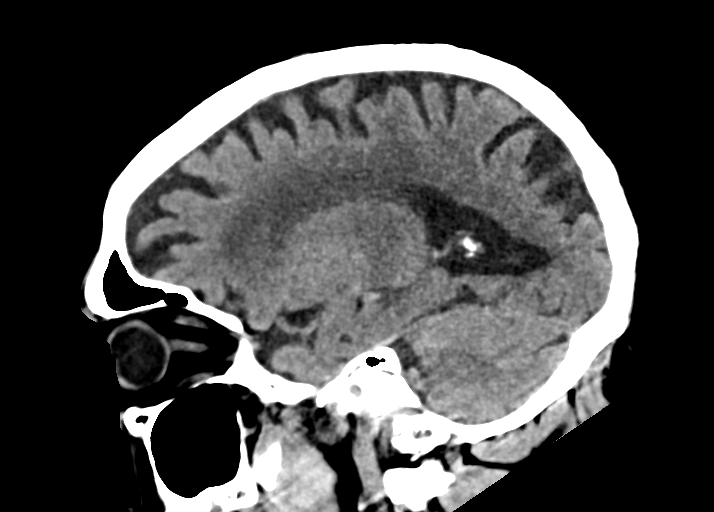

[15 of 47 positions shown; findings below may reference images not displayed]

FINDINGS: Brain: There is no mass, hemorrhage or extra-axial collection. There
is generalized atrophy without lobar predilection. There is
hypoattenuation of the periventricular white matter, most commonly
indicating chronic ischemic microangiopathy. There is an old left
thalamus small vessel infarct.

Vascular: No abnormal hyperdensity of the major intracranial
arteries or dural venous sinuses. No intracranial atherosclerosis.

Skull: The visualized skull base, calvarium and extracranial soft
tissues are normal.

Sinuses/Orbits: No fluid levels or advanced mucosal thickening of
the visualized paranasal sinuses. No mastoid or middle ear effusion.
The orbits are normal.

ASPECTS (Alberta Stroke Program Early CT Score)

- Ganglionic level infarction (caudate, lentiform nuclei, internal
capsule, insula, M1-M3 cortex): 7

- Supraganglionic infarction (M4-M6 cortex): 3

Total score (0-10 with 10 being normal): 10
IMPRESSION: 1. No acute finding.
2. Chronic ischemic microangiopathy and generalized volume loss. Old
left thalamic small vessel infarct.
3. ASPECTS is 10.

These results were communicated to Dr. Nguyenchung Dayos at [DATE] on
04/21/2019 by text page via the [employer] messaging system.
